# Patient Record
Sex: Female | Born: 1940 | Race: White | Hispanic: No | Marital: Married | Smoking: Former smoker
Health system: Southern US, Community
[De-identification: ages and names within clinical notes are randomized; demographics above are authoritative.]

## PROBLEM LIST (undated history)

## (undated) ENCOUNTER — Emergency Department (HOSPITAL_COMMUNITY): Payer: Medicare Other | Source: Home / Self Care

## (undated) DIAGNOSIS — J449 Chronic obstructive pulmonary disease, unspecified: Secondary | ICD-10-CM

## (undated) DIAGNOSIS — Z9889 Other specified postprocedural states: Secondary | ICD-10-CM

## (undated) DIAGNOSIS — S2239XA Fracture of one rib, unspecified side, initial encounter for closed fracture: Secondary | ICD-10-CM

## (undated) DIAGNOSIS — R112 Nausea with vomiting, unspecified: Secondary | ICD-10-CM

## (undated) DIAGNOSIS — K219 Gastro-esophageal reflux disease without esophagitis: Secondary | ICD-10-CM

## (undated) DIAGNOSIS — G459 Transient cerebral ischemic attack, unspecified: Secondary | ICD-10-CM

## (undated) DIAGNOSIS — I2510 Atherosclerotic heart disease of native coronary artery without angina pectoris: Secondary | ICD-10-CM

## (undated) DIAGNOSIS — S2249XA Multiple fractures of ribs, unspecified side, initial encounter for closed fracture: Secondary | ICD-10-CM

## (undated) DIAGNOSIS — M1990 Unspecified osteoarthritis, unspecified site: Secondary | ICD-10-CM

## (undated) DIAGNOSIS — I255 Ischemic cardiomyopathy: Secondary | ICD-10-CM

## (undated) DIAGNOSIS — IMO0002 Reserved for concepts with insufficient information to code with codable children: Secondary | ICD-10-CM

## (undated) DIAGNOSIS — B029 Zoster without complications: Secondary | ICD-10-CM

## (undated) DIAGNOSIS — M8580 Other specified disorders of bone density and structure, unspecified site: Secondary | ICD-10-CM

## (undated) DIAGNOSIS — I10 Essential (primary) hypertension: Secondary | ICD-10-CM

## (undated) DIAGNOSIS — I4901 Ventricular fibrillation: Secondary | ICD-10-CM

## (undated) DIAGNOSIS — E785 Hyperlipidemia, unspecified: Secondary | ICD-10-CM

## (undated) HISTORY — DX: Reserved for concepts with insufficient information to code with codable children: IMO0002

## (undated) HISTORY — PX: BACK SURGERY: SHX140

## (undated) HISTORY — PX: APPENDECTOMY: SHX54

## (undated) HISTORY — PX: LEG SURGERY: SHX1003

## (undated) HISTORY — DX: Transient cerebral ischemic attack, unspecified: G45.9

## (undated) HISTORY — DX: Atherosclerotic heart disease of native coronary artery without angina pectoris: I25.10

## (undated) HISTORY — PX: ORIF HIP FRACTURE: SHX2125

## (undated) HISTORY — DX: Gastro-esophageal reflux disease without esophagitis: K21.9

## (undated) HISTORY — DX: Zoster without complications: B02.9

## (undated) HISTORY — DX: Other specified disorders of bone density and structure, unspecified site: M85.80

## (undated) HISTORY — PX: TONSILLECTOMY: SHX5217

## (undated) HISTORY — DX: Unspecified osteoarthritis, unspecified site: M19.90

---

## 2003-11-14 LAB — HM COLONOSCOPY: HM Colonoscopy: NORMAL

## 2005-05-13 ENCOUNTER — Other Ambulatory Visit: Admission: RE | Admit: 2005-05-13 | Discharge: 2005-05-13 | Payer: Self-pay | Admitting: Obstetrics and Gynecology

## 2006-05-21 ENCOUNTER — Ambulatory Visit: Payer: Self-pay | Admitting: Family Medicine

## 2006-05-28 ENCOUNTER — Ambulatory Visit: Payer: Self-pay | Admitting: Family Medicine

## 2006-06-16 ENCOUNTER — Ambulatory Visit: Payer: Self-pay | Admitting: Family Medicine

## 2006-06-16 LAB — CONVERTED CEMR LAB
ALT: 20 units/L (ref 0–40)
AST: 19 units/L (ref 0–37)
Albumin: 3.5 g/dL (ref 3.5–5.2)
Alkaline Phosphatase: 67 units/L (ref 39–117)
BUN: 13 mg/dL (ref 6–23)
Basophils Absolute: 0 10*3/uL (ref 0.0–0.1)
Basophils Relative: 0.5 % (ref 0.0–1.0)
Bilirubin, Direct: 0.1 mg/dL (ref 0.0–0.3)
CO2: 33 meq/L — ABNORMAL HIGH (ref 19–32)
Calcium: 8.7 mg/dL (ref 8.4–10.5)
Chloride: 103 meq/L (ref 96–112)
Cholesterol: 203 mg/dL (ref 0–200)
Creatinine, Ser: 0.6 mg/dL (ref 0.4–1.2)
Direct LDL: 116.4 mg/dL
Eosinophils Absolute: 0 10*3/uL (ref 0.0–0.6)
Eosinophils Relative: 1.1 % (ref 0.0–5.0)
GFR calc Af Amer: 129 mL/min
GFR calc non Af Amer: 106 mL/min
Glucose, Bld: 106 mg/dL — ABNORMAL HIGH (ref 70–99)
HCT: 37.1 % (ref 36.0–46.0)
HDL: 71.5 mg/dL (ref >39.0)
Hemoglobin: 13 g/dL (ref 12.0–15.0)
Lymphocytes Relative: 27.8 % (ref 12.0–46.0)
MCHC: 35.2 g/dL (ref 30.0–36.0)
MCV: 97.3 fL (ref 78.0–100.0)
Monocytes Absolute: 0.5 10*3/uL (ref 0.2–0.7)
Monocytes Relative: 10.3 % (ref 3.0–11.0)
Neutro Abs: 2.7 10*3/uL (ref 1.4–7.7)
Neutrophils Relative %: 60.3 % (ref 43.0–77.0)
Platelets: 266 10*3/uL (ref 150–400)
Potassium: 4.8 meq/L (ref 3.5–5.1)
RBC: 3.81 M/uL — ABNORMAL LOW (ref 3.87–5.11)
RDW: 13.8 % (ref 11.5–14.6)
Sodium: 140 meq/L (ref 135–145)
TSH: 1.85 microintl units/mL (ref 0.35–5.50)
Total Bilirubin: 0.7 mg/dL (ref 0.3–1.2)
Total CHOL/HDL Ratio: 2.8
Total Protein: 6.3 g/dL (ref 6.0–8.3)
Triglycerides: 64 mg/dL (ref 0–149)
VLDL: 13 mg/dL (ref 0–40)
WBC: 4.5 10*3/uL (ref 4.5–10.5)

## 2007-03-29 ENCOUNTER — Emergency Department (HOSPITAL_COMMUNITY): Admission: EM | Admit: 2007-03-29 | Discharge: 2007-03-29 | Payer: Self-pay | Admitting: Family Medicine

## 2007-03-29 ENCOUNTER — Telehealth (INDEPENDENT_AMBULATORY_CARE_PROVIDER_SITE_OTHER): Payer: Self-pay | Admitting: *Deleted

## 2007-05-10 ENCOUNTER — Encounter: Admission: RE | Admit: 2007-05-10 | Discharge: 2007-05-10 | Payer: Self-pay | Admitting: Obstetrics and Gynecology

## 2007-09-01 ENCOUNTER — Emergency Department (HOSPITAL_COMMUNITY): Admission: EM | Admit: 2007-09-01 | Discharge: 2007-09-01 | Payer: Self-pay | Admitting: Family Medicine

## 2007-11-21 ENCOUNTER — Ambulatory Visit: Payer: Self-pay | Admitting: Family Medicine

## 2007-11-21 DIAGNOSIS — M949 Disorder of cartilage, unspecified: Secondary | ICD-10-CM

## 2007-11-21 DIAGNOSIS — M899 Disorder of bone, unspecified: Secondary | ICD-10-CM | POA: Insufficient documentation

## 2007-11-21 DIAGNOSIS — M1990 Unspecified osteoarthritis, unspecified site: Secondary | ICD-10-CM | POA: Insufficient documentation

## 2007-11-22 ENCOUNTER — Encounter (INDEPENDENT_AMBULATORY_CARE_PROVIDER_SITE_OTHER): Payer: Self-pay | Admitting: *Deleted

## 2007-11-22 LAB — CONVERTED CEMR LAB
ALT: 16 units/L (ref 0–35)
AST: 17 units/L (ref 0–37)
Albumin: 3.7 g/dL (ref 3.5–5.2)
Alkaline Phosphatase: 65 units/L (ref 39–117)
BUN: 9 mg/dL (ref 6–23)
Basophils Absolute: 0 10*3/uL (ref 0.0–0.1)
Basophils Relative: 0.5 % (ref 0.0–3.0)
Bilirubin, Direct: 0.1 mg/dL (ref 0.0–0.3)
CO2: 32 meq/L (ref 19–32)
Calcium: 8.8 mg/dL (ref 8.4–10.5)
Chloride: 103 meq/L (ref 96–112)
Cholesterol: 236 mg/dL (ref 0–200)
Creatinine, Ser: 0.7 mg/dL (ref 0.4–1.2)
Direct LDL: 134.9 mg/dL
Eosinophils Absolute: 0 10*3/uL (ref 0.0–0.7)
Eosinophils Relative: 0.6 % (ref 0.0–5.0)
GFR calc Af Amer: 107 mL/min
GFR calc non Af Amer: 88 mL/min
Glucose, Bld: 92 mg/dL (ref 70–99)
HCT: 37.5 % (ref 36.0–46.0)
HDL: 94.2 mg/dL (ref >39.0)
Hemoglobin: 12.8 g/dL (ref 12.0–15.0)
Lymphocytes Relative: 24 % (ref 12.0–46.0)
MCHC: 34.3 g/dL (ref 30.0–36.0)
MCV: 98.3 fL (ref 78.0–100.0)
Monocytes Absolute: 0.5 10*3/uL (ref 0.1–1.0)
Monocytes Relative: 8.8 % (ref 3.0–12.0)
Neutro Abs: 3.5 10*3/uL (ref 1.4–7.7)
Neutrophils Relative %: 66.1 % (ref 43.0–77.0)
Platelets: 342 10*3/uL (ref 150–400)
Potassium: 4.5 meq/L (ref 3.5–5.1)
RBC: 3.81 M/uL — ABNORMAL LOW (ref 3.87–5.11)
RDW: 14.4 % (ref 11.5–14.6)
Sodium: 138 meq/L (ref 135–145)
Total Bilirubin: 0.8 mg/dL (ref 0.3–1.2)
Total CHOL/HDL Ratio: 2.5
Total Protein: 7.1 g/dL (ref 6.0–8.3)
Triglycerides: 64 mg/dL (ref 0–149)
VLDL: 13 mg/dL (ref 0–40)
Vit D, 1,25-Dihydroxy: 30 (ref 30–89)
WBC: 5.2 10*3/uL (ref 4.5–10.5)

## 2008-03-09 ENCOUNTER — Ambulatory Visit: Payer: Self-pay | Admitting: Family Medicine

## 2008-03-09 DIAGNOSIS — E559 Vitamin D deficiency, unspecified: Secondary | ICD-10-CM | POA: Insufficient documentation

## 2008-03-09 DIAGNOSIS — K219 Gastro-esophageal reflux disease without esophagitis: Secondary | ICD-10-CM | POA: Insufficient documentation

## 2008-03-09 DIAGNOSIS — B354 Tinea corporis: Secondary | ICD-10-CM | POA: Insufficient documentation

## 2008-03-17 LAB — CONVERTED CEMR LAB: Vit D, 1,25-Dihydroxy: 32 (ref 30–89)

## 2008-03-20 ENCOUNTER — Encounter (INDEPENDENT_AMBULATORY_CARE_PROVIDER_SITE_OTHER): Payer: Self-pay | Admitting: *Deleted

## 2008-03-20 ENCOUNTER — Encounter: Payer: Self-pay | Admitting: Family Medicine

## 2008-04-17 ENCOUNTER — Telehealth (INDEPENDENT_AMBULATORY_CARE_PROVIDER_SITE_OTHER): Payer: Self-pay | Admitting: *Deleted

## 2008-04-18 ENCOUNTER — Ambulatory Visit: Payer: Self-pay | Admitting: Family Medicine

## 2008-04-20 ENCOUNTER — Encounter (INDEPENDENT_AMBULATORY_CARE_PROVIDER_SITE_OTHER): Payer: Self-pay | Admitting: *Deleted

## 2008-04-20 LAB — CONVERTED CEMR LAB: Vit D, 1,25-Dihydroxy: 82 (ref 30–89)

## 2008-05-03 ENCOUNTER — Ambulatory Visit: Payer: Self-pay | Admitting: Family Medicine

## 2008-05-03 DIAGNOSIS — K5289 Other specified noninfective gastroenteritis and colitis: Secondary | ICD-10-CM | POA: Insufficient documentation

## 2008-05-03 LAB — CONVERTED CEMR LAB
Bilirubin Urine: NEGATIVE
Blood in Urine, dipstick: NEGATIVE
Glucose, Urine, Semiquant: NEGATIVE
Ketones, urine, test strip: NEGATIVE
Nitrite: NEGATIVE
Protein, U semiquant: NEGATIVE
Specific Gravity, Urine: <1.005
Urobilinogen, UA: 0.2
WBC Urine, dipstick: NEGATIVE
pH: 5

## 2008-07-10 ENCOUNTER — Ambulatory Visit: Payer: Self-pay | Admitting: Family Medicine

## 2008-07-10 ENCOUNTER — Telehealth: Payer: Self-pay | Admitting: Internal Medicine

## 2008-07-10 DIAGNOSIS — R5383 Other fatigue: Secondary | ICD-10-CM

## 2008-07-10 DIAGNOSIS — R5381 Other malaise: Secondary | ICD-10-CM | POA: Insufficient documentation

## 2008-07-10 DIAGNOSIS — J069 Acute upper respiratory infection, unspecified: Secondary | ICD-10-CM | POA: Insufficient documentation

## 2008-07-10 DIAGNOSIS — R0602 Shortness of breath: Secondary | ICD-10-CM | POA: Insufficient documentation

## 2008-07-11 ENCOUNTER — Encounter: Payer: Self-pay | Admitting: Family Medicine

## 2008-07-11 ENCOUNTER — Telehealth (INDEPENDENT_AMBULATORY_CARE_PROVIDER_SITE_OTHER): Payer: Self-pay | Admitting: *Deleted

## 2008-07-11 ENCOUNTER — Ambulatory Visit: Payer: Self-pay | Admitting: Cardiology

## 2008-07-11 DIAGNOSIS — R1900 Intra-abdominal and pelvic swelling, mass and lump, unspecified site: Secondary | ICD-10-CM | POA: Insufficient documentation

## 2008-07-13 ENCOUNTER — Telehealth (INDEPENDENT_AMBULATORY_CARE_PROVIDER_SITE_OTHER): Payer: Self-pay | Admitting: *Deleted

## 2008-07-13 ENCOUNTER — Encounter: Admission: RE | Admit: 2008-07-13 | Discharge: 2008-07-13 | Payer: Self-pay | Admitting: Family Medicine

## 2008-07-27 LAB — CONVERTED CEMR LAB
ALT: 16 units/L (ref 0–35)
AST: 22 units/L (ref 0–37)
Albumin: 3.7 g/dL (ref 3.5–5.2)
Alkaline Phosphatase: 70 units/L (ref 39–117)
BUN: 17 mg/dL (ref 6–23)
Basophils Absolute: 0.1 10*3/uL (ref 0.0–0.1)
Basophils Relative: 2.1 % (ref 0.0–3.0)
Bilirubin, Direct: 0.1 mg/dL (ref 0.0–0.3)
CO2: 31 meq/L (ref 19–32)
Calcium: 9.2 mg/dL (ref 8.4–10.5)
Chloride: 101 meq/L (ref 96–112)
Creatinine, Ser: 0.6 mg/dL (ref 0.4–1.2)
Eosinophils Absolute: 0 10*3/uL (ref 0.0–0.7)
Eosinophils Relative: 0.8 % (ref 0.0–5.0)
Folate: 8.1 ng/mL
GFR calc non Af Amer: 105.43 mL/min (ref >60)
Glucose, Bld: 150 mg/dL — ABNORMAL HIGH (ref 70–99)
HCT: 34.8 % — ABNORMAL LOW (ref 36.0–46.0)
Hemoglobin: 11.6 g/dL — ABNORMAL LOW (ref 12.0–15.0)
Lymphocytes Relative: 32.7 % (ref 12.0–46.0)
Lymphs Abs: 1.8 10*3/uL (ref 0.7–4.0)
MCHC: 33.2 g/dL (ref 30.0–36.0)
MCV: 87.3 fL (ref 78.0–100.0)
Monocytes Absolute: 0.3 10*3/uL (ref 0.1–1.0)
Monocytes Relative: 6 % (ref 3.0–12.0)
Neutro Abs: 3.4 10*3/uL (ref 1.4–7.7)
Neutrophils Relative %: 58.4 % (ref 43.0–77.0)
Platelets: 339 10*3/uL (ref 150.0–400.0)
Potassium: 4.8 meq/L (ref 3.5–5.1)
RBC: 3.99 M/uL (ref 3.87–5.11)
RDW: 17.3 % — ABNORMAL HIGH (ref 11.5–14.6)
Sodium: 138 meq/L (ref 135–145)
TSH: 1.29 microintl units/mL (ref 0.35–5.50)
Total Bilirubin: 0.7 mg/dL (ref 0.3–1.2)
Total Protein: 6.8 g/dL (ref 6.0–8.3)
Vitamin B-12: 346 pg/mL (ref 211–911)
WBC: 5.6 10*3/uL (ref 4.5–10.5)

## 2008-07-30 ENCOUNTER — Encounter (INDEPENDENT_AMBULATORY_CARE_PROVIDER_SITE_OTHER): Payer: Self-pay | Admitting: *Deleted

## 2008-08-01 ENCOUNTER — Telehealth (INDEPENDENT_AMBULATORY_CARE_PROVIDER_SITE_OTHER): Payer: Self-pay | Admitting: *Deleted

## 2008-08-03 ENCOUNTER — Ambulatory Visit: Payer: Self-pay | Admitting: Family Medicine

## 2008-08-05 LAB — CONVERTED CEMR LAB
Basophils Absolute: 0 10*3/uL (ref 0.0–0.1)
Basophils Relative: 0.4 % (ref 0.0–3.0)
Eosinophils Absolute: 0 10*3/uL (ref 0.0–0.7)
Eosinophils Relative: 0.7 % (ref 0.0–5.0)
Ferritin: 7.8 ng/mL — ABNORMAL LOW (ref 10.0–291.0)
HCT: 33.9 % — ABNORMAL LOW (ref 36.0–46.0)
Hemoglobin: 11.5 g/dL — ABNORMAL LOW (ref 12.0–15.0)
Hgb A1c MFr Bld: 5.8 % (ref 4.6–6.5)
Iron: 52 ug/dL (ref 42–145)
Lymphocytes Relative: 25.8 % (ref 12.0–46.0)
Lymphs Abs: 1.2 10*3/uL (ref 0.7–4.0)
MCHC: 33.9 g/dL (ref 30.0–36.0)
MCV: 87.6 fL (ref 78.0–100.0)
Monocytes Absolute: 0.4 10*3/uL (ref 0.1–1.0)
Monocytes Relative: 8.5 % (ref 3.0–12.0)
Neutro Abs: 3 10*3/uL (ref 1.4–7.7)
Neutrophils Relative %: 64.6 % (ref 43.0–77.0)
Platelets: 268 10*3/uL (ref 150.0–400.0)
RBC: 3.87 M/uL (ref 3.87–5.11)
RDW: 16.7 % — ABNORMAL HIGH (ref 11.5–14.6)
Saturation Ratios: 12.6 % — ABNORMAL LOW (ref 20.0–50.0)
Transferrin: 295.5 mg/dL (ref 212.0–360.0)
WBC: 4.6 10*3/uL (ref 4.5–10.5)

## 2008-08-07 ENCOUNTER — Encounter (INDEPENDENT_AMBULATORY_CARE_PROVIDER_SITE_OTHER): Payer: Self-pay | Admitting: *Deleted

## 2008-08-30 ENCOUNTER — Telehealth (INDEPENDENT_AMBULATORY_CARE_PROVIDER_SITE_OTHER): Payer: Self-pay | Admitting: *Deleted

## 2008-08-30 ENCOUNTER — Telehealth: Payer: Self-pay | Admitting: Family Medicine

## 2008-08-30 ENCOUNTER — Ambulatory Visit: Payer: Self-pay | Admitting: Family Medicine

## 2008-08-30 ENCOUNTER — Encounter (INDEPENDENT_AMBULATORY_CARE_PROVIDER_SITE_OTHER): Payer: Self-pay | Admitting: *Deleted

## 2008-08-30 DIAGNOSIS — IMO0002 Reserved for concepts with insufficient information to code with codable children: Secondary | ICD-10-CM | POA: Insufficient documentation

## 2008-08-30 DIAGNOSIS — D539 Nutritional anemia, unspecified: Secondary | ICD-10-CM | POA: Insufficient documentation

## 2008-08-30 LAB — CONVERTED CEMR LAB
Basophils Absolute: 0 10*3/uL (ref 0.0–0.1)
Basophils Relative: 0.1 % (ref 0.0–3.0)
Eosinophils Absolute: 0 10*3/uL (ref 0.0–0.7)
Eosinophils Relative: 0.5 % (ref 0.0–5.0)
HCT: 35.5 % — ABNORMAL LOW (ref 36.0–46.0)
Hemoglobin: 12.2 g/dL (ref 12.0–15.0)
Lymphocytes Relative: 20.6 % (ref 12.0–46.0)
Lymphs Abs: 1.1 10*3/uL (ref 0.7–4.0)
MCHC: 34.4 g/dL (ref 30.0–36.0)
MCV: 89 fL (ref 78.0–100.0)
Monocytes Absolute: 0.5 10*3/uL (ref 0.1–1.0)
Monocytes Relative: 8.6 % (ref 3.0–12.0)
Neutro Abs: 3.7 10*3/uL (ref 1.4–7.7)
Neutrophils Relative %: 70.2 % (ref 43.0–77.0)
Platelets: 268 10*3/uL (ref 150.0–400.0)
RBC: 3.98 M/uL (ref 3.87–5.11)
RDW: 17 % — ABNORMAL HIGH (ref 11.5–14.6)
WBC: 5.3 10*3/uL (ref 4.5–10.5)

## 2008-08-31 ENCOUNTER — Encounter: Admission: RE | Admit: 2008-08-31 | Discharge: 2008-08-31 | Payer: Self-pay | Admitting: Family Medicine

## 2008-08-31 ENCOUNTER — Encounter (INDEPENDENT_AMBULATORY_CARE_PROVIDER_SITE_OTHER): Payer: Self-pay | Admitting: *Deleted

## 2008-08-31 DIAGNOSIS — M48061 Spinal stenosis, lumbar region without neurogenic claudication: Secondary | ICD-10-CM | POA: Insufficient documentation

## 2008-08-31 LAB — CONVERTED CEMR LAB: Vit D, 25-Hydroxy: 48 ng/mL (ref 30–89)

## 2008-09-04 ENCOUNTER — Telehealth (INDEPENDENT_AMBULATORY_CARE_PROVIDER_SITE_OTHER): Payer: Self-pay | Admitting: *Deleted

## 2008-10-22 ENCOUNTER — Telehealth: Payer: Self-pay | Admitting: Family Medicine

## 2008-10-25 ENCOUNTER — Ambulatory Visit: Payer: Self-pay | Admitting: Family Medicine

## 2008-10-30 ENCOUNTER — Encounter: Payer: Self-pay | Admitting: Family Medicine

## 2008-12-17 ENCOUNTER — Telehealth: Payer: Self-pay | Admitting: Family Medicine

## 2008-12-18 ENCOUNTER — Ambulatory Visit: Payer: Self-pay | Admitting: Family Medicine

## 2008-12-19 ENCOUNTER — Telehealth: Payer: Self-pay | Admitting: Family Medicine

## 2008-12-20 ENCOUNTER — Emergency Department (HOSPITAL_COMMUNITY): Admission: EM | Admit: 2008-12-20 | Discharge: 2008-12-20 | Payer: Self-pay | Admitting: Emergency Medicine

## 2008-12-20 ENCOUNTER — Encounter: Payer: Self-pay | Admitting: Family Medicine

## 2008-12-21 ENCOUNTER — Telehealth: Payer: Self-pay | Admitting: Family Medicine

## 2008-12-21 DIAGNOSIS — S32009A Unspecified fracture of unspecified lumbar vertebra, initial encounter for closed fracture: Secondary | ICD-10-CM | POA: Insufficient documentation

## 2008-12-25 ENCOUNTER — Encounter: Admission: RE | Admit: 2008-12-25 | Discharge: 2008-12-25 | Payer: Self-pay | Admitting: Family Medicine

## 2008-12-26 ENCOUNTER — Telehealth: Payer: Self-pay | Admitting: Family Medicine

## 2009-01-04 ENCOUNTER — Ambulatory Visit (HOSPITAL_COMMUNITY): Admission: RE | Admit: 2009-01-04 | Discharge: 2009-01-05 | Payer: Self-pay | Admitting: Neurosurgery

## 2009-04-10 ENCOUNTER — Ambulatory Visit: Payer: Self-pay | Admitting: Family

## 2009-04-10 DIAGNOSIS — J329 Chronic sinusitis, unspecified: Secondary | ICD-10-CM | POA: Insufficient documentation

## 2009-04-23 ENCOUNTER — Encounter: Admission: RE | Admit: 2009-04-23 | Discharge: 2009-04-23 | Payer: Self-pay | Admitting: Neurosurgery

## 2009-04-30 ENCOUNTER — Telehealth (INDEPENDENT_AMBULATORY_CARE_PROVIDER_SITE_OTHER): Payer: Self-pay | Admitting: *Deleted

## 2009-05-30 ENCOUNTER — Inpatient Hospital Stay (HOSPITAL_COMMUNITY): Admission: RE | Admit: 2009-05-30 | Discharge: 2009-05-31 | Payer: Self-pay | Admitting: Neurosurgery

## 2009-07-16 ENCOUNTER — Encounter: Payer: Self-pay | Admitting: Family Medicine

## 2009-08-14 ENCOUNTER — Ambulatory Visit: Payer: Self-pay | Admitting: Cardiology

## 2009-08-14 ENCOUNTER — Inpatient Hospital Stay (HOSPITAL_COMMUNITY): Admission: EM | Admit: 2009-08-14 | Discharge: 2009-08-19 | Payer: Self-pay | Admitting: Emergency Medicine

## 2009-08-14 ENCOUNTER — Telehealth: Payer: Self-pay | Admitting: Family Medicine

## 2009-08-14 DIAGNOSIS — I2510 Atherosclerotic heart disease of native coronary artery without angina pectoris: Secondary | ICD-10-CM

## 2009-08-14 DIAGNOSIS — I4901 Ventricular fibrillation: Secondary | ICD-10-CM | POA: Insufficient documentation

## 2009-08-14 HISTORY — DX: Atherosclerotic heart disease of native coronary artery without angina pectoris: I25.10

## 2009-08-16 ENCOUNTER — Encounter: Payer: Self-pay | Admitting: Cardiology

## 2009-08-22 ENCOUNTER — Inpatient Hospital Stay (HOSPITAL_COMMUNITY): Admission: EM | Admit: 2009-08-22 | Discharge: 2009-08-23 | Payer: Self-pay | Admitting: Emergency Medicine

## 2009-08-22 ENCOUNTER — Telehealth: Payer: Self-pay | Admitting: Internal Medicine

## 2009-08-22 ENCOUNTER — Ambulatory Visit: Payer: Self-pay | Admitting: Cardiology

## 2009-08-23 ENCOUNTER — Encounter: Payer: Self-pay | Admitting: Cardiology

## 2009-08-27 ENCOUNTER — Ambulatory Visit: Payer: Self-pay | Admitting: Family Medicine

## 2009-08-27 DIAGNOSIS — I219 Acute myocardial infarction, unspecified: Secondary | ICD-10-CM | POA: Insufficient documentation

## 2009-08-27 DIAGNOSIS — E785 Hyperlipidemia, unspecified: Secondary | ICD-10-CM | POA: Insufficient documentation

## 2009-08-27 DIAGNOSIS — E876 Hypokalemia: Secondary | ICD-10-CM | POA: Insufficient documentation

## 2009-08-27 DIAGNOSIS — I2510 Atherosclerotic heart disease of native coronary artery without angina pectoris: Secondary | ICD-10-CM | POA: Insufficient documentation

## 2009-08-29 ENCOUNTER — Telehealth: Payer: Self-pay | Admitting: Cardiology

## 2009-08-30 LAB — CONVERTED CEMR LAB
BUN: 13 mg/dL (ref 6–23)
CO2: 30 meq/L (ref 19–32)
Calcium: 9.5 mg/dL (ref 8.4–10.5)
Chloride: 91 meq/L — ABNORMAL LOW (ref 96–112)
Creatinine, Ser: 0.6 mg/dL (ref 0.4–1.2)
GFR calc non Af Amer: 103.09 mL/min (ref >60)
Glucose, Bld: 94 mg/dL (ref 70–99)
Potassium: 5 meq/L (ref 3.5–5.1)
Sodium: 130 meq/L — ABNORMAL LOW (ref 135–145)

## 2009-09-04 ENCOUNTER — Ambulatory Visit: Payer: Self-pay | Admitting: Cardiology

## 2009-09-04 DIAGNOSIS — E871 Hypo-osmolality and hyponatremia: Secondary | ICD-10-CM | POA: Insufficient documentation

## 2009-09-05 ENCOUNTER — Encounter (HOSPITAL_COMMUNITY): Admission: RE | Admit: 2009-09-05 | Discharge: 2009-12-04 | Payer: Self-pay | Admitting: Cardiology

## 2009-09-05 LAB — CONVERTED CEMR LAB
BUN: 9 mg/dL (ref 6–23)
Basophils Absolute: 0 10*3/uL (ref 0.0–0.1)
Basophils Relative: 0.4 % (ref 0.0–3.0)
CO2: 32 meq/L (ref 19–32)
Calcium: 9 mg/dL (ref 8.4–10.5)
Chloride: 93 meq/L — ABNORMAL LOW (ref 96–112)
Creatinine, Ser: 0.6 mg/dL (ref 0.4–1.2)
Eosinophils Absolute: 0 10*3/uL (ref 0.0–0.7)
Eosinophils Relative: 0.4 % (ref 0.0–5.0)
GFR calc non Af Amer: 116.17 mL/min (ref >60)
Glucose, Bld: 83 mg/dL (ref 70–99)
HCT: 35.7 % — ABNORMAL LOW (ref 36.0–46.0)
Hemoglobin: 12.6 g/dL (ref 12.0–15.0)
Lymphocytes Relative: 21.1 % (ref 12.0–46.0)
Lymphs Abs: 1.3 10*3/uL (ref 0.7–4.0)
MCHC: 35.2 g/dL (ref 30.0–36.0)
MCV: 101.6 fL — ABNORMAL HIGH (ref 78.0–100.0)
Monocytes Absolute: 0.5 10*3/uL (ref 0.1–1.0)
Monocytes Relative: 8.8 % (ref 3.0–12.0)
Neutro Abs: 4.1 10*3/uL (ref 1.4–7.7)
Neutrophils Relative %: 69.3 % (ref 43.0–77.0)
Platelets: 339 10*3/uL (ref 150.0–400.0)
Potassium: 5.5 meq/L — ABNORMAL HIGH (ref 3.5–5.1)
RBC: 3.51 M/uL — ABNORMAL LOW (ref 3.87–5.11)
RDW: 13.6 % (ref 11.5–14.6)
Sodium: 132 meq/L — ABNORMAL LOW (ref 135–145)
WBC: 6 10*3/uL (ref 4.5–10.5)

## 2009-09-09 ENCOUNTER — Ambulatory Visit: Payer: Self-pay | Admitting: Cardiology

## 2009-09-10 ENCOUNTER — Encounter: Payer: Self-pay | Admitting: Family Medicine

## 2009-09-10 LAB — CONVERTED CEMR LAB
BUN: 12 mg/dL (ref 6–23)
CO2: 32 meq/L (ref 19–32)
Calcium: 9.3 mg/dL (ref 8.4–10.5)
Chloride: 95 meq/L — ABNORMAL LOW (ref 96–112)
Creatinine, Ser: 0.7 mg/dL (ref 0.4–1.2)
GFR calc non Af Amer: 92.51 mL/min (ref >60)
Glucose, Bld: 83 mg/dL (ref 70–99)
Potassium: 5 meq/L (ref 3.5–5.1)
Sodium: 134 meq/L — ABNORMAL LOW (ref 135–145)

## 2009-09-16 ENCOUNTER — Ambulatory Visit: Payer: Self-pay | Admitting: Cardiology

## 2009-09-19 ENCOUNTER — Encounter: Payer: Self-pay | Admitting: Cardiology

## 2009-09-23 ENCOUNTER — Telehealth: Payer: Self-pay | Admitting: Cardiology

## 2009-09-30 LAB — CONVERTED CEMR LAB
BUN: 10 mg/dL (ref 6–23)
CO2: 28 meq/L (ref 19–32)
Calcium: 9.1 mg/dL (ref 8.4–10.5)
Chloride: 100 meq/L (ref 96–112)
Creatinine, Ser: 0.7 mg/dL (ref 0.4–1.2)
GFR calc non Af Amer: 92.5 mL/min (ref >60)
Glucose, Bld: 80 mg/dL (ref 70–99)
Potassium: 4.9 meq/L (ref 3.5–5.1)
Sodium: 137 meq/L (ref 135–145)

## 2009-10-02 ENCOUNTER — Encounter: Admission: RE | Admit: 2009-10-02 | Discharge: 2009-10-02 | Payer: Self-pay | Admitting: Neurosurgery

## 2009-10-03 ENCOUNTER — Encounter: Payer: Self-pay | Admitting: Family Medicine

## 2009-10-03 ENCOUNTER — Encounter: Payer: Self-pay | Admitting: Cardiology

## 2009-10-09 ENCOUNTER — Encounter: Payer: Self-pay | Admitting: Cardiology

## 2009-10-09 ENCOUNTER — Ambulatory Visit (HOSPITAL_COMMUNITY): Admission: RE | Admit: 2009-10-09 | Discharge: 2009-10-09 | Payer: Self-pay | Admitting: Cardiology

## 2009-10-09 ENCOUNTER — Ambulatory Visit: Payer: Self-pay | Admitting: Cardiology

## 2009-10-09 ENCOUNTER — Ambulatory Visit: Payer: Self-pay

## 2009-10-10 ENCOUNTER — Encounter: Payer: Self-pay | Admitting: Cardiology

## 2009-10-10 LAB — CONVERTED CEMR LAB
ALT: 15 units/L (ref 0–35)
AST: 21 units/L (ref 0–37)
Albumin: 3.7 g/dL (ref 3.5–5.2)
Alkaline Phosphatase: 80 units/L (ref 39–117)
BUN: 10 mg/dL (ref 6–23)
Bilirubin, Direct: 0 mg/dL (ref 0.0–0.3)
CO2: 27 meq/L (ref 19–32)
Calcium: 8.6 mg/dL (ref 8.4–10.5)
Chloride: 100 meq/L (ref 96–112)
Cholesterol: 143 mg/dL (ref 0–200)
Creatinine, Ser: 0.6 mg/dL (ref 0.4–1.2)
GFR calc non Af Amer: 109.23 mL/min (ref >60)
Glucose, Bld: 101 mg/dL — ABNORMAL HIGH (ref 70–99)
HDL: 82.9 mg/dL (ref >39.00)
LDL Cholesterol: 42 mg/dL (ref 0–99)
Potassium: 4.4 meq/L (ref 3.5–5.1)
Sodium: 134 meq/L — ABNORMAL LOW (ref 135–145)
Total Bilirubin: 0.5 mg/dL (ref 0.3–1.2)
Total CHOL/HDL Ratio: 2
Total Protein: 6.7 g/dL (ref 6.0–8.3)
Triglycerides: 93 mg/dL (ref 0.0–149.0)
VLDL: 18.6 mg/dL (ref 0.0–40.0)

## 2009-10-16 ENCOUNTER — Encounter: Payer: Self-pay | Admitting: Family Medicine

## 2009-10-16 LAB — HM MAMMOGRAPHY: HM Mammogram: NORMAL

## 2009-10-16 LAB — CONVERTED CEMR LAB: Pap Smear: NORMAL

## 2009-10-16 LAB — HM PAP SMEAR: HM Pap smear: NORMAL

## 2009-10-18 ENCOUNTER — Telehealth: Payer: Self-pay | Admitting: Cardiology

## 2009-12-02 ENCOUNTER — Telehealth: Payer: Self-pay | Admitting: Cardiology

## 2009-12-05 ENCOUNTER — Encounter (HOSPITAL_COMMUNITY): Admission: RE | Admit: 2009-12-05 | Discharge: 2010-01-03 | Payer: Self-pay | Admitting: Cardiology

## 2009-12-14 ENCOUNTER — Inpatient Hospital Stay (HOSPITAL_COMMUNITY): Admission: EM | Admit: 2009-12-14 | Discharge: 2009-12-16 | Payer: Self-pay | Admitting: Emergency Medicine

## 2009-12-14 ENCOUNTER — Emergency Department (HOSPITAL_COMMUNITY)
Admission: EM | Admit: 2009-12-14 | Discharge: 2009-12-14 | Disposition: A | Discharge status: Other Acute Inpatient Hospital | Payer: Self-pay | Source: Home / Self Care | Admitting: Emergency Medicine

## 2009-12-14 ENCOUNTER — Ambulatory Visit: Payer: Self-pay | Admitting: Internal Medicine

## 2009-12-17 ENCOUNTER — Encounter: Payer: Self-pay | Admitting: Cardiology

## 2009-12-18 ENCOUNTER — Telehealth: Payer: Self-pay | Admitting: Cardiology

## 2009-12-23 ENCOUNTER — Telehealth: Payer: Self-pay | Admitting: Cardiology

## 2009-12-24 ENCOUNTER — Telehealth (INDEPENDENT_AMBULATORY_CARE_PROVIDER_SITE_OTHER): Payer: Self-pay | Admitting: *Deleted

## 2009-12-25 ENCOUNTER — Encounter: Payer: Self-pay | Admitting: Cardiology

## 2009-12-25 ENCOUNTER — Ambulatory Visit: Payer: Self-pay

## 2009-12-25 ENCOUNTER — Encounter (HOSPITAL_COMMUNITY): Admission: RE | Admit: 2009-12-25 | Discharge: 2010-03-07 | Payer: Self-pay | Admitting: Cardiology

## 2009-12-25 ENCOUNTER — Ambulatory Visit: Payer: Self-pay | Admitting: Cardiology

## 2009-12-30 ENCOUNTER — Telehealth: Payer: Self-pay | Admitting: Cardiology

## 2010-01-21 ENCOUNTER — Ambulatory Visit: Payer: Self-pay | Admitting: Cardiology

## 2010-02-25 ENCOUNTER — Encounter: Payer: Self-pay | Admitting: Family Medicine

## 2010-02-25 ENCOUNTER — Encounter: Payer: Self-pay | Admitting: Cardiology

## 2010-03-05 ENCOUNTER — Ambulatory Visit: Payer: Self-pay | Admitting: Cardiology

## 2010-03-11 LAB — CONVERTED CEMR LAB
ALT: 19 U/L (ref 0–35)
AST: 25 U/L (ref 0–37)
Albumin: 3.7 g/dL (ref 3.5–5.2)
Alkaline Phosphatase: 76 U/L (ref 39–117)
BUN: 16 mg/dL (ref 6–23)
Bilirubin, Direct: 0.1 mg/dL (ref 0.0–0.3)
CO2: 31 meq/L (ref 19–32)
Calcium: 9 mg/dL (ref 8.4–10.5)
Chloride: 99 meq/L (ref 96–112)
Cholesterol: 166 mg/dL (ref 0–200)
Creatinine, Ser: 0.7 mg/dL (ref 0.4–1.2)
GFR calc non Af Amer: 85.01 mL/min (ref >60)
Glucose, Bld: 70 mg/dL (ref 70–99)
HDL: 92.7 mg/dL (ref >39.00)
LDL Cholesterol: 62 mg/dL (ref 0–99)
Potassium: 4.6 meq/L (ref 3.5–5.1)
Sodium: 137 meq/L (ref 135–145)
Total Bilirubin: 0.4 mg/dL (ref 0.3–1.2)
Total CHOL/HDL Ratio: 2
Total Protein: 6.7 g/dL (ref 6.0–8.3)
Triglycerides: 59 mg/dL (ref 0.0–149.0)
VLDL: 11.8 mg/dL (ref 0.0–40.0)

## 2010-03-25 ENCOUNTER — Ambulatory Visit: Payer: Self-pay | Admitting: Cardiology

## 2010-04-10 ENCOUNTER — Inpatient Hospital Stay (HOSPITAL_COMMUNITY)
Admission: EM | Admit: 2010-04-10 | Discharge: 2010-04-11 | Payer: Self-pay | Source: Home / Self Care | Attending: Cardiology | Admitting: Cardiology

## 2010-04-10 LAB — DIFFERENTIAL
Basophils Absolute: 0 10*3/uL (ref 0.0–0.1)
Basophils Relative: 0 % (ref 0–1)
Eosinophils Absolute: 0 10*3/uL (ref 0.0–0.7)
Eosinophils Relative: 0 % (ref 0–5)
Lymphocytes Relative: 20 % (ref 12–46)
Lymphs Abs: 1.1 10*3/uL (ref 0.7–4.0)
Monocytes Absolute: 0.4 10*3/uL (ref 0.1–1.0)
Monocytes Relative: 7 % (ref 3–12)
Neutro Abs: 3.8 10*3/uL (ref 1.7–7.7)
Neutrophils Relative %: 72 % (ref 43–77)

## 2010-04-10 LAB — PROTIME-INR
INR: 0.94 (ref 0.00–1.49)
Prothrombin Time: 12.8 seconds (ref 11.6–15.2)

## 2010-04-10 LAB — BASIC METABOLIC PANEL
BUN: 7 mg/dL (ref 6–23)
CO2: 28 mEq/L (ref 19–32)
Calcium: 8.9 mg/dL (ref 8.4–10.5)
Chloride: 101 mEq/L (ref 96–112)
Creatinine, Ser: 0.65 mg/dL (ref 0.4–1.2)
GFR calc Af Amer: >60 mL/min (ref >60)
GFR calc non Af Amer: >60 mL/min (ref >60)
Glucose, Bld: 105 mg/dL — ABNORMAL HIGH (ref 70–99)
Potassium: 4.4 mEq/L (ref 3.5–5.1)
Sodium: 136 mEq/L (ref 135–145)

## 2010-04-10 LAB — CBC
HCT: 39.5 % (ref 36.0–46.0)
Hemoglobin: 13.5 g/dL (ref 12.0–15.0)
MCH: 33.7 pg (ref 26.0–34.0)
MCHC: 34.2 g/dL (ref 30.0–36.0)
MCV: 98.5 fL (ref 78.0–100.0)
Platelets: 231 10*3/uL (ref 150–400)
RBC: 4.01 MIL/uL (ref 3.87–5.11)
RDW: 12.7 % (ref 11.5–15.5)
WBC: 5.3 10*3/uL (ref 4.0–10.5)

## 2010-04-10 LAB — MRSA PCR SCREENING: MRSA by PCR: NEGATIVE

## 2010-04-10 LAB — CARDIAC PANEL(CRET KIN+CKTOT+MB+TROPI)
CK, MB: 2.6 ng/mL (ref 0.3–4.0)
CK, MB: 2.2 ng/mL (ref 0.3–4.0)
Relative Index: INVALID (ref 0.0–2.5)
Relative Index: INVALID (ref 0.0–2.5)
Total CK: 82 U/L (ref 7–177)
Total CK: 65 U/L (ref 7–177)
Troponin I: 0.02 ng/mL (ref 0.00–0.06)
Troponin I: 0.01 ng/mL (ref 0.00–0.06)

## 2010-04-10 LAB — POCT CARDIAC MARKERS
CKMB, poc: <1 ng/mL — ABNORMAL LOW (ref 1.0–8.0)
Myoglobin, poc: 52.9 ng/mL (ref 12–200)
Troponin i, poc: <0.05 ng/mL (ref 0.00–0.09)

## 2010-04-10 LAB — BRAIN NATRIURETIC PEPTIDE: Pro B Natriuretic peptide (BNP): 42 pg/mL (ref 0.0–100.0)

## 2010-04-10 LAB — D-DIMER, QUANTITATIVE: D-Dimer, Quant: 0.35 ug/mL-FEU (ref 0.00–0.48)

## 2010-04-11 LAB — CARDIAC PANEL(CRET KIN+CKTOT+MB+TROPI)
CK, MB: 1.7 ng/mL (ref 0.3–4.0)
Relative Index: INVALID (ref 0.0–2.5)
Total CK: 65 U/L (ref 7–177)
Troponin I: 0.01 ng/mL (ref 0.00–0.06)

## 2010-04-11 LAB — BASIC METABOLIC PANEL
BUN: 7 mg/dL (ref 6–23)
CO2: 26 mEq/L (ref 19–32)
Calcium: 8.7 mg/dL (ref 8.4–10.5)
Chloride: 100 mEq/L (ref 96–112)
Creatinine, Ser: 0.65 mg/dL (ref 0.4–1.2)
GFR calc Af Amer: >60 mL/min (ref >60)
GFR calc non Af Amer: >60 mL/min (ref >60)
Glucose, Bld: 109 mg/dL — ABNORMAL HIGH (ref 70–99)
Potassium: 4.4 mEq/L (ref 3.5–5.1)
Sodium: 133 mEq/L — ABNORMAL LOW (ref 135–145)

## 2010-04-11 LAB — CBC
HCT: 40.2 % (ref 36.0–46.0)
Hemoglobin: 13.5 g/dL (ref 12.0–15.0)
MCH: 33.8 pg (ref 26.0–34.0)
MCHC: 33.6 g/dL (ref 30.0–36.0)
MCV: 100.8 fL — ABNORMAL HIGH (ref 78.0–100.0)
Platelets: 234 10*3/uL (ref 150–400)
RBC: 3.99 MIL/uL (ref 3.87–5.11)
RDW: 12.9 % (ref 11.5–15.5)
WBC: 4.7 10*3/uL (ref 4.0–10.5)

## 2010-04-13 ENCOUNTER — Encounter: Payer: Self-pay | Admitting: Cardiology

## 2010-04-13 ENCOUNTER — Inpatient Hospital Stay (HOSPITAL_COMMUNITY)
Admission: EM | Admit: 2010-04-13 | Discharge: 2010-04-15 | Payer: Self-pay | Source: Home / Self Care | Attending: Cardiology | Admitting: Cardiology

## 2010-04-14 ENCOUNTER — Encounter: Payer: Self-pay | Admitting: Internal Medicine

## 2010-04-21 LAB — BASIC METABOLIC PANEL
BUN: 9 mg/dL (ref 6–23)
BUN: 10 mg/dL (ref 6–23)
CO2: 27 mEq/L (ref 19–32)
CO2: 29 mEq/L (ref 19–32)
Calcium: 9.2 mg/dL (ref 8.4–10.5)
Calcium: 8.9 mg/dL (ref 8.4–10.5)
Chloride: 99 mEq/L (ref 96–112)
Chloride: 101 mEq/L (ref 96–112)
Creatinine, Ser: 0.69 mg/dL (ref 0.4–1.2)
Creatinine, Ser: 0.7 mg/dL (ref 0.4–1.2)
GFR calc Af Amer: >60 mL/min (ref >60)
GFR calc Af Amer: >60 mL/min (ref >60)
GFR calc non Af Amer: >60 mL/min (ref >60)
GFR calc non Af Amer: >60 mL/min (ref >60)
Glucose, Bld: 140 mg/dL — ABNORMAL HIGH (ref 70–99)
Glucose, Bld: 120 mg/dL — ABNORMAL HIGH (ref 70–99)
Potassium: 4.5 mEq/L (ref 3.5–5.1)
Potassium: 4 mEq/L (ref 3.5–5.1)
Sodium: 134 mEq/L — ABNORMAL LOW (ref 135–145)
Sodium: 137 mEq/L (ref 135–145)

## 2010-04-21 LAB — CBC
HCT: 36.7 % (ref 36.0–46.0)
HCT: 35.8 % — ABNORMAL LOW (ref 36.0–46.0)
Hemoglobin: 12.2 g/dL (ref 12.0–15.0)
Hemoglobin: 12.3 g/dL (ref 12.0–15.0)
MCH: 33.1 pg (ref 26.0–34.0)
MCH: 34.3 pg — ABNORMAL HIGH (ref 26.0–34.0)
MCHC: 33.2 g/dL (ref 30.0–36.0)
MCHC: 34.4 g/dL (ref 30.0–36.0)
MCV: 99.5 fL (ref 78.0–100.0)
MCV: 99.7 fL (ref 78.0–100.0)
Platelets: 222 10*3/uL (ref 150–400)
Platelets: 221 10*3/uL (ref 150–400)
RBC: 3.69 MIL/uL — ABNORMAL LOW (ref 3.87–5.11)
RBC: 3.59 MIL/uL — ABNORMAL LOW (ref 3.87–5.11)
RDW: 12.6 % (ref 11.5–15.5)
RDW: 12.6 % (ref 11.5–15.5)
WBC: 5.8 10*3/uL (ref 4.0–10.5)
WBC: 5.2 10*3/uL (ref 4.0–10.5)

## 2010-04-21 LAB — CARDIAC PANEL(CRET KIN+CKTOT+MB+TROPI)
CK, MB: 1.5 ng/mL (ref 0.3–4.0)
CK, MB: 1.2 ng/mL (ref 0.3–4.0)
Relative Index: INVALID (ref 0.0–2.5)
Relative Index: INVALID (ref 0.0–2.5)
Total CK: 55 U/L (ref 7–177)
Total CK: 37 U/L (ref 7–177)
Troponin I: 0.03 ng/mL (ref 0.00–0.06)
Troponin I: 0.01 ng/mL (ref 0.00–0.06)

## 2010-04-21 LAB — HEPATIC FUNCTION PANEL
ALT: 17 U/L (ref 0–35)
AST: 23 U/L (ref 0–37)
Albumin: 3.3 g/dL — ABNORMAL LOW (ref 3.5–5.2)
Alkaline Phosphatase: 67 U/L (ref 39–117)
Bilirubin, Direct: 0.1 mg/dL (ref 0.0–0.3)
Indirect Bilirubin: 0.5 mg/dL (ref 0.3–0.9)
Total Bilirubin: 0.6 mg/dL (ref 0.3–1.2)
Total Protein: 6.2 g/dL (ref 6.0–8.3)

## 2010-04-21 LAB — AMYLASE: Amylase: 51 U/L (ref 0–105)

## 2010-04-21 LAB — CK TOTAL AND CKMB (NOT AT ARMC)
CK, MB: 1.7 ng/mL (ref 0.3–4.0)
Relative Index: INVALID (ref 0.0–2.5)
Total CK: 57 U/L (ref 7–177)

## 2010-04-21 LAB — LIPASE, BLOOD: Lipase: 26 U/L (ref 11–59)

## 2010-04-21 LAB — TROPONIN I: Troponin I: 0.01 ng/mL (ref 0.00–0.06)

## 2010-04-24 ENCOUNTER — Ambulatory Visit
Admission: RE | Admit: 2010-04-24 | Discharge: 2010-04-24 | Payer: Self-pay | Source: Home / Self Care | Attending: Physician Assistant | Admitting: Physician Assistant

## 2010-04-24 DIAGNOSIS — IMO0002 Reserved for concepts with insufficient information to code with codable children: Secondary | ICD-10-CM | POA: Insufficient documentation

## 2010-04-25 NOTE — H&P (Signed)
[name]                 ACCOUNT NO.:  [id number]  MEDICAL RECORD NO.:  [id number]          PATIENT TYPE:  INP  LOCATION:  2920                         FACILITY:  [facility]  PHYSICIAN:  Jesse Sans. Juanluis Guastella, MD, FACCDATE OF BIRTH:  1940-12-23  DATE OF ADMISSION:  04/10/2010 DATE OF DISCHARGE:                             HISTORY & PHYSICAL   CHIEF COMPLAINT:  Pressure and shortness of breath in my chest.  HISTORY OF PRESENT ILLNESS:  [name] is a very pleasant 69 year old lady with known coronary artery disease, status post acute anterior [name] infarct with cardiac arrest in the ED in May 2011.  [name] was treated with a bare-metal stent to the LAD.  EF was 45%.  The followup echo in July 2011 showed EF of 60%.  [name] began to have substernal chest pressure consistent with [name] angina this morning about 7:15.  [name] took a series of 3 nitroglycerin without relief.  [name] called EMS at about 9 o'clock.  An EKG at 9:15 showed some ST-segment depression nondiagnostic in the inferior leads. [name] has old anterior [name] changes.  In the ED, [name] received another nitroglycerin and IV nitroglycerin drip as well as a 4000-unit bolus of heparin.  There was no relief of discomfort.  [name] was taken to the Cath Lab.  I have discussed this with Dr. Riley Campbell.  [name] last catheterization was in September 2011 at which time [name] had a patent stent to the LAD and about a 75% diagonal, ramus.  It was decided to treat [name] medically.  [name] was having exertional symptoms at that time.  [name] past medical history is significant for the above: 1. Coronary artery disease. 2. Hyperlipidemia. 3. Gastroesophageal reflux. 4. Osteoarthritis. 5. History of T9 compression fracture. 6. Osteopenia.  MEDICATIONS AT HOME: 1. Vitamin D 2000 units a day. 2. Multivitamin daily. 3. Caltrate 600 mg a day. 4. Aspirin 81 mg a day. 5. Nitroglycerin p.r.n. for chest pain. 6. Pantoprazole 40 mg a day. 7. Crestor 20 mg a  day. 8. Tylenol p.r.n. 9. Plavix 75 mg a day.  PAST SURGICAL HISTORY:  Appendectomy, tonsillectomy, back surgery, and hip ORIF.  ALLERGIES:  [name] is intolerant of SULFA and CODEINE.  SOCIAL HISTORY:  [name] is married.  [name] lives with [name] in [place].  [name] is retired from [employer] .  [name] is an ex-smoker.  FAMILY HISTORY:  Insignificant for premature coronary artery disease, hypertension, or diabetes.  REVIEW OF SYSTEMS:  Other than the history of present illness is negative.  [name] has had no nausea, vomiting, diaphoresis, abdominal pain, or diarrhea.  PHYSICAL EXAMINATION:  GENERAL:  [name] is extremely pleasant, but uncomfortable white female. VITAL SIGNS:  [name] blood pressure was 112/69.  Pulse was 76, in sinus rhythm on telemetry.  Sats 100% on room air.  Temperature is 97.6. HEENT:  Normocephalic and atraumatic.  PERRLA.  Extraocular movements are intact.  [name] wears glasses.  Facial symmetry is normal.  Dentition is satisfactory. NECK:  Carotid upstrokes are equal bilaterally without bruits.  No JVD. Supple.  No thyromegaly.  Trachea is midline. CHEST:  PMI is nondisplaced.  Normal S1 and S2.  No murmur.  No carotid bruits. LUNGS:  Clear to auscultation and percussion. ABDOMEN:  Soft.  Good bowel sounds.  No midline bruit.  No obvious organomegaly. EXTREMITIES:  No cyanosis, clubbing, or edema.  Pulses are intact, both dorsalis pedis and posterior tibial. NEURO:  Grossly intact. SKIN:  Warm and dry.  EKGs reviewed. Laboratory data pending.  Chest x-ray pending.  ASSESSMENT: 1. Unstable angina, rule out non-ST-segment elevation myocardial     infarction.  [name] is without pain relief with sublingual     nitroglycerin x4, now IV nitro and heparin as well as an aspirin at     home 325 mg.  [name] has a history of a v fib arrest with an anterior     [name] infarct in May 2011.  Last catheterization showed a patent     bare-metal stent and a 75% diagonal/ramus.  [name] had been  treated     medically since September 2011. 2. Recovery with left ventricular systolic function, last ejection     fraction 60%. 3. Hyperlipidemia. 4. Gastroesophageal reflux. 5. Degenerative joint disease. 6. Intolerance to SULFA and CODEINE.  PLAN:  I have discussed Dr. Riley Campbell and the cath lab team.  Urgent cath is now being engaged.  Indications, risks, and potential benefits were discussed.  The patient agrees to proceed.    [name] Gorczyca C. Daleen Squibb, MD, [facility]    TCW/MEDQ  D:  04/10/2010  T:  04/11/2010  Job:  [id number]  cc:   [name] Perla, DO  Electronically Signed by [name] Castle MD [facility] on 04/25/2010 04:14:11 PM

## 2010-04-28 NOTE — Discharge Summary (Signed)
[name], [name]                 ACCOUNT NO.:  [id number]  MEDICAL RECORD NO.:  [id number]          PATIENT TYPE:  INP  LOCATION:  2008                         FACILITY:  [facility]  PHYSICIAN:  [name] Pick. Eden Emms, MD, FACCDATE OF BIRTH:  [birth year]  DATE OF ADMISSION:  04/13/2010 DATE OF DISCHARGE:  04/15/2010                              DISCHARGE SUMMARY   PRIMARY CARDIOLOGIST:  [name] Morton. Riley Kill, MD, [facility]  PRIMARY CARE PROVIDER:  Lelon Perla, DO  DISCHARGE DIAGNOSIS:  Chest pain without objective evidence of ischemia.  SECONDARY DIAGNOSES: 1. Coronary artery disease status post prior ventricular fibrillation     arrest and anterior myocardial infarction with bare-metal stent to     the left anterior descending, May 2011. 2. 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 3. Hyperlipidemia. 4. Musculoskeletal chest pain. 5. Gastroesophageal reflux disease. 6. Osteoarthritis. 7. History of T9 compression fracture. 8. Osteopenia. 9. Remote tobacco abuse. 10.Status post tonsillectomy. 11.Status post appendectomy. 12.Status post back surgery. 13.Status post hip fracture with surgical repair on the right.  ALLERGIES: 1. CODEINE. 2. SULFA.  PROCEDURES: 1. Right groin ultrasound in January 2012 showing a partially     thrombosed pseudoaneurysm with measuring 2 cm x 2 cm with a neck of     4 mm. 2. Compression of right groin pseudoaneurysm or right femoral artery     pseudoaneurysm.  Compression was maintained for over 45 minutes     with incomplete resolution of pseudoaneurysm. 3. Follow up ultrasound on January 10 that shows complete or no flow     into the pseudoaneurysm.  HISTORY OF PRESENT ILLNESS:  A 70 year old female with prior history of coronary artery disease status post VF arrest and subsequent bare-metal stenting of the LAD in May 2011 who was recently admitted to [facility] on January 5 secondary to complaints of chest pain.   During hospitalization, the patient underwent diagnostic catheterization revealing patent LAD stent with stable 70% stenosis in the mid ramus intermedius and nonobstructive right coronary artery disease.  The patient was discharged home on January 6 with concerns that pain may have been musculoskeletal.  Unfortunately, the patient had recurrent pain on January 8 prompting her to present back to the [facility] ED where there was no objective evidence of ischemia.  The patient was admitted for further evaluation.  Of note, she also complained of right groin pain that were occurring since her most recent catheterization and she was noted to have significant bruising over the right groin area.  HOSPITAL COURSE:  The patient ruled out for MI.  She did have some reproduction of chest pain with palpation of her chest and arm.  Given results of recent catheterization and no objective evidence ischemia during this admission, we have opted not to pursue additional ischemic evaluation.  With regards to the patient's groin pain and bruising, an ultrasound was performed of the right groin on January 8 showing a right femoral arterial partially thrombosed pseudoaneurysm measuring 2 cm x 2 cm with neck measuring 4 mm.  It was felt that this could be successfully compressed on ultrasound guidance.  Compression  was performed on the morning of January 9 and after 45 minutes of intermittent compression, there was still slight flow noted in the aneurysm.  The patient was maintained on bedrest and follow up ultrasound this morning shows complete resolution of pseudoaneurysm.  There was question as to whether or not there was a Campbell component to the patient's chest pain.  She has been maintained on PPI therapy which she takes at home.  She has been seen by [facility] and as there is a reproducible nature to pain with palpation over the left breast and left bicipital groove, it was felt that  discomfort was unlikely to be of Campbell origin.  They recommended continuation of PPI, but they did not feel the patient required EGD at this time.  As such, [name] will be discharged home today in good condition.  DISCHARGE LABORATORY DATA:  Hemoglobin 12.3, hematocrit 35, WBC 5.2, and platelets 221.  Sodium 137, potassium 4.0, chloride 101, CO2 of 29, BUN 10, creatinine 0.70, and glucose 120.  Total bilirubin 0.6, alkaline phosphatase 67, AST 23, ALT 17, total protein 6.2, albumin 3.3, calcium 8.9, amylase 51, and lipase 26.  CK 37, MB 1.2, and troponin I 0.01. MRSA screen was negative.  DISPOSITION:  The patient will be discharged home today in good condition.  FOLLOWUP PLANS AND APPOINTMENTS:  The patient is to follow up with [name] Newcomer, PA in [facility] Cardiology Office on January 25, at 10 a.m.  The patient is to follow up [name] as previous scheduled.  She will follow up with [name] as needed.  DISCHARGE MEDICATIONS: 1. Aspirin 81 mg daily. 2. Calcium carbonate plus D b.i.d. 3. Multivitamin plus iron daily. 4. Toprol-XL 25 mg daily. 5. Nitroglycerin 0.4 mg sublingual p.r.n. chest pain. 6. Protonix 40 mg daily. 7. Plavix 75 mg daily. 8. Rosuvastatin 20 mg q.p.m. 9. Tylenol Extra Strength 2 tabs q.6 h p.r.n.  OUTSTANDING LABORATORY STUDIES:  None.  DURATION OF DISCHARGE ENCOUNTER:  40 minutes including physician time.     [name], [name]   ______________________________ [name] Pick. Eden Emms, MD, [facility]    CB/MEDQ  D:  04/15/2010  T:  04/16/2010  Job:  [id number]  cc:   [name] Perla, DO  Electronically Signed by [name] [name] on 04/28/2010 03:45:43 PM Electronically Signed by [name] Haws MD [facility] on 04/28/2010 05:29:20 PM

## 2010-05-01 NOTE — Discharge Summary (Addendum)
[name], [name] NO.:  [id number]  MEDICAL RECORD NO.:  [id number]          PATIENT TYPE:  INP  LOCATION:  2920                         FACILITY:  [facility]  PHYSICIAN:  Arturo Morton. Riley Kill, MD, FACCDATE OF BIRTH:  06-04-1940  DATE OF ADMISSION:  04/10/2010 DATE OF DISCHARGE:  04/11/2010                              DISCHARGE SUMMARY   PRIMARY CARDIOLOGIST:  Arturo Morton. Riley Kill, MD, [facility]  PRIMARY CARE DOCTOR:  Lelon Perla, DO  DISCHARGE DIAGNOSIS:  Chest pain without objective evidence of ischemia.  SECONDARY DIAGNOSES: 1. Coronary artery disease status post prior ventricular fibrillation     arrest and anterior myocardial infarction with bare-metal stenting     to the left anterior descending in May 2011. 2. Hyperlipidemia. 3. Musculoskeletal chest pain. 4. Gastroesophageal reflux disease. 5. Osteoarthritis. 6. History of T9 compression fracture. 7. Osteopenia. 8. Remote tobacco abuse. 9. Status post tonsillectomy. 10.Status post appendectomy.. 11.Status post back surgery. 12.Status post hip fracture, surgical repair. 13.Status post lower extremity surgery.  ALLERGIES:  CODEINE and SULFA.  PROCEDURES:  Left heart cardiac catheterization performed on April 10, 2010, revealing patent LAD stent with 70% stable stenosis in the ramus intermedius and a patent right coronary artery.  Normal LV function. Medical therapy was recommended.  HISTORY OF PRESENT ILLNESS:  A 70 year old female with prior history of coronary artery disease status post VF arrest and LAD bare metal stent in May 2011 who was in her usual state of health until the morning of admission when she began to experience substernal chest pressure unrelieved by sublingual nitroglycerin.  She was taken to [facility] ED where there was question of inferior ST-segment depression.  She was given additional nitroglycerin and subsequently IV nitroglycerin and heparin bolus in the ED and  continued to complain of pain.  Given her prior history, the patient was taken urgently to the cath lab for evaluation.  HOSPITAL COURSE:  A diagnostic catheterization was performed on January 5 revealing patent LAD stent with 70% stenosis in the ramus intermedius which was stable compared to prior catheterization in September 2011. RCA was nonobstructive.  The patient's LV function was normal.  Medical therapy was recommended.  Unfortunately, the patient does not tolerate Imdur therapy secondary to severe headaches.  She feels that her chest pain may be musculoskeletal in nature as she was lifting luggage recently and thinks she may have strained the muscle on her side.  Her enzymes have been negative and she has had no objective evidence of ischemia otherwise.  Plan to discharge her home today in good condition. If she continues to have recurrent exertional chest pain, we will consider a Ranexa therapy in the outpatient setting.  DISCHARGE LABORATORY FINDINGS:  Hemoglobin 13.5, hematocrit 40.2, WBC 4.7, platelets 234, INR 0.94.  Sodium 133, potassium 4.4, chloride 100, CO2 26, BUN 17, creatinine 0.65, glucose 109, calcium 8.7, CK 65, MB 1.7, troponin-I 0.01.  MRSA screen was negative.  DISPOSITION:  The patient will be discharged home today in good condition.  FOLLOWUP PLANS AND APPOINTMENTS:  We will arrange for followup with Tereso Newcomer PA at [facility]  Cardiology on January 25 at 10:00 a.m.  We will follow up with Dr. Laury Axon as scheduled.  DISCHARGE MEDICATIONS: 1. Nitroglycerin 0.4 mg subcu p.r.n. chest pain. 2. Aspirin 81 mg daily. 3. Calcium over-the-counter 1 tablet b.i.d. 4. Multivitamin plus iron 1 tablet daily. 5. Toprol-XL 25 mg daily. 6. Protonix 40 mg daily. 7. Plavix 75 mg. 8. Rosuvastatin 20 mg q.p.m. 9. Tylenol Extra Strength 500 mg 2 tablets q.6 h. p.r.n. 10.Vitamin D3 over-the-counter 1 tablet daily.  OUTSTANDING LABORATORY STUDIES:  None.  DURATION OF  DISCHARGE ENCOUNTER:  40 minutes including physician time.     Nicolasa Ducking, ANP   ______________________________ Arturo Morton. Riley Kill, MD, [facility]    CB/MEDQ  D:  04/11/2010  T:  04/12/2010  Job:  [id number]  cc:   Lelon Perla, DO  Electronically Signed by Nicolasa Ducking ANP on 04/28/2010 03:45:32 PM Electronically Signed by Shawnie Pons MD [facility] on 05/01/2010 04:43:45 AM

## 2010-05-01 NOTE — H&P (Addendum)
NAMEMERCER, STALLWORTH NO.:  [id number]  MEDICAL RECORD NO.:  [id number]          PATIENT TYPE:  EMS  LOCATION:  MAJO                         FACILITY:  [facility]  PHYSICIAN:  Rollene Rotunda, MD, FACCDATE OF BIRTH:  1940-12-13  DATE OF ADMISSION:  04/13/2010 DATE OF DISCHARGE:                             HISTORY & PHYSICAL   REASON FOR PRESENTATION:  Evaluate the patient with chest pain.  HISTORY OF PRESENT ILLNESS:  The patient is a pleasant 70 year old white female with a history of coronary artery disease.  She was just discharged a couple of days ago after catheterization with results described below.  She presented with the same type of chest discomfort. This is under her left breast.  She says it is a dull discomfort.  It is at least moderate in intensity.  She thinks it is similar to previous MI pain but not as intense.  It is similar to the pain that she had when she had her catheterization a few days ago.  It happens at rest.  She did take 3 nitroglycerin today and had no significant improvement with this.  There is some nausea and dry heaves.  She has not had diaphoresis.  She did have some radiation in left arm and her right neck is slightly stiff.  She presented to the emergency room where the first set of enzymes were negative.  She had some very subtle T-wave inversions in her EKG anteriorly slightly different than previous.  Of note, following her catheterization on the 5th she was managed medically.  PAST MEDICAL HISTORY:  Coronary artery disease (catheterization April 10, 2010 with an LAD stent that was patent.  She had a ramus intermediate, was small, with 70% stenosis.  There was nonobstructive disease elsewhere.  Her EF was preserved.), dyslipidemia, gastroesophageal reflux disease, osteopenia, osteoarthritis.  PAST SURGICAL HISTORY:  Appendectomy, tonsillectomy, back surgery, ORIF.  SOCIAL HISTORY:  The patient was a previous smoker.  She  is married and lives with her husband.  She is active.  FAMILY HISTORY:  Negative for early coronary artery disease.  REVIEW OF SYSTEMS:  As stated in the HPI.  She has had some discomfort over the right groin femoral access site.  There has been a slight expanding "lump" there, otherwise negative for all other systems.  PHYSICAL EXAMINATION:  GENERAL:  The patient is pleasant, no distress. VITAL SIGNS:  Blood pressure 128/72, heart rate 70 and regular, respiratory rate 16, afebrile. HEENT:  Eyelids unremarkable, pupils equal, round, react to light, fundi not visualized, oral mucosa unremarkable, upper dentures. NECK:  No jugular venous distention at 45 degrees, carotid upstroke brisk and symmetrical, no bruits, no thyromegaly. LYMPHATICS:  No cervical, axillary or inguinal adenopathy. LUNGS:  Clear to auscultation bilaterally. BACK:  No costovertebral tenderness. CHEST:  Unremarkable. HEART:  PMI not displaced or sustained, S1, S2 within normal limits, no S3, no S4, no clicks, no rubs, no murmurs. ABDOMEN:  Flat, positive bowel sounds, normal in frequency and pitch. No bruits, no rebound, no guarding or no midline pulsatile mass, no hepatomegaly, no splenomegaly. SKIN:  No rashes, no nodules.  EXTREMITIES:  Pulses 2+ throughout, no edema, no cyanosis, no clubbing. Right groin with 4 x 5 cm mass with ecchymosis, slight bruit, slight pulsatility. SKIN:  No rashes, no nodules. NEURO:  Oriented to person, place, and time.  Cranial nerves II-XII grossly intact, motor grossly intact.  EKG sinus rhythm, rate 66, axis within normal.  Intervals within normal limits, nonspecific anterior T-wave inversions, V3 through V5.  LABORATORY DATA:  Troponin 0.01, CK 57, MB 1.7, sodium 134, potassium 4.5, BUN 9, creatinine 0.69, WBC 5.8, hemoglobin 12.2, platelets 222.  ASSESSMENT AND PLAN: 1. Chest discomfort.  The patient's chest comfort is quite atypical.     It is the same as when she had  her cath and was found to have small     vessel disease.  If there is no objective evidence of ischemia with     enzymes and an EKG overnight then no further cardiac workup would     be suggested.  Rather I will keep her n.p.o. and consult GI in the     morning for possible GI evaluation.  I will be avoiding heparin     because of the groin.  She will be on aspirin, low dose of beta-     blockers, and nitroglycerin paste. 2. Right groin pain.  I have ordered an ultrasound to rule out     pseudoaneurysm. 3. Dyslipidemia.  She will remain on the meds as listed.     Rollene Rotunda, MD, [facility]     JH/MEDQ  D:  04/13/2010  T:  04/13/2010  Job:  [id number]  Electronically Signed by Rollene Rotunda MD [facility] on 05/01/2010 12:27:48 PM

## 2010-05-01 NOTE — Procedures (Addendum)
[name], DEUPREE NO.:  [id number]  MEDICAL RECORD NO.:  [id number]          PATIENT TYPE:  INP  LOCATION:  2920                         FACILITY:  [facility]  PHYSICIAN:  Arturo Morton. Riley Kill, MD, FACCDATE OF BIRTH:  01/22/1941  DATE OF PROCEDURE:  04/10/2010 DATE OF DISCHARGE:                           CARDIAC CATHETERIZATION   INDICATIONS:  [name] is well known to me.  She previously presented with ventricular fibrillation and an anterior wall infarction and was treated with a stent.  Since that time, she has gotten along reasonably well but had one repeat catheterization done previously.  She presented today with chest pain.  There were nonspecific changes on EKG, and initial set of enzymes were negative.  [place] somewhat sunburned but not severely so.  However, Dr. Daleen Squibb was concerned about her appearance and recommend urgent catheterization appropriately.  She was brought to the lab as an urgent cath.  Her laboratories were obtained prior to initiating the procedure, and her creatinine was stable as was her hemoglobin.  The case was done urgently.  PROCEDURE: 1. Left heart catheterization. 2. Selective coronary arteriography. 3. Selective left ventriculography.  DESCRIPTION OF PROCEDURE:  The procedure was performed from the right femoral artery.  She tolerated the procedure well.  There were no complications.  She was taken to the holding area in satisfactory clinical condition.  There is an urgent call just as she was being completed, but I did look at her old films and compared them to the new films.  Initial conservative management was suggested.  HEMODYNAMIC DATA: 1. The central aortic pressure is 138/72, mean 97. 2. Left ventricular pressure 135/8. 3. There was no gradient or pullback across the aortic valve.  ANGIOGRAPHIC DATA: 1. The left main is free of critical disease with ostial tapering that  is very mild. 2. The left anterior descending artery courses to the apex.  There are     2 major diagonal branches and some tapering distally.  In the     proximal portion of the vessel, there is a stent.  Just preceding     the stent, there is about 20% irregularity, but no significant high-     grade narrowing.  The stent itself demonstrates no significant in-     stent restenosis, clearly being less than 20%. 3. There is a ramus intermedius vessel that has been previously     identified.  This ramus does have about 70% narrowing just before     the vessel bifurcates.  The vessel in this location is relatively     small in caliber.  It does not appear to be subtotally occluded on     multiple views, but could potentially be the source of her     symptoms.  The vessel diameter in this location does not appear to     be much larger than a 2.0 vessel.  In most views, it would not     appear to be the source of rest discomfort. 4. The AV circumflex demonstrates a widely patent artery proximally.  More distally, there is perhaps 20-30% narrowing just after the     takeoff of a small branch vessel and no critical narrowing is     noted. 5. The right coronary artery is a calcified large caliber artery.     There is mild luminal irregularity.  The PDA bifurcates proximally     and is a twin vessel with mild luminal irregularity, but no     critical stenoses.  There are 3 posterolateral branches, 2 of which     the first were small and the third one is moderate in size.  Again,     no critical narrowing is noted.  Diffuse calcification of the     vessel was noted on fluoroscopy. 6. Ventriculography done in the RAO projection reveals vigorous global     systolic function.  No definite wall motion abnormalities are seen.     Importantly, there is no definite anterolateral wall motion     abnormality to suggest an intermediate stunned myocardium.  CONCLUSION: 1. Normal left ventricular  function. 2. Continued patency of the left anterior descending stent. 3. No significant high-grade circumflex or RCA disease. 4. Moderate stenosis in the ramus intermedius which is somewhat small     caliber and uncertain hemodynamic significance.  At the present     time, I would be surprised if the circumflex intermediate is the     source of resting symptoms.  We will get serial enzymes and     ambulate her and see how she does.  We may approach this with a     continued conservative approach.  I have explained this to the     husband.     Arturo Morton. Riley Kill, MD, [facility]     TDS/MEDQ  D:  04/10/2010  T:  04/11/2010  Job:  [id number]  cc:   CV Laboratory Jesse Sans. Daleen Squibb, MD, [facility]  Electronically Signed by Shawnie Pons MD [facility] on 05/01/2010 04:43:41 AM

## 2010-05-06 NOTE — Assessment & Plan Note (Signed)
Summary: Cardiology Nuclear Testing  Nuclear Med Background Indications for Stress Test: Evaluation for Ischemia, Stent Patency, Post Hospital  Indications Comments: 9/11 [facility] with CP and cath  History: Echo, GXT, Heart Catheterization, Myocardial Infarction, Stents  History Comments: 5/11 MI with VFib/Arrest and stent of LAD 7/11GXT Indeterminate and Echo with EF:nl 12/16/09 Cath 70-75% RI and CFX,EF-nl and patent stent.  Symptoms: Chest Pain, Chest Pain with Exertion, Dizziness, Fatigue, Nausea, Rapid HR, SOB    Nuclear Pre-Procedure Cardiac Risk Factors: History of Smoking, Lipids Caffeine/Decaff Intake: 8 am NPO After: 8:00 AM Lungs: Clear IV 0.9% NS with Angio Cath: 20g     IV Site: (L) wrist IV Started by: Stanton Kidney, EMT-P Chest Size (in) 38     Cup Size D     Height (in): 66.5 Weight (lb): 152 BMI: 24.25 Tech Comments: Metoprolol held > 24 hours, per Pt.  Nuclear Med Study 1 or 2 day study:  1 day     Stress Test Type:  Stress Reading MD:  Marca Ancona, MD     Referring MD:  Ermalene Postin Resting Radionuclide:  Technetium 27m Tetrofosmin     Resting Radionuclide Dose:  10.8 mCi  Stress Radionuclide:  Technetium 67m Tetrofosmin     Stress Radionuclide Dose:  33 mCi   Stress Protocol Exercise Time (min):  5:01 min     Max HR:  144 bpm     Predicted Max HR:  150 bpm  Max Systolic BP: 180 mm Hg     Percent Max HR:  96 %     METS: 6.1 Rate Pressure Product:  04540    Stress Test Technologist:  Irean Hong,  RN     Nuclear Technologist:  Domenic Polite, CNMT  Rest Procedure  Myocardial perfusion imaging was performed at rest 45 minutes following the intravenous administration of Technetium 7m Tetrofosmin.  Stress Procedure  The patient exercised for 5 minutes and 01 second.  The patient stopped due to DOE,   and complained of chest pain 3/10.  There were  significant ST-T wave changes, rare Pvc,Pac.  Technetium 70m Tetrofosmin was injected at peak exercise and  myocardial perfusion imaging was performed after a brief delay.  QPS Raw Data Images:  Normal; no motion artifact; normal heart/lung ratio. Stress Images:  Normal homogeneous uptake in all areas of the myocardium. Rest Images:  Normal homogeneous uptake in all areas of the myocardium. Subtraction (SDS):  There is no evidence of scar or ischemia. Transient Ischemic Dilatation:  1.15  (Normal <1.22)  Lung/Heart Ratio:  .28  (Normal <0.45)  Quantitative Gated Spect Images QGS EDV:  64 ml QGS ESV:  22 ml QGS EF:  65 % QGS cine images:  Normal wall motion.    Overall Impression  Exercise Capacity: Fair exercise capacity. BP Response: Normal blood pressure response. Clinical Symptoms: Short of breath, mild chest pain.  ECG Impression: Insignificant upsloping ST segment depression. Overall Impression: Normal stress nuclear study.  Appended Document: Cardiology Nuclear Testing Attempted to call patient and review status. Goal is for med treatment.  No significant perfusion defect, but perhpas some angina on GXT.  No answering machine.  Will attempt to call back.TS  Appended Document: Cardiology Nuclear Testing Spoke with patient by phone and reviewed her studies.  She has not had any further chest discomfort and feels that the symptoms that led to hospitalization were due to over doing some lifting as they were moving things in anticipation of painting their home.  She  has not had recurrence, having been in [place], [place] at [facility] where her sister died yesterday.  Of note, she no longer takes Imdur secondary to headaches, but does have NTG if necessary.  I will see her on the 18th of October, and we will again review   TS

## 2010-05-06 NOTE — Progress Notes (Signed)
  Phone Note From Other Clinic   Caller: Dr Riley Kill Call For: [name] Summary of Call: Pt was admitted for CP today and found to be in v fib---pt cardiovered and should be d/c'd in about 4 days Initial call taken by: Loreen Freud DO,  Aug 14, 2009 4:47 PM  Follow-up for Phone Call        danielle--please print out info from hospital---thanks Follow-up by: Loreen Freud DO,  Aug 14, 2009 4:49 PM  Additional Follow-up for Phone Call Additional follow up Details #1::        on your ledge. Army Fossa CMA  Aug 15, 2009 8:12 AM   New Problems: VENTRICULAR FIBRILLATION (ICD-427.41) ATRIAL FIBRILLATION (ICD-427.31)   New Problems: VENTRICULAR FIBRILLATION (ICD-427.41) ATRIAL FIBRILLATION (ICD-427.31)

## 2010-05-06 NOTE — Progress Notes (Signed)
Summary: opinion on procedure  Phone Note Call from Patient   Caller: Patient Summary of Call: pt left  VM that it is recommended that she have a fusion of the 4th& 3rd disk. pt would like to know what your opinion is on this matter......................Marland KitchenFelecia Deloach CMA  April 30, 2009 3:04 PM     Follow-up for Phone Call        It probably does need to be done but if she is not sure she can get a second opinion Follow-up by: Loreen Freud DO,  April 30, 2009 4:39 PM  Additional Follow-up for Phone Call Additional follow up Details #1::        left message to call office.............Marland KitchenFelecia Deloach CMA  April 30, 2009 4:54 PM     Additional Follow-up for Phone Call Additional follow up Details #2::    pt aware..................Marland KitchenFelecia Deloach CMA  May 01, 2009 8:16 AM

## 2010-05-06 NOTE — Progress Notes (Signed)
Summary: refill meds  Phone Note Refill Request Call back at Home Phone [phone] Call back at Work Phone [phone] Message from:  Patient on December 30, 2009 3:01 PM  Refills Requested: Medication #1:  PANTOPRAZOLE SODIUM 40 MG TBEC once a day mail order [facility].    Method Requested: Fax to Mail Away Pharmacy Initial call taken by: Lorne Skeens,  December 30, 2009 3:01 PM  Follow-up for Phone Call       Follow-up by: Judithe Modest CMA,  December 31, 2009 10:41 AM    Prescriptions: PANTOPRAZOLE SODIUM 40 MG TBEC (PANTOPRAZOLE SODIUM) once a day  #90 x 3   Entered by:   Judithe Modest CMA   Authorized by:   Marca Ancona, MD   Signed by:   Judithe Modest CMA on 12/31/2009   Method used:   [facility]        [facility] [address] [phone]* (retail)       [address]       [place], [place]  [address]       Ph: [phone] or [phone]       Fax: [phone]   RxID:   [id number]

## 2010-05-06 NOTE — Assessment & Plan Note (Signed)
Summary: NAUSEA, SINUS DRAINAGE, NO FEVER, TIRED///SPH   Vital Signs:  Patient profile:   70 year old female Weight:      157.6 pounds Temp:     97.1 degrees F oral BP sitting:   112 / 70  (left arm)  Vitals Entered By: Doristine Devoid (April 10, 2009 10:49 AM) CC: Nausea from sinus drainage along w/ some sinus pressure    Primary Care Provider:  Laury Axon  CC:  Nausea from sinus drainage along w/ some sinus pressure .  History of Present Illness: Ms Fritchman is a 70 year old female who presents with c/o sinus drainage, feels worn out, notes + nausea.  Notes mild sinus pressure.  + anorexia  Allergies: 1)  ! Sulfa 2)  ! Codeine  Review of Systems       denies fever, denies cough,  +nasal drainage-? clear  Physical Exam  General:  Well-developed,well-nourished,in no acute distress; alert,appropriate and cooperative throughout examination Head:  Normocephalic and atraumatic without obvious abnormalities. No apparent alopecia or balding. Ears:  External ear exam shows no significant lesions or deformities.  Otoscopic examination reveals clear canals, tympanic membranes are intact bilaterally without bulging, retraction, inflammation or discharge. Hearing is grossly normal bilaterally. Mouth:  no lesions, no tonsilar exudate Lungs:  Normal respiratory effort, chest expands symmetrically. Lungs are clear to auscultation, no crackles or wheezes. Heart:  Normal rate and regular rhythm. S1 and S2 normal without gallop, murmur, click, rub or other extra sounds.   Impression & Recommendations:  Problem # 1:  SINUSITIS (ICD-473.9) Assessment New Will treat for sinusitus.  Patient advised to call if pain with EOM, fever, if syptoms worsen or do not improve Her updated medication list for this problem includes:    Amoxicillin 500 Mg Cap (Amoxicillin) .Marland Kitchen... Take 1 capsule by mouth three times a day x 10 days  Complete Medication List: 1)  Diclofenac Sodium 75 Mg Tbec (Diclofenac sodium)  .Marland Kitchen.. 1 by mouth by mouth two times a day 2)  Evista 60 Mg Tabs (Raloxifene hcl) .... Take 1 tab once daily 3)  [facility] Vitamin D 2000 Unit Caps (Cholecalciferol) .... Daily 4)  Eq Omeprazole 20 Mg Tbec (Omeprazole) .Marland Kitchen.. 1 by mouth once daily 5)  [facility] Womens Daily Multivitamin Tabs (Multiple vitamins-minerals) .... With iron daily 6)  Caltrate 600 1500 Mg Tabs (Calcium carbonate) .... Daily 7)  Ultram 50 Mg Tabs (Tramadol hcl) .Marland Kitchen.. 1 by mouth q6h as needed 8)  Amoxicillin 500 Mg Cap (Amoxicillin) .... Take 1 capsule by mouth three times a day x 10 days   Patient Instructions: 1)  Call if increased sinus pressure, pain when moving eyes, or fever over 101, or if your symptoms do not improve. Prescriptions: AMOXICILLIN 500 MG CAP (AMOXICILLIN) Take 1 capsule by mouth three times a day X 10 days  #30 x 0   Entered and Authorized by:   Lemont Fillers FNP   Signed by:   Lemont Fillers FNP on 04/10/2009   Method used:   [facility] to        [facility] [phone]* (retail)       [address]       [place], [place]  [address]       Ph: [phone] or [phone]       Fax: [phone]   RxID:   [id number]

## 2010-05-06 NOTE — Letter (Signed)
Summary: Quality of Life Questionnaire/[facility]  Quality of Life Questionnaire/[facility]   Imported By: Lanelle Bal 10/15/2009 10:49:52  _____________________________________________________________________  External Attachment:    Type:   Image     Comment:   External Document  Appended Document: Quality of Life Questionnaire/[facility] reveiwed.  TS

## 2010-05-06 NOTE — Progress Notes (Signed)
Summary: lab results  Phone Note Call from Patient Call back at Home Phone [phone]   Caller: Patient [phone] Reason for Call: Talk to Nurse Summary of Call: was told to stop taking her medication due to her lab work coming back elevated-had new lab work and would like results to see if she can continue med Initial call taken by: Glynda Jaeger,  September 23, 2009 12:26 PM  Follow-up for Phone Call        I spoke with the pt about her BMP results.  The pt's Lisinopril has been discontinued due to hyperkalemia.  I instructed the pt to remain off of Lisinopril. The pt is scheduled to see Dr Riley Kill on 10/09/09 in follow-up.  Follow-up by: Julieta Gutting, RN, BSN,  September 23, 2009 4:55 PM

## 2010-05-06 NOTE — Assessment & Plan Note (Signed)
Summary: eph   Visit Type:  Post-hospital Primary Provider:  Laury Axon  CC:  chest pain.  History of Present Illness: Overall doing pretty well.  Does not have as much stamina.  The chest discomfort is feeling better.  Patient was readmitted to the hospital.  Starting cardiac rehab next week.  When she gets out and walks, she gets more winded, but no angina.  Non steroidals reviewed.    Current Medications (verified): 1)  Evista 60 Mg  Tabs (Raloxifene Hcl) .... Take 1 Tab Once Daily 2)  Cvs Vitamin D 2000 Unit Caps (Cholecalciferol) .... Daily 3)  Qc Womens Daily Multivitamin  Tabs (Multiple Vitamins-Minerals) .... With Iron Daily 4)  Caltrate 600 1500 Mg Tabs (Calcium Carbonate) .... Daily 5)  Ibuprofen 200 Mg Caps (Ibuprofen) .... 3 By Mouth Every 6 Hrs As Needed 6)  Aspirin 325 Mg Tabs (Aspirin) .Marland Kitchen.. 1 By Mouth Daily 7)  Lisinopril 10 Mg Tabs (Lisinopril) .Marland Kitchen.. 1 By Mouth Daily 8)  Metoprolol Succinate 25 Mg Xr24h-Tab (Metoprolol Succinate) .Marland Kitchen.. 1 By Mouth Daily 9)  Nitrostat 0.4 Mg Subl (Nitroglycerin) .Marland Kitchen.. 1 Tab Under Tongue Every 5 Mins As Needed Up 3 Doses As Needed. 10)  Pantoprazole Sodium 40 Mg Tbec (Pantoprazole Sodium) .... Once A Day 11)  Effient 10 Mg Tabs (Prasugrel Hcl) .Marland Kitchen.. 1 By Mouth Daily 12)  Crestor 40 Mg Tabs (Rosuvastatin Calcium) .Marland Kitchen.. 1 By Mouth Every Evening. 13)  Tylenol Extra Strength 500 Mg Tabs (Acetaminophen) .... 2 By Mouth Every 6hrs As Needed.  Allergies: 1)  ! Sulfa 2)  ! Codeine  Vital Signs:  Patient profile:   70 year old female Height:      67.25 inches Weight:      153 pounds BMI:     23.87 Pulse rate:   58 / minute Pulse rhythm:   irregular Resp:     18 per minute BP sitting:   128 / 72  (left arm) Cuff size:   large  Vitals Entered By: Vikki Ports (September 04, 2009 12:44 PM)  Physical Exam  General:  Well developed, well nourished, in no acute distress. Head:  normocephalic and atraumatic Eyes:  PERRLA/EOM intact; conjunctiva and  lids normal. Lungs:  Clear bilaterally to auscultation and percussion. Heart:  PMI non displaced.  Normal S1 and S2.  No murmur.  Extremities:  No clubbing or cyanosis. Neurologic:  Alert and oriented x 3.   Cardiac Cath  Procedure date:  08/15/2009  Findings:       1. Ventriculography done in the RAO projection reveals anterolateral       apical hypo and akinesis.  Ejection fraction will be estimated       around 45%.  There was hyperdynamic function of the anterior base       and inferior base.   2. The ostium of the left main has about 20% tapered narrowing       possibly due to ectasia in the mid body of the left main.  The left       main then opens up and provides an LAD with an intermediate and/or       optional diagonal.  The optional diagonal was somewhat smaller in       caliber with a 60% area of stenosis just prior to bifurcation.       After this takeoff, the LAD itself is subtotally occluded, is a       large-caliber vessel with evidence of thrombus.  The  TIMI flow was       1 at that time.  Following stenting, the stenosis was reduced to 0%       with restoration of TIMI 3 flow.  There is some calcification just       distal with perhaps 20% narrowing beyond the stent.  There is a       large diagonal after this, and then a second bifurcation near the       apex.  There are several septal perforators distally as well.   3. The circumflex provides a bifurcating marginal which is free of       critical disease.   4. The right coronary artery is calcified as well but also       demonstrates no significant focal obstruction.  There is perhaps       20% in the midportion of the vessel.  The distal vessel consists of       a bifurcating PDA and a moderately large posterolateral system.      CONCLUSIONS:   1. Acute myocardial infarction, non-ST-segment elevation myocardial       infarction with anterolateral apical hypo to akinesis.   2. 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      EKG  Procedure date:  09/04/2009  Findings:      NSR.  Anterior T inversion, recent MI  CXR  Procedure date:  08/22/2009  Findings:       CHEST - 2 VIEW    Comparison: 08/16/2009.    Findings: Mild cardiomegaly.  No vascular congestion or active lung   process.  L1 vertebroplasty site.  Anterior wedging T9 vertebral   body, unchanged when compared to 07/10/2008    IMPRESSION:   Mild cardiomegaly.  No active pulmonary process.  Stable T9   compression fracture.  Diffuse osteopenia.    Read By:  Jonne Ply,  M.D.  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  Successful PCI with non DES.  Readmit for atypical symptoms.  Had rib fracture from CPR.  Ready to start rehab.  Her updated medication list for this problem includes:    Aspirin 325 Mg Tabs (Aspirin) .Marland Kitchen... 1 by mouth daily    Lisinopril 10 Mg Tabs (Lisinopril) .Marland Kitchen... 1 by mouth daily    Metoprolol Succinate 25 Mg Xr24h-tab (Metoprolol succinate) .Marland Kitchen... 1 by mouth daily    Nitrostat 0.4 Mg Subl (Nitroglycerin) .Marland Kitchen... 1 tab under tongue every 5 mins as needed up 3 doses as needed.    Effient 10 Mg Tabs (Prasugrel hcl) .Marland Kitchen... 1 by mouth daily  Orders: EKG w/ Interpretation (93000) Echocardiogram (Echo) Treadmill (Treadmill) TLB-BMP (Basic Metabolic Panel-BMET) (80048-METABOL) TLB-CBC Platelet - w/Differential (85025-CBCD)  Problem # 2:  HYPERLIPIDEMIA (ICD-272.4)  on medical therapy.  Will check at end of four more weeks.  Her updated medication list for this problem includes:    Crestor 40 Mg Tabs (Rosuvastatin calcium) .Marland Kitchen... 1 by mouth every evening.  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    Patient Instructions: 1)  Your physician recommends that you have lab work today: BMP, CBC 2)  Your physician has recommended you  make the following change in your medication: STOP Effient in 2 WEEKS, START Plavix 75mg  once a day in 2 WEEKS 3)  Your physician has requested that you have an echocardiogram in 4 WEEKS.  Echocardiography is a painless test that uses sound waves to create images of your heart. It provides your doctor with information about the size and shape of your heart and how well your heart's chambers and valves are working.  This procedure takes approximately one hour. There are no restrictions for this procedure. 4)  Your physician has requested that you have an exercise tolerance test in 4 WEEKS.  For further information please visit [email or web address]  Please also follow instruction sheet, as given. Prescriptions: PLAVIX 75 MG TABS (CLOPIDOGREL BISULFATE) Take one tablet by mouth daily  #90 x 3   Entered by:   Julieta Gutting, RN, BSN   Authorized by:   Ronaldo Miyamoto, MD, [facility]   Signed by:   Julieta Gutting, RN, BSN on 09/04/2009   Method used:   Electronically to        [facility]* (mail-order)             ,          Ph: [phone]       Fax: [phone]   RxID:   [id number] CRESTOR 40 MG TABS (ROSUVASTATIN CALCIUM) 1 by mouth every evening.  #90 x 3   Entered by:   Julieta Gutting, RN, BSN   Authorized by:   Ronaldo Miyamoto, MD, [facility]   Signed by:   Julieta Gutting, RN, BSN on 09/04/2009   Method used:   Electronically to        [facility] [employer]* (mail-order)             ,          Ph: [phone]       Fax: [phone]   RxID:   [id number] PANTOPRAZOLE SODIUM 40 MG TBEC (PANTOPRAZOLE SODIUM) once a day  #90 x 3   Entered by:   Julieta Gutting, RN, BSN   Authorized by:   Ronaldo Miyamoto, MD, [facility]   Signed by:   Julieta Gutting, RN, BSN on 09/04/2009   Method used:    Electronically to        [facility] [employer]* (mail-order)             ,          Ph: [phone]       Fax: [phone]   RxID:   [id number] METOPROLOL SUCCINATE 25 MG XR24H-TAB (METOPROLOL SUCCINATE) 1 by mouth daily  #90 x 3   Entered by:   Julieta Gutting, RN, BSN   Authorized by:   Ronaldo Miyamoto, MD, [facility]   Signed by:   Julieta Gutting, RN, BSN on 09/04/2009   Method used:   Electronically to        [facility] [employer]* (mail-order)             ,          Ph: [phone]       Fax: [phone]   RxID:   [id number] LISINOPRIL 10 MG TABS (LISINOPRIL) 1 by mouth daily  #90 x 3   Entered by:   Julieta Gutting, RN, BSN   Authorized by:   Ronaldo Miyamoto, MD, [facility]   Signed by:   Julieta Gutting, RN, BSN on 09/04/2009   Method used:   Electronically to  [facility]* (mail-order)             ,          Ph: [phone]       Fax: [phone]   RxID:   [id number]

## 2010-05-06 NOTE — Progress Notes (Signed)
Summary: headache from Imdur  Phone Note Call from Patient Call back at Home Phone [phone] Call back at Work Phone [phone]   Caller: Patient Summary of Call: Pt having reaction to Imdur pt have a bad headach Initial call taken by: Judie Grieve,  December 18, 2009 3:57 PM  Follow-up for Phone Call        I spoke with the pt and she is having a headache from Imdur.  The pt has been taking Tylenol as needed.  I instructed her to cut the tablet in half and take this for a couple of days and then increase to a whole tablet, if her HA improves.  I advised her not to take NSAIDS due to increased risk of GI bleed.  Pt agreed with plan.  Follow-up by: Julieta Gutting, RN, BSN,  December 18, 2009 4:48 PM

## 2010-05-06 NOTE — Miscellaneous (Signed)
Summary: [facility]   Imported By: Marylou Mccoy 10/09/2009 16:37:17  _____________________________________________________________________  External Attachment:    Type:   Image     Comment:   External Document

## 2010-05-06 NOTE — Assessment & Plan Note (Signed)
Summary: EPH   Visit Type:  Follow-up Primary Provider:  Laury Axon  CC:  Post-hospital.  History of Present Illness: Nuclear study reviewed in detail.  Denies chest pain.  Feels better. Husband had surgery today so she will be busy.  Anatomy of bifurcation reveiwed.  Films were reviewed with Dr. Jones Broom in the hospital, and the findings here regarding her anatomy.  Her perfusion images were also reviewed today.   Occasional palpitations.   Current Medications (verified): 1)  Cvs Vitamin D 2000 Unit Caps (Cholecalciferol) .... Daily 2)  Qc Womens Daily Multivitamin  Tabs (Multiple Vitamins-Minerals) .... With Iron Daily 3)  Caltrate 600 1500 Mg Tabs (Calcium Carbonate) .... Daily 4)  Ibuprofen 200 Mg Caps (Ibuprofen) .... 3 By Mouth Every 6 Hrs As Needed 5)  Aspirin Ec 325 Mg Tbec (Aspirin) .... Take One Tablet By Mouth Daily 6)  Metoprolol Succinate 25 Mg Xr24h-Tab (Metoprolol Succinate) .Marland Kitchen.. 1 By Mouth Daily 7)  Nitrostat 0.4 Mg Subl (Nitroglycerin) .Marland Kitchen.. 1 Tab Under Tongue Every 5 Mins As Needed Up 3 Doses As Needed. 8)  Pantoprazole Sodium 40 Mg Tbec (Pantoprazole Sodium) .... Once A Day 9)  Crestor 40 Mg Tabs (Rosuvastatin Calcium) .Marland Kitchen.. 1 By Mouth Every Evening. 10)  Plavix 75 Mg Tabs (Clopidogrel Bisulfate) .... Take One Tablet By Mouth Daily  Allergies: 1)  ! Sulfa 2)  ! Codeine  Vital Signs:  Patient profile:   70 year old female Height:      66.5 inches Weight:      158 pounds BMI:     25.21 Pulse rate:   66 / minute Pulse rhythm:   regular Resp:     18 per minute BP sitting:   128 / 80  (left arm) Cuff size:   large  Vitals Entered By: Vikki Ports (January 21, 2010 2:39 PM)  Physical Exam  General:  Well developed, well nourished, in no acute distress. Head:  normocephalic and atraumatic Eyes:  PERRLA/EOM intact; conjunctiva and lids normal. Lungs:  Clear bilaterally to auscultation and percussion. Heart:  PMI non displaced.  Normal S1 and S2.  No  murmur Extremities:  No clubbing or cyanosis. Neurologic:  Alert and oriented x 3.   EKG  Procedure date:  01/21/2010  Findings:      NSR.  T inversion in V1 and V2.  No Q waves.  Nuclear ETT  Procedure date:  12/25/2009  Findings:      QPS  Raw Data Images:  Normal; no motion artifact; normal heart/lung ratio. Stress Images:  Normal homogeneous uptake in all areas of the myocardium. Rest Images:  Normal homogeneous uptake in all areas of the myocardium. Subtraction (SDS):  There is no evidence of scar or ischemia. Transient Ischemic Dilatation:  1.15  (Normal <1.22)  Lung/Heart Ratio:  .28  (Normal <0.45)  Quantitative Gated Spect Images  QGS EDV:  64 ml QGS ESV:  22 ml QGS EF:  65 % QGS cine images:  Normal wall motion.    Overall Impression   Exercise Capacity: Fair exercise capacity. BP Response: Normal blood pressure response. Clinical Symptoms: Short of breath, mild chest pain.  ECG Impression: Insignificant upsloping ST segment depression. Overall Impression: Normal stress nuclear study.    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  Reviewed in detail.  Denies chest pain.  Continue medical therapy Her updated medication list for this  problem includes:    Aspirin Ec 325 Mg Tbec (Aspirin) .Marland Kitchen... Take one tablet by mouth daily    Metoprolol Succinate 25 Mg Xr24h-tab (Metoprolol succinate) .Marland Kitchen... 1 by mouth daily    Nitrostat 0.4 Mg Subl (Nitroglycerin) .Marland Kitchen... 1 tab under tongue every 5 mins as needed up 3 doses as needed.    Plavix 75 Mg Tabs (Clopidogrel bisulfate) .Marland Kitchen... Take one tablet by mouth daily  Problem # 2:  HYPERLIPIDEMIA (ICD-272.4) see results of last lipid.  will reduce dose and recheck in six weeks. Her updated medication list for this problem includes:    Crestor 20 Mg Tabs (Rosuvastatin calcium) .Marland Kitchen... Take one tablet by mouth daily.  Patient  Instructions: 1)  Your physician recommends that you schedule a follow-up appointment in: 2 MONTHS 2)  Your physician recommends that you return for a FASTING Lipid, Liver Profile and BMP (414.01, 272.0) in 6 WEEKS. Nothing to eat or drink after midnight--lab opens at 8:30--03/05/10  3)  Your physician has recommended you make the following change in your medication: DECREASE Crestor to 20mg  daily

## 2010-05-06 NOTE — Miscellaneous (Signed)
Summary: [facility]   [facility]   Imported By: Roderic Ovens 10/21/2009 14:47:26  _____________________________________________________________________  External Attachment:    Type:   Image     Comment:   External Document

## 2010-05-06 NOTE — Progress Notes (Signed)
Summary: Nausea,and vomiting   Phone Note Call from Patient Call back at [facility] Phone [phone]   Caller: Patient Summary of Call: Pt have question about  new medication  pt getting Nausea and vomiting Initial call taken by: Judie Grieve,  Aug 29, 2009 10:38 AM  Follow-up for Phone Call        I spoke with the pt and she had an episode of nausea and vomiting last week.  The pt is having heart burn at night and she sits up until 11:00 and then goes to bed.  Last night the pt had heart burn, nausea and vomiting.  The pt's evening medications are Crestor, Famotidine, and Calcium.  The pt also took some Aleve.  The pt took omeprazole prior to going into the hospital and this controlled her reflux.  The pt would like to know if she can take a different reflux medication.   PER PT CALLING BACK WITH CELL PHONE [phone] Lorne Skeens  Aug 29, 2009 3:14 PM  Follow-up by: Julieta Gutting, RN, BSN,  Aug 29, 2009 10:46 AM  Additional Follow-up for Phone Call Additional follow up Details #1::        Per Dr Riley Kill he would like the pt to stop taking Famotidine and start Protonix 40mg  once a day. Pt aware and Rx sent to the pharmacy. Julieta Gutting, RN, BSN  Aug 29, 2009 6:20 PM    New/Updated Medications: PANTOPRAZOLE SODIUM 40 MG TBEC (PANTOPRAZOLE SODIUM) take one tablet daily 30 minutes prior to your morning meal Prescriptions: PANTOPRAZOLE SODIUM 40 MG TBEC (PANTOPRAZOLE SODIUM) take one tablet daily 30 minutes prior to your morning meal  #30 x 6   Entered by:   Julieta Gutting, RN, BSN   Authorized by:   Ronaldo Miyamoto, MD, [facility]   Signed by:   Julieta Gutting, RN, BSN on 08/29/2009   Method used:   Electronically to        [facility]  [address] [phone]* (retail)       [address]       [place], [place]  [address]       Ph: [phone] or [phone]       Fax: [phone]   RxID:   [id number]

## 2010-05-06 NOTE — Miscellaneous (Signed)
Summary: [facility]   [facility]   Imported By: Roderic Ovens 10/23/2009 10:35:24  _____________________________________________________________________  External Attachment:    Type:   Image     Comment:   External Document

## 2010-05-06 NOTE — Miscellaneous (Signed)
Summary: [facility]   [facility]   Imported By: Roderic Ovens 01/28/2010 08:58:20  _____________________________________________________________________  External Attachment:    Type:   Image     Comment:   External Document

## 2010-05-06 NOTE — Letter (Signed)
Summary: Fax Regarding [facility]  Fax Regarding [facility]   Imported By: Lanelle Bal 09/18/2009 08:50:11  _____________________________________________________________________  External Attachment:    Type:   Image     Comment:   External Document  Appended Document: Fax Regarding [facility] we need to con't to follow glucose---recently drawn by cardiology

## 2010-05-06 NOTE — Progress Notes (Signed)
Summary: refill request  Phone Note Refill Request   Refills Requested: Medication #1:  PLAVIX 75 MG TABS Take one tablet by mouth daily.  Medication #2:  METOPROLOL SUCCINATE 25 MG XR24H-TAB 1 by mouth daily  Medication #3:  CRESTOR 40 MG TABS 1 by mouth every evening. [facility]   Method Requested: Telephone to Pharmacy Initial call taken by: Glynda Jaeger,  December 02, 2009 11:49 AM    Prescriptions: CRESTOR 40 MG TABS (ROSUVASTATIN CALCIUM) 1 by mouth every evening.  #90 x 3   Entered by:   Julieta Gutting, RN, BSN   Authorized by:   Ronaldo Miyamoto, MD, [facility]   Signed by:   Julieta Gutting, RN, BSN on 12/02/2009   Method used:   Faxed to ...       [facility] (mail-order)             , [place]         Ph: [phone]       Fax: [phone]   RxID:   [id number] PLAVIX 75 MG TABS (CLOPIDOGREL BISULFATE) Take one tablet by mouth daily  #90 x 3   Entered by:   Julieta Gutting, RN, BSN   Authorized by:   Ronaldo Miyamoto, MD, [facility]   Signed by:   Julieta Gutting, RN, BSN on 12/02/2009   Method used:   Faxed to ...       [facility] (mail-order)             , [place]         Ph: [phone]       Fax: [phone]   RxID:   [id number] METOPROLOL SUCCINATE 25 MG XR24H-TAB (METOPROLOL SUCCINATE) 1 by mouth daily  #90 x 3   Entered by:   Julieta Gutting, RN, BSN   Authorized by:   Ronaldo Miyamoto, MD, [facility]   Signed by:   Julieta Gutting, RN, BSN on 12/02/2009   Method used:   Faxed to ...       [facility] (mail-order)             , [place]         Ph: [phone]       Fax: [phone]   RxID:   [id number]

## 2010-05-06 NOTE — Progress Notes (Signed)
Summary: pt having chest pain  Phone Note Call from Patient   Caller: Patient Reason for Call: Talk to Nurse, Talk to Doctor Summary of Call: pt is having chest pain and SOB she just had a stent placed last Wednesday and got released Monday Initial call taken by: Omer Jack,  Aug 22, 2009 12:34 PM  Follow-up for Phone Call        Advised to take a NTG and call  911 to [facility] ER. She wanted  her husband to take her because she lives one mile from  the hospital. [employer] notified.   Follow-up by: Suzan Garibaldi RN

## 2010-05-06 NOTE — Progress Notes (Signed)
Summary: nuc pre procedure  Phone Note Outgoing Call Call back at Home Phone [phone]   Call placed by: Cathlyn Parsons RN,  December 24, 2009 3:46 PM Call placed to: Patient Reason for Call: Confirm/change Appt Summary of Call: Reviewed information on [employer] Information Sheet (see scanned document for further details).  Spoke with patient.      Nuclear Med Background Indications for Stress Test: Evaluation for Ischemia, Post Hospital  Indications Comments: 9/11 [facility] with CP and cath  History: Echo, GXT, Heart Catheterization, Myocardial Infarction, Stents  History Comments: 5/11 MI with VFib/Arrest and stent of LAD 7/11GXT Indeterminate and Echo with EF:nl 12/16/09 Cath 70-75% RI and CFX,EF-nl and patent stent.  Symptoms: Chest Pain    Nuclear Pre-Procedure Cardiac Risk Factors: History of Smoking, Lipids Height (in): 67.25

## 2010-05-06 NOTE — Assessment & Plan Note (Signed)
Summary: hosp f/u/cbs   Vital Signs:  Patient profile:   70 year old female Height:      67.25 inches Weight:      157 pounds BMI:     24.50 Pulse rate:   82 / minute Pulse rhythm:   regular BP sitting:   132 / 82  (left arm) Cuff size:   regular  Vitals Entered By: Army Fossa CMA (Aug 27, 2009 3:12 PM) CC: Pt here for a hospital follow up   History of Present Illness: Pt here to f/u AMI, ventricular fibrillation arrest --- pt doing well now-- she is just tired.  Pt is now on cholesterol medication and she states Dr Riley Kill is going to follow her lipids.  ER, D/C summary and d/c instructions reviewed.  Current Medications (verified): 1)  Evista 60 Mg  Tabs (Raloxifene Hcl) .... Take 1 Tab Once Daily 2)  [facility]vs Vitamin D 2000 Unit Caps (Cholecalciferol) .... Daily 3)  Qc Womens Daily Multivitamin  Tabs (Multiple Vitamins-Minerals) .... With Iron Daily 4)  Caltrate 600 1500 Mg Tabs (Calcium Carbonate) .... Daily 5)  Ibuprofen 200 Mg Caps (Ibuprofen) .... 3 By Mouth Every 6 Hrs As Needed 6)  Alprazolam 0.25 Mg Tabs (Alprazolam) .... 1/2 To 1 Tab By Mouth Every 6 Hrs As Needed 7)  Aspirin 325 Mg Tabs (Aspirin) .Marland Kitchen.. 1 By Mouth Daily 8)  Lisinopril 10 Mg Tabs (Lisinopril) .Marland Kitchen.. 1 By Mouth Daily 9)  Metoprolol Succinate 25 Mg Xr24h-Tab (Metoprolol Succinate) .Marland Kitchen.. 1 By Mouth Daily 10)  Nitrostat 0.4 Mg Subl (Nitroglycerin) .Marland Kitchen.. 1 Tab Under Tongue Every 5 Mins As Needed Up 3 Doses As Needed. 11)  Pepcid 20 Mg Tabs (Famotidine) .Marland Kitchen.. 1 By Mouth Two Times A Day 12)  Effient 10 Mg Tabs (Prasugrel Hcl) .Marland Kitchen.. 1 By Mouth Daily 13)  Crestor 40 Mg Tabs (Rosuvastatin Calcium) .Marland Kitchen.. 1 By Mouth Every Evening. 14)  Tylenol Extra Strength 500 Mg Tabs (Acetaminophen) .... 2 By Mouth Every 6hrs As Needed.  Allergies: 1)  ! Sulfa 2)  ! Codeine  Past History:  Past medical, surgical, family and social histories (including risk factors) reviewed for relevance to current acute and chronic  problems.  Past Medical History: Reviewed history from 03/09/2008 and no changes required. Osteopenia Osteoarthritis GERD  Past Surgical History: Reviewed history from 11/21/2007 and no changes required. Tonsillectomy Appendectomy Hip fracture ORIF vagin vaginal births x3 R low leg surgery  Family History: Reviewed history from 11/21/2007 and no changes required. F--- 71yo died-- dementia M-----71 yo healthy Family History of Arthritis  Social History: Reviewed history from 11/21/2007 and no changes required. Retired---[employer] in [employer] Married Alcohol use-yes Drug use-no Regular exercise-no Former Smoker  Review of Systems      See HPI  Physical Exam  General:  Well-developed,well-nourished,in no acute distress; alert,appropriate and cooperative throughout examination Neck:  No deformities, masses, or tenderness noted. Lungs:  Normal respiratory effort, chest expands symmetrically. Lungs are clear to auscultation, no crackles or wheezes. Heart:  normal rate and no murmur.   Skin:  multiple bruises on arms from IV Psych:  Cognition and judgment appear intact. Alert and cooperative with normal attention span and concentration. No apparent delusions, illusions, hallucinations    Impression & Recommendations:  Problem # 1:  VENTRICULAR FIBRILLATION (ICD-427.41) Assessment Improved  Her updated medication list for this problem includes:    Aspirin 325 Mg Tabs (Aspirin) .Marland Kitchen... 1 by mouth daily    Metoprolol Succinate 25 Mg Xr24h-tab (Metoprolol succinate) .Marland KitchenMarland KitchenMarland KitchenMarland Kitchen  1 by mouth daily    Effient 10 Mg Tabs (Prasugrel hcl) .Marland Kitchen... 1 by mouth daily  Labs Reviewed: Na: 138 (07/10/2008)   K+: 4.8 (07/10/2008)   CL: 101 (07/10/2008)   HCO3: 31 (07/10/2008) Ca: 9.2 (07/10/2008)   TSH: 1.29 (07/10/2008)   HCO3: 31 (07/10/2008)  Problem # 2:  AMI (ICD-410.90) Assessment: New  Her updated medication list for this problem includes:    Aspirin 325 Mg Tabs (Aspirin) .Marland Kitchen... 1 by  mouth daily    Lisinopril 10 Mg Tabs (Lisinopril) .Marland Kitchen... 1 by mouth daily    Metoprolol Succinate 25 Mg Xr24h-tab (Metoprolol succinate) .Marland Kitchen... 1 by mouth daily    Nitrostat 0.4 Mg Subl (Nitroglycerin) .Marland Kitchen... 1 tab under tongue every 5 mins as needed up 3 doses as needed.    Effient 10 Mg Tabs (Prasugrel hcl) .Marland Kitchen... 1 by mouth daily  Labs Reviewed: Chol: 236 (11/21/2007)   HDL: 94.2 (11/21/2007)   LDL: DEL (11/21/2007)   TG: 64 (11/21/2007)  Problem # 3:  HYPOKALEMIA (ICD-276.8)  Orders: Venipuncture (04540) TLB-BMP (Basic Metabolic Panel-BMET) (80048-METABOL)  Problem # 4:  C A D (ICD-414.00)  Her updated medication list for this problem includes:    Aspirin 325 Mg Tabs (Aspirin) .Marland Kitchen... 1 by mouth daily    Lisinopril 10 Mg Tabs (Lisinopril) .Marland Kitchen... 1 by mouth daily    Metoprolol Succinate 25 Mg Xr24h-tab (Metoprolol succinate) .Marland Kitchen... 1 by mouth daily    Nitrostat 0.4 Mg Subl (Nitroglycerin) .Marland Kitchen... 1 tab under tongue every 5 mins as needed up 3 doses as needed.    Effient 10 Mg Tabs (Prasugrel hcl) .Marland Kitchen... 1 by mouth daily  Problem # 5:  HYPERLIPIDEMIA (ICD-272.4)  Her updated medication list for this problem includes:    Crestor 40 Mg Tabs (Rosuvastatin calcium) .Marland Kitchen... 1 by mouth every evening.  Labs Reviewed: SGOT: 22 (07/10/2008)   SGPT: 16 (07/10/2008)   HDL:94.2 (11/21/2007), 71.5 (06/16/2006)  LDL:DEL (11/21/2007), DEL (06/16/2006)  Chol:236 (11/21/2007), 203 (06/16/2006)  Trig:64 (11/21/2007), 64 (06/16/2006)  Complete Medication List: 1)  Evista 60 Mg Tabs (Raloxifene hcl) .... Take 1 tab once daily 2)  Cvs Vitamin D 2000 Unit Caps (Cholecalciferol) .... Daily 3)  Qc Womens Daily Multivitamin Tabs (Multiple vitamins-minerals) .... With iron daily 4)  Caltrate 600 1500 Mg Tabs (Calcium carbonate) .... Daily 5)  Ibuprofen 200 Mg Caps (Ibuprofen) .... 3 by mouth every 6 hrs as needed 6)  Alprazolam 0.25 Mg Tabs (Alprazolam) .... 1/2 to 1 tab by mouth every 6 hrs as needed 7)  Aspirin  325 Mg Tabs (Aspirin) .Marland Kitchen.. 1 by mouth daily 8)  Lisinopril 10 Mg Tabs (Lisinopril) .Marland Kitchen.. 1 by mouth daily 9)  Metoprolol Succinate 25 Mg Xr24h-tab (Metoprolol succinate) .Marland Kitchen.. 1 by mouth daily 10)  Nitrostat 0.4 Mg Subl (Nitroglycerin) .Marland Kitchen.. 1 tab under tongue every 5 mins as needed up 3 doses as needed. 11)  Pepcid 20 Mg Tabs (Famotidine) .Marland Kitchen.. 1 by mouth two times a day 12)  Effient 10 Mg Tabs (Prasugrel hcl) .Marland Kitchen.. 1 by mouth daily 13)  Crestor 40 Mg Tabs (Rosuvastatin calcium) .Marland Kitchen.. 1 by mouth every evening. 14)  Tylenol Extra Strength 500 Mg Tabs (Acetaminophen) .... 2 by mouth every 6hrs as needed.

## 2010-05-06 NOTE — Miscellaneous (Signed)
Summary: [facility]  [facility]   Imported By: Roderic Ovens 09/28/2009 11:08:15  _____________________________________________________________________  External Attachment:    Type:   Image     Comment:   External Document

## 2010-05-06 NOTE — Progress Notes (Signed)
Summary: Calling regarding medication  Phone Note Call from Patient Call back at Home Phone [phone]   Caller: Patient Summary of Call: Pt want to talk to someone about her mediction Initial call taken by: Judie Grieve,  December 23, 2009 1:22 PM  Follow-up for Phone Call        I spoke with the pt and she is continuing to have headaches from the Imdur.  I told the pt at this point she could discontinue the medication and I would have to speak with Dr Riley Kill for further recommendations.  Follow-up by: Julieta Gutting, RN, BSN,  December 23, 2009 1:43 PM  Additional Follow-up for Phone Call Additional follow up Details #1::        Per Dr Riley Kill discontinue Imdur.  No further medication recommendations at this time. Julieta Gutting, RN, BSN  December 23, 2009 7:45 PM  Pt aware of Dr Rosalyn Charters recommendations while in the office for [facility].  Additional Follow-up by: Julieta Gutting, RN, BSN,  December 25, 2009 12:27 PM    New/Updated Medications: NITROSTAT 0.4 MG SUBL (NITROGLYCERIN) 1 tab under tongue every 5 mins as needed up 3 doses as needed. Prescriptions: NITROSTAT 0.4 MG SUBL (NITROGLYCERIN) 1 tab under tongue every 5 mins as needed up 3 doses as needed.  #25 x 2   Entered by:   Julieta Gutting, RN, BSN   Authorized by:   Ronaldo Miyamoto, MD, [facility]   Signed by:   Julieta Gutting, RN, BSN on 12/25/2009   Method used:   Electronically to        [facility]  [address] [phone]* (retail)       [address]       [place], [place]  [address]       Ph: [phone] or [phone]       Fax: [phone]   RxID:   [id number]

## 2010-05-06 NOTE — Letter (Signed)
Summary: [facility]  [facility]   Imported By: Marylou Mccoy 10/11/2009 12:56:55  _____________________________________________________________________  External Attachment:    Type:   Image     Comment:   External Document

## 2010-05-06 NOTE — Miscellaneous (Signed)
Summary: [facility]  [facility]   Imported By: Lanelle Bal 08/05/2009 11:52:24  _____________________________________________________________________  External Attachment:    Type:   Image     Comment:   External Document

## 2010-05-06 NOTE — Miscellaneous (Signed)
Summary: Orders Update  Clinical Lists Changes 

## 2010-05-06 NOTE — Progress Notes (Signed)
Summary:  question about bruising  Phone Note Call from Patient Call back at Home Phone [phone]   Caller: Patient Summary of Call: Pt calling regarding her bruising Initial call taken by: Judie Grieve,  October 18, 2009 12:49 PM  Follow-up for Phone Call        I spoke with the pt and she was concerned because she has multiple bruises on her arms and legs and wanted to know if this was from her medication.  I told the pt that ASA and Plavix can make her bruise easier.  The pt said these bruises do not hurt.  The pt had a CBC drawn on 09/04/09 and Hemoglobin and PLT count were normal.  I told the pt to call the office if her bruising gets worse and we could check another CBC.  Pt agreed with plan.    Follow-up by: Julieta Gutting, RN, BSN,  October 18, 2009 4:10 PM

## 2010-05-08 NOTE — Assessment & Plan Note (Signed)
Summary: [name]   Visit Type:  Follow-up Primary Jamaiya Tunnell:  Laury Axon  CC:  No cardiac complaints.  History of Present Illness: Overall doing quite well.  Headed to the [place] later this week.  Denies any chest pain.  Lipids reveiwed in detail.  Overall, stable.   Problems Prior to Update: 1)  Hyponatremia  (ICD-276.1) 2)  Hyperlipidemia  (ICD-272.4) 3)  C A D  (ICD-414.00) 4)  AMI  (ICD-410.90) 5)  Hypokalemia  (ICD-276.8) 6)  Ventricular Fibrillation  (ICD-427.41) 7)  Sinusitis  (ICD-473.9) 8)  Compression Fracture, Lumbar Vertebrae  (ICD-805.4) 9)  Spinal Stenosis, Lumbar  (ICD-724.02) 10)  Unspecified Anemia  (ICD-285.9) 11)  Back Pain With Radiculopathy  (ICD-729.2) 12)  Abdominal Mass  (ICD-789.30) 13)  Shortness of Breath  (ICD-786.05) 14)  Fatigue  (ICD-780.79) 15)  Uri  (ICD-465.9) 16)  Shortness of Breath  (ICD-786.05) 17)  Gastroenteritis  (ICD-558.9) 18)  Gerd  (ICD-530.81) 19)  Tinea Corporis  (ICD-110.5) 20)  Unspecified Vitamin D Deficiency  (ICD-268.9) 21)  Preventive Health Care  (ICD-V70.0) 22)  Osteoarthritis  (ICD-715.90) 23)  Osteopenia  (ICD-733.90)  Current Medications (verified): 1)  Qc Womens Daily Multivitamin  Tabs (Multiple Vitamins-Minerals) .... With Iron Daily 2)  Ibuprofen 200 Mg Caps (Ibuprofen) .... 3 By Mouth Every 6 Hrs As Needed 3)  Aspirin Ec 325 Mg Tbec (Aspirin) .... Take One Tablet By Mouth Daily 4)  Metoprolol Succinate 25 Mg Xr24h-Tab (Metoprolol Succinate) .Marland Kitchen.. 1 By Mouth Daily 5)  Nitrostat 0.4 Mg Subl (Nitroglycerin) .Marland Kitchen.. 1 Tab Under Tongue Every 5 Mins As Needed Up 3 Doses As Needed. 6)  Pantoprazole Sodium 40 Mg Tbec (Pantoprazole Sodium) .... Once A Day 7)  Crestor 20 Mg Tabs (Rosuvastatin Calcium) .... Take One Tablet By Mouth Daily. 8)  Plavix 75 Mg Tabs (Clopidogrel Bisulfate) .... Take One Tablet By Mouth Daily 9)  Calcium 600+d Plus Minerals 600-400 Mg-Unit Tabs (Calcium Carbonate-Vit D-Min) .... Take 1 Tablet By  Mouth Two Times A Day  Allergies: 1)  ! Sulfa 2)  ! Codeine  Vital Signs:  Patient profile:   70 year old female Height:      66.5 inches Weight:      158.50 pounds BMI:     25.29 Pulse rate:   68 / minute Pulse rhythm:   regular Resp:     18 per minute BP sitting:   104 / 70  (left arm) Cuff size:   large  Vitals Entered By: Vikki Ports (March 25, 2010 10:15 AM)  Physical Exam  General:  Well developed, well nourished, in no acute distress. Head:  normocephalic and atraumatic Eyes:  PERRLA/EOM intact; conjunctiva and lids normal. Lungs:  Clear bilaterally to auscultation and percussion. Heart:  PMI non displaced. Normal S1 and S2.  Without murmur or rub.  No gallop. Extremities:  No clubbing or cyanosis. Neurologic:  Alert and oriented x 3.   Impression & Recommendations:  Problem # 1:  C A D (ICD-414.00) Doing very well.  Have told her she can reduce ASA to 81mg  per day at this point.  Otherwise continue the same.  Her updated medication list for this problem includes:    Aspirin Ec 325 Mg Tbec (Aspirin) .Marland Kitchen... Take one tablet by mouth daily    Metoprolol Succinate 25 Mg Xr24h-tab (Metoprolol succinate) .Marland Kitchen... 1 by mouth daily    Nitrostat 0.4 Mg Subl (Nitroglycerin) .Marland Kitchen... 1 tab under tongue every 5 mins as needed up 3 doses as needed.  Plavix 75 Mg Tabs (Clopidogrel bisulfate) .Marland Kitchen... Take one tablet by mouth daily  Problem # 2:  HYPERLIPIDEMIA (ICD-272.4) under guidelines at the present time.  Continue Crestor at 20mg .  Data reviewed. Copy given.  Her updated medication list for this problem includes:    Crestor 20 Mg Tabs (Rosuvastatin calcium) .Marland Kitchen... Take one tablet by mouth daily.  Patient Instructions: 1)  Your physician recommends that you schedule a follow-up appointment in: 3 MONTHS 2)  Your physician recommends that you continue on your current medications as directed. Please refer to the Current Medication list given to you today.

## 2010-05-08 NOTE — Miscellaneous (Signed)
Summary: Cardiac Rehab/[facility]  Cardiac Rehab/[facility]   Imported By: Lanelle Bal 03/07/2010 09:26:58  _____________________________________________________________________  External Attachment:    Type:   Image     Comment:   External Document

## 2010-05-08 NOTE — Assessment & Plan Note (Addendum)
Summary: EPH/MT   Primary Provider:  Laury Axon   History of Present Illness: Primary Cardiologist:  Dr. Shawnie Pons  [name] is a 70 yo female with a h/o CAD s/p anterior MI c/b VFib arrest in 08/2009 tx with a BMS to the LAD who was admx to [facility] 1/5-09/2010 with chest pain.  She ruled out for MI but with ongoing symptoms she was taken urgently for cardiac cath.  Her cath demonstrated a patent LAD stent, 70% stenosis in a small RI and nonobs disease elswhere.  She was tx medically.  She was not put on isosorbide due to prior intol.  She suspected MSK pain.  She presented back to [facility] on 1/8-01/2011 with recurrent chest pain.  Enzymes and EKGs were negative.  No further workup was pursued but she was noted to have a partially thrombosed right femoral artery pseudoaneurysm.  This was compressed.  GI saw her and rec. continued PPI without further workup and did not think her symptoms of chest pain were related to a GI etiology.  She returns for follow up.  She is doing well.  She denies recurrent chest pain.  She does have dyspnea with exertion.  This has been ongoing for several months now without increasing symptoms.  She denies syncope.  She denies orthopnea or PND.  She denies pedal edema.  Her right groin feels improved with less pain.  Current Medications (verified): 1)  Qc Womens Daily Multivitamin  Tabs (Multiple Vitamins-Minerals) .... With Iron Daily 2)  Ibuprofen 200 Mg Caps (Ibuprofen) .... 3 By Mouth Every 6 Hrs As Needed 3)  Aspir-Low 81 Mg Tbec (Aspirin) .Marland Kitchen.. 1 Once Daily 4)  Metoprolol Succinate 25 Mg Xr24h-Tab (Metoprolol Succinate) .Marland Kitchen.. 1 By Mouth Daily 5)  Nitrostat 0.4 Mg Subl (Nitroglycerin) .Marland Kitchen.. 1 Tab Under Tongue Every 5 Mins As Needed Up 3 Doses As Needed. 6)  Pantoprazole Sodium 40 Mg Tbec (Pantoprazole Sodium) .... Once A Day 7)  Crestor 20 Mg Tabs (Rosuvastatin Calcium) .... Take One Tablet By Mouth Daily. 8)  Plavix 75 Mg Tabs (Clopidogrel Bisulfate) .... Take One Tablet  By Mouth Daily 9)  Calcium 600+d Plus Minerals 600-400 Mg-Unit Tabs (Calcium Carbonate-Vit D-Min) .... Take 1 Tablet By Mouth Two Times A Day  Allergies: 1)  ! Sulfa 2)  ! Codeine  Past History:  Past Medical History:  1. Coronary artery disease, status post recent ventricular       fibrillation arrest and anterior myocardial infarction, Aug 14, 2009 with bare-metal stenting of the left anterior descending       coronary artery.       a.  cath 04/2010: LAD stent ok; dCFX 20-30%; RI 70% (small vessel) . . . med rx; normal LVF Echo 10/2009: EF 55-60%; ank HK; Grade 1 diast dysfx; mild LAE  2. Gastroesophageal reflux disease.   3. Osteoarthritis.   4. Osteopenia.   5. Left-sided second and sixth rib fractures.   6. T9 compression fracture.   7. Remote tobacco abuse.      Review of Systems       As per  the HPI.  All other systems reviewed and negative.   Vital Signs:  Patient profile:   70 year old female Height:      66.5 inches Weight:      157.50 pounds BMI:     25.13 Pulse rate:   58 / minute BP sitting:   144 / 80  (left arm) Cuff  size:   regular  Vitals Entered By: Scherrie Bateman, LPN (April 24, 2010 11:26 AM)  Physical Exam  General:  Well nourished, well developed, in no acute distress HEENT: normal Neck: no JVD Cardiac:  normal S1, S2; RRR; no murmur Lungs:  clear to auscultation bilaterally, no wheezing, rhonchi or rales Abd: soft, nontender, no hepatomegaly Ext: no edema; R groin with mild to mod ecchymoses; RFA site with + hematoma but no bruit Skin: warm and dry Neuro:  CNs 2-12 intact, no focal abnormalities noted    Impression & Recommendations:  Problem # 1:  HEMATOMA COMPLICATING A PROCEDURE NEC (ZOX-096.04) Improved. By exam, her pseudoaneurysm still appears to be resolved. She will notify us if she has any changes in or worsening of her symptoms.  Problem # 2:  C A D (ICD-414.00) She has had some atypical chest pains.  But, she has  not had any recurrent symptoms since d/c from the hospital.  I discussed with her possibly taking amlodipine.  Her BP would tolerate it today, but her BP generally runs lower.  If she has more symptoms in the future or more DOE, we could try putting her on amlodipine 2.5 mg a day to see if it helps. Followup with Dr. Riley Kill in 2 mos.  Problem # 3:  HYPERLIPIDEMIA (ICD-272.4) LDL < 70 in 02/2010.  Problem # 4:  GERD (ICD-530.81) Continue PPI.  Problem # 5:  SHORTNESS OF BREATH (ICD-786.05) This could be anginal or multifactorial.  The RI vessel was small and she is to be treated medically.  As above, consider amlodipine 2.5mg  in the future if it looks like her BP will tolerate it and she continues to have symptoms of chest pain.   She smoked for 20+ years.  If she has not had PFTs in the past, this could be considered as well.  Patient Instructions: 1)  Your physician recommends that you schedule a follow-up appointment in: KEEP MARCH 27 ,2012 APPT AT 10:00 AM WITH DR Riley Kill 2)  Your physician recommends that you continue on your current medications as directed. Please refer to the Current Medication list given to you today.

## 2010-05-21 ENCOUNTER — Encounter (INDEPENDENT_AMBULATORY_CARE_PROVIDER_SITE_OTHER): Payer: Medicare Other | Admitting: Family Medicine

## 2010-05-21 ENCOUNTER — Encounter: Payer: Self-pay | Admitting: Family Medicine

## 2010-05-21 ENCOUNTER — Other Ambulatory Visit: Payer: Self-pay | Admitting: Family Medicine

## 2010-05-21 DIAGNOSIS — E871 Hypo-osmolality and hyponatremia: Secondary | ICD-10-CM

## 2010-05-21 DIAGNOSIS — E876 Hypokalemia: Secondary | ICD-10-CM

## 2010-05-21 DIAGNOSIS — K219 Gastro-esophageal reflux disease without esophagitis: Secondary | ICD-10-CM

## 2010-05-21 DIAGNOSIS — D649 Anemia, unspecified: Secondary | ICD-10-CM

## 2010-05-21 DIAGNOSIS — E785 Hyperlipidemia, unspecified: Secondary | ICD-10-CM

## 2010-05-21 DIAGNOSIS — I219 Acute myocardial infarction, unspecified: Secondary | ICD-10-CM

## 2010-05-21 DIAGNOSIS — I2510 Atherosclerotic heart disease of native coronary artery without angina pectoris: Secondary | ICD-10-CM

## 2010-05-21 DIAGNOSIS — Z23 Encounter for immunization: Secondary | ICD-10-CM

## 2010-05-21 DIAGNOSIS — E559 Vitamin D deficiency, unspecified: Secondary | ICD-10-CM

## 2010-05-21 LAB — HEPATIC FUNCTION PANEL
ALT: 19 U/L (ref 0–35)
AST: 22 U/L (ref 0–37)
Albumin: 4 g/dL (ref 3.5–5.2)
Alkaline Phosphatase: 70 U/L (ref 39–117)
Bilirubin, Direct: 0.1 mg/dL (ref 0.0–0.3)
Total Bilirubin: 0.7 mg/dL (ref 0.3–1.2)
Total Protein: 6.8 g/dL (ref 6.0–8.3)

## 2010-05-21 LAB — BASIC METABOLIC PANEL
BUN: 10 mg/dL (ref 6–23)
CO2: 32 mEq/L (ref 19–32)
Calcium: 9.1 mg/dL (ref 8.4–10.5)
Chloride: 99 mEq/L (ref 96–112)
Creatinine, Ser: 0.7 mg/dL (ref 0.4–1.2)
GFR: 92.32 mL/min (ref >60.00)
Glucose, Bld: 82 mg/dL (ref 70–99)
Potassium: 4.5 mEq/L (ref 3.5–5.1)
Sodium: 137 mEq/L (ref 135–145)

## 2010-05-21 LAB — LIPID PANEL
Cholesterol: 157 mg/dL (ref 0–200)
HDL: 92.2 mg/dL (ref >39.00)
LDL Cholesterol: 58 mg/dL (ref 0–99)
Total CHOL/HDL Ratio: 2
Triglycerides: 36 mg/dL (ref 0.0–149.0)
VLDL: 7.2 mg/dL (ref 0.0–40.0)

## 2010-05-22 LAB — CBC WITH DIFFERENTIAL/PLATELET
Basophils Absolute: 0 10*3/uL (ref 0.0–0.1)
Basophils Relative: 0 % (ref 0.0–3.0)
Eosinophils Absolute: 0 10*3/uL (ref 0.0–0.7)
Eosinophils Relative: 0.6 % (ref 0.0–5.0)
HCT: 41.2 % (ref 36.0–46.0)
Hemoglobin: 13.9 g/dL (ref 12.0–15.0)
Lymphocytes Relative: 12.6 % (ref 12.0–46.0)
Lymphs Abs: 0.8 10*3/uL (ref 0.7–4.0)
MCHC: 33.9 g/dL (ref 30.0–36.0)
MCV: 103.3 fl — ABNORMAL HIGH (ref 78.0–100.0)
Monocytes Absolute: 0.6 10*3/uL (ref 0.1–1.0)
Monocytes Relative: 8.9 % (ref 3.0–12.0)
Neutro Abs: 4.9 10*3/uL (ref 1.4–7.7)
Neutrophils Relative %: 77.9 % — ABNORMAL HIGH (ref 43.0–77.0)
Platelets: 267 10*3/uL (ref 150.0–400.0)
RBC: 3.99 Mil/uL (ref 3.87–5.11)
RDW: 13.9 % (ref 11.5–14.6)
WBC: 6.3 10*3/uL (ref 4.5–10.5)

## 2010-05-22 LAB — CONVERTED CEMR LAB: Vit D, 25-Hydroxy: 40 ng/mL (ref 30–89)
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
## 2010-05-28 NOTE — Miscellaneous (Signed)
Summary: [facility] Progress Note   [facility]   Imported By: Roderic Ovens 05/21/2010 14:14:37  _____________________________________________________________________  External Attachment:    Type:   Image     Comment:   External Document

## 2010-05-30 ENCOUNTER — Encounter: Payer: Self-pay | Admitting: Family Medicine

## 2010-05-30 ENCOUNTER — Ambulatory Visit (INDEPENDENT_AMBULATORY_CARE_PROVIDER_SITE_OTHER): Payer: Medicare Other | Admitting: Family Medicine

## 2010-05-30 DIAGNOSIS — J449 Chronic obstructive pulmonary disease, unspecified: Secondary | ICD-10-CM

## 2010-05-30 DIAGNOSIS — R0602 Shortness of breath: Secondary | ICD-10-CM

## 2010-06-03 NOTE — Assessment & Plan Note (Signed)
Summary: PFT/cbs   Vital Signs:  Patient profile:   70 year old female Weight:      158.6 pounds O2 Sat:      96 % on Room air Pulse rate:   78 / minute Pulse rhythm:   regular BP sitting:   122 / 82  (right arm) Cuff size:   regular  Vitals Entered By: Almeta Monas CMA Duncan Dull) (May 30, 2010 2:14 PM)  O2 Flow:  Room air CC: PFT's   History of Present Illness: Pt here for PFTs for DOE and hx TOB use.     Preventive Screening-Counseling & Management  Alcohol-Tobacco     Smoking Status: quit  Current Medications (verified): 1)  Qc Womens Daily Multivitamin  Tabs (Multiple Vitamins-Minerals) .... With Iron Daily 2)  Tylenol Extra Strength 500 Mg Tabs (Acetaminophen) .Marland Kitchen.. 1-2 By Mouth Every 6 Hours As Needed 3)  Aspir-Low 81 Mg Tbec (Aspirin) .Marland Kitchen.. 1 Once Daily 4)  Metoprolol Succinate 25 Mg Xr24h-Tab (Metoprolol Succinate) .Marland Kitchen.. 1 By Mouth Daily 5)  Nitrostat 0.4 Mg Subl (Nitroglycerin) .Marland Kitchen.. 1 Tab Under Tongue Every 5 Mins As Needed Up 3 Doses As Needed. 6)  Pantoprazole Sodium 40 Mg Tbec (Pantoprazole Sodium) .... Once A Day 7)  Crestor 20 Mg Tabs (Rosuvastatin Calcium) .... Take One Tablet By Mouth Daily. 8)  Plavix 75 Mg Tabs (Clopidogrel Bisulfate) .... Take One Tablet By Mouth Daily 9)  Calcium 600+d Plus Minerals 600-400 Mg-Unit Tabs (Calcium Carbonate-Vit D-Min) .... Take 1 Tablet By Mouth Two Times A Day 10)  Advair Diskus 250-50 Mcg/dose Aepb (Fluticasone-Salmeterol) .Marland Kitchen.. 1 Inh Two Times A Day  Allergies (verified): 1)  ! Sulfa 2)  ! Codeine  Past History:  Past medical, surgical, family and social histories (including risk factors) reviewed for relevance to current acute and chronic problems.  Past Medical History: Reviewed history from 04/24/2010 and no changes required.  1. Coronary artery disease, status post recent ventricular       fibrillation arrest and anterior myocardial infarction, Aug 14, 2009 with bare-metal stenting of the left anterior  descending       coronary artery.       a.  cath 04/2010: LAD stent ok; dCFX 20-30%; RI 70% (small vessel) . . . med rx; normal LVF Echo 10/2009: EF 55-60%; ank HK; Grade 1 diast dysfx; mild LAE  2. Gastroesophageal reflux disease.   3. Osteoarthritis.   4. Osteopenia.   5. Left-sided second and sixth rib fractures.   6. T9 compression fracture.   7. Remote tobacco abuse.      Past Surgical History: Reviewed history from 08/30/2009 and no changes required.  bare-metal stenting of the left anterior descending   coronary artery.  Tonsillectomy Appendectomy Hip fracture ORIF vagin vaginal births x3 R low leg surgery  back surgery, repair of      Family History: Reviewed history from 05/21/2010 and no changes required.   Her mother is alive at 81.  Her father died at 47.   Neither her parents nor any siblings have a history of coronary artery  disease. -- updated  05/21/2010  Social History: Reviewed history from 05/21/2010 and no changes required.  She lives in [place] with her husband  She is a retired Clinical research associate.  She has approximately a 20-   pack-year history of tobacco use but quit more than 20 years ago.  She  has no history of alcohol or drug abuse.  Drug use-no Regular exercise-yes  Physical Exam  General:  Well-developed,well-nourished,in no acute distress; alert,appropriate and cooperative throughout examination Lungs:  Normal respiratory effort, chest expands symmetrically. Lungs are clear to auscultation, no crackles or wheezes. Heart:  normal rate and no murmur.   Psych:  Cognition and judgment appear intact. Alert and cooperative with normal attention span and concentration. No apparent delusions, illusions, hallucinations   Impression & Recommendations:  Problem # 1:  COPD (ICD-496)  Her updated medication list for this problem includes:    Advair Diskus 250-50 Mcg/dose Aepb (Fluticasone-salmeterol) .Marland Kitchen... 1 inh two times a day  Pulmonary Functions  Reviewed: O2 sat: 96 (05/30/2010)     Vaccines Reviewed: Pneumovax: Pneumovax (05/21/2006)   Flu Vax: given (03/04/2010)  Orders: Spirometry (Pre & Post) (16109)  Complete Medication List: 1)  Qc Womens Daily Multivitamin Tabs (Multiple vitamins-minerals) .... With iron daily 2)  Tylenol Extra Strength 500 Mg Tabs (Acetaminophen) .Marland Kitchen.. 1-2 by mouth every 6 hours as needed 3)  Aspir-low 81 Mg Tbec (Aspirin) .Marland Kitchen.. 1 once daily 4)  Metoprolol Succinate 25 Mg Xr24h-tab (Metoprolol succinate) .Marland Kitchen.. 1 by mouth daily 5)  Nitrostat 0.4 Mg Subl (Nitroglycerin) .Marland Kitchen.. 1 tab under tongue every 5 mins as needed up 3 doses as needed. 6)  Pantoprazole Sodium 40 Mg Tbec (Pantoprazole sodium) .... Once a day 7)  Crestor 20 Mg Tabs (Rosuvastatin calcium) .... Take one tablet by mouth daily. 8)  Plavix 75 Mg Tabs (Clopidogrel bisulfate) .... Take one tablet by mouth daily 9)  Calcium 600+d Plus Minerals 600-400 Mg-unit Tabs (Calcium carbonate-vit d-min) .... Take 1 tablet by mouth two times a day 10)  Advair Diskus 250-50 Mcg/dose Aepb (Fluticasone-salmeterol) .Marland Kitchen.. 1 inh two times a day  Other Orders: Albuterol Sulfate Sol 1mg  unit dose (U0454) Nebulizer Tx (09811)  Patient Instructions: 1)  Please schedule a follow-up appointment in 1 month.  Prescriptions: ADVAIR DISKUS 250-50 MCG/DOSE AEPB (FLUTICASONE-SALMETEROL) 1 inh two times a day  #1 x 5   Entered and Authorized by:   Loreen Freud DO   Signed by:   Loreen Freud DO on 05/30/2010   Method used:   Print then Give to Patient   RxID:   [id number]    Medication Administration  Medication # 1:    Medication: Albuterol Sulfate Sol 1mg  unit dose    Diagnosis: SHORTNESS OF BREATH (ICD-786.05)    Dose: 2.5mg /40ml    Route: inhaled    Exp Date: 10/05/2011    Lot #: [id number]    Mfr: nephron    Patient tolerated medication without complications    Given by: Almeta Monas CMA Duncan Dull) (May 30, 2010 2:39 PM)  Orders Added: 1)  Albuterol  Sulfate Sol 1mg  unit dose [J7613] 2)  Nebulizer Tx [94640] 3)  Est. Patient Level IV [29528] 4)  Spirometry (Pre & Post) [94060]

## 2010-06-18 ENCOUNTER — Encounter: Payer: Self-pay | Admitting: Family Medicine

## 2010-06-19 LAB — COMPREHENSIVE METABOLIC PANEL
ALT: 20 U/L (ref 0–35)
AST: 23 U/L (ref 0–37)
Albumin: 3.6 g/dL (ref 3.5–5.2)
Alkaline Phosphatase: 70 U/L (ref 39–117)
BUN: 8 mg/dL (ref 6–23)
CO2: 26 mEq/L (ref 19–32)
Calcium: 8.7 mg/dL (ref 8.4–10.5)
Chloride: 100 mEq/L (ref 96–112)
Creatinine, Ser: 0.59 mg/dL (ref 0.4–1.2)
GFR calc Af Amer: >60 mL/min (ref >60)
GFR calc non Af Amer: >60 mL/min (ref >60)
Glucose, Bld: 82 mg/dL (ref 70–99)
Potassium: 3.8 mEq/L (ref 3.5–5.1)
Sodium: 132 mEq/L — ABNORMAL LOW (ref 135–145)
Total Bilirubin: 0.5 mg/dL (ref 0.3–1.2)
Total Protein: 6.4 g/dL (ref 6.0–8.3)

## 2010-06-19 LAB — CBC
HCT: 35.3 % — ABNORMAL LOW (ref 36.0–46.0)
HCT: 36.6 % (ref 36.0–46.0)
HCT: 37.5 % (ref 36.0–46.0)
Hemoglobin: 12 g/dL (ref 12.0–15.0)
Hemoglobin: 12.8 g/dL (ref 12.0–15.0)
Hemoglobin: 12.9 g/dL (ref 12.0–15.0)
MCH: 33.7 pg (ref 26.0–34.0)
MCH: 34 pg (ref 26.0–34.0)
MCH: 34.4 pg — ABNORMAL HIGH (ref 26.0–34.0)
MCHC: 34 g/dL (ref 30.0–36.0)
MCHC: 34.1 g/dL (ref 30.0–36.0)
MCHC: 35.2 g/dL (ref 30.0–36.0)
MCV: 97.6 fL (ref 78.0–100.0)
MCV: 99.2 fL (ref 78.0–100.0)
MCV: 99.5 fL (ref 78.0–100.0)
Platelets: 216 10*3/uL (ref 150–400)
Platelets: 225 10*3/uL (ref 150–400)
Platelets: 235 10*3/uL (ref 150–400)
RBC: 3.56 MIL/uL — ABNORMAL LOW (ref 3.87–5.11)
RBC: 3.75 MIL/uL — ABNORMAL LOW (ref 3.87–5.11)
RBC: 3.77 MIL/uL — ABNORMAL LOW (ref 3.87–5.11)
RDW: 12.5 % (ref 11.5–15.5)
RDW: 12.8 % (ref 11.5–15.5)
RDW: 12.9 % (ref 11.5–15.5)
WBC: 4.1 10*3/uL (ref 4.0–10.5)
WBC: 4.5 10*3/uL (ref 4.0–10.5)
WBC: 5.5 10*3/uL (ref 4.0–10.5)

## 2010-06-19 LAB — HEPARIN LEVEL (UNFRACTIONATED)
Heparin Unfractionated: 0.5 IU/mL (ref 0.30–0.70)
Heparin Unfractionated: 0.51 IU/mL (ref 0.30–0.70)
Heparin Unfractionated: 0.53 IU/mL (ref 0.30–0.70)

## 2010-06-19 LAB — CARDIAC PANEL(CRET KIN+CKTOT+MB+TROPI)
CK, MB: 1.8 ng/mL (ref 0.3–4.0)
CK, MB: 1.9 ng/mL (ref 0.3–4.0)
Relative Index: INVALID (ref 0.0–2.5)
Relative Index: INVALID (ref 0.0–2.5)
Total CK: 57 U/L (ref 7–177)
Total CK: 68 U/L (ref 7–177)
Troponin I: 0.01 ng/mL (ref 0.00–0.06)
Troponin I: 0.01 ng/mL (ref 0.00–0.06)

## 2010-06-19 LAB — D-DIMER, QUANTITATIVE: D-Dimer, Quant: <0.22 ug/mL-FEU (ref 0.00–0.48)

## 2010-06-19 LAB — MAGNESIUM: Magnesium: 2.1 mg/dL (ref 1.5–2.5)

## 2010-06-19 LAB — POCT CARDIAC MARKERS
CKMB, poc: <1 ng/mL — ABNORMAL LOW (ref 1.0–8.0)
Myoglobin, poc: 43.5 ng/mL (ref 12–200)
Troponin i, poc: <0.05 ng/mL (ref 0.00–0.09)

## 2010-06-19 LAB — CK TOTAL AND CKMB (NOT AT ARMC)
CK, MB: 2 ng/mL (ref 0.3–4.0)
Relative Index: INVALID (ref 0.0–2.5)
Total CK: 83 U/L (ref 7–177)

## 2010-06-19 LAB — MRSA PCR SCREENING: MRSA by PCR: NEGATIVE

## 2010-06-19 LAB — APTT: aPTT: 30 seconds (ref 24–37)

## 2010-06-19 LAB — PROTIME-INR
INR: 0.96 (ref 0.00–1.49)
Prothrombin Time: 13 seconds (ref 11.6–15.2)

## 2010-06-19 LAB — TROPONIN I: Troponin I: 0.01 ng/mL (ref 0.00–0.06)

## 2010-06-19 LAB — TSH: TSH: 2.313 u[IU]/mL (ref 0.350–4.500)

## 2010-06-23 LAB — CBC
HCT: 34.2 % — ABNORMAL LOW (ref 36.0–46.0)
Hemoglobin: 12.1 g/dL (ref 12.0–15.0)
MCHC: 35.4 g/dL (ref 30.0–36.0)
MCV: 101.6 fL — ABNORMAL HIGH (ref 78.0–100.0)
Platelets: 321 10*3/uL (ref 150–400)
RBC: 3.37 MIL/uL — ABNORMAL LOW (ref 3.87–5.11)
RDW: 13.6 % (ref 11.5–15.5)
WBC: 6 10*3/uL (ref 4.0–10.5)

## 2010-06-23 LAB — URINALYSIS, ROUTINE W REFLEX MICROSCOPIC
Bilirubin Urine: NEGATIVE
Glucose, UA: NEGATIVE mg/dL
Hgb urine dipstick: NEGATIVE
Ketones, ur: NEGATIVE mg/dL
Nitrite: NEGATIVE
Protein, ur: NEGATIVE mg/dL
Specific Gravity, Urine: 1.017 (ref 1.005–1.030)
Urobilinogen, UA: 0.2 mg/dL (ref 0.0–1.0)
pH: 7 (ref 5.0–8.0)

## 2010-06-23 LAB — PROTIME-INR
INR: 0.99 (ref 0.00–1.49)
Prothrombin Time: 13 seconds (ref 11.6–15.2)

## 2010-06-23 LAB — POCT CARDIAC MARKERS
CKMB, poc: 1 ng/mL (ref 1.0–8.0)
CKMB, poc: <1 ng/mL — ABNORMAL LOW (ref 1.0–8.0)
Myoglobin, poc: 38.9 ng/mL (ref 12–200)
Myoglobin, poc: 44.1 ng/mL (ref 12–200)
Troponin i, poc: <0.05 ng/mL (ref 0.00–0.09)
Troponin i, poc: <0.05 ng/mL (ref 0.00–0.09)

## 2010-06-23 LAB — DIFFERENTIAL
Basophils Absolute: 0 10*3/uL (ref 0.0–0.1)
Basophils Relative: 1 % (ref 0–1)
Eosinophils Absolute: 0.1 10*3/uL (ref 0.0–0.7)
Eosinophils Relative: 1 % (ref 0–5)
Lymphocytes Relative: 24 % (ref 12–46)
Lymphs Abs: 1.4 10*3/uL (ref 0.7–4.0)
Monocytes Absolute: 0.8 10*3/uL (ref 0.1–1.0)
Monocytes Relative: 13 % — ABNORMAL HIGH (ref 3–12)
Neutro Abs: 3.7 10*3/uL (ref 1.7–7.7)
Neutrophils Relative %: 62 % (ref 43–77)

## 2010-06-23 LAB — POCT I-STAT, CHEM 8
BUN: 16 mg/dL (ref 6–23)
Calcium, Ion: 1.08 mmol/L — ABNORMAL LOW (ref 1.12–1.32)
Chloride: 99 meq/L (ref 96–112)
Creatinine, Ser: 0.8 mg/dL (ref 0.4–1.2)
Glucose, Bld: 101 mg/dL — ABNORMAL HIGH (ref 70–99)
HCT: 38 % (ref 36.0–46.0)
Hemoglobin: 12.9 g/dL (ref 12.0–15.0)
Potassium: 5 meq/L (ref 3.5–5.1)
Sodium: 131 meq/L — ABNORMAL LOW (ref 135–145)
TCO2: 27 mmol/L (ref 0–100)

## 2010-06-23 LAB — APTT: aPTT: 31 seconds (ref 24–37)

## 2010-06-23 LAB — CARDIAC PANEL(CRET KIN+CKTOT+MB+TROPI)
CK, MB: 1.1 ng/mL (ref 0.3–4.0)
CK, MB: 1.2 ng/mL (ref 0.3–4.0)
Relative Index: INVALID (ref 0.0–2.5)
Relative Index: INVALID (ref 0.0–2.5)
Total CK: 27 U/L (ref 7–177)
Total CK: 28 U/L (ref 7–177)
Troponin I: 0.02 ng/mL (ref 0.00–0.06)
Troponin I: 0.02 ng/mL (ref 0.00–0.06)

## 2010-06-23 LAB — BASIC METABOLIC PANEL
BUN: 13 mg/dL (ref 6–23)
CO2: 31 mEq/L (ref 19–32)
Calcium: 8.7 mg/dL (ref 8.4–10.5)
Chloride: 93 mEq/L — ABNORMAL LOW (ref 96–112)
Creatinine, Ser: 0.68 mg/dL (ref 0.4–1.2)
GFR calc Af Amer: >60 mL/min (ref >60)
GFR calc non Af Amer: >60 mL/min (ref >60)
Glucose, Bld: 98 mg/dL (ref 70–99)
Potassium: 4.2 mEq/L (ref 3.5–5.1)
Sodium: 129 mEq/L — ABNORMAL LOW (ref 135–145)

## 2010-06-23 LAB — BRAIN NATRIURETIC PEPTIDE: Pro B Natriuretic peptide (BNP): 143 pg/mL — ABNORMAL HIGH (ref 0.0–100.0)

## 2010-06-23 LAB — HEMOCCULT GUIAC POC 1CARD (OFFICE): Fecal Occult Bld: NEGATIVE

## 2010-06-24 LAB — BASIC METABOLIC PANEL
BUN: 2 mg/dL — ABNORMAL LOW (ref 6–23)
CO2: 30 mEq/L (ref 19–32)
Calcium: 8.3 mg/dL — ABNORMAL LOW (ref 8.4–10.5)
Chloride: 101 mEq/L (ref 96–112)
Creatinine, Ser: 0.48 mg/dL (ref 0.4–1.2)
GFR calc Af Amer: >60 mL/min (ref >60)
GFR calc non Af Amer: >60 mL/min (ref >60)
Glucose, Bld: 99 mg/dL (ref 70–99)
Potassium: 3.7 mEq/L (ref 3.5–5.1)
Sodium: 135 mEq/L (ref 135–145)

## 2010-06-24 LAB — CBC
HCT: 34.5 % — ABNORMAL LOW (ref 36.0–46.0)
HCT: 43 % (ref 36.0–46.0)
Hemoglobin: 12 g/dL (ref 12.0–15.0)
Hemoglobin: 15 g/dL (ref 12.0–15.0)
MCHC: 34.9 g/dL (ref 30.0–36.0)
MCHC: 35 g/dL (ref 30.0–36.0)
MCV: 102.1 fL — ABNORMAL HIGH (ref 78.0–100.0)
MCV: 102.5 fL — ABNORMAL HIGH (ref 78.0–100.0)
Platelets: 210 10*3/uL (ref 150–400)
Platelets: 260 10*3/uL (ref 150–400)
RBC: 3.37 MIL/uL — ABNORMAL LOW (ref 3.87–5.11)
RBC: 4.21 MIL/uL (ref 3.87–5.11)
RDW: 14.1 % (ref 11.5–15.5)
RDW: 14.2 % (ref 11.5–15.5)
WBC: 6.1 10*3/uL (ref 4.0–10.5)
WBC: 9.2 10*3/uL (ref 4.0–10.5)

## 2010-06-24 LAB — PROTIME-INR
INR: 1 (ref 0.00–1.49)
Prothrombin Time: 13.1 seconds (ref 11.6–15.2)

## 2010-06-24 LAB — POCT I-STAT, CHEM 8
BUN: 8 mg/dL (ref 6–23)
Calcium, Ion: 1.07 mmol/L — ABNORMAL LOW (ref 1.12–1.32)
Chloride: 99 mEq/L (ref 96–112)
Creatinine, Ser: 0.7 mg/dL (ref 0.4–1.2)
Glucose, Bld: 104 mg/dL — ABNORMAL HIGH (ref 70–99)
HCT: 46 % (ref 36.0–46.0)
Hemoglobin: 15.6 g/dL — ABNORMAL HIGH (ref 12.0–15.0)
Potassium: 4.2 mEq/L (ref 3.5–5.1)
Sodium: 133 mEq/L — ABNORMAL LOW (ref 135–145)
TCO2: 27 mmol/L (ref 0–100)

## 2010-06-24 LAB — LIPID PANEL
Cholesterol: 160 mg/dL (ref 0–200)
HDL: 81 mg/dL (ref >39)
LDL Cholesterol: 68 mg/dL (ref 0–99)
Total CHOL/HDL Ratio: 2 RATIO
Triglycerides: 57 mg/dL (ref <150)
VLDL: 11 mg/dL (ref 0–40)

## 2010-06-24 LAB — DIFFERENTIAL
Basophils Absolute: 0 10*3/uL (ref 0.0–0.1)
Basophils Relative: 0 % (ref 0–1)
Eosinophils Absolute: 0 10*3/uL (ref 0.0–0.7)
Eosinophils Relative: 0 % (ref 0–5)
Lymphocytes Relative: 30 % (ref 12–46)
Lymphs Abs: 2.8 10*3/uL (ref 0.7–4.0)
Monocytes Absolute: 0.7 10*3/uL (ref 0.1–1.0)
Monocytes Relative: 8 % (ref 3–12)
Neutro Abs: 5.6 10*3/uL (ref 1.7–7.7)
Neutrophils Relative %: 61 % (ref 43–77)

## 2010-06-24 LAB — CARDIAC PANEL(CRET KIN+CKTOT+MB+TROPI)
CK, MB: 1.2 ng/mL (ref 0.3–4.0)
CK, MB: 31.9 ng/mL (ref 0.3–4.0)
CK, MB: 35.1 ng/mL (ref 0.3–4.0)
Relative Index: 13.5 — ABNORMAL HIGH (ref 0.0–2.5)
Relative Index: 13.9 — ABNORMAL HIGH (ref 0.0–2.5)
Relative Index: INVALID (ref 0.0–2.5)
Total CK: 230 U/L — ABNORMAL HIGH (ref 7–177)
Total CK: 260 U/L — ABNORMAL HIGH (ref 7–177)
Total CK: 58 U/L (ref 7–177)
Troponin I: 0.03 ng/mL (ref 0.00–0.06)
Troponin I: 4.93 ng/mL (ref 0.00–0.06)
Troponin I: 5.13 ng/mL (ref 0.00–0.06)

## 2010-06-24 LAB — COMPREHENSIVE METABOLIC PANEL
ALT: 36 U/L — ABNORMAL HIGH (ref 0–35)
AST: 53 U/L — ABNORMAL HIGH (ref 0–37)
Albumin: 3.8 g/dL (ref 3.5–5.2)
Alkaline Phosphatase: 92 U/L (ref 39–117)
BUN: 8 mg/dL (ref 6–23)
CO2: 25 mEq/L (ref 19–32)
Calcium: 9 mg/dL (ref 8.4–10.5)
Chloride: 98 mEq/L (ref 96–112)
Creatinine, Ser: 0.72 mg/dL (ref 0.4–1.2)
GFR calc Af Amer: >60 mL/min (ref >60)
GFR calc non Af Amer: >60 mL/min (ref >60)
Glucose, Bld: 106 mg/dL — ABNORMAL HIGH (ref 70–99)
Potassium: 4.2 mEq/L (ref 3.5–5.1)
Sodium: 134 mEq/L — ABNORMAL LOW (ref 135–145)
Total Bilirubin: 1.2 mg/dL (ref 0.3–1.2)
Total Protein: 7.4 g/dL (ref 6.0–8.3)

## 2010-06-24 LAB — MAGNESIUM: Magnesium: 2 mg/dL (ref 1.5–2.5)

## 2010-06-24 LAB — APTT: aPTT: 26 seconds (ref 24–37)

## 2010-06-24 LAB — MRSA PCR SCREENING: MRSA by PCR: NEGATIVE

## 2010-06-24 LAB — CK TOTAL AND CKMB (NOT AT ARMC)
CK, MB: 1.5 ng/mL (ref 0.3–4.0)
Relative Index: INVALID (ref 0.0–2.5)
Total CK: 79 U/L (ref 7–177)

## 2010-06-24 LAB — TSH: TSH: 1.692 u[IU]/mL (ref 0.350–4.500)

## 2010-06-24 LAB — TROPONIN I: Troponin I: 0.02 ng/mL (ref 0.00–0.06)

## 2010-06-24 LAB — HEMOGLOBIN A1C
Hgb A1c MFr Bld: 5.7 % — ABNORMAL HIGH (ref <5.7)
Mean Plasma Glucose: 117 mg/dL — ABNORMAL HIGH (ref <117)

## 2010-06-25 LAB — CBC
HCT: 40 % (ref 36.0–46.0)
Hemoglobin: 14 g/dL (ref 12.0–15.0)
MCHC: 35 g/dL (ref 30.0–36.0)
MCV: 100.8 fL — ABNORMAL HIGH (ref 78.0–100.0)
Platelets: 252 10*3/uL (ref 150–400)
RBC: 3.97 MIL/uL (ref 3.87–5.11)
RDW: 12.7 % (ref 11.5–15.5)
WBC: 5.4 10*3/uL (ref 4.0–10.5)

## 2010-06-25 LAB — DIFFERENTIAL
Basophils Absolute: 0 10*3/uL (ref 0.0–0.1)
Basophils Relative: 0 % (ref 0–1)
Eosinophils Absolute: 0 10*3/uL (ref 0.0–0.7)
Eosinophils Relative: 0 % (ref 0–5)
Lymphocytes Relative: 19 % (ref 12–46)
Lymphs Abs: 1 10*3/uL (ref 0.7–4.0)
Monocytes Absolute: 0.6 10*3/uL (ref 0.1–1.0)
Monocytes Relative: 11 % (ref 3–12)
Neutro Abs: 3.8 10*3/uL (ref 1.7–7.7)
Neutrophils Relative %: 70 % (ref 43–77)

## 2010-06-25 LAB — TYPE AND SCREEN
ABO/RH(D): A POS
Antibody Screen: NEGATIVE

## 2010-06-25 LAB — SURGICAL PCR SCREEN
MRSA, PCR: NEGATIVE
Staphylococcus aureus: NEGATIVE

## 2010-06-25 LAB — ABO/RH: ABO/RH(D): A POS

## 2010-07-01 ENCOUNTER — Ambulatory Visit: Payer: Self-pay | Admitting: Cardiology

## 2010-07-04 ENCOUNTER — Encounter: Payer: Self-pay | Admitting: Family Medicine

## 2010-07-04 ENCOUNTER — Ambulatory Visit (INDEPENDENT_AMBULATORY_CARE_PROVIDER_SITE_OTHER): Payer: Medicare Other | Admitting: Family Medicine

## 2010-07-04 DIAGNOSIS — M8580 Other specified disorders of bone density and structure, unspecified site: Secondary | ICD-10-CM

## 2010-07-04 DIAGNOSIS — J449 Chronic obstructive pulmonary disease, unspecified: Secondary | ICD-10-CM

## 2010-07-04 MED ORDER — ZOLEDRONIC ACID 5 MG/100ML IV SOLN: 5.0000 mg | Freq: Once | INTRAVENOUS | Status: AC

## 2010-07-04 NOTE — Progress Notes (Signed)
  Subjective:    Patient ID: [name], female    DOB: [birth year], 70 y.o.   MRN: [id number]  HPI Pt here f/u copd.  She is doing better with advair but she is not sure if its the advair or the fact that the weather is changing.   Review of Systems No complaints     Objective:   Physical Exam  Constitutional: She appears well-developed and well-nourished. No distress.  Pulmonary/Chest: Effort normal and breath sounds normal.  Psychiatric: She has a normal mood and affect. Thought content normal.          Assessment & Plan:  cope

## 2010-07-04 NOTE — Assessment & Plan Note (Signed)
Improved with advair rto 6 months

## 2010-07-09 ENCOUNTER — Ambulatory Visit: Payer: Self-pay | Admitting: Cardiology

## 2010-07-10 ENCOUNTER — Ambulatory Visit (HOSPITAL_COMMUNITY): Payer: Medicare Other | Attending: Obstetrics and Gynecology

## 2010-07-10 DIAGNOSIS — M810 Age-related osteoporosis without current pathological fracture: Secondary | ICD-10-CM | POA: Insufficient documentation

## 2010-07-11 LAB — CBC
HCT: 39.6 % (ref 36.0–46.0)
Hemoglobin: 13.4 g/dL (ref 12.0–15.0)
MCHC: 33.8 g/dL (ref 30.0–36.0)
MCV: 98.3 fL (ref 78.0–100.0)
Platelets: 395 10*3/uL (ref 150–400)
RBC: 4.03 MIL/uL (ref 3.87–5.11)
RDW: 13.6 % (ref 11.5–15.5)
WBC: 6 10*3/uL (ref 4.0–10.5)

## 2010-07-18 ENCOUNTER — Other Ambulatory Visit: Payer: Self-pay | Admitting: *Deleted

## 2010-07-18 MED ORDER — FLUTICASONE-SALMETEROL 250-50 MCG/DOSE IN AEPB: 1.0000 | INHALATION_SPRAY | Freq: Two times a day (BID) | RESPIRATORY_TRACT | Status: DC

## 2010-07-18 NOTE — Telephone Encounter (Signed)
What med is the pt requesting?

## 2010-07-22 ENCOUNTER — Ambulatory Visit (INDEPENDENT_AMBULATORY_CARE_PROVIDER_SITE_OTHER): Payer: Medicare Other | Admitting: Cardiology

## 2010-07-22 VITALS — BP 100/60 | HR 57 | Ht 66.0 in | Wt 155.0 lb

## 2010-07-22 DIAGNOSIS — E785 Hyperlipidemia, unspecified: Secondary | ICD-10-CM

## 2010-07-22 DIAGNOSIS — R002 Palpitations: Secondary | ICD-10-CM

## 2010-07-22 DIAGNOSIS — I2510 Atherosclerotic heart disease of native coronary artery without angina pectoris: Secondary | ICD-10-CM

## 2010-07-22 NOTE — Progress Notes (Signed)
HPI:  She is doing well.  She does have a lot of bruising and wanders about how long on plavix.  Denies chest pain.  She does note some palpitations which I are not sustained, and  I have encouraged her to check by checking her pulse.  Now taking advair for some COPD.   Current Outpatient Prescriptions  Medication Sig Dispense Refill  . acetaminophen (TYLENOL) 500 MG tablet Take by mouth every 6 (six) hours as needed. 1 to 2       . aspirin 81 MG EC tablet Take 81 mg by mouth daily.        . Calcium Carbonate-Vitamin D 600-400 MG-UNIT per tablet Take 1 tablet by mouth daily.        . clopidogrel (PLAVIX) 75 MG tablet Take 75 mg by mouth daily.        . Fluticasone-Salmeterol (ADVAIR DISKUS) 250-50 MCG/DOSE AEPB Inhale 1 puff into the lungs 2 (two) times daily.  180 each  2  . metoprolol succinate (TOPROL-XL) 25 MG 24 hr tablet Take 25 mg by mouth daily.        . Multiple Vitamins-Minerals (QC WOMENS DAILY MULTIVITAMIN) TABS Take by mouth daily. with iron       . nitroGLYCERIN (NITROSTAT) 0.4 MG SL tablet Place 0.4 mg under the tongue every 5 (five) minutes as needed.        . pantoprazole (PROTONIX) 40 MG tablet Take 40 mg by mouth daily.        . rosuvastatin (CRESTOR) 20 MG tablet Take 20 mg by mouth daily.        . Zoledronic Acid (RECLAST IV) Inject into the vein. Yearly          Allergies  Allergen Reactions  . Codeine   . Sulfonamide Derivatives     Past Medical History  Diagnosis Date  . Coronary artery disease 08-14-09    status post recent ventricular fibrillation arrest and anterior myocardial infaction with bare-metal stenting of the left anterior   descending cornonary artery  . Hx of heart artery stent     LAD, dCFX 20-30%; RI 70% (small vessel) ..med  Rx;;  normal LVF Echo 10/2009; EF55-60%; ank; Grade 1 diast dysfx; mild LAE  . GERD (gastroesophageal reflux disease)   . Osteoarthritis   . Osteopenia   . Fracture     left sided second and sixth rib   . Compression  fracture     T9  . Tobacco abuse     remote    Past Surgical History  Procedure Date  . Tonsillectomy   . Appendectomy   . Orif hip fracture   . Leg surgery     R low  . Back surgery     No family history on file.  History   Social History  . Marital Status: Married    Spouse Name: N/A    Number of Children: N/A  . Years of Education: N/A   Occupational History  . retired Clinical research associate    Social History Main Topics  . Smoking status: Former Smoker -- 20 years  . Smokeless tobacco: Never Used  . Alcohol Use: Not on file     No history of alcohol abuse  . Drug Use: No  . Sexually Active: Not on file   Other Topics Concern  . Not on file   Social History Narrative  . No narrative on file    ROS: Please see the HPI.  All other systems reviewed  and negative.  PHYSICAL EXAM:  BP 100/60  Pulse 57  Ht 5\' 6"  (1.676 m)  Wt 155 lb (70.308 kg)  BMI 25.02 kg/m2  General: Well developed, well nourished, in no acute distress. Head:  Normocephalic and atraumatic. Neck: no JVD Lungs: Clear to auscultation and percussion. Heart: Normal S1 and S2.  No murmur, rubs or gallops.  Pulses: Pulses normal in all 4 extremities. Extremities: No clubbing or cyanosis. No edema. Neurologic: Alert and oriented x 3.  EKG:  SB.  Lower voltage QRS.  ASSESSMENT AND PLAN:

## 2010-07-22 NOTE — Assessment & Plan Note (Signed)
Followed by Dr. Laury Axon and at [facility].  Continue current medications, which appear ideal for her.  No problems with medications.

## 2010-07-22 NOTE — Patient Instructions (Signed)
Your physician recommends that you continue on your current medications as directed. Please refer to the Current Medication list given to you today.  You can stop taking Plavix on 08/16/10.   Your physician wants you to follow-up in: 6 MONTHS.  You will receive a reminder letter in the mail two months in advance. If you don't receive a letter, please call our office to schedule the follow-up appointment.

## 2010-07-22 NOTE — Assessment & Plan Note (Signed)
Patient had non DES on May 2011 which was widely patent in early January.  Has residual disease in ramus. Bruises a lot.  We will stop plavix at one year per guidelines, and with non DES should be stable.  Options reviewed and she is ok with dc.  Will see back in follow up in six months.

## 2010-07-22 NOTE — Assessment & Plan Note (Signed)
Does not sound like atrial fibrillation from its description.  Encouraged to call us if any sustained.

## 2010-07-23 ENCOUNTER — Telehealth: Payer: Self-pay | Admitting: Cardiology

## 2010-07-23 NOTE — Telephone Encounter (Signed)
I spoke with the pt and she needs to continue her Plavix through 08/16/10 as instructed by Dr Riley Kill.  The pt said she would be short by about 11-12 days.  The pt normally orders a 90 day supply from [facility].  I placed 12 tablets of Plavix at the front desk for the pt to pick-up.

## 2010-10-23 ENCOUNTER — Telehealth: Payer: Self-pay | Admitting: Cardiology

## 2010-10-23 NOTE — Telephone Encounter (Signed)
Faxed OV, EKG, Stress, Echo & D/S to Asher Muir at [facility] ([phone]).

## 2010-10-31 ENCOUNTER — Telehealth: Payer: Self-pay | Admitting: Cardiology

## 2010-10-31 MED ORDER — PANTOPRAZOLE SODIUM 40 MG PO TBEC: 40.0000 mg | DELAYED_RELEASE_TABLET | Freq: Every day | ORAL | Status: DC

## 2010-10-31 NOTE — Telephone Encounter (Signed)
Refilled Protonix 

## 2010-10-31 NOTE — Telephone Encounter (Signed)
Per pt call, interested in getting refills on a RX that does not have any refills. Pt runs out of RX in a couple of weeks. Please return pt call to advise.

## 2010-11-09 ENCOUNTER — Other Ambulatory Visit: Payer: Self-pay | Admitting: Cardiology

## 2010-11-19 ENCOUNTER — Ambulatory Visit: Payer: Medicare Other | Admitting: Family Medicine

## 2010-11-20 ENCOUNTER — Ambulatory Visit (INDEPENDENT_AMBULATORY_CARE_PROVIDER_SITE_OTHER): Payer: Medicare Other | Admitting: Family Medicine

## 2010-11-20 ENCOUNTER — Encounter: Payer: Self-pay | Admitting: Family Medicine

## 2010-11-20 VITALS — BP 140/82 | HR 56 | Temp 98.3°F | Wt 157.0 lb

## 2010-11-20 DIAGNOSIS — R0602 Shortness of breath: Secondary | ICD-10-CM

## 2010-11-20 DIAGNOSIS — E785 Hyperlipidemia, unspecified: Secondary | ICD-10-CM

## 2010-11-20 LAB — LIPID PANEL
Cholesterol: 176 mg/dL (ref 0–200)
HDL: 111.6 mg/dL (ref >39.00)
LDL Cholesterol: 54 mg/dL (ref 0–99)
Total CHOL/HDL Ratio: 2
Triglycerides: 50 mg/dL (ref 0.0–149.0)
VLDL: 10 mg/dL (ref 0.0–40.0)

## 2010-11-20 LAB — HEPATIC FUNCTION PANEL
ALT: 17 U/L (ref 0–35)
AST: 20 U/L (ref 0–37)
Albumin: 4.4 g/dL (ref 3.5–5.2)
Alkaline Phosphatase: 64 U/L (ref 39–117)
Bilirubin, Direct: 0 mg/dL (ref 0.0–0.3)
Total Bilirubin: 0.7 mg/dL (ref 0.3–1.2)
Total Protein: 7.6 g/dL (ref 6.0–8.3)

## 2010-11-20 LAB — BASIC METABOLIC PANEL
BUN: 9 mg/dL (ref 6–23)
CO2: 29 mEq/L (ref 19–32)
Calcium: 9.4 mg/dL (ref 8.4–10.5)
Chloride: 97 mEq/L (ref 96–112)
Creatinine, Ser: 0.6 mg/dL (ref 0.4–1.2)
GFR: 102.73 mL/min (ref >60.00)
Glucose, Bld: 104 mg/dL — ABNORMAL HIGH (ref 70–99)
Potassium: 4.8 mEq/L (ref 3.5–5.1)
Sodium: 136 mEq/L (ref 135–145)

## 2010-11-20 NOTE — Progress Notes (Signed)
  Subjective:    Patient ID: [name], female    DOB: [birth year], 70 y.o.   MRN: [id number]  HPI Pt here for 16m f/u cholesterol .  No complaints.   Review of Systems As above    Objective:   Physical Exam  Constitutional: She is oriented to person, place, and time. She appears well-developed and well-nourished.  Cardiovascular: Normal rate, regular rhythm and normal heart sounds.   No murmur heard. Pulmonary/Chest: Effort normal and breath sounds normal. No respiratory distress. She has no wheezes. She has no rales.  Musculoskeletal: She exhibits no edema.  Neurological: She is alert and oriented to person, place, and time.  Psychiatric: She has a normal mood and affect. Her behavior is normal.          Assessment & Plan:

## 2010-11-20 NOTE — Patient Instructions (Signed)

## 2010-11-20 NOTE — Assessment & Plan Note (Signed)
con't meds  Check labs 

## 2010-11-21 ENCOUNTER — Encounter: Payer: Self-pay | Admitting: Family Medicine

## 2010-12-31 LAB — POCT URINALYSIS DIP (DEVICE)
Bilirubin Urine: NEGATIVE
Glucose, UA: NEGATIVE
Ketones, ur: NEGATIVE
Nitrite: POSITIVE — AB
Operator id: 247071
Protein, ur: 100 — AB
Specific Gravity, Urine: 1.01
Urobilinogen, UA: 0.2
pH: 5.5

## 2011-01-09 LAB — POCT URINALYSIS DIP (DEVICE)
Bilirubin Urine: NEGATIVE
Glucose, UA: NEGATIVE
Hgb urine dipstick: NEGATIVE
Ketones, ur: 15 — AB
Nitrite: NEGATIVE
Operator id: 116391
Protein, ur: 30 — AB
Specific Gravity, Urine: 1.02
Urobilinogen, UA: 0.2
pH: 7

## 2011-01-19 ENCOUNTER — Encounter: Payer: Self-pay | Admitting: Cardiology

## 2011-01-19 ENCOUNTER — Ambulatory Visit (INDEPENDENT_AMBULATORY_CARE_PROVIDER_SITE_OTHER): Payer: Medicare Other | Admitting: Cardiology

## 2011-01-19 DIAGNOSIS — E785 Hyperlipidemia, unspecified: Secondary | ICD-10-CM

## 2011-01-19 DIAGNOSIS — I2510 Atherosclerotic heart disease of native coronary artery without angina pectoris: Secondary | ICD-10-CM

## 2011-01-19 NOTE — Assessment & Plan Note (Signed)
Patient is doing well.  No chest pain.  Continue current medications.  Return in one year.

## 2011-01-19 NOTE — Progress Notes (Signed)
HPI:  She is doing well. Denies any chest pain.  Feels good overall.  Walks a lot.    Current Outpatient Prescriptions  Medication Sig Dispense Refill  . aspirin 81 MG EC tablet Take 81 mg by mouth daily.        . Calcium Carbonate-Vitamin D 600-400 MG-UNIT per tablet Take 1 tablet by mouth daily.        . Fluticasone-Salmeterol (ADVAIR DISKUS) 250-50 MCG/DOSE AEPB Inhale 1 puff into the lungs 2 (two) times daily.  180 each  2  . LOTEMAX 0.5 % ophthalmic suspension       . metoprolol succinate (TOPROL-XL) 25 MG 24 hr tablet TAKE 1 TABLET DAILY  90 tablet  2  . Multiple Vitamins-Minerals (QC WOMENS DAILY MULTIVITAMIN) TABS Take by mouth daily. with iron       . nitroGLYCERIN (NITROSTAT) 0.4 MG SL tablet Place 0.4 mg under the tongue every 5 (five) minutes as needed.        . pantoprazole (PROTONIX) 40 MG tablet Take 1 tablet (40 mg total) by mouth daily.  90 tablet  3  . rosuvastatin (CRESTOR) 20 MG tablet Take 20 mg by mouth daily.        . Zoledronic Acid (RECLAST IV) Inject into the vein. Yearly          Allergies  Allergen Reactions  . Codeine   . Sulfonamide Derivatives     Past Medical History  Diagnosis Date  . Coronary artery disease 08-14-09    status post recent ventricular fibrillation arrest and anterior myocardial infaction with bare-metal stenting of the left anterior   descending cornonary artery  . Hx of heart artery stent     LAD, dCFX 20-30%; RI 70% (small vessel) ..med  Rx;;  normal LVF Echo 10/2009; EF55-60%; ank; Grade 1 diast dysfx; mild LAE  . GERD (gastroesophageal reflux disease)   . Osteoarthritis   . Osteopenia   . Fracture     left sided second and sixth rib   . Compression fracture     T9  . Tobacco abuse     remote    Past Surgical History  Procedure Date  . Tonsillectomy   . Appendectomy   . Orif hip fracture   . Leg surgery     R low  . Back surgery     No family history on file.  History   Social History  . Marital Status: Married      Spouse Name: N/A    Number of Children: N/A  . Years of Education: N/A   Occupational History  . retired Clinical research associate    Social History Main Topics  . Smoking status: Former Smoker -- 20 years  . Smokeless tobacco: Never Used  . Alcohol Use: Not on file     No history of alcohol abuse  . Drug Use: No  . Sexually Active: Not on file   Other Topics Concern  . Not on file   Social History Narrative  . No narrative on file    ROS: Please see the HPI.  All other systems reviewed and negative.  PHYSICAL EXAM:  BP 146/80  Pulse 60  Resp 18  Ht 5\' 6"  (1.676 m)  Wt 157 lb 6.4 oz (71.396 kg)  BMI 25.40 kg/m2  General: Well developed, well nourished, in no acute distress. Head:  Normocephalic and atraumatic. Neck: no JVD Lungs: Clear to auscultation and percussion. Heart: Normal S1 and S2.  No murmur, rubs or gallops.  Abdomen:  Normal bowel sounds; soft; non tender; no organomegaly Pulses: Pulses normal in all 4 extremities. Extremities: No clubbing or cyanosis. No edema. Neurologic: Alert and oriented x 3.  EKG:  NSR.  Low voltage QRS.   ASSESSMENT AND PLAN:

## 2011-01-19 NOTE — Assessment & Plan Note (Signed)
Recently at [facility].  Is scheduled for follow up with Dr. Laury Axon.

## 2011-01-19 NOTE — Patient Instructions (Signed)
Your physician wants you to follow-up in: 1 YEAR.  You will receive a reminder letter in the mail two months in advance. If you don't receive a letter, please call our office to schedule the follow-up appointment.  Your physician recommends that you continue on your current medications as directed. Please refer to the Current Medication list given to you today.  

## 2011-02-09 ENCOUNTER — Other Ambulatory Visit: Payer: Self-pay | Admitting: Cardiology

## 2011-02-17 ENCOUNTER — Telehealth: Payer: Self-pay

## 2011-02-17 NOTE — Telephone Encounter (Signed)
Pt called to ask if Dr. Laury Axon thought she should take the Forteo inj recommended by Dr. Corliss Skains.  Advised pt that if it was recommended by the specialist, then she should go with what is recommended. Pt notes that Dr, Corliss Skains knows of all her medications and she will talk more with Dr. Corliss Skains if she has questions or concerns

## 2011-04-09 ENCOUNTER — Telehealth: Payer: Self-pay | Admitting: Cardiology

## 2011-04-09 NOTE — Telephone Encounter (Signed)
New Problem:     Patient called in today because, she was prescribed by Dr. Audley Hose some medication for osteo perosis (voltaren topical gel) and she was wondering if it would be ok to take because she had a stent placed and a heart attack. She believes that it is safe to use but just wanted to make sure.

## 2011-04-10 NOTE — Telephone Encounter (Signed)
I spoke with the pt and she was given 1 Gram Voltaren gel to use twice a day on her hands for osteoporosis.  The pt would like to know if this is okay to use.  I will ask Dr Riley Kill next week when he is in the office and call the pt back.

## 2011-04-14 ENCOUNTER — Telehealth: Payer: Self-pay

## 2011-04-14 NOTE — Telephone Encounter (Signed)
Really nothing otc for nausea and dry heaves.  She can try mucinex or delsym for cough otherwise she would need ov.

## 2011-04-14 NOTE — Telephone Encounter (Signed)
Pt states she has sore throat, nausea, achy body, no fever, cough and pt states the symptoms had gone away but came right back. Pt states this morning she had dry heaves and was very achy.  Pt has not been taking an OTC medications. Pls advise.  Pt states she may call back for appt tomorrow.

## 2011-04-15 NOTE — Telephone Encounter (Signed)
Per Dr Riley Kill this should be okay for the pt to use.  I made the pt aware that Voltaren has very little absorption into the body and that we do not have a lot of information at this time about the effects of this medication and cardiac risk.  Because of low absorption Dr Riley Kill felt the pt could use Voltaren gel.

## 2011-04-15 NOTE — Telephone Encounter (Signed)
Per Dr Riley Kill please speak with Kennon Rounds Pharm-D about the systemic effects of Voltaren.  Per Kennon Rounds Voltaren has very little systemic absorption. I will make Dr Riley Kill aware of this information.

## 2011-04-15 NOTE — Telephone Encounter (Signed)
Discussed with patient and she stated she went to the [facility] and saw a NP and she is feeling better.      KP

## 2011-07-15 ENCOUNTER — Other Ambulatory Visit: Payer: Self-pay | Admitting: Cardiology

## 2011-07-24 ENCOUNTER — Encounter (HOSPITAL_COMMUNITY): Payer: Self-pay | Admitting: Adult Health

## 2011-07-24 ENCOUNTER — Emergency Department (HOSPITAL_COMMUNITY): Payer: Medicare Other

## 2011-07-24 ENCOUNTER — Emergency Department (HOSPITAL_COMMUNITY)
Admission: EM | Admit: 2011-07-24 | Discharge: 2011-07-24 | Disposition: A | Discharge status: Home / Self Care | Payer: Medicare Other | Attending: Emergency Medicine | Admitting: Emergency Medicine

## 2011-07-24 DIAGNOSIS — R112 Nausea with vomiting, unspecified: Secondary | ICD-10-CM

## 2011-07-24 DIAGNOSIS — R197 Diarrhea, unspecified: Secondary | ICD-10-CM | POA: Insufficient documentation

## 2011-07-24 DIAGNOSIS — K219 Gastro-esophageal reflux disease without esophagitis: Secondary | ICD-10-CM | POA: Insufficient documentation

## 2011-07-24 LAB — URINALYSIS, ROUTINE W REFLEX MICROSCOPIC
Bilirubin Urine: NEGATIVE
Glucose, UA: NEGATIVE mg/dL
Hgb urine dipstick: NEGATIVE
Ketones, ur: NEGATIVE mg/dL
Leukocytes, UA: NEGATIVE
Nitrite: NEGATIVE
Protein, ur: NEGATIVE mg/dL
Specific Gravity, Urine: 1.01 (ref 1.005–1.030)
Urobilinogen, UA: 0.2 mg/dL (ref 0.0–1.0)
pH: 6.5 (ref 5.0–8.0)

## 2011-07-24 LAB — CBC
HCT: 39.3 % (ref 36.0–46.0)
Hemoglobin: 14 g/dL (ref 12.0–15.0)
MCH: 34.4 pg — ABNORMAL HIGH (ref 26.0–34.0)
MCHC: 35.6 g/dL (ref 30.0–36.0)
MCV: 96.6 fL (ref 78.0–100.0)
Platelets: 209 10*3/uL (ref 150–400)
RBC: 4.07 MIL/uL (ref 3.87–5.11)
RDW: 12.2 % (ref 11.5–15.5)
WBC: 2.5 10*3/uL — ABNORMAL LOW (ref 4.0–10.5)

## 2011-07-24 LAB — DIFFERENTIAL
Basophils Absolute: 0 10*3/uL (ref 0.0–0.1)
Basophils Relative: 0 % (ref 0–1)
Eosinophils Absolute: 0 10*3/uL (ref 0.0–0.7)
Eosinophils Relative: 0 % (ref 0–5)
Lymphocytes Relative: 19 % (ref 12–46)
Lymphs Abs: 0.5 10*3/uL — ABNORMAL LOW (ref 0.7–4.0)
Monocytes Absolute: 0.6 10*3/uL (ref 0.1–1.0)
Monocytes Relative: 23 % — ABNORMAL HIGH (ref 3–12)
Neutro Abs: 1.5 10*3/uL — ABNORMAL LOW (ref 1.7–7.7)
Neutrophils Relative %: 58 % (ref 43–77)

## 2011-07-24 LAB — COMPREHENSIVE METABOLIC PANEL
ALT: 24 U/L (ref 0–35)
AST: 32 U/L (ref 0–37)
Albumin: 3.6 g/dL (ref 3.5–5.2)
Alkaline Phosphatase: 67 U/L (ref 39–117)
BUN: 9 mg/dL (ref 6–23)
CO2: 25 mEq/L (ref 19–32)
Calcium: 8.3 mg/dL — ABNORMAL LOW (ref 8.4–10.5)
Chloride: 92 mEq/L — ABNORMAL LOW (ref 96–112)
Creatinine, Ser: 0.53 mg/dL (ref 0.50–1.10)
GFR calc Af Amer: >90 mL/min (ref >90)
GFR calc non Af Amer: >90 mL/min (ref >90)
Glucose, Bld: 113 mg/dL — ABNORMAL HIGH (ref 70–99)
Potassium: 3.8 mEq/L (ref 3.5–5.1)
Sodium: 128 mEq/L — ABNORMAL LOW (ref 135–145)
Total Bilirubin: 0.3 mg/dL (ref 0.3–1.2)
Total Protein: 6.9 g/dL (ref 6.0–8.3)

## 2011-07-24 LAB — LIPASE, BLOOD: Lipase: 39 U/L (ref 11–59)

## 2011-07-24 MED ORDER — ONDANSETRON 8 MG PO TBDP: 8.0000 mg | ORAL_TABLET | Freq: Three times a day (TID) | ORAL | Status: AC | PRN

## 2011-07-24 MED ORDER — SODIUM CHLORIDE 0.9 % IV SOLN
1000.0000 mL | INTRAVENOUS | Status: DC
  Administered 2011-07-24: 1000 mL via INTRAVENOUS

## 2011-07-24 MED ORDER — MORPHINE SULFATE 4 MG/ML IJ SOLN
2.0000 mg | Freq: Once | INTRAMUSCULAR | Status: AC
  Administered 2011-07-24: 2 mg via INTRAVENOUS
  Filled 2011-07-24: qty 1

## 2011-07-24 MED ORDER — SODIUM CHLORIDE 0.9 % IV SOLN
1000.0000 mL | Freq: Once | INTRAVENOUS | Status: AC
  Administered 2011-07-24: 1000 mL via INTRAVENOUS

## 2011-07-24 MED ORDER — ONDANSETRON HCL 4 MG/2ML IJ SOLN
4.0000 mg | Freq: Once | INTRAMUSCULAR | Status: AC
  Administered 2011-07-24: 4 mg via INTRAVENOUS
  Filled 2011-07-24: qty 2

## 2011-07-24 NOTE — ED Provider Notes (Signed)
History     CSN: [id number]  Arrival date & time 07/24/11  2130   First MD Initiated Contact with Patient 07/24/11 [phone]      Chief Complaint  Patient presents with  . Nausea  . Diarrhea     HPI Comments: Pt thought she was getting better yesterday but then last night she was up every 30 minutes with diarrhea.  She also developed bad stomach cramping.  The abdominal cramping is all over.  Patient is a 71 y.o. female presenting with diarrhea. The history is provided by the patient.  Diarrhea The primary symptoms include abdominal pain, nausea, vomiting and diarrhea. Primary symptoms do not include fever or dysuria. The illness began 3 to 5 days ago. The onset was gradual.  Vomiting occurs 2 to 5 times per day (last episode was once last night).  The diarrhea is watery (without blood).  The illness does not include chills or bloating.    Past Medical History  Diagnosis Date  . Coronary artery disease 08-14-09    status post recent ventricular fibrillation arrest and anterior myocardial infaction with bare-metal stenting of the left anterior   descending cornonary artery  . Hx of heart artery stent     LAD, dCFX 20-30%; RI 70% (small vessel) ..med  Rx;;  normal LVF Echo 10/2009; EF55-60%; ank; Grade 1 diast dysfx; mild LAE  . GERD (gastroesophageal reflux disease)   . Osteoarthritis   . Osteopenia   . Fracture     left sided second and sixth rib   . Compression fracture     T9    Past Surgical History  Procedure Date  . Tonsillectomy   . Appendectomy   . Orif hip fracture   . Leg surgery     R low  . Back surgery     History reviewed. No pertinent family history.  History  Substance Use Topics  . Smoking status: Former Smoker -- 20 years  . Smokeless tobacco: Never Used  . Alcohol Use: Yes     No history of alcohol abuse    OB History    Grav Para Term Preterm Abortions TAB SAB Ect Mult Living                  Review of Systems  Constitutional: Negative  for fever and chills.  Respiratory: Negative for cough and shortness of breath.   Cardiovascular: Negative for chest pain.  Gastrointestinal: Positive for nausea, vomiting, abdominal pain and diarrhea. Negative for bloating.  Genitourinary: Negative for dysuria and urgency.  All other systems reviewed and are negative.    Allergies  Codeine and Sulfonamide derivatives  Home Medications   Current Outpatient Rx  Name Route Sig Dispense Refill  . FORTEO [place] Subcutaneous Inject into the skin.    . ASPIRIN 81 MG PO TBEC Oral Take 81 mg by mouth daily.      Marland Kitchen CALCIUM CARBONATE-VITAMIN D 600-400 MG-UNIT PO TABS Oral Take 1 tablet by mouth daily.      . CRESTOR 20 MG PO TABS  TAKE 1 TABLET DAILY 90 tablet 2  . FLUTICASONE-SALMETEROL 250-50 MCG/DOSE IN AEPB Inhalation Inhale 1 puff into the lungs 2 (two) times daily. 180 each 2  . METOPROLOL SUCCINATE ER 25 MG PO TB24  TAKE 1 TABLET DAILY 90 tablet 1  . QC WOMENS DAILY MULTIVITAMIN PO TABS Oral Take by mouth daily. with iron     . NITROGLYCERIN 0.4 MG SL SUBL Sublingual Place 0.4 mg under  the tongue every 5 (five) minutes as needed.      Marland Kitchen PANTOPRAZOLE SODIUM 40 MG PO TBEC Oral Take 1 tablet (40 mg total) by mouth daily. 90 tablet 3  . RECLAST IV Intravenous Inject into the vein. Yearly       BP 103/67  Pulse 59  Temp(Src) 97.7 F (36.5 C) (Oral)  Resp 16  SpO2 96%  Physical Exam  Nursing note and vitals reviewed. Constitutional: She appears well-developed and well-nourished. No distress.  HENT:  Head: Normocephalic and atraumatic.  Right Ear: External ear normal.  Left Ear: External ear normal.  Mouth/Throat: No oropharyngeal exudate.  Eyes: Conjunctivae are normal. Right eye exhibits no discharge. Left eye exhibits no discharge. No scleral icterus.  Neck: Neck supple. No tracheal deviation present.  Cardiovascular: Normal rate, regular rhythm and intact distal pulses.   Pulmonary/Chest: Effort normal and breath sounds normal.  No stridor. No respiratory distress. She has no wheezes. She has no rales.  Abdominal: Soft. Bowel sounds are normal. She exhibits no distension. There is no tenderness. There is no rebound and no guarding.  Musculoskeletal: She exhibits no edema and no tenderness.  Neurological: She is alert. She has normal strength. No sensory deficit. Cranial nerve deficit:  no gross defecits noted. She exhibits normal muscle tone. She displays no seizure activity. Coordination normal.  Skin: Skin is warm and dry. No rash noted.  Psychiatric: She has a normal mood and affect.    ED Course  Procedures (including critical care time)  Labs Reviewed  CBC - Abnormal; Notable for the following:    WBC 2.5 (*)    MCH 34.4 (*)    All other components within normal limits  DIFFERENTIAL - Abnormal; Notable for the following:    Neutro Abs 1.5 (*)    Lymphs Abs 0.5 (*)    Monocytes Relative 23 (*)    All other components within normal limits  COMPREHENSIVE METABOLIC PANEL - Abnormal; Notable for the following:    Sodium 128 (*)    Chloride 92 (*)    Glucose, Bld 113 (*)    Calcium 8.3 (*)    All other components within normal limits  URINALYSIS, ROUTINE W REFLEX MICROSCOPIC - Abnormal; Notable for the following:    APPearance CLOUDY (*)    All other components within normal limits  LIPASE, BLOOD   Dg Abd Acute W/chest  07/24/2011  *RADIOLOGY REPORT*  Clinical Data: 71 year old female with vomiting diarrhea lower abdominal pain.  ACUTE ABDOMEN SERIES (ABDOMEN 2 VIEW & CHEST 1 VIEW)  Comparison: 04/13/2010 and earlier.  Findings: Mild elevation of the right hemidiaphragm.  Cardiac size and mediastinal contours are stable; tortuous descending thoracic aorta.  No pneumothorax, pulmonary edema, pleural effusion or confluent pulmonary opacity.  No pneumoperitoneum.  Upper lumbar kyphoplasty or vertebroplasty sequelae.  Mid lumbar interbody fusion and lower lumbar inner spinous spacer device.  Postoperative changes  of proximal right femur. Nonobstructed bowel gas pattern.  T9 level compression fracture is stable.  IMPRESSION: 1. Nonobstructed bowel gas pattern, no free air. 2. No acute cardiopulmonary abnormality.  Original Report Authenticated By: Harley Hallmark, M.D.    MDM  Pt has hyponatremia and a decreased wbc count associated with this GI illness.  Pt has been treated with iv fluids and antiemetics.  She is feeling much better at this time.  No abdominal ttp. She has been given food and drink here in the ED and has been able to tolerate that.  Suspect that  this could be a viral GE illness.  Doubt colitis, diverticulitis.  Hyponatremia likely related to this GI illness.  She has been given normal saline and should correct not that she is taking PO.  Will dc home on oral antiemetics.  Pt instructed to return for worsening symptoms, fever, other concerns.        Celene Kras, MD 07/24/11 [phone]

## 2011-07-24 NOTE — ED Notes (Signed)
Pt tolerated gram crackers and ginger ale.

## 2011-07-24 NOTE — ED Notes (Signed)
Family at bedside.    Husband

## 2011-07-24 NOTE — Discharge Instructions (Signed)
Diet for Diarrhea, Adult Having frequent, runny stools (diarrhea) has many causes. Diarrhea may be caused or worsened by food or drink. Diarrhea may be relieved by changing your diet. IF YOU ARE NOT TOLERATING SOLID FOODS:  Drink enough water and fluids to keep your urine clear or pale yellow.   Avoid sugary drinks and sodas as well as milk-based beverages.   Avoid beverages containing caffeine and alcohol.   You may try rehydrating beverages. You can make your own by following this recipe:    tsp table salt.    tsp baking soda.   ? tsp salt substitute (potassium chloride).   1 tbs + 1 tsp sugar.   1 qt water.  As your stools become more solid, you can start eating solid foods. Add foods one at a time. If a certain food causes your diarrhea to get worse, avoid that food and try other foods. A low fiber, low-fat, and lactose-free diet is recommended. Small, frequent meals may be better tolerated.  Starches  Allowed:  White, French, and pita breads, plain rolls, buns, bagels. Plain muffins, matzo. Soda, saltine, or graham crackers. Pretzels, melba toast, zwieback. Cooked cereals made with water: cornmeal, farina, cream cereals. Dry cereals: refined corn, wheat, rice. Potatoes prepared any way without skins, refined macaroni, spaghetti, noodles, refined rice.   Avoid:  Bread, rolls, or crackers made with whole wheat, multi-grains, rye, bran seeds, nuts, or coconut. Corn tortillas or taco shells. Cereals containing whole grains, multi-grains, bran, coconut, nuts, or raisins. Cooked or dry oatmeal. Coarse wheat cereals, granola. Cereals advertised as "high-fiber." Potato skins. Whole grain pasta, wild or brown rice. Popcorn. Sweet potatoes/yams. Sweet rolls, doughnuts, waffles, pancakes, sweet breads.  Vegetables  Allowed: Strained tomato and vegetable juices. Most well-cooked and canned vegetables without seeds. Fresh: Tender lettuce, cucumber without the skin, cabbage, spinach, bean  sprouts.   Avoid: Fresh, cooked, or canned: Artichokes, baked beans, beet greens, broccoli, Brussels sprouts, corn, kale, legumes, peas, sweet potatoes. Cooked: Green or red cabbage, spinach. Avoid large servings of any vegetables, because vegetables shrink when cooked, and they contain more fiber per serving than fresh vegetables.  Fruit  Allowed: All fruit juices except prune juice. Cooked or canned: Apricots, applesauce, cantaloupe, cherries, fruit cocktail, grapefruit, grapes, kiwi, mandarin oranges, peaches, pears, plums, watermelon. Fresh: Apples without skin, ripe banana, grapes, cantaloupe, cherries, grapefruit, peaches, oranges, plums. Keep servings limited to  cup or 1 piece.   Avoid: Fresh: Apple with skin, apricots, mango, pears, raspberries, strawberries. Prune juice, stewed or dried prunes. Dried fruits, raisins, dates. Large servings of all fresh fruits.  Meat and Meat Substitutes  Allowed: Ground or well-cooked tender beef, ham, veal, lamb, pork, or poultry. Eggs, plain cheese. Fish, oysters, shrimp, lobster, other seafoods. Liver, organ meats.   Avoid: Tough, fibrous meats with gristle. Peanut butter, smooth or chunky. Cheese, nuts, seeds, legumes, dried peas, beans, lentils.  Milk  Allowed: Yogurt, lactose-free milk, kefir, drinkable yogurt, buttermilk, soy milk.   Avoid: Milk, chocolate milk, beverages made with milk, such as milk shakes.  Soups  Allowed: Bouillon, broth, or soups made from allowed foods. Any strained soup.   Avoid: Soups made from vegetables that are not allowed, cream or milk-based soups.  Desserts and Sweets  Allowed: Sugar-free gelatin, sugar-free frozen ice pops made without sugar alcohol.   Avoid: Plain cakes and cookies, pie made with allowed fruit, pudding, custard, cream pie. Gelatin, fruit, ice, sherbet, frozen ice pops. Ice cream, ice milk without nuts. Plain hard candy,   honey, jelly, molasses, syrup, sugar, chocolate syrup, gumdrops,  marshmallows.  Fats and Oils  Allowed: Avoid any fats and oils.   Avoid: Seeds, nuts, olives, avocados. Margarine, butter, cream, mayonnaise, salad oils, plain salad dressings made from allowed foods. Plain gravy, crisp bacon without rind.  Beverages  Allowed: Water, decaffeinated teas, oral rehydration solutions, sugar-free beverages.   Avoid: Fruit juices, caffeinated beverages (coffee, tea, soda or pop), alcohol, sports drinks, or lemon-lime soda or pop.  Condiments  Allowed: Ketchup, mustard, horseradish, vinegar, cream sauce, cheese sauce, cocoa powder. Spices in moderation: allspice, basil, bay leaves, celery powder or leaves, cinnamon, cumin powder, curry powder, ginger, mace, marjoram, onion or garlic powder, oregano, paprika, parsley flakes, ground pepper, rosemary, sage, savory, tarragon, thyme, turmeric.   Avoid: Coconut, honey.  Weight Monitoring: Weigh yourself every day. You should weigh yourself in the morning after you urinate and before you eat breakfast. Wear the same amount of clothing when you weigh yourself. Record your weight daily. Bring your recorded weights to your clinic visits. Tell your caregiver right away if you have gained 3 lb/1.4 kg or more in 1 day, 5 lb/2.3 kg in a week, or whatever amount you were told to report. SEEK IMMEDIATE MEDICAL CARE IF:   You are unable to keep fluids down.   You start to throw up (vomit) or diarrhea keeps coming back (persistent).   Abdominal pain develops, increases, or can be felt in one place (localizes).   You have an oral temperature above 102 F (38.9 C), not controlled by medicine.   Diarrhea contains blood or mucus.   You develop excessive weakness, dizziness, fainting, or extreme thirst.  MAKE SURE YOU:   Understand these instructions.   Will watch your condition.   Will get help right away if you are not doing well or get worse.  Document Released: 06/13/2003 Document Revised: 03/12/2011 Document Reviewed:  10/04/2008 Exit[employer] Patient Information 2012 [employer].Nausea and Vomiting Nausea is a sick feeling that often comes before throwing up (vomiting). Vomiting is a reflex where stomach contents come out of your mouth. Vomiting can cause severe loss of body fluids (dehydration). Children and elderly adults can become dehydrated quickly, especially if they also have diarrhea. Nausea and vomiting are symptoms of a condition or disease. It is important to find the cause of your symptoms. CAUSES   Direct irritation of the stomach lining. This irritation can result from increased acid production (gastroesophageal reflux disease), infection, food poisoning, taking certain medicines (such as nonsteroidal anti-inflammatory drugs), alcohol use, or tobacco use.   Signals from the brain.These signals could be caused by a headache, heat exposure, an inner ear disturbance, increased pressure in the brain from injury, infection, a tumor, or a concussion, pain, emotional stimulus, or metabolic problems.   An obstruction in the gastrointestinal tract (bowel obstruction).   Illnesses such as diabetes, hepatitis, gallbladder problems, appendicitis, kidney problems, cancer, sepsis, atypical symptoms of a heart attack, or eating disorders.   Medical treatments such as chemotherapy and radiation.   Receiving medicine that makes you sleep (general anesthetic) during surgery.  DIAGNOSIS Your caregiver may ask for tests to be done if the problems do not improve after a few days. Tests may also be done if symptoms are severe or if the reason for the nausea and vomiting is not clear. Tests may include:  Urine tests.   Blood tests.   Stool tests.   Cultures (to look for evidence of infection).   X-rays or other imaging   studies.  Test results can help your caregiver make decisions about treatment or the need for additional tests. TREATMENT You need to stay well hydrated. Drink frequently but in small  amounts.You may wish to drink water, sports drinks, clear broth, or eat frozen ice pops or gelatin dessert to help stay hydrated.When you eat, eating slowly may help prevent nausea.There are also some antinausea medicines that may help prevent nausea. HOME CARE INSTRUCTIONS   Take all medicine as directed by your caregiver.   If you do not have an appetite, do not force yourself to eat. However, you must continue to drink fluids.   If you have an appetite, eat a normal diet unless your caregiver tells you differently.   Eat a variety of complex carbohydrates (rice, wheat, potatoes, bread), lean meats, yogurt, fruits, and vegetables.   Avoid high-fat foods because they are more difficult to digest.   Drink enough water and fluids to keep your urine clear or pale yellow.   If you are dehydrated, ask your caregiver for specific rehydration instructions. Signs of dehydration may include:   Severe thirst.   Dry lips and mouth.   Dizziness.   Dark urine.   Decreasing urine frequency and amount.   Confusion.   Rapid breathing or pulse.  SEEK IMMEDIATE MEDICAL CARE IF:   You have blood or brown flecks (like coffee grounds) in your vomit.   You have black or bloody stools.   You have a severe headache or stiff neck.   You are confused.   You have severe abdominal pain.   You have chest pain or trouble breathing.   You do not urinate at least once every 8 hours.   You develop cold or clammy skin.   You continue to vomit for longer than 24 to 48 hours.   You have a fever.  MAKE SURE YOU:   Understand these instructions.   Will watch your condition.   Will get help right away if you are not doing well or get worse.  Document Released: 03/23/2005 Document Revised: 03/12/2011 Document Reviewed: 08/20/2010 [employer] Patient Information 2012 [employer]. 

## 2011-07-24 NOTE — ED Notes (Signed)
C/o n/v/d since Tuesday and abdominal cramping. Pt staes her stool were intially dark and loose, but have become lighter. vomitting has subsided, diarrhea continues. Moist mucous membranes, alert and oriented.

## 2011-10-07 ENCOUNTER — Other Ambulatory Visit: Payer: Self-pay | Admitting: Cardiology

## 2011-11-24 ENCOUNTER — Other Ambulatory Visit: Payer: Self-pay | Admitting: Cardiology

## 2011-12-17 ENCOUNTER — Other Ambulatory Visit: Payer: Self-pay | Admitting: Obstetrics and Gynecology

## 2011-12-17 DIAGNOSIS — R928 Other abnormal and inconclusive findings on diagnostic imaging of breast: Secondary | ICD-10-CM

## 2011-12-18 ENCOUNTER — Ambulatory Visit
Admission: RE | Admit: 2011-12-18 | Discharge: 2011-12-18 | Disposition: A | Discharge status: Home / Self Care | Payer: Medicare Other | Source: Ambulatory Visit | Attending: Obstetrics and Gynecology | Admitting: Obstetrics and Gynecology

## 2011-12-18 DIAGNOSIS — R928 Other abnormal and inconclusive findings on diagnostic imaging of breast: Secondary | ICD-10-CM

## 2012-01-10 ENCOUNTER — Other Ambulatory Visit: Payer: Self-pay | Admitting: Cardiology

## 2012-01-19 ENCOUNTER — Encounter: Payer: Self-pay | Admitting: Cardiology

## 2012-01-19 ENCOUNTER — Ambulatory Visit (INDEPENDENT_AMBULATORY_CARE_PROVIDER_SITE_OTHER): Payer: Medicare Other | Admitting: Cardiology

## 2012-01-19 VITALS — BP 122/70 | HR 69 | Ht 65.0 in | Wt 162.0 lb

## 2012-01-19 DIAGNOSIS — E785 Hyperlipidemia, unspecified: Secondary | ICD-10-CM

## 2012-01-19 DIAGNOSIS — E871 Hypo-osmolality and hyponatremia: Secondary | ICD-10-CM

## 2012-01-19 DIAGNOSIS — I2510 Atherosclerotic heart disease of native coronary artery without angina pectoris: Secondary | ICD-10-CM

## 2012-01-19 MED ORDER — NITROGLYCERIN 0.4 MG SL SUBL: 0.4000 mg | SUBLINGUAL_TABLET | SUBLINGUAL | Status: DC | PRN

## 2012-01-19 NOTE — Progress Notes (Signed)
HPI:  Is in for followup. [name] is doing really quite well. [name] will been a problem with her knees [name] was walking on uneven pavement dear up. [name] saw Dr.Alusio.   [name] has changed her care to Dr. Elmore Guise.  [name] was also noted to have a low serum sodium recently.  Her lipids were last checked a year ago.    Current Outpatient Prescriptions  Medication Sig Dispense Refill  . aspirin 81 MG EC tablet Take 81 mg by mouth daily.        . Calcium Carbonate-Vitamin D 600-400 MG-UNIT per tablet Take 1 tablet by mouth daily.        . CRESTOR 20 MG tablet TAKE 1 TABLET DAILY  90 tablet  2  . diclofenac sodium (VOLTAREN) 1 % GEL Apply topically as needed.      . Fluticasone-Salmeterol (ADVAIR DISKUS) 250-50 MCG/DOSE AEPB Inhale 1 puff into the lungs 2 (two) times daily.  180 each  2  . metoprolol succinate (TOPROL-XL) 25 MG 24 hr tablet TAKE 1 TABLET DAILY  90 tablet  0  . Multiple Vitamins-Minerals (QC WOMENS DAILY MULTIVITAMIN) TABS Take by mouth daily. with iron       . nitroGLYCERIN (NITROSTAT) 0.4 MG SL tablet Place 0.4 mg under the tongue every 5 (five) minutes as needed.       . pantoprazole (PROTONIX) 40 MG tablet TAKE 1 TABLET DAILY  90 tablet  2  . Teriparatide, Recombinant, (FORTEO [place]) Inject into the skin daily.         Allergies  Allergen Reactions  . Codeine Nausea And Vomiting  . Sulfonamide Derivatives Nausea And Vomiting    Past Medical History  Diagnosis Date  . Coronary artery disease 08-14-09    status post recent ventricular fibrillation arrest and anterior myocardial infaction with bare-metal stenting of the left anterior   descending cornonary artery  . Hx of heart artery stent     LAD, dCFX 20-30%; RI 70% (small vessel) ..med  Rx;;  normal LVF Echo 10/2009; EF55-60%; ank; Grade 1 diast dysfx; mild LAE  . GERD (gastroesophageal reflux disease)   . Osteoarthritis   . Osteopenia   . Fracture     left sided second and sixth rib   . Compression fracture     T9    Past  Surgical History  Procedure Date  . Tonsillectomy   . Appendectomy   . Orif hip fracture   . Leg surgery     R low  . Back surgery     No family history on file.  History   Social History  . Marital Status: Married    Spouse Name: N/A    Number of Children: N/A  . Years of Education: N/A   Occupational History  . retired Clinical research associate    Social History Main Topics  . Smoking status: Former Smoker -- 20 years  . Smokeless tobacco: Never Used  . Alcohol Use: Yes     No history of alcohol abuse  . Drug Use: No  . Sexually Active: Not on file   Other Topics Concern  . Not on file   Social History Narrative  . No narrative on file    ROS: Please see the HPI.  All other systems reviewed and negative.  PHYSICAL EXAM:  BP 122/70  Pulse 69  Ht 5\' 5"  (1.651 m)  Wt 162 lb (73.483 kg)  BMI 26.96 kg/m2  SpO2 99%  General: Well developed, well nourished,  in no acute distress. Head:  Normocephalic and atraumatic. Neck: no JVD Lungs: Clear to auscultation and percussion. Heart: Normal S1 and S2.  No murmur, rubs or gallops.  Pulses: Pulses normal in all 4 extremities. Extremities: No clubbing or cyanosis. No edema. Neurologic: Alert and oriented x 3.  EKG:NSR. Low voltage QRS.  No acute changes.    ASSESSMENT AND PLAN:

## 2012-01-19 NOTE — Patient Instructions (Addendum)
Your physician wants you to follow-up in: 1 YEAR with Dr Cooper.  You will receive a reminder letter in the mail two months in advance. If you don't receive a letter, please call our office to schedule the follow-up appointment.  Your physician recommends that you continue on your current medications as directed. Please refer to the Current Medication list given to you today.  

## 2012-01-24 NOTE — Assessment & Plan Note (Signed)
She will need to have a lipid liver check with Dr. Chilton Si. I will forward a note to him.

## 2012-01-24 NOTE — Assessment & Plan Note (Signed)
The patient continues to do well from a cardiac standpoint, with no specific symptoms at the present time despite fairly good exercise

## 2012-01-24 NOTE — Assessment & Plan Note (Signed)
This problem is being followed in Dr. Thomasene Lot office.

## 2012-03-04 ENCOUNTER — Observation Stay (HOSPITAL_COMMUNITY)
Admission: EM | Admit: 2012-03-04 | Discharge: 2012-03-06 | Disposition: A | Discharge status: Home / Self Care | Payer: Medicare Other | Attending: Cardiology | Admitting: Cardiology

## 2012-03-04 ENCOUNTER — Emergency Department (INDEPENDENT_AMBULATORY_CARE_PROVIDER_SITE_OTHER)
Admission: EM | Admit: 2012-03-04 | Discharge: 2012-03-04 | Disposition: A | Discharge status: Nursing Home (Includes ICF) | Payer: Medicare Other | Source: Home / Self Care

## 2012-03-04 ENCOUNTER — Encounter (HOSPITAL_COMMUNITY): Payer: Self-pay | Admitting: Emergency Medicine

## 2012-03-04 ENCOUNTER — Emergency Department (HOSPITAL_COMMUNITY): Payer: Medicare Other

## 2012-03-04 DIAGNOSIS — R0602 Shortness of breath: Secondary | ICD-10-CM

## 2012-03-04 DIAGNOSIS — R072 Precordial pain: Secondary | ICD-10-CM

## 2012-03-04 DIAGNOSIS — E785 Hyperlipidemia, unspecified: Secondary | ICD-10-CM | POA: Diagnosis present

## 2012-03-04 DIAGNOSIS — R110 Nausea: Secondary | ICD-10-CM

## 2012-03-04 DIAGNOSIS — R0789 Other chest pain: Secondary | ICD-10-CM

## 2012-03-04 DIAGNOSIS — R002 Palpitations: Secondary | ICD-10-CM | POA: Diagnosis present

## 2012-03-04 DIAGNOSIS — R079 Chest pain, unspecified: Principal | ICD-10-CM

## 2012-03-04 DIAGNOSIS — Z79899 Other long term (current) drug therapy: Secondary | ICD-10-CM | POA: Insufficient documentation

## 2012-03-04 DIAGNOSIS — K219 Gastro-esophageal reflux disease without esophagitis: Secondary | ICD-10-CM

## 2012-03-04 DIAGNOSIS — I2510 Atherosclerotic heart disease of native coronary artery without angina pectoris: Secondary | ICD-10-CM | POA: Diagnosis present

## 2012-03-04 LAB — CBC WITH DIFFERENTIAL/PLATELET
Basophils Absolute: 0 10*3/uL (ref 0.0–0.1)
Basophils Relative: 0 % (ref 0–1)
Eosinophils Absolute: 0 10*3/uL (ref 0.0–0.7)
Eosinophils Relative: 0 % (ref 0–5)
HCT: 37.9 % (ref 36.0–46.0)
Hemoglobin: 12.7 g/dL (ref 12.0–15.0)
Lymphocytes Relative: 17 % (ref 12–46)
Lymphs Abs: 1.2 10*3/uL (ref 0.7–4.0)
MCH: 32.9 pg (ref 26.0–34.0)
MCHC: 33.5 g/dL (ref 30.0–36.0)
MCV: 98.2 fL (ref 78.0–100.0)
Monocytes Absolute: 0.6 10*3/uL (ref 0.1–1.0)
Monocytes Relative: 8 % (ref 3–12)
Neutro Abs: 5.6 10*3/uL (ref 1.7–7.7)
Neutrophils Relative %: 75 % (ref 43–77)
Platelets: 240 10*3/uL (ref 150–400)
RBC: 3.86 MIL/uL — ABNORMAL LOW (ref 3.87–5.11)
RDW: 12.8 % (ref 11.5–15.5)
WBC: 7.4 10*3/uL (ref 4.0–10.5)

## 2012-03-04 LAB — BASIC METABOLIC PANEL
BUN: 12 mg/dL (ref 6–23)
CO2: 32 mEq/L (ref 19–32)
Calcium: 9.4 mg/dL (ref 8.4–10.5)
Chloride: 98 mEq/L (ref 96–112)
Creatinine, Ser: 0.65 mg/dL (ref 0.50–1.10)
GFR calc Af Amer: >90 mL/min (ref >90)
GFR calc non Af Amer: 87 mL/min — ABNORMAL LOW (ref >90)
Glucose, Bld: 105 mg/dL — ABNORMAL HIGH (ref 70–99)
Potassium: 4.3 mEq/L (ref 3.5–5.1)
Sodium: 136 mEq/L (ref 135–145)

## 2012-03-04 LAB — POCT I-STAT TROPONIN I: Troponin i, poc: 0.01 ng/mL (ref 0.00–0.08)

## 2012-03-04 LAB — D-DIMER, QUANTITATIVE: D-Dimer, Quant: 0.32 ug/mL-FEU (ref 0.00–0.48)

## 2012-03-04 LAB — TSH: TSH: 1.328 u[IU]/mL (ref 0.350–4.500)

## 2012-03-04 LAB — TROPONIN I: Troponin I: <0.3 ng/mL (ref <0.30)

## 2012-03-04 MED ORDER — TRAMADOL HCL 50 MG PO TABS
50.0000 mg | ORAL_TABLET | Freq: Two times a day (BID) | ORAL | Status: DC | PRN
  Administered 2012-03-04 – 2012-03-05 (×3): 50 mg via ORAL
  Filled 2012-03-04 (×4): qty 1

## 2012-03-04 MED ORDER — METOPROLOL TARTRATE 25 MG PO TABS
25.0000 mg | ORAL_TABLET | Freq: Once | ORAL | Status: AC
Start: 2012-03-04 — End: 2012-03-04
  Administered 2012-03-04: 25 mg via ORAL
  Filled 2012-03-04: qty 1

## 2012-03-04 MED ORDER — ALPRAZOLAM 0.25 MG PO TABS: 0.2500 mg | ORAL_TABLET | Freq: Two times a day (BID) | ORAL | Status: DC | PRN

## 2012-03-04 MED ORDER — ONDANSETRON HCL 4 MG/2ML IJ SOLN
4.0000 mg | Freq: Four times a day (QID) | INTRAMUSCULAR | Status: DC | PRN
  Administered 2012-03-05 (×2): 4 mg via INTRAVENOUS
  Filled 2012-03-04 (×2): qty 2

## 2012-03-04 MED ORDER — PANTOPRAZOLE SODIUM 40 MG PO TBEC
40.0000 mg | DELAYED_RELEASE_TABLET | Freq: Every day | ORAL | Status: DC
  Administered 2012-03-05 – 2012-03-06 (×2): 40 mg via ORAL
  Filled 2012-03-04 (×2): qty 1

## 2012-03-04 MED ORDER — CALCIUM CARBONATE-VITAMIN D 600-400 MG-UNIT PO TABS: 1.0000 | ORAL_TABLET | Freq: Two times a day (BID) | ORAL | Status: DC

## 2012-03-04 MED ORDER — ASPIRIN 81 MG PO CHEW
324.0000 mg | CHEWABLE_TABLET | Freq: Once | ORAL | Status: AC
  Administered 2012-03-04: 324 mg via ORAL
  Filled 2012-03-04: qty 4

## 2012-03-04 MED ORDER — CALCIUM CARBONATE-VITAMIN D 500-200 MG-UNIT PO TABS
1.0000 | ORAL_TABLET | Freq: Two times a day (BID) | ORAL | Status: DC
  Administered 2012-03-04 – 2012-03-06 (×4): 1 via ORAL
  Filled 2012-03-04 (×5): qty 1

## 2012-03-04 MED ORDER — NITROGLYCERIN 2 % TD OINT
1.0000 [in_us] | TOPICAL_OINTMENT | Freq: Three times a day (TID) | TRANSDERMAL | Status: DC
  Administered 2012-03-04 – 2012-03-05 (×4): 1 [in_us] via TOPICAL
  Filled 2012-03-04: qty 30

## 2012-03-04 MED ORDER — DICLOFENAC SODIUM 1 % TD GEL
2.0000 g | Freq: Every day | TRANSDERMAL | Status: DC | PRN
  Filled 2012-03-04: qty 100

## 2012-03-04 MED ORDER — QC WOMENS DAILY MULTIVITAMIN PO TABS: ORAL_TABLET | Freq: Every day | ORAL | Status: DC

## 2012-03-04 MED ORDER — SODIUM CHLORIDE 0.9 % IV SOLN: 250.0000 mL | INTRAVENOUS | Status: DC | PRN

## 2012-03-04 MED ORDER — ATORVASTATIN CALCIUM 40 MG PO TABS
40.0000 mg | ORAL_TABLET | Freq: Every day | ORAL | Status: DC
  Administered 2012-03-04 – 2012-03-05 (×2): 40 mg via ORAL
  Filled 2012-03-04 (×3): qty 1

## 2012-03-04 MED ORDER — SODIUM CHLORIDE 0.9 % IJ SOLN: 3.0000 mL | INTRAMUSCULAR | Status: DC | PRN

## 2012-03-04 MED ORDER — ASPIRIN EC 81 MG PO TBEC
81.0000 mg | DELAYED_RELEASE_TABLET | Freq: Every day | ORAL | Status: DC
  Administered 2012-03-05 – 2012-03-06 (×2): 81 mg via ORAL
  Filled 2012-03-04 (×3): qty 1

## 2012-03-04 MED ORDER — OXYCODONE-ACETAMINOPHEN 5-325 MG PO TABS: 1.0000 | ORAL_TABLET | ORAL | Status: DC | PRN

## 2012-03-04 MED ORDER — QC WOMENS DAILY MULTIVITAMIN PO TABS: 1.0000 | ORAL_TABLET | Freq: Every day | ORAL | Status: DC

## 2012-03-04 MED ORDER — METOPROLOL SUCCINATE ER 25 MG PO TB24
25.0000 mg | ORAL_TABLET | Freq: Every day | ORAL | Status: DC
  Administered 2012-03-05 – 2012-03-06 (×2): 25 mg via ORAL
  Filled 2012-03-04 (×3): qty 1

## 2012-03-04 MED ORDER — ASPIRIN 81 MG PO TBEC: 81.0000 mg | DELAYED_RELEASE_TABLET | Freq: Every day | ORAL | Status: DC

## 2012-03-04 MED ORDER — SODIUM CHLORIDE 0.9 % IJ SOLN
3.0000 mL | Freq: Two times a day (BID) | INTRAMUSCULAR | Status: DC
  Administered 2012-03-04 – 2012-03-06 (×3): 3 mL via INTRAVENOUS

## 2012-03-04 MED ORDER — MOMETASONE FURO-FORMOTEROL FUM 100-5 MCG/ACT IN AERO
2.0000 | INHALATION_SPRAY | Freq: Two times a day (BID) | RESPIRATORY_TRACT | Status: DC
  Administered 2012-03-04 – 2012-03-06 (×4): 2 via RESPIRATORY_TRACT
  Filled 2012-03-04: qty 8.8

## 2012-03-04 MED ORDER — ADULT MULTIVITAMIN W/MINERALS CH
1.0000 | ORAL_TABLET | Freq: Every day | ORAL | Status: DC
  Administered 2012-03-05 – 2012-03-06 (×2): 1 via ORAL
  Filled 2012-03-04 (×2): qty 1

## 2012-03-04 MED ORDER — ACETAMINOPHEN 325 MG PO TABS
650.0000 mg | ORAL_TABLET | ORAL | Status: DC | PRN
  Administered 2012-03-05 (×3): 650 mg via ORAL
  Filled 2012-03-04 (×3): qty 2

## 2012-03-04 MED ORDER — ZOLPIDEM TARTRATE 5 MG PO TABS: 5.0000 mg | ORAL_TABLET | Freq: Every evening | ORAL | Status: DC | PRN

## 2012-03-04 MED ORDER — NITROGLYCERIN 0.4 MG SL SUBL: 0.4000 mg | SUBLINGUAL_TABLET | SUBLINGUAL | Status: DC | PRN

## 2012-03-04 MED ORDER — PANTOPRAZOLE SODIUM 40 MG PO TBEC: 40.0000 mg | DELAYED_RELEASE_TABLET | Freq: Every day | ORAL | Status: DC

## 2012-03-04 NOTE — H&P (Signed)
CARDIOLOGY HISTORY AND PHYSICAL   Patient ID: [name] MRN: [id number] DOB/AGE: [birth year] 71 y.o.  Admit date: 03/04/2012  Primary Physician   GREEN, [name], MD Primary Cardiologist   TS - last seen 01/19/2012 Reason for Consultation   Chest pain  [name] is a 71 y.o. female with a history of CAD. She traveled to [place] last weekend because of the death of her mother. She was tired after that, and developed left chest pain, between 1-4/10. She had DOE, sinus congestion, chest congestion, minimally productive cough (sputum not witnessed) with this. Minimal chest tenderness. The chest pain is not changed by deep inspiration or cough. Today, she had palpitations that reminded her of her MI and had N&V from sinus congestion. She was concerned and went to the emergency room. In the emergency room she received aspirin 325 mg and metoprolol 25 mg. Currently, her chest pain is a 2/10. There has been some radiation up into her left neck and left jaw. She has had no nausea, vomiting or diaphoresis associated with the pain. She has had increased dyspnea on exertion since the pain began but no shortness of breath at rest.   Past Medical History  Diagnosis Date  . Coronary artery disease 08-14-09    status post recent ventricular fibrillation arrest and anterior myocardial infaction with bare-metal stenting of the left anterior   descending cornonary artery  . Hx of heart artery stent     LAD, dCFX 20-30%; RI 70% (small vessel) ..med  Rx;;  normal LVF Echo 10/2009; EF55-60%; ank; Grade 1 diast dysfx; mild LAE  . GERD (gastroesophageal reflux disease)   . Osteoarthritis   . Osteopenia   . Fracture     left sided second and sixth rib   . Compression fracture     T9    Past Surgical History  Procedure Date  . Tonsillectomy   . Appendectomy   . Orif hip fracture   . Leg surgery     R low  . Back surgery     Allergies  Allergen Reactions  . Codeine Nausea And Vomiting  .  Sulfonamide Derivatives Nausea And Vomiting    I have reviewed the patient's current medications    . [COMPLETED] aspirin  324 mg Oral Once  . [COMPLETED] metoprolol tartrate  25 mg Oral Once       Medication Sig  aspirin 81 MG EC tablet Take 81 mg by mouth daily.    Calcium Carbonate-Vitamin D 600-400 MG-UNIT per tablet Take 1 tablet by mouth 2 (two) times daily.   diclofenac sodium (VOLTAREN) 1 % GEL Apply 2 g topically daily as needed. For pain   Fluticasone-Salmeterol 250-50 MCG/DOSE AEPB Inhale 1 puff into the lungs 2 (two) times daily.  metoprolol succinate (TOPROL-XL) 25 MG 24 hr tablet Take 25 mg by mouth daily.  Multiple Vitamins-Minerals TABS Take by mouth daily. with iron   nitroGLYCERIN (NITROSTAT) 0.4 MG SL tablet Place 0.4 mg under the tongue every 5 (five) minutes as needed. For chest pain  pantoprazole (PROTONIX) 40 MG tablet Take 40 mg by mouth daily.  rosuvastatin (CRESTOR) 20 MG tablet Take 20 mg by mouth at bedtime.  Teriparatide, Recombinant, (FORTEO [place]) Inject into the skin at bedtime.      History   Social History  . Marital Status: Married    Spouse Name: N/A    Number of Children: N/A  . Years of Education: N/A   Occupational History  . retired Clinical research associate  Social History Main Topics  . Smoking status: Former Smoker -- 20 years  . Smokeless tobacco: Never Used  . Alcohol Use: Yes     Comment: No history of alcohol abuse  . Drug Use: No  . Sexually Active: Not on file   Other Topics Concern  . Not on file   Social History Narrative  . No narrative on file    Family Status  Relation Status Death Age  . Mother Deceased 1    No hx CAD, died 20-Feb-2023  . Father Deceased 9    No hx CAD     ROS: She has been under a great deal of emotional stress due to the death of her mother although she seems to be dealing with it very well. She has not had any fevers or chills. Her cough has been nonproductive. She has not had any purulent drainage or sputum.  She has had no recent GI symptoms. She has never had palpitations that lasted over a second or 2 and has never had any associated symptoms from them. Full 14 point review of systems complete and found to be negative unless listed above.  Physical Exam: Blood pressure 152/76, pulse 64, temperature 97.5 F (36.4 C), temperature source Oral, resp. rate 18, SpO2 100.00%.  General: Well developed, well nourished, female in no acute distress Head: Eyes PERRLA, No xanthomas.   Normocephalic and atraumatic, oropharynx without edema or exudate. Dentition: good Lungs: Few rales bilaterally but generally clear.  Heart: HRRR S1 S2, no rub/gallop, no murmur. pulses are 2+ all 4 extrem.   Neck: No carotid bruits. No lymphadenopathy.  JVD not elevated. Abdomen: Bowel sounds present, abdomen soft and non-tender without masses or hernias noted. Msk:  No spine or cva tenderness. No weakness, no joint deformities or effusions. Extremities: No clubbing or cyanosis. No edema.  Neuro: Alert and oriented X 3. No focal deficits noted. Psych:  Good affect, responds appropriately Skin: No rashes or lesions noted.  Labs:   Lab Results  Component Value Date   WBC 7.4 03/04/2012   HGB 12.7 03/04/2012   HCT 37.9 03/04/2012   MCV 98.2 03/04/2012   PLT 240 03/04/2012   No results found for this basename: INR in the last 72 hours   Lab 03/04/12 1311  NA 136  K 4.3  CL 98  CO2 32  BUN 12  CREATININE 0.65  CALCIUM 9.4  PROT --  BILITOT --  ALKPHOS --  ALT --  AST --  GLUCOSE 105*    Basename 03/04/12 1407  TROPIPOC 0.01   D-Dimer: pending  Echo: 10/09/2009 Study Conclusions - Left ventricle: The cavity size was normal. Wall thickness was normal. Systolic function was normal. The estimated ejection fraction was in the range of 55% to 60%. Mild mid anterior hypokinesis. Doppler parameters are consistent with abnormal left ventricular relaxation (grade 1 diastolic dysfunction). - Aortic valve:  There was no stenosis. - Mitral valve: Trivial regurgitation. - Left atrium: The atrium was mildly dilated. - Right ventricle: The cavity size was normal. Systolic function was normal. - Pulmonary arteries: PA systolic pressure 24-28 mmHg. - Systemic veins: IVC measured 2.1 cm with normal respirophasic variation, suggesting RA pressure 6-10 mmHg. Impressions: - Normal LV size and systolic function, EF 55-60%. Mild mid anterior hypokinesis. Normal RV size and systolic function with normal PA pressure.  ECG: 04-Mar-2012 12:37:15 [facility]/ED ROUTINE RECORD SINUS RHYTHM ~ normal P axis, V-rate 50- 99 ATRIAL PREMATURE COMPLEX ~ SV  complex w/ short R-R interval BORDERLINE LOW VOLTAGE IN FRONTAL LEADS ~ all frontal leads <0.10mV BORDERLINE R WAVE PROGRESSION, ANTERIOR LEADS ~ R < 0.68mV Standard 12 Lead Report ~ Unconfirmed Interpretation Borderline ECG 54mm/s 63mm/mV 150Hz  8.0.1 12SL 235 CID: [id number] Referred by: Unconfirmed Vent. rate 63 BPM PR interval 152 ms QRS duration 92 ms QT/QTc 392/398 ms P-R-T axes 59 29 82  Radiology:  Dg Chest Port 1 View 03/04/2012  *RADIOLOGY REPORT*  Clinical Data: Shortness of breath  PORTABLE CHEST - 1 VIEW  Comparison: 04/13/2010  Findings: Cardiomediastinal silhouette is stable.  Tortuous descending aorta again noted.  No acute infiltrate or pleural effusion.  No pulmonary edema.  IMPRESSION: No active disease.  No significant change.   Original Report Authenticated By: Natasha Mead, M.D.     ASSESSMENT AND PLAN:   The patient was seen today by Dr Myrtis Ser, the patient evaluated and the data reviewed.  Principal Problem:  *Nonexertional chest pain - she has had pain continuously for greater than 48 hours without acute ECG changes or enzyme elevations. We will continue to cycle cardiac enzymes. She'll be admitted overnight. We will check a d-dimer. If her cardiac enzymes remain negative and her d-dimer is within normal limits, consideration can  be given to discharge in the a.m. with an outpatient stress test. Otherwise, continue home medications   HYPERLIPIDEMIA  C A D  Palpitations  Signed: Theodore Demark 03/04/2012, 4:42 PM Patient seen and examined. I agree with the assessment and plan as detailed above. See also my additional thoughts below.    I Examined the patient in the emergency room. I have reviewed all the information with Mrs.Barrett. I have added my thoughts to the dictation above. The patient has known coronary disease. She travel by airplane to [place] and back recently. It was a tiring trip. She has some upper respiratory congestion. Her d-dimer is normal. She's had some palpitations. She had palpitations At the time of her original ischemic event. Therefore she was concerned. Her chest discomfort has been persistent. There is no definite proof yet that it represents ischemia. The plan will be to monitor her rhythm tonight and to monitor her chest pain. If she does not have any significant arrhythmias and if her enzymes are negative, she can be discharged home tomorrow with outpatient followup.  Willa Rough, MD, [facility] 03/04/2012 5:36 PM

## 2012-03-04 NOTE — ED Notes (Signed)
Pt c/o SOB since Tuesday... Sx include: sinus drainage, dry cough, headache, chest discomfort that's constant, nauseas ... Denies: fevers, vomiting, diarrhea, blurry vision, edema... Pt is alert and responsive w/no acute distress.

## 2012-03-04 NOTE — ED Provider Notes (Signed)
History     CSN: [id number]  Arrival date & time 03/04/12  1226   First MD Initiated Contact with Patient 03/04/12 1230      Chief Complaint  Patient presents with  . Shortness of Breath  . Chest Pain    (Consider location/radiation/quality/duration/timing/severity/associated sxs/prior treatment) Patient is a 71 y.o. female presenting with shortness of breath and chest pain. The history is provided by the patient. No language interpreter was used.  Shortness of Breath  The current episode started today. The onset was gradual. The problem has been unchanged. The problem is mild. Nothing relieves the symptoms. Associated symptoms include chest pain, chest pressure, rhinorrhea, cough, shortness of breath and wheezing. Pertinent negatives include no fever and no sore throat. She was not exposed to toxic fumes. She has not inhaled smoke recently. She has had no prior hospitalizations. She has had prior ICU admissions. She has had no prior intubations. She has been behaving normally. There were sick contacts at home. Recently, medical care has been given by the PCP.  Chest Pain The chest pain began 3 - 5 hours ago. At its most intense, the pain is at 5/10. The pain is currently at 2/10. The severity of the pain is mild. The quality of the pain is described as heavy and pressure-like. The pain radiates to the left shoulder. Chest pain is worsened by exertion. Primary symptoms include shortness of breath, cough and wheezing. Pertinent negatives for primary symptoms include no fever.  Associated symptoms include diaphoresis.  Pertinent negatives for associated symptoms include no lower extremity edema, no near-syncope and no weakness.    71 year old female coming in with chest pain pressure 3/10 at 7:30 this morning after she awoke and was walking around. States that she had shortness of breath the pressure. States that the pressure is a 2/10 presently. States that when the pressure started she felt  palpitations for about 10 minutes. Patient had shortness of breath and nausea with the pressure. He patient took one nitroglycerin with no relief. Patient has a history of a stent in the LAD and of her LAD with similar symptoms. States that the pain is worse with worse with exertion. Patient had upper respiratory symptoms for the past 4 days after flying back from up [place]. She has been using her Advair disc with no relief. Quit smoking 25 years ago.  Past Medical History  Diagnosis Date  . Coronary artery disease 08-14-09    status post recent ventricular fibrillation arrest and anterior myocardial infaction with bare-metal stenting of the left anterior   descending cornonary artery  . Hx of heart artery stent     LAD, dCFX 20-30%; RI 70% (small vessel) ..med  Rx;;  normal LVF Echo 10/2009; EF55-60%; ank; Grade 1 diast dysfx; mild LAE  . GERD (gastroesophageal reflux disease)   . Osteoarthritis   . Osteopenia   . Fracture     left sided second and sixth rib   . Compression fracture     T9    Past Surgical History  Procedure Date  . Tonsillectomy   . Appendectomy   . Orif hip fracture   . Leg surgery     R low  . Back surgery     History reviewed. No pertinent family history.  History  Substance Use Topics  . Smoking status: Former Smoker -- 20 years  . Smokeless tobacco: Never Used  . Alcohol Use: Yes     Comment: No history of alcohol abuse  OB History    Grav Para Term Preterm Abortions TAB SAB Ect Mult Living                  Review of Systems  Constitutional: Positive for diaphoresis. Negative for fever.  HENT: Positive for rhinorrhea. Negative for sore throat.   Respiratory: Positive for cough, shortness of breath and wheezing.   Cardiovascular: Positive for chest pain. Negative for near-syncope.  Neurological: Negative for weakness.    Allergies  Codeine and Sulfonamide derivatives  Home Medications   Current Outpatient Rx  Name  Route  Sig  Dispense   Refill  . ASPIRIN 81 MG PO TBEC   Oral   Take 81 mg by mouth daily.           Marland Kitchen CALCIUM CARBONATE-VITAMIN D 600-400 MG-UNIT PO TABS   Oral   Take 1 tablet by mouth daily.           . CRESTOR 20 MG PO TABS      TAKE 1 TABLET DAILY   90 tablet   2   . DICLOFENAC SODIUM 1 % TD GEL   Topical   Apply topically as needed.         Marland Kitchen FLUTICASONE-SALMETEROL 250-50 MCG/DOSE IN AEPB   Inhalation   Inhale 1 puff into the lungs 2 (two) times daily.   180 each   2   . METOPROLOL SUCCINATE ER 25 MG PO TB24      TAKE 1 TABLET DAILY   90 tablet   0   . QC WOMENS DAILY MULTIVITAMIN PO TABS   Oral   Take by mouth daily. with iron          . NITROGLYCERIN 0.4 MG SL SUBL   Sublingual   Place 1 tablet (0.4 mg total) under the tongue every 5 (five) minutes as needed.   25 tablet   4   . PANTOPRAZOLE SODIUM 40 MG PO TBEC      TAKE 1 TABLET DAILY   90 tablet   2   . FORTEO [place]   Subcutaneous   Inject into the skin daily.            BP 159/92  Pulse 64  Temp 97.5 F (36.4 C) (Oral)  Resp 18  SpO2 98%  Physical Exam  Nursing note and vitals reviewed. Constitutional: She is oriented to person, place, and time. She appears well-developed and well-nourished.  HENT:  Head: Normocephalic and atraumatic.  Eyes: Conjunctivae normal and EOM are normal. Pupils are equal, round, and reactive to light.  Neck: Normal range of motion. Neck supple.  Cardiovascular: Normal rate.   Pulmonary/Chest: Effort normal.  Abdominal: Soft.  Musculoskeletal: Normal range of motion. She exhibits no edema and no tenderness.  Neurological: She is alert and oriented to person, place, and time. She has normal reflexes.  Skin: Skin is warm and dry.  Psychiatric: She has a normal mood and affect.    ED Course  Procedures (including critical care time)   [facility] cardiology will see in er. Spoke with [place] [facility]  Labs Reviewed  CBC WITH DIFFERENTIAL  BASIC METABOLIC PANEL   No results  found.   No diagnosis found.    MDM    Date: 03/04/2012  Rate: 63  Rhythm: normal sinus rhythm  QRS Axis: left  Intervals: normal  ST/T Wave abnormalities: normal  Conduction Disutrbances:none  Narrative Interpretation:   Old EKG Reviewed: unchanged  Chest pressure / SOB today  that increases with exertion.  PMH of stent in 2011 with Dr. Riley Kill.  [place] cards to see in ER today.  - trop x 1.  Chest x-ray unremarkable reviewed by myself.  Other labs unremarkable.   Labs Reviewed  CBC WITH DIFFERENTIAL - Abnormal; Notable for the following:    RBC 3.86 (*)     All other components within normal limits  BASIC METABOLIC PANEL - Abnormal; Notable for the following:    Glucose, Bld 105 (*)     GFR calc non Af Amer 87 (*)     All other components within normal limits  POCT I-STAT TROPONIN I  D-DIMER, QUANTITATIVE         Remi Haggard, NP 03/04/12 1649

## 2012-03-04 NOTE — ED Notes (Signed)
Ordered HH diet tray 

## 2012-03-04 NOTE — ED Notes (Signed)
Pt c/o mid sternal to left sided CP with SOB x 4 days

## 2012-03-04 NOTE — Progress Notes (Addendum)
Pt complaining of chest pain 1-2 out of 10.  EKG obtained per MD order.  Results reviewed with Delma Post, RN.  Nothing acute noted.  Will continue to monitor.

## 2012-03-04 NOTE — ED Provider Notes (Signed)
History     CSN: [id number]  Arrival date & time 03/04/12  1052   First MD Initiated Contact with Patient 03/04/12 1223      Chief Complaint  Patient presents with  . Shortness of Breath    (Consider location/radiation/quality/duration/timing/severity/associated sxs/prior treatment) Patient is a 71 y.o. female presenting with shortness of breath. The history is provided by the patient.  Shortness of Breath  The current episode started 3 to 5 days ago. The onset was gradual. The problem occurs frequently. The problem has been unchanged. The problem is mild. Nothing relieves the symptoms. Nothing aggravates the symptoms. Associated symptoms include chest pain, chest pressure, rhinorrhea, cough and shortness of breath. Pertinent negatives include no orthopnea, no fever, no sore throat and no wheezing. There was no intake of a foreign body. She was not exposed to toxic fumes. She has not inhaled smoke recently. She has had prior hospitalizations. She has had no prior ICU admissions. There were sick contacts at home. She has received no recent medical care.  Patient endorses URI/sinusitis symptoms for one week, reports on Tuesday she noticed sob associated with chest pressure.  Chest pain is constant ache radiating to left shoulder, currently 2/10 but this morning it was increased to 6-7/10.  Pain associated with nausea only.  No known aggravating or alleviating symptoms.  PMH includes MI, stent in LAD, htn and hyperlipidemia.  Evaluated by cardiologist, Dr. Riley Kill, approximately 6 mo ago with no change in medical regimen.    Past Medical History  Diagnosis Date  . Coronary artery disease 08-14-09    status post recent ventricular fibrillation arrest and anterior myocardial infaction with bare-metal stenting of the left anterior   descending cornonary artery  . Hx of heart artery stent     LAD, dCFX 20-30%; RI 70% (small vessel) ..med  Rx;;  normal LVF Echo 10/2009; EF55-60%; ank; Grade 1 diast  dysfx; mild LAE  . GERD (gastroesophageal reflux disease)   . Osteoarthritis   . Osteopenia   . Fracture     left sided second and sixth rib   . Compression fracture     T9    Past Surgical History  Procedure Date  . Tonsillectomy   . Appendectomy   . Orif hip fracture   . Leg surgery     R low  . Back surgery     No family history on file.  History  Substance Use Topics  . Smoking status: Former Smoker -- 20 years  . Smokeless tobacco: Never Used  . Alcohol Use: Yes     Comment: No history of alcohol abuse    OB History    Grav Para Term Preterm Abortions TAB SAB Ect Mult Living                  Review of Systems  Constitutional: Negative for fever.  HENT: Positive for rhinorrhea. Negative for sore throat.   Respiratory: Positive for cough and shortness of breath. Negative for wheezing.   Cardiovascular: Positive for chest pain. Negative for orthopnea.  All other systems reviewed and are negative.    Allergies  Codeine and Sulfonamide derivatives  Home Medications   Current Outpatient Rx  Name  Route  Sig  Dispense  Refill  . ASPIRIN 81 MG PO TBEC   Oral   Take 81 mg by mouth daily.           . CRESTOR 20 MG PO TABS      TAKE 1  TABLET DAILY   90 tablet   2   . FLUTICASONE-SALMETEROL 250-50 MCG/DOSE IN AEPB   Inhalation   Inhale 1 puff into the lungs 2 (two) times daily.   180 each   2   . METOPROLOL SUCCINATE ER 25 MG PO TB24      TAKE 1 TABLET DAILY   90 tablet   0   . NITROGLYCERIN 0.4 MG SL SUBL   Sublingual   Place 1 tablet (0.4 mg total) under the tongue every 5 (five) minutes as needed.   25 tablet   4   . PANTOPRAZOLE SODIUM 40 MG PO TBEC      TAKE 1 TABLET DAILY   90 tablet   2   . FORTEO [place]   Subcutaneous   Inject into the skin daily.          Marland Kitchen CALCIUM CARBONATE-VITAMIN D 600-400 MG-UNIT PO TABS   Oral   Take 1 tablet by mouth daily.           Marland Kitchen DICLOFENAC SODIUM 1 % TD GEL   Topical   Apply topically as  needed.         . QC WOMENS DAILY MULTIVITAMIN PO TABS   Oral   Take by mouth daily. with iron            BP 155/88  Pulse 75  Temp 98.1 F (36.7 C) (Oral)  Resp 22  SpO2 95%  Physical Exam  Nursing note and vitals reviewed. Constitutional: She is oriented to person, place, and time. Vital signs are normal. She appears well-developed and well-nourished. She is active and cooperative.  HENT:  Head: Normocephalic.  Right Ear: External ear normal.  Left Ear: External ear normal.  Nose: Nose normal.  Mouth/Throat: Oropharynx is clear and moist. No oropharyngeal exudate.  Eyes: Conjunctivae normal are normal. Pupils are equal, round, and reactive to light. No scleral icterus.  Neck: Trachea normal and normal range of motion. Neck supple. Carotid bruit is not present.  Cardiovascular: Normal rate, regular rhythm, S1 normal, normal heart sounds, intact distal pulses and normal pulses.   No murmur heard. Pulmonary/Chest: Effort normal and breath sounds normal.  Abdominal: Soft. Bowel sounds are normal. There is no tenderness.  Lymphadenopathy:    She has no cervical adenopathy.  Neurological: She is alert and oriented to person, place, and time. No cranial nerve deficit or sensory deficit.  Skin: Skin is warm and dry.  Psychiatric: She has a normal mood and affect. Her speech is normal and behavior is normal. Judgment and thought content normal. Cognition and memory are normal.    ED Course  Procedures (including critical care time)  Labs Reviewed - No data to display No results found.   1. Chest pain   2. SOB (shortness of breath)   3. Nausea       MDM  Discussed with Dr. Henriette Combs, given PMH and current symptoms pt will be sent to [facility] for further evaluation of chest pain-r/o cardiac etiology.          Johnsie Kindred, NP 03/04/12 1231

## 2012-03-05 ENCOUNTER — Observation Stay (HOSPITAL_COMMUNITY): Payer: Medicare Other

## 2012-03-05 DIAGNOSIS — I517 Cardiomegaly: Secondary | ICD-10-CM

## 2012-03-05 DIAGNOSIS — I2510 Atherosclerotic heart disease of native coronary artery without angina pectoris: Secondary | ICD-10-CM

## 2012-03-05 DIAGNOSIS — R0602 Shortness of breath: Secondary | ICD-10-CM

## 2012-03-05 LAB — COMPREHENSIVE METABOLIC PANEL
ALT: 13 U/L (ref 0–35)
AST: 19 U/L (ref 0–37)
Albumin: 3.2 g/dL — ABNORMAL LOW (ref 3.5–5.2)
Alkaline Phosphatase: 76 U/L (ref 39–117)
BUN: 12 mg/dL (ref 6–23)
CO2: 27 mEq/L (ref 19–32)
Calcium: 9.3 mg/dL (ref 8.4–10.5)
Chloride: 100 mEq/L (ref 96–112)
Creatinine, Ser: 0.58 mg/dL (ref 0.50–1.10)
GFR calc Af Amer: >90 mL/min (ref >90)
GFR calc non Af Amer: 90 mL/min — ABNORMAL LOW (ref >90)
Glucose, Bld: 101 mg/dL — ABNORMAL HIGH (ref 70–99)
Potassium: 4.3 mEq/L (ref 3.5–5.1)
Sodium: 137 mEq/L (ref 135–145)
Total Bilirubin: 0.4 mg/dL (ref 0.3–1.2)
Total Protein: 6.2 g/dL (ref 6.0–8.3)

## 2012-03-05 LAB — LIPASE, BLOOD: Lipase: 33 U/L (ref 11–59)

## 2012-03-05 LAB — AMYLASE: Amylase: 64 U/L (ref 0–105)

## 2012-03-05 LAB — TROPONIN I
Troponin I: <0.3 ng/mL (ref <0.30)
Troponin I: <0.3 ng/mL (ref <0.30)

## 2012-03-05 MED ORDER — IOHEXOL 350 MG/ML SOLN
100.0000 mL | Freq: Once | INTRAVENOUS | Status: AC | PRN
  Administered 2012-03-05: 100 mL via INTRAVENOUS

## 2012-03-05 NOTE — ED Provider Notes (Signed)
Medical screening examination/treatment/procedure(s) were conducted as a shared visit with non-physician practitioner(s) and myself.  I personally evaluated the patient during the encounter   Carlyn Lemke, MD 03/05/12 0701 

## 2012-03-05 NOTE — ED Provider Notes (Signed)
Medical screening examination/treatment/procedure(s) were performed by non-physician practitioner and as supervising physician I was immediately available for consultation/collaboration.   [facility]; MD   Sharin Grave, MD 03/05/12 [phone]

## 2012-03-05 NOTE — Progress Notes (Signed)
Primary cardiologist: Dr. Shawnie Pons  Subjective:   Patient very uncomfortable this morning, complained of experiencing "spasm" in her epigastric area followed by nausea, transient headache, also left shoulder, neck, and upper back discomfort.   Objective:   Temp:  [97.4 F (36.3 C)-98.1 F (36.7 C)] 97.9 F (36.6 C) (11/30 0525) Pulse Rate:  [60-75] 68  (11/30 0952) Resp:  [15-22] 18  (11/30 0525) BP: (116-159)/(60-92) 120/70 mmHg (11/30 0952) SpO2:  [95 %-100 %] 99 % (11/30 0525) Weight:  [162 lb 1.6 oz (73.528 kg)] 162 lb 1.6 oz (73.528 kg) (11/29 1817) Last BM Date: 03/04/12  Filed Weights   03/04/12 1817  Weight: 162 lb 1.6 oz (73.528 kg)    Intake/Output Summary (Last 24 hours) at 03/05/12 0955 Last data filed at 03/05/12 0000  Gross per 24 hour  Intake    150 ml  Output      0 ml  Net    150 ml   Telemetry: Sinus rhythm with rare PVCs.  Exam:  General: Uncomfortable complaining of pain as noted above.  Lungs: Clear to auscultation.  Cardiac: Regular rate and rhythm, no rub or gallop.  Abdomen: NABS, not acutely tender.  Extremities: No pitting edema.  Lab Results:  Basic Metabolic Panel:  Lab 03/05/12 1610 03/04/12 1311  NA 137 136  K 4.3 4.3  CL 100 98  CO2 27 32  GLUCOSE 101* 105*  BUN 12 12  CREATININE 0.58 0.65  CALCIUM 9.3 9.4  MG -- --    Liver Function Tests:  Lab 03/05/12 0510  AST 19  ALT 13  ALKPHOS 76  BILITOT 0.4  PROT 6.2  ALBUMIN 3.2*    CBC:  Lab 03/04/12 1311  WBC 7.4  HGB 12.7  HCT 37.9  MCV 98.2  PLT 240    Cardiac Enzymes:  Lab 03/05/12 0535 03/04/12 2339 03/04/12 1819  CKTOTAL -- -- --  CKMB -- -- --  CKMBINDEX -- -- --  TROPONINI <0.30 <0.30 <0.30    ECG: Serial tracings reviewed including with symptoms this morning, sinus rhythm with decreased R-wave progression, nonspecific ST-T changes.   Medications:   Scheduled Medications:    . [COMPLETED] aspirin  324 mg Oral Once  . aspirin EC   81 mg Oral Daily  . atorvastatin  40 mg Oral q1800  . calcium-vitamin D  1 tablet Oral BID  . metoprolol succinate  25 mg Oral Daily  . [COMPLETED] metoprolol tartrate  25 mg Oral Once  . mometasone-formoterol  2 puff Inhalation BID  . multivitamin with minerals  1 tablet Oral Daily  . nitroGLYCERIN  1 inch Topical Q8H  . pantoprazole  40 mg Oral Daily  . sodium chloride  3 mL Intravenous Q12H  . [DISCONTINUED] aspirin  81 mg Oral Daily  . [DISCONTINUED] aspirin  81 mg Oral Daily  . [DISCONTINUED] Calcium Carbonate-Vitamin D  1 tablet Oral BID  . [DISCONTINUED] pantoprazole  40 mg Oral Daily  . [DISCONTINUED] QC WOMENS DAILY MULTIVITAMIN  1 tablet Oral Daily  . [DISCONTINUED] QC WOMENS DAILY MULTIVITAMIN   Oral Daily     Infusions:     PRN Medications:  sodium chloride, acetaminophen, ALPRAZolam, diclofenac sodium, nitroGLYCERIN, ondansetron (ZOFRAN) IV, sodium chloride, traMADol, zolpidem, [DISCONTINUED] oxyCODONE-acetaminophen   Assessment:   1. Presentation with prolonged thoracic discomfort, patient initially described a heaviness, more recently a more sharp discomfort in the left shoulder, neck, and upper back. Also has experienced some "spasm" in her epigastric area  with nausea. ECGs show no acute ST segment changes, cardiac markers and d-dimer are normal. She has been hemodynamically stable, afebrile, and not hypoxic. Complains of worsening symptoms this morning.  2. Known CAD status post BMS to the LAD in 5/11, LVEF 55-60%.  3. GERD.  4. History of T9 compression fracture in the past.   Plan/Discussion:    Patient admitted by Dr. Myrtis Ser yesterday for observation. She has ruled out for myocardial infarction via cardiac markers and d-dimer level is normal arguing against pulmonary embolus. She does however report worsening symptoms as noted above, describes increasing pain. She is uncomfortable on exam, however has no focal findings to provide a diagnosis. She will not  be discharged home as yet. Patient given medications for pain and nausea, will be scheduled to undergo a chest CT angiogram to exclude aortic dissection or other acute process, chest x-ray was unrevealing. Echocardiogram will also be obtained to assess LV function, evaluate wall motion, and pericardium.   Jonelle Sidle, M.D., F.A.C.C.

## 2012-03-05 NOTE — Progress Notes (Signed)
  Echocardiogram 2D Echocardiogram has been performed.  [name] [name] 03/05/2012, 3:09 PM

## 2012-03-06 DIAGNOSIS — R072 Precordial pain: Secondary | ICD-10-CM

## 2012-03-06 LAB — BASIC METABOLIC PANEL WITH GFR
BUN: 10 mg/dL (ref 6–23)
CO2: 32 meq/L (ref 19–32)
Calcium: 9.4 mg/dL (ref 8.4–10.5)
Chloride: 97 meq/L (ref 96–112)
Creatinine, Ser: 0.63 mg/dL (ref 0.50–1.10)
GFR calc Af Amer: >90 mL/min
GFR calc non Af Amer: 87 mL/min — ABNORMAL LOW
Glucose, Bld: 110 mg/dL — ABNORMAL HIGH (ref 70–99)
Potassium: 4.2 meq/L (ref 3.5–5.1)
Sodium: 135 meq/L (ref 135–145)

## 2012-03-06 LAB — CBC
HCT: 37.9 % (ref 36.0–46.0)
Hemoglobin: 12.9 g/dL (ref 12.0–15.0)
MCH: 33.8 pg (ref 26.0–34.0)
MCHC: 34 g/dL (ref 30.0–36.0)
MCV: 99.2 fL (ref 78.0–100.0)
Platelets: 241 10*3/uL (ref 150–400)
RBC: 3.82 MIL/uL — ABNORMAL LOW (ref 3.87–5.11)
RDW: 12.8 % (ref 11.5–15.5)
WBC: 5.6 10*3/uL (ref 4.0–10.5)

## 2012-03-06 MED ORDER — TRAMADOL HCL 50 MG PO TABS: 50.0000 mg | ORAL_TABLET | Freq: Four times a day (QID) | ORAL | Status: DC | PRN

## 2012-03-06 NOTE — Progress Notes (Signed)
Primary cardiologist: Dr. Shawnie Pons  Subjective:   Patient comfortable this morning, ate breakfast. She reports no further discomfort in her epigastric area, shoulder, chest, or neck. Breathing is normal. States that she had a headache yesterday related to nitroglycerin, patch was removed.   Objective:   Temp:  [98.1 F (36.7 C)-98.7 F (37.1 C)] 98.7 F (37.1 C) (12/01 0421) Pulse Rate:  [65-70] 65  (12/01 0421) Resp:  [18-19] 19  (12/01 0421) BP: (120-128)/(68-77) 128/77 mmHg (12/01 0421) SpO2:  [91 %-98 %] 91 % (12/01 0827) Weight:  [161 lb 9.6 oz (73.3 kg)] 161 lb 9.6 oz (73.3 kg) (12/01 0421) Last BM Date: 03/05/12  Filed Weights   03/04/12 1817 03/06/12 0421  Weight: 162 lb 1.6 oz (73.528 kg) 161 lb 9.6 oz (73.3 kg)    Intake/Output Summary (Last 24 hours) at 03/06/12 0906 Last data filed at 03/06/12 0800  Gross per 24 hour  Intake    122 ml  Output   1300 ml  Net  -1178 ml   Telemetry: Sinus rhythm.  Exam:  General: Comfortable, no complaints.  Lungs: Clear to auscultation.   Cardiac: Regular rate and rhythm, no rub or gallop.   Abdomen: NABS, not acutely tender.   Extremities: No pitting edema.   Lab Results:  Basic Metabolic Panel:  Lab 03/06/12 0865 03/05/12 0510 03/04/12 1311  NA 135 137 136  K 4.2 4.3 4.3  CL 97 100 98  CO2 32 27 32  GLUCOSE 110* 101* 105*  BUN 10 12 12   CREATININE 0.63 0.58 0.65  CALCIUM 9.4 9.3 9.4  MG -- -- --    Liver Function Tests:  Lab 03/05/12 0510  AST 19  ALT 13  ALKPHOS 76  BILITOT 0.4  PROT 6.2  ALBUMIN 3.2*    CBC:  Lab 03/06/12 0615 03/04/12 1311  WBC 5.6 7.4  HGB 12.9 12.7  HCT 37.9 37.9  MCV 99.2 98.2  PLT 241 240    Cardiac Enzymes:  Lab 03/05/12 0535 03/04/12 2339 03/04/12 1819  CKTOTAL -- -- --  CKMB -- -- --  CKMBINDEX -- -- --  TROPONINI <0.30 <0.30 <0.30    ECG: Sinus rhythm with decreased R-wave progression, nonspecific ST-T changes.   Medications:   Scheduled  Medications:    . aspirin EC  81 mg Oral Daily  . atorvastatin  40 mg Oral q1800  . calcium-vitamin D  1 tablet Oral BID  . metoprolol succinate  25 mg Oral Daily  . mometasone-formoterol  2 puff Inhalation BID  . multivitamin with minerals  1 tablet Oral Daily  . nitroGLYCERIN  1 inch Topical Q8H  . pantoprazole  40 mg Oral Daily  . sodium chloride  3 mL Intravenous Q12H    Infusions:    PRN Medications: sodium chloride, acetaminophen, ALPRAZolam, diclofenac sodium, [COMPLETED] iohexol, nitroGLYCERIN, ondansetron (ZOFRAN) IV, sodium chloride, traMADol, zolpidem   Assessment:   1. Presentation with prolonged thoracic discomfort, patient initially described a heaviness, more recently a more sharp discomfort in the left shoulder, neck, and upper back. Also has experienced some "spasm" in her epigastric area with nausea. ECGs showed no acute ST segment changes, cardiac markers and d-dimer are normal. She has been hemodynamically stable, afebrile, and not hypoxic. She was more uncomfortable yesterday morning on rounds, was kept in the hospital for further testing. Chest CTA showed no evidence of aortic dissection, no pulmonary embolus, no acute infiltrates or pleural process, atherosclerotic calcifications as would be expected based  on her history. Amylase and lipase were normal. Echocardiogram obtained showing no pericardial effusion, LVEF approximately 55% - could not entirely exclude basal inferolateral hypokinesis although off axis imaging suspected. Studies were reviewed with the patient this morning.  2. Known CAD status post BMS to the LAD in 5/11, LVEF 55-60%. Moderate residual ramus disease managed medically.  3. GERD.   4. History of T9 compression fracture in the past.   Plan/Discussion:    Testing reviewed with the patient. She states that she feels much better today, and would like to go home. Original plan was for her to have outpatient stress testing following hospital  observation, and this remains the case, although we did also discuss the possibility of keeping her for a diagnostic cardiac catheterization tomorrow. We will have her ambulate in the hall this morning, and if she does well without recurrent symptoms, we will plan discharge home today which is her preference, and schedule a Lexiscan Myoview in the office within the next 48 hours. If on the other hand she experiences recurrent symptoms while walking in the hall, we will keep her for cardiac catheterization tomorrow.   Jonelle Sidle, M.D., F.A.C.C.

## 2012-03-06 NOTE — Discharge Summary (Signed)
CARDIOLOGY DISCHARGE SUMMARY   Patient ID: Mehreen Azizi MRN: [id number] DOB/AGE: 72-25-1942 72 y.o.  Admit date: 03/04/2012 Discharge date: 03/06/2012  Primary Discharge Diagnosis:   *Nonexertional chest pain Secondary Discharge Diagnosis:   HYPERLIPIDEMIA  C A D  Palpitations  Precordial pain  Procedures: 2D Echocardiogram, CTA chest   Hospital Course: [name] is a 71 year old female with a history of CAD. She had chest pain and came to the hospital where she was admitted for further evaluation and treatment.  Her cardiac enzymes were negative for MI and her ECG was not acute. She had recurrent pain associated with SOB so a CT of the chest was performed. It did not show any abnormality. Her chest Xray was reviewed and no acute problems were seen. There were no significant arrhythmias and her oxygenation was good. An echocardiogram showed no acute abnormalities.   By 03/06/2012, [name] Funez had greatly improved. She was ambulating without difficulty. Dr Diona Browner discussed the situation and test results with her. If she has further pain, a heart catheterization is indicated but since she did well with ambulation, she was considered stable for discharge home, to follow up as an outpatient with an early stress test.  Labs:   Lab Results  Component Value Date   WBC 5.6 03/06/2012   HGB 12.9 03/06/2012   HCT 37.9 03/06/2012   MCV 99.2 03/06/2012   PLT 241 03/06/2012     Lab 03/06/12 0615 03/05/12 0510  NA 135 --  K 4.2 --  CL 97 --  CO2 32 --  BUN 10 --  CREATININE 0.63 --  CALCIUM 9.4 --  PROT -- 6.2  BILITOT -- 0.4  ALKPHOS -- 76  ALT -- 13  AST -- 19  GLUCOSE 110* --    Basename 03/05/12 0535 03/04/12 2339 03/04/12 1819  CKTOTAL -- -- --  CKMB -- -- --  CKMBINDEX -- -- --  TROPONINI <0.30 <0.30 <0.30     Radiology: Dg Chest Port 1 View 03/04/2012  *RADIOLOGY REPORT*  Clinical Data: Shortness of breath  PORTABLE CHEST - 1 VIEW  Comparison: 04/13/2010  Findings:  Cardiomediastinal silhouette is stable.  Tortuous descending aorta again noted.  No acute infiltrate or pleural effusion.  No pulmonary edema.  IMPRESSION: No active disease.  No significant change.   Original Report Authenticated By: Natasha Mead, M.D.    Ct Angio Chest Aortic Dissect W &/or W/o 03/05/2012  *RADIOLOGY REPORT*  Clinical Data: Chest pain.  Left shoulder pain.  Shortness of breath.  CT ANGIOGRAPHY CHEST  Technique:  Multidetector CT imaging of the chest using the standard protocol during bolus administration of intravenous contrast. Multiplanar reconstructed images including MIPs were obtained and reviewed to evaluate the vascular anatomy.  Contrast: OMNIPAQUE IOHEXOL 350 MG/ML SOLN  Comparison: One-view chest 03/04/2012.  Findings: Opacification of the aorta and pulmonary arteries is excellent.  Atherosclerotic calcifications are present in the aorta and at the origin of the branch vessels of the arch.  Coronary calcifications are present as well.  There is a common origin of the left common carotid artery and the innominate artery.  Focal tortuosity is present within the proximal right common carotid artery without a significant stenosis.  The vertebral arteries both originate from the subclavian arteries without a significant stenosis.  Coronary artery calcifications are present.  There is no significant pleural or pericardial effusion.  Limited imaging of the abdomen demonstrates a 10 mm hypodense lesion in the posterior right lobe of  the liver on image 118 of series 4.  An exophytic cystic lesion along the posterior right kidney is incompletely imaged.  It measures at least 3.4 x 2.3 cm.  Pulmonary arterial opacification is satisfactory.  There are no focal filling defects to suggest pulmonary emboli.  The bone windows demonstrate leftward curvature of the mid thoracic spine.  The patient is status post vertebral augmentation at L1.  A remote vertebral body fracture is present at T9.  The  vertebral body heights are otherwise maintained.  The lung windows demonstrates mild centrilobular emphysematous changes.  No significant nodule, mass, or airspace disease is present.  IMPRESSION:  1.  No evidence for aortic dissection or pulmonary embolus. 2.  Atherosclerotic changes and calcification are present. 3.  Mild centrilobular emphysematous change.   Original Report Authenticated By: Marin Roberts, M.D.    EKG: 05-Mar-2012 09:37:24 [facility] ROUTINE RECORD Normal sinus rhythm Nonspecific T wave abnormality Abnormal ECG 26mm/s 24mm/mV 100Hz  8.0.1 12SL 241 HD CID: 1 Referred by: KATZ JEFF Unconfirmed Vent. rate 61 BPM PR interval 130 [name] QRS duration 90 [name] QT/QTc 392/394 [name] P-R-T axes 34 -12 78  Echo: 03/05/2012 Study Conclusions - Left ventricle: The cavity size was normal. Wall thickness was increased in a pattern of mild LVH. The estimated ejection fraction was 55%. Cannot exclude hypokinesis of the basal inferolateral myocardium - imaging may be off axis. Doppler parameters are consistent with abnormal left ventricular relaxation (grade 1 diastolic dysfunction). Doppler parameters are consistent with borderline elevated ventricular end-diastolic filling pressure. - Aortic valve: Trileaflet; mildly calcified leaflets. - Mitral valve: Trivial regurgitation. - Left atrium: The atrium was mildly dilated. - Tricuspid valve: Trivial regurgitation. - Pulmonary arteries: PA peak pressure: 30mm Hg (S). - Pericardium, extracardiac: There was no pericardial effusion.    FOLLOW UP PLANS AND APPOINTMENTS Allergies  Allergen Reactions  . Codeine Nausea And Vomiting  . Sulfonamide Derivatives Nausea And Vomiting     Medication List     As of 03/06/2012  2:05 PM    TAKE these medications         aspirin 81 MG EC tablet   Take 81 mg by mouth daily.      Calcium Carbonate-Vitamin D 600-400 MG-UNIT per tablet   Take 1 tablet by mouth 2 (two) times  daily.      diclofenac sodium 1 % Gel   Commonly known as: VOLTAREN   Apply 2 g topically daily as needed. For pain in hands      Fluticasone-Salmeterol 250-50 MCG/DOSE Aepb   Commonly known as: ADVAIR   Inhale 1 puff into the lungs 2 (two) times daily.      FORTEO [place]   Inject into the skin at bedtime.      metoprolol succinate 25 MG 24 hr tablet   Commonly known as: TOPROL-XL   Take 25 mg by mouth daily.      nitroGLYCERIN 0.4 MG SL tablet   Commonly known as: NITROSTAT   Place 0.4 mg under the tongue every 5 (five) minutes as needed. For chest pain      pantoprazole 40 MG tablet   Commonly known as: PROTONIX   Take 40 mg by mouth daily.      QC WOMENS DAILY MULTIVITAMIN Tabs   Take by mouth daily. with iron      rosuvastatin 20 MG tablet   Commonly known as: CRESTOR   Take 20 mg by mouth at bedtime.      traMADol  50 MG tablet   Commonly known as: ULTRAM   Take 1 tablet (50 mg total) by mouth every 6 (six) hours as needed for pain.          Discharge Orders    Future Orders Please Complete By Expires   Diet - low sodium heart healthy      Increase activity slowly        Follow-up Information    Follow up with Shawnie Pons, MD. (The office will call.)    Contact information:   [address] [place] [address] [phone]          BRING ALL MEDICATIONS WITH YOU TO FOLLOW UP APPOINTMENTS  Time spent with patient to include physician time: 36 min Signed: Theodore Demark 03/06/2012, 2:05 PM Co-Sign MD

## 2012-03-06 NOTE — Progress Notes (Signed)
Pt ambulated, unassisted, to the opposite end of hall, around semi circle, and back to room.  Pt states she feels well.  Denies CP, SOB.  No HR abnormalities on monitor.  Pt would like to go home today.  Will notify PA on call.

## 2012-03-07 ENCOUNTER — Telehealth: Payer: Self-pay | Admitting: Cardiology

## 2012-03-07 DIAGNOSIS — R079 Chest pain, unspecified: Secondary | ICD-10-CM

## 2012-03-07 NOTE — Telephone Encounter (Signed)
plz return call to pt, who has questions about [facility] Stuckey appnt, and stress test.  I reviewed DC notes and it only says stress test, doesn't say 48 hrs as indicated by patient. She would like to discuss this with nurse.

## 2012-03-07 NOTE — Telephone Encounter (Signed)
Called pt back and confirmed no caffeine after midnight, but that she can get up and eat food without caffeine 4 hours prior to the test.

## 2012-03-07 NOTE — Telephone Encounter (Signed)
Pt states she needs a stress test.  She is scheduled for tomorrow am.  Do you just want to see her or should I reschedule her for a stress test?  D/C summary does not state if it should be a regular stress test or a nuclear?  Which should she have?  Thanks.

## 2012-03-07 NOTE — Telephone Encounter (Signed)
I reviewed all of her notes.  Looks like the plan was for a stress test after d/c.  It would be more beneficial to be seen in follow up after the stress test.  She should have been set up for ETT-Myoview (if she can walk) or [facility], then follow up.  Not sure seeing her tomorrow will be useful unless her symptoms have significantly changed since d/c.  I would suggest setting up her nuclear scan for tomorrow and then follow up in the next 2 weeks with either TS or me on a day TS in the office. Tereso Newcomer, PA-C  2:23 PM 03/07/2012

## 2012-03-07 NOTE — Telephone Encounter (Signed)
Pt was scheduled for a myoview tomorrow, (03/08/12) and an appt with Tereso Newcomer PA-C on 03/14/12.  She was notified of dates and times.  She was given the [facility] instructions.

## 2012-03-07 NOTE — Telephone Encounter (Signed)
Pt calling back to talk with debbie lefler, pls call [phone]

## 2012-03-07 NOTE — Discharge Summary (Signed)
Please refer to my complete chart note from date of discharge.

## 2012-03-08 ENCOUNTER — Ambulatory Visit (HOSPITAL_COMMUNITY): Payer: Medicare Other | Attending: Cardiovascular Disease | Admitting: Radiology

## 2012-03-08 ENCOUNTER — Ambulatory Visit: Payer: Medicare Other | Admitting: Physician Assistant

## 2012-03-08 VITALS — BP 139/89 | Ht 65.0 in | Wt 156.0 lb

## 2012-03-08 DIAGNOSIS — R0609 Other forms of dyspnea: Secondary | ICD-10-CM | POA: Insufficient documentation

## 2012-03-08 DIAGNOSIS — R42 Dizziness and giddiness: Secondary | ICD-10-CM | POA: Insufficient documentation

## 2012-03-08 DIAGNOSIS — R002 Palpitations: Secondary | ICD-10-CM | POA: Insufficient documentation

## 2012-03-08 DIAGNOSIS — R079 Chest pain, unspecified: Secondary | ICD-10-CM

## 2012-03-08 DIAGNOSIS — J449 Chronic obstructive pulmonary disease, unspecified: Secondary | ICD-10-CM | POA: Insufficient documentation

## 2012-03-08 DIAGNOSIS — R000 Tachycardia, unspecified: Secondary | ICD-10-CM | POA: Insufficient documentation

## 2012-03-08 DIAGNOSIS — I2510 Atherosclerotic heart disease of native coronary artery without angina pectoris: Secondary | ICD-10-CM

## 2012-03-08 DIAGNOSIS — R0602 Shortness of breath: Secondary | ICD-10-CM

## 2012-03-08 DIAGNOSIS — J4489 Other specified chronic obstructive pulmonary disease: Secondary | ICD-10-CM | POA: Insufficient documentation

## 2012-03-08 DIAGNOSIS — R0989 Other specified symptoms and signs involving the circulatory and respiratory systems: Secondary | ICD-10-CM | POA: Insufficient documentation

## 2012-03-08 DIAGNOSIS — R0789 Other chest pain: Secondary | ICD-10-CM | POA: Insufficient documentation

## 2012-03-08 MED ORDER — TECHNETIUM TC 99M SESTAMIBI GENERIC - CARDIOLITE
33.0000 | Freq: Once | INTRAVENOUS | Status: AC | PRN
  Administered 2012-03-08: 33 via INTRAVENOUS

## 2012-03-08 MED ORDER — TECHNETIUM TC 99M SESTAMIBI GENERIC - CARDIOLITE
11.0000 | Freq: Once | INTRAVENOUS | Status: AC | PRN
  Administered 2012-03-08: 11 via INTRAVENOUS

## 2012-03-08 NOTE — Progress Notes (Signed)
[facility] SITE 3 NUCLEAR MED [id number] [place] [address] [phone]  Cardiology Nuclear Med Study  [name] is a 71 y.o. female     MRN : [id number]     DOB: March 01, 1941  Procedure Date: 03/08/2012  Nuclear Med Background Indication for Stress Test:  Evaluation for Ischemia, Stent Patency and Post Hospital:03/04/12 ED at [facility] with Chest Pain,SOB and nausea  History:  COPD;5/11 MI-Anterior with VF arrest>Heart Catheterization>stent LAD;09/11 MPS:EF=65%,no ischemia;Heart Catheteriztion:[place] 70-75% and treat medically;1/12 Heart Catheterization:Stent patent11/30/13 Echo EF=55%,Mild LVH: Cardiac Risk Factors: History of Smoking and Lipids  Symptoms:  Chest Pain with radiation to Left shoulder(last date of chest discomfort 2 days ago), Chest Pressure at rest and  with Exertion (last date of chest discomfort 2 days ago), Diaphoresis, Dizziness, DOE, Fatigue, Light-Headedness, Nausea, Palpitations, Rapid HR and SOB   Nuclear Pre-Procedure Caffeine/Decaff Intake:  None NPO After: 4:30am   Lungs:  clear  IV 0.9% NS with Angio Cath:  22g  IV Site: R Hand  IV Started by:  Cathlyn Parsons, RN  Chest Size (in):  38 Cup Size: D  Height: 5\' 5"  (1.651 m)  Weight:  156 lb (70.761 kg)  BMI:  Body mass index is 25.96 kg/(m^2). Tech Comments:  Toprol taken at 0430    Nuclear Med Study 1 or 2 day study: 1 day  Stress Test Type:  Stress  Reading MD: Charlton Haws, MD  Order Authorizing Provider:  Blenda Nicely  Resting Radionuclide: Technetium 81m Sestamibi  Resting Radionuclide Dose: 11.0 mCi   Stress Radionuclide:  Technetium 65m Sestamibi  Stress Radionuclide Dose: 33.0 mCi           Stress Protocol Rest HR: 58 Stress HR: 136  Rest BP: 139/89 Stress BP: 160/107  Exercise Time (min): 4:38 METS: 6.6   Predicted Max HR: 148 bpm % Max HR: 91.89 bpm Rate Pressure Product: 95638   Dose of Adenosine (mg):  n/a Dose of Lexiscan: n/a mg  Dose  of Atropine (mg): n/a Dose of Dobutamine: n/a mcg/kg/min (at max HR)  Stress Test Technologist: Cathlyn Parsons, RN  Nuclear Technologist:  Domenic Polite, CNMT     Rest Procedure:  Myocardial perfusion imaging was performed at rest 45 minutes following the intravenous administration of Technetium 74m Sestamibi. Rest ECG: Poor R wave progression  Stress Procedure:  The patient performed treadmill exercise using a Bruce  Protocol for 4:38 minutes. The patient stopped due to moderate SOB,dizziness,fatigue and target heart rate achieved. Patient denied any chest pain with exercise. Patient developed chest heaviness 8/10 in recovery. Patient received NTG 0.4mg  SL for continued chest heaviness. Asymptomatic after 12 minutes in recovery. Technetium 83m Sestamibi was injected at peak exercise and myocardial perfusion imaging was performed after a brief delay.  Patient developed chest pain 3-4/10 during stress images. Dr. Excell Seltzer consulted with ECG,symptoms and images. Patient discharged to home and will follow up with Dr. Riley Kill per Dr. Excell Seltzer. Stress ECG: No significant change from baseline ECG  QPS Raw Data Images:  Normal; no motion artifact; normal heart/lung ratio. Stress Images:  Normal homogeneous uptake in all areas of the myocardium. Rest Images:  Normal homogeneous uptake in all areas of the myocardium. Subtraction (SDS):  Normal Transient Ischemic Dilatation (Normal <1.22):  0.91 Lung/Heart Ratio (Normal <0.45):  0.36  Quantitative Gated Spect Images QGS EDV:  71 ml QGS ESV:  23 ml  Impression Exercise Capacity:  Fair exercise capacity. BP Response:  Normal blood pressure response. Clinical  Symptoms:  Chest pain and dyspnea nitro given x2 ECG Impression:  No significant ST segment change suggestive of ischemia. Comparison with Prior Nuclear Study: No images to compare  Overall Impression:  Normal nuclear images and no ECG changes but significant symptoms requiring nitro  LV  Ejection Fraction: 67%.  LV Wall Motion:  NL LV Function; NL Wall Motion or Normal Wall Motion    Charlton Haws

## 2012-03-10 ENCOUNTER — Telehealth: Payer: Self-pay | Admitting: Cardiovascular Disease

## 2012-03-10 NOTE — Telephone Encounter (Signed)
New problem:   Discuss stress test results.

## 2012-03-10 NOTE — Telephone Encounter (Signed)
I spoke with the pt and made her aware of myoview results.  The pt did have symptoms during her test.  The pt does not feel well after her stress test and would like to know what is the next step.  The pt is scheduled to see Tereso Newcomer PA-C on 03/14/12.  I have rescheduled the pt's appointment to 03/11/12 with Dr Riley Kill.

## 2012-03-11 ENCOUNTER — Ambulatory Visit (INDEPENDENT_AMBULATORY_CARE_PROVIDER_SITE_OTHER): Payer: Medicare Other | Admitting: Cardiology

## 2012-03-11 ENCOUNTER — Encounter: Payer: Self-pay | Admitting: Cardiology

## 2012-03-11 VITALS — BP 122/77 | HR 69 | Ht 65.5 in | Wt 138.8 lb

## 2012-03-11 DIAGNOSIS — R072 Precordial pain: Secondary | ICD-10-CM

## 2012-03-11 NOTE — Progress Notes (Signed)
HPI:  Patient returns today in a followup visit. We reviewed her studies in detail. She presented with some shortness of breath, and underwent a significant workup which included both echocardiography as well as nuclear imaging. In addition, she had CAT scan which excluded aortic dissection, and rule out pulmonary embolus. She did have incidental cysts in the liver and kidney.  Notably, during exercise testing, she did develop some chest tightness, but did not have EKG changes, nor did she have a perfusion defect. That said, she does have known coronary artery disease, and have moderate stenosis of the ramus intermedius. She declined cardiac catheterization the hospital. She feels in retrospect that she had too much going on, and wonders if this might not have been the cause of her symptoms. She is somewhat better such she had a stress test done recently. We talked about various strategies, and reviewed her studies with her in detail. She denies significant current chest pain with exertion.  Current Outpatient Prescriptions  Medication Sig Dispense Refill  . aspirin 81 MG EC tablet Take 81 mg by mouth daily.        . Calcium Carbonate-Vitamin D 600-400 MG-UNIT per tablet Take 1 tablet by mouth 2 (two) times daily.       . diclofenac sodium (VOLTAREN) 1 % GEL Apply 2 g topically daily as needed. For pain in hands      . Fluticasone-Salmeterol (ADVAIR [place]) 250-50 MCG/DOSE AEPB Inhale 1 puff into the lungs 2 (two) times daily.  180 each  2  . metoprolol succinate (TOPROL-XL) 25 MG 24 hr tablet Take 25 mg by mouth daily.      . Multiple Vitamins-Minerals (QC WOMENS DAILY MULTIVITAMIN) TABS Take by mouth daily. with iron       . nitroGLYCERIN (NITROSTAT) 0.4 MG SL tablet Place 0.4 mg under the tongue every 5 (five) minutes as needed. For chest pain      . pantoprazole (PROTONIX) 40 MG tablet Take 40 mg by mouth daily.      . rosuvastatin (CRESTOR) 20 MG tablet Take 20 mg by mouth at bedtime.      .  Teriparatide, Recombinant, (FORTEO [place]) Inject into the skin at bedtime.       . traMADol (ULTRAM) 50 MG tablet Take 1 tablet (50 mg total) by mouth every 6 (six) hours as needed for pain.  20 tablet  0    Allergies  Allergen Reactions  . Codeine Nausea And Vomiting  . Sulfonamide Derivatives Nausea And Vomiting    Past Medical History  Diagnosis Date  . Coronary artery disease 08-14-09    status post recent ventricular fibrillation arrest and anterior myocardial infaction with bare-metal stenting of the left anterior   descending cornonary artery  . Hx of heart artery stent     LAD, dCFX 20-30%; RI 70% (small vessel) ..med  Rx;;  normal LVF Echo 10/2009; EF55-60%; ank; Grade 1 diast dysfx; mild LAE  . GERD (gastroesophageal reflux disease)   . Osteoarthritis   . Osteopenia   . Fracture     left sided second and sixth rib   . Compression fracture     T9    Past Surgical History  Procedure Date  . Tonsillectomy   . Appendectomy   . Orif hip fracture   . Leg surgery     R low  . Back surgery     No family history on file.  History   Social History  . Marital Status: Married  Spouse Name: N/A    Number of Children: N/A  . Years of Education: N/A   Occupational History  . retired Clinical research associate    Social History Main Topics  . Smoking status: Former Smoker -- 20 years  . Smokeless tobacco: Never Used  . Alcohol Use: Yes     Comment: No history of alcohol abuse  . Drug Use: No  . Sexually Active: Not on file   Other Topics Concern  . Not on file   Social History Narrative  . No narrative on file    ROS: Please see the HPI.  All other systems reviewed and negative.  PHYSICAL EXAM:  BP 122/77  Pulse 69  Ht 5' 5.5" (1.664 m)  Wt 138 lb 12.8 oz (62.959 kg)  BMI 22.75 kg/m2  SpO2 99%  General: Well developed, well nourished, in no acute distress. Head:  Normocephalic and atraumatic. Neck: no JVD Lungs: Clear to auscultation and percussion. Heart: Normal S1  and S2.  No murmur, rubs or gallops.  Pulses: Pulses normal in all 4 extremities. Extremities: No clubbing or cyanosis. No edema. Neurologic: Alert and oriented x 3.  EKG:  NUCLEAR  Impression  Exercise Capacity: Fair exercise capacity.  BP Response: Normal blood pressure response.  Clinical Symptoms: Chest pain and dyspnea nitro given x2  ECG Impression: No significant ST segment change suggestive of ischemia.  Comparison with Prior Nuclear Study: No images to compare  Overall Impression: Normal nuclear images and no ECG changes but significant symptoms requiring nitro  LV Ejection Fraction: 67%. LV Wall Motion: NL LV Function; NL Wall Motion or Normal Wall Motion   CT CT ANGIOGRAPHY CHEST  Technique: Multidetector CT imaging of the chest using the standard protocol during bolus administration of intravenous contrast. Multiplanar reconstructed images including MIPs were obtained and reviewed to evaluate the vascular anatomy.  Contrast: OMNIPAQUE IOHEXOL 350 MG/ML SOLN  Comparison: One-view chest 03/04/2012.  Findings: Opacification of the aorta and pulmonary arteries is excellent.  Atherosclerotic calcifications are present in the aorta and at the origin of the branch vessels of the arch. Coronary calcifications are present as well.  There is a common origin of the left common carotid artery and the innominate artery. Focal tortuosity is present within the proximal right common carotid artery without a significant stenosis. The vertebral arteries both originate from the subclavian arteries without a significant stenosis. Coronary artery calcifications are present. There is no significant pleural or pericardial effusion.  Limited imaging of the abdomen demonstrates a 10 mm hypodense lesion in the posterior right lobe of the liver on image 118 of series 4. An exophytic cystic lesion along the posterior right kidney is incompletely imaged. It measures at least 3.4  x 2.3 cm.  Pulmonary arterial opacification is satisfactory. There are no focal filling defects to suggest pulmonary emboli.  The bone windows demonstrate leftward curvature of the mid thoracic spine. The patient is status post vertebral augmentation at L1. A remote vertebral body fracture is present at T9. The vertebral body heights are otherwise maintained.  The lung windows demonstrates mild centrilobular emphysematous changes. No significant nodule, mass, or airspace disease is present.  IMPRESSION:  1. No evidence for aortic dissection or pulmonary embolus. 2. Atherosclerotic changes and calcification are present. 3. Mild centrilobular emphysematous change.   Original Report Authenticated By: Marin Roberts, M.D.        Last Resulted: 03/05/12 12:21 PM       ASSESSMENT AND PLAN:

## 2012-03-11 NOTE — Assessment & Plan Note (Signed)
On etiology of her symptoms is uncertain. I would have a fairly low threshold for repeat cardiac catheterization if she would have recurrent symptoms. Her myocardial perfusion imaging study was negative, and the CT scan findings are as noted. Her echocardiogram demonstrates preserved global left ventricular systolic function with question of mild wall motion abnormality. At the present time, she will continue on medical therapy and we will see her back in followup in the near future. Should she have recurrent symptoms she is to call us, and we will see her promptly.

## 2012-03-11 NOTE — Patient Instructions (Addendum)
Your physician recommends that you schedule a follow-up appointment in: 1 MONTH with Dr Riley Kill  Your physician recommends that you continue on your current medications as directed. Please refer to the Current Medication list given to you today.

## 2012-03-14 ENCOUNTER — Ambulatory Visit: Payer: Medicare Other | Admitting: Physician Assistant

## 2012-04-12 ENCOUNTER — Other Ambulatory Visit: Payer: Self-pay | Admitting: *Deleted

## 2012-04-12 MED ORDER — METOPROLOL SUCCINATE ER 25 MG PO TB24: 25.0000 mg | ORAL_TABLET | Freq: Every day | ORAL | Status: DC

## 2012-04-13 ENCOUNTER — Ambulatory Visit: Payer: Medicare Other | Admitting: Cardiology

## 2012-05-16 ENCOUNTER — Telehealth: Payer: Self-pay | Admitting: *Deleted

## 2012-05-16 NOTE — Telephone Encounter (Signed)
Pt was told to contact her pcp regarding this refill.

## 2012-05-16 NOTE — Telephone Encounter (Signed)
Pt states she needs Tramadol refilled, not sure if  Dr. Myrtis Ser fills this med will route to his nurse

## 2012-06-16 ENCOUNTER — Ambulatory Visit (INDEPENDENT_AMBULATORY_CARE_PROVIDER_SITE_OTHER): Payer: Medicare Other | Admitting: Cardiology

## 2012-06-16 ENCOUNTER — Encounter: Payer: Self-pay | Admitting: Cardiology

## 2012-06-16 VITALS — BP 130/72 | HR 63 | Ht 65.5 in | Wt 159.1 lb

## 2012-06-16 DIAGNOSIS — I2510 Atherosclerotic heart disease of native coronary artery without angina pectoris: Secondary | ICD-10-CM

## 2012-06-16 DIAGNOSIS — E785 Hyperlipidemia, unspecified: Secondary | ICD-10-CM

## 2012-06-16 NOTE — Progress Notes (Signed)
HPI:  She is doing better.  Has not been active due to the weather.  She is going to start doing more.  She has started back with physical therapy, mainly for her back and balance.  Otherwise stable.    Current Outpatient Prescriptions  Medication Sig Dispense Refill  . aspirin 81 MG EC tablet Take 81 mg by mouth daily.        . Calcium Carbonate-Vitamin D 600-400 MG-UNIT per tablet Take 1 tablet by mouth 2 (two) times daily.       . diclofenac sodium (VOLTAREN) 1 % GEL Apply 2 g topically daily as needed. For pain in hands      . Fluticasone-Salmeterol (ADVAIR DISKUS) 250-50 MCG/DOSE AEPB Inhale 1 puff into the lungs 2 (two) times daily.  180 each  2  . metoprolol succinate (TOPROL-XL) 25 MG 24 hr tablet Take 1 tablet (25 mg total) by mouth daily.  90 tablet  3  . Multiple Vitamins-Minerals (QC WOMENS DAILY MULTIVITAMIN) TABS Take by mouth daily. with iron       . nitroGLYCERIN (NITROSTAT) 0.4 MG SL tablet Place 0.4 mg under the tongue every 5 (five) minutes as needed. For chest pain      . pantoprazole (PROTONIX) 40 MG tablet Take 40 mg by mouth daily.      . rosuvastatin (CRESTOR) 20 MG tablet Take 20 mg by mouth at bedtime.      . Teriparatide, Recombinant, (FORTEO [place]) Inject into the skin at bedtime.       . traMADol (ULTRAM) 50 MG tablet Take 1 tablet (50 mg total) by mouth every 6 (six) hours as needed for pain.  20 tablet  0   No current facility-administered medications for this visit.    Allergies  Allergen Reactions  . Codeine Nausea And Vomiting  . Sulfonamide Derivatives Nausea And Vomiting    Past Medical History  Diagnosis Date  . Coronary artery disease 08-14-09    status post recent ventricular fibrillation arrest and anterior myocardial infaction with bare-metal stenting of the left anterior   descending cornonary artery  . Hx of heart artery stent     LAD, dCFX 20-30%; RI 70% (small vessel) ..med  Rx;;  normal LVF Echo 10/2009; EF55-60%; ank; Grade 1 diast dysfx;  mild LAE  . GERD (gastroesophageal reflux disease)   . Osteoarthritis   . Osteopenia   . Fracture     left sided second and sixth rib   . Compression fracture     T9    Past Surgical History  Procedure Laterality Date  . Tonsillectomy    . Appendectomy    . Orif hip fracture    . Leg surgery      R low  . Back surgery      No family history on file.  History   Social History  . Marital Status: Married    Spouse Name: N/A    Number of Children: N/A  . Years of Education: N/A   Occupational History  . retired Clinical research associate    Social History Main Topics  . Smoking status: Former Smoker -- 20 years  . Smokeless tobacco: Never Used  . Alcohol Use: Yes     Comment: No history of alcohol abuse  . Drug Use: No  . Sexually Active: Not on file   Other Topics Concern  . Not on file   Social History Narrative  . No narrative on file    ROS: Please see the  HPI.  All other systems reviewed and negative.  PHYSICAL EXAM:  BP 130/72  Pulse 63  Ht 5' 5.5" (1.664 m)  Wt 159 lb 1.9 oz (72.176 kg)  BMI 26.07 kg/m2  SpO2 92%  General: Well developed, well nourished, in no acute distress. Head:  Normocephalic and atraumatic. Neck: no JVD Lungs: Clear to auscultation and percussion. Heart: Normal S1 and S2.  No murmur, rubs or gallops.  Abdomen:  Normal bowel sounds; soft; non tender; no organomegaly Pulses: Pulses normal in all 4 extremities. Extremities: No clubbing or cyanosis. No edema. Neurologic: Alert and oriented x 3.  EKG:  NSR.  No acute changes.    ASSESSMENT AND PLAN:  1.  RTC with Dr. Shirlee Latch  -- FU with Dr. Shirlee Latch and Dr. Elmore Guise as planned 2.  Increase ambulation.   3.  No other changes.

## 2012-06-16 NOTE — Patient Instructions (Signed)
Your physician wants you to follow-up in: 6 MONTHS with Dr McLean (previous pt of Dr Stuckey). You will receive a reminder letter in the mail two months in advance. If you don't receive a letter, please call our office to schedule the follow-up appointment.  Your physician recommends that you continue on your current medications as directed. Please refer to the Current Medication list given to you today.  

## 2012-06-18 NOTE — Assessment & Plan Note (Signed)
She has had her lipids followed by Dr. Chilton Si who checks them regularly per the patient.  Tolerates rosuvastatin quiet well.

## 2012-06-18 NOTE — Assessment & Plan Note (Signed)
She continues to do well. She should become more active as the weather warms up hopefully.  No new symptoms.

## 2012-06-21 ENCOUNTER — Other Ambulatory Visit: Payer: Self-pay | Admitting: *Deleted

## 2012-06-21 MED ORDER — PANTOPRAZOLE SODIUM 40 MG PO TBEC: 40.0000 mg | DELAYED_RELEASE_TABLET | Freq: Every day | ORAL | Status: DC

## 2012-06-27 ENCOUNTER — Other Ambulatory Visit: Payer: Self-pay | Admitting: Family Medicine

## 2012-06-27 NOTE — Telephone Encounter (Signed)
Last OV 12-21-10, last filled 07-18-10 #[id number]

## 2012-07-29 ENCOUNTER — Other Ambulatory Visit: Payer: Self-pay | Admitting: *Deleted

## 2012-07-29 MED ORDER — ROSUVASTATIN CALCIUM 20 MG PO TABS: 20.0000 mg | ORAL_TABLET | Freq: Every day | ORAL | Status: DC

## 2012-11-15 ENCOUNTER — Encounter (HOSPITAL_COMMUNITY): Payer: Self-pay | Admitting: Emergency Medicine

## 2012-11-15 ENCOUNTER — Emergency Department (HOSPITAL_COMMUNITY): Payer: Medicare Other

## 2012-11-15 ENCOUNTER — Observation Stay (HOSPITAL_COMMUNITY)
Admission: EM | Admit: 2012-11-15 | Discharge: 2012-11-17 | Disposition: A | Discharge status: Home / Self Care | Payer: Medicare Other | Attending: Cardiology | Admitting: Cardiology

## 2012-11-15 DIAGNOSIS — K219 Gastro-esophageal reflux disease without esophagitis: Secondary | ICD-10-CM | POA: Insufficient documentation

## 2012-11-15 DIAGNOSIS — R5383 Other fatigue: Secondary | ICD-10-CM | POA: Insufficient documentation

## 2012-11-15 DIAGNOSIS — I2510 Atherosclerotic heart disease of native coronary artery without angina pectoris: Secondary | ICD-10-CM | POA: Diagnosis present

## 2012-11-15 DIAGNOSIS — I200 Unstable angina: Principal | ICD-10-CM | POA: Diagnosis present

## 2012-11-15 DIAGNOSIS — I252 Old myocardial infarction: Secondary | ICD-10-CM | POA: Insufficient documentation

## 2012-11-15 DIAGNOSIS — J4489 Other specified chronic obstructive pulmonary disease: Secondary | ICD-10-CM | POA: Insufficient documentation

## 2012-11-15 DIAGNOSIS — I10 Essential (primary) hypertension: Secondary | ICD-10-CM | POA: Insufficient documentation

## 2012-11-15 DIAGNOSIS — R112 Nausea with vomiting, unspecified: Secondary | ICD-10-CM | POA: Insufficient documentation

## 2012-11-15 DIAGNOSIS — E785 Hyperlipidemia, unspecified: Secondary | ICD-10-CM | POA: Diagnosis present

## 2012-11-15 DIAGNOSIS — J449 Chronic obstructive pulmonary disease, unspecified: Secondary | ICD-10-CM

## 2012-11-15 DIAGNOSIS — R5381 Other malaise: Secondary | ICD-10-CM | POA: Insufficient documentation

## 2012-11-15 HISTORY — DX: Nausea with vomiting, unspecified: R11.2

## 2012-11-15 HISTORY — DX: Chronic obstructive pulmonary disease, unspecified: J44.9

## 2012-11-15 HISTORY — DX: Reserved for concepts with insufficient information to code with codable children: IMO0002

## 2012-11-15 HISTORY — DX: Ventricular fibrillation: I49.01

## 2012-11-15 HISTORY — DX: Hyperlipidemia, unspecified: E78.5

## 2012-11-15 HISTORY — DX: Fracture of one rib, unspecified side, initial encounter for closed fracture: S22.39XA

## 2012-11-15 HISTORY — DX: Essential (primary) hypertension: I10

## 2012-11-15 HISTORY — DX: Other specified postprocedural states: Z98.890

## 2012-11-15 HISTORY — DX: Ischemic cardiomyopathy: I25.5

## 2012-11-15 HISTORY — DX: Multiple fractures of ribs, unspecified side, initial encounter for closed fracture: S22.49XA

## 2012-11-15 LAB — COMPREHENSIVE METABOLIC PANEL
ALT: 18 U/L (ref 0–35)
AST: 30 U/L (ref 0–37)
Albumin: 3.8 g/dL (ref 3.5–5.2)
Alkaline Phosphatase: 96 U/L (ref 39–117)
BUN: 8 mg/dL (ref 6–23)
CO2: 27 mEq/L (ref 19–32)
Calcium: 9.7 mg/dL (ref 8.4–10.5)
Chloride: 95 mEq/L — ABNORMAL LOW (ref 96–112)
Creatinine, Ser: 0.55 mg/dL (ref 0.50–1.10)
GFR calc Af Amer: >90 mL/min (ref >90)
GFR calc non Af Amer: >90 mL/min (ref >90)
Glucose, Bld: 100 mg/dL — ABNORMAL HIGH (ref 70–99)
Potassium: 5 mEq/L (ref 3.5–5.1)
Sodium: 133 mEq/L — ABNORMAL LOW (ref 135–145)
Total Bilirubin: 0.5 mg/dL (ref 0.3–1.2)
Total Protein: 7.6 g/dL (ref 6.0–8.3)

## 2012-11-15 LAB — POCT I-STAT, CHEM 8
BUN: 8 mg/dL (ref 6–23)
Calcium, Ion: 1.17 mmol/L (ref 1.13–1.30)
Chloride: 98 mEq/L (ref 96–112)
Creatinine, Ser: 0.7 mg/dL (ref 0.50–1.10)
Glucose, Bld: 97 mg/dL (ref 70–99)
HCT: 47 % — ABNORMAL HIGH (ref 36.0–46.0)
Hemoglobin: 16 g/dL — ABNORMAL HIGH (ref 12.0–15.0)
Potassium: 4.9 mEq/L (ref 3.5–5.1)
Sodium: 133 mEq/L — ABNORMAL LOW (ref 135–145)
TCO2: 28 mmol/L (ref 0–100)

## 2012-11-15 LAB — HEPARIN LEVEL (UNFRACTIONATED): Heparin Unfractionated: 0.4 IU/mL (ref 0.30–0.70)

## 2012-11-15 LAB — POCT I-STAT TROPONIN I: Troponin i, poc: 0 ng/mL (ref 0.00–0.08)

## 2012-11-15 LAB — CBC WITH DIFFERENTIAL/PLATELET
Basophils Absolute: 0 10*3/uL (ref 0.0–0.1)
Basophils Relative: 0 % (ref 0–1)
Eosinophils Absolute: 0.1 10*3/uL (ref 0.0–0.7)
Eosinophils Relative: 1 % (ref 0–5)
HCT: 42.6 % (ref 36.0–46.0)
Hemoglobin: 15 g/dL (ref 12.0–15.0)
Lymphocytes Relative: 16 % (ref 12–46)
Lymphs Abs: 1.4 10*3/uL (ref 0.7–4.0)
MCH: 34.3 pg — ABNORMAL HIGH (ref 26.0–34.0)
MCHC: 35.2 g/dL (ref 30.0–36.0)
MCV: 97.5 fL (ref 78.0–100.0)
Monocytes Absolute: 0.6 10*3/uL (ref 0.1–1.0)
Monocytes Relative: 7 % (ref 3–12)
Neutro Abs: 6.4 10*3/uL (ref 1.7–7.7)
Neutrophils Relative %: 76 % (ref 43–77)
Platelets: 262 10*3/uL (ref 150–400)
RBC: 4.37 MIL/uL (ref 3.87–5.11)
RDW: 12.7 % (ref 11.5–15.5)
WBC: 8.4 10*3/uL (ref 4.0–10.5)

## 2012-11-15 MED ORDER — SODIUM CHLORIDE 0.9 % IV SOLN
1.0000 mL/kg/h | INTRAVENOUS | Status: DC
  Administered 2012-11-16: 1 mL/kg/h via INTRAVENOUS

## 2012-11-15 MED ORDER — ASPIRIN 81 MG PO TBEC: 81.0000 mg | DELAYED_RELEASE_TABLET | Freq: Every day | ORAL | Status: DC

## 2012-11-15 MED ORDER — MORPHINE SULFATE 4 MG/ML IJ SOLN
4.0000 mg | Freq: Once | INTRAMUSCULAR | Status: AC
  Administered 2012-11-15: 4 mg via INTRAVENOUS
  Filled 2012-11-15: qty 1

## 2012-11-15 MED ORDER — NITROGLYCERIN IN D5W 200-5 MCG/ML-% IV SOLN
5.0000 ug/min | INTRAVENOUS | Status: DC
  Administered 2012-11-15: 5 ug/min via INTRAVENOUS
  Filled 2012-11-15: qty 250

## 2012-11-15 MED ORDER — TRAMADOL HCL 50 MG PO TABS: 50.0000 mg | ORAL_TABLET | Freq: Four times a day (QID) | ORAL | Status: DC | PRN

## 2012-11-15 MED ORDER — ASPIRIN EC 81 MG PO TBEC
81.0000 mg | DELAYED_RELEASE_TABLET | Freq: Every day | ORAL | Status: DC
  Filled 2012-11-15 (×2): qty 1

## 2012-11-15 MED ORDER — SODIUM CHLORIDE 0.9 % IV SOLN
INTRAVENOUS | Status: DC
  Administered 2012-11-15: 10:00:00 via INTRAVENOUS

## 2012-11-15 MED ORDER — CALCIUM CARBONATE-VITAMIN D 600-400 MG-UNIT PO TABS: 1.0000 | ORAL_TABLET | Freq: Two times a day (BID) | ORAL | Status: DC

## 2012-11-15 MED ORDER — ASPIRIN 300 MG RE SUPP
300.0000 mg | RECTAL | Status: AC
  Filled 2012-11-15: qty 1

## 2012-11-15 MED ORDER — HEPARIN (PORCINE) IN NACL 100-0.45 UNIT/ML-% IJ SOLN
900.0000 [IU]/h | INTRAMUSCULAR | Status: DC
  Administered 2012-11-15: 800 [IU]/h via INTRAVENOUS
  Administered 2012-11-16: 900 [IU]/h via INTRAVENOUS
  Filled 2012-11-15 (×4): qty 250

## 2012-11-15 MED ORDER — NITROGLYCERIN 0.4 MG SL SUBL: 0.4000 mg | SUBLINGUAL_TABLET | SUBLINGUAL | Status: DC | PRN

## 2012-11-15 MED ORDER — SODIUM CHLORIDE 0.9 % IV SOLN: 250.0000 mL | INTRAVENOUS | Status: DC | PRN

## 2012-11-15 MED ORDER — ASPIRIN EC 81 MG PO TBEC
81.0000 mg | DELAYED_RELEASE_TABLET | Freq: Every day | ORAL | Status: DC
  Administered 2012-11-15 – 2012-11-17 (×2): 81 mg via ORAL
  Filled 2012-11-15 (×3): qty 1

## 2012-11-15 MED ORDER — ASPIRIN 81 MG PO CHEW
324.0000 mg | CHEWABLE_TABLET | ORAL | Status: AC
  Administered 2012-11-15: 324 mg via ORAL
  Filled 2012-11-15: qty 4

## 2012-11-15 MED ORDER — MOMETASONE FURO-FORMOTEROL FUM 100-5 MCG/ACT IN AERO
2.0000 | INHALATION_SPRAY | Freq: Two times a day (BID) | RESPIRATORY_TRACT | Status: DC
  Administered 2012-11-16 – 2012-11-17 (×3): 2 via RESPIRATORY_TRACT
  Filled 2012-11-15: qty 8.8

## 2012-11-15 MED ORDER — CALCIUM CARBONATE-VITAMIN D 500-200 MG-UNIT PO TABS
1.0000 | ORAL_TABLET | Freq: Two times a day (BID) | ORAL | Status: DC
  Administered 2012-11-15 – 2012-11-17 (×4): 1 via ORAL
  Filled 2012-11-15 (×5): qty 1

## 2012-11-15 MED ORDER — ATORVASTATIN CALCIUM 80 MG PO TABS
80.0000 mg | ORAL_TABLET | Freq: Every day | ORAL | Status: DC
  Filled 2012-11-15 (×2): qty 1

## 2012-11-15 MED ORDER — METOPROLOL SUCCINATE ER 25 MG PO TB24
25.0000 mg | ORAL_TABLET | Freq: Every day | ORAL | Status: DC
  Administered 2012-11-16 – 2012-11-17 (×2): 25 mg via ORAL
  Filled 2012-11-15 (×2): qty 1

## 2012-11-15 MED ORDER — PANTOPRAZOLE SODIUM 40 MG PO TBEC
40.0000 mg | DELAYED_RELEASE_TABLET | Freq: Every day | ORAL | Status: DC
  Administered 2012-11-15 – 2012-11-17 (×3): 40 mg via ORAL
  Filled 2012-11-15 (×3): qty 1

## 2012-11-15 MED ORDER — SODIUM CHLORIDE 0.9 % IJ SOLN
3.0000 mL | Freq: Two times a day (BID) | INTRAMUSCULAR | Status: DC
  Administered 2012-11-15 – 2012-11-16 (×2): 3 mL via INTRAVENOUS

## 2012-11-15 MED ORDER — HEPARIN BOLUS VIA INFUSION
4000.0000 [IU] | Freq: Once | INTRAVENOUS | Status: AC
  Administered 2012-11-15: 4000 [IU] via INTRAVENOUS

## 2012-11-15 MED ORDER — ASPIRIN 81 MG PO CHEW
324.0000 mg | CHEWABLE_TABLET | Freq: Once | ORAL | Status: AC
  Administered 2012-11-15: 324 mg via ORAL
  Filled 2012-11-15: qty 4

## 2012-11-15 MED ORDER — ONDANSETRON HCL 4 MG/2ML IJ SOLN: 4.0000 mg | Freq: Four times a day (QID) | INTRAMUSCULAR | Status: DC | PRN

## 2012-11-15 MED ORDER — NITROGLYCERIN 0.4 MG SL SUBL
0.4000 mg | SUBLINGUAL_TABLET | Freq: Once | SUBLINGUAL | Status: AC
  Administered 2012-11-15: 0.4 mg via SUBLINGUAL
  Filled 2012-11-15: qty 25

## 2012-11-15 MED ORDER — ONDANSETRON HCL 4 MG/2ML IJ SOLN: 4.0000 mg | Freq: Once | INTRAMUSCULAR | Status: DC

## 2012-11-15 MED ORDER — SODIUM CHLORIDE 0.9 % IJ SOLN: 3.0000 mL | INTRAMUSCULAR | Status: DC | PRN

## 2012-11-15 MED ORDER — ASPIRIN 81 MG PO CHEW
324.0000 mg | CHEWABLE_TABLET | ORAL | Status: AC
  Administered 2012-11-16: 324 mg via ORAL
  Filled 2012-11-15: qty 4

## 2012-11-15 MED ORDER — ACETAMINOPHEN 325 MG PO TABS: 650.0000 mg | ORAL_TABLET | ORAL | Status: DC | PRN

## 2012-11-15 NOTE — H&P (Signed)
 ADMISSION HISTORY AND PHYSICAL   Date: 11/15/2012               Patient Name:  [name] MRN: [id number]  DOB: 02/26/1941 Age / Sex: 72 y.o., female        PCP: GREEN, [name] Primary Cardiologist: Dalton McLean, MD         History of Present Illness: Patient is a 72 y.o. 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  She has been very dyspneac for the past several days.    Pt is having lots of symptoms of exertional dyspnea.  Climbing 1 flight of stairs causes her to be completely winded.   She was scheduled to see Dr. Jordan this week.   This am she went to breakfast but could not eat anything -ate only 1 bite..  She became dizzy,  Had some coffee.  Her friend called EMS.  She ran out of her prescription of advair about a week ago.  She has seen Dr. Green recently who suggested that she see the cardiologist .    She does some water aerobics for the past several weeks.  She stopped the water aerobics after she had severe dyspnea several days ago.  She also has severe chest heaviness.   Medications: Outpatient medications:  (Not in a hospital admission)  Allergies  Allergen Reactions  . Codeine Nausea And Vomiting  . Sulfonamide Derivatives Nausea And Vomiting     Past Medical History  Diagnosis Date  . Coronary artery disease 08-14-09    status post recent ventricular fibrillation arrest and anterior myocardial infaction with bare-metal stenting of the left anterior   descending cornonary artery  . Hx of heart artery stent     LAD, dCFX 20-30%; RI 70% (small vessel) ..med  Rx;;  normal LVF Echo 10/2009; EF55-60%; ank; Grade 1 diast dysfx; mild LAE  . GERD (gastroesophageal reflux disease)   . Osteoarthritis   . Osteopenia   . Fracture     left sided second and sixth rib   . Compression fracture      T9  . PONV (postoperative nausea and vomiting)     Past Surgical History  Procedure Laterality Date  . Tonsillectomy    . Appendectomy    . Orif hip fracture    . Leg surgery      R low  . Back surgery      History reviewed. No pertinent family history.  Social History:  reports that she has quit smoking. She has never used smokeless tobacco. She reports that  drinks alcohol. She reports that she does not use illicit drugs.   Review of Systems: Constitutional:  denies fever, chills, diaphoresis, appetite change and fatigue.  HEENT: denies photophobia, eye pain, redness, hearing loss, ear pain, congestion, sore throat, rhinorrhea, sneezing, neck pain, neck stiffness and tinnitus.  Respiratory: admits to SOB, DOE, cough, chest tightness, and wheezing.  Cardiovascular: denies  , palpitations and leg swelling.  Gastrointestinal: admits to nausea, vomiting, abdominal pain, diarrhea, constipation, blood in stool.  She has frequent episodes of nausea / stomach pain  after eating.   Genitourinary: denies dysuria, urgency, frequency, hematuria, flank pain and difficulty urinating.  Musculoskeletal: denies  myalgias, back pain, joint swelling, arthralgias and gait problem.   She had some neck and shoulder ache today.   Skin: denies   pallor, rash and wound.  Neurological: denies dizziness, seizures, syncope, weakness, light-headedness, numbness and headaches.   Hematological: denies adenopathy, easy bruising, personal or family bleeding history.  Psychiatric/ Behavioral: denies suicidal ideation, mood changes, confusion, nervousness, sleep disturbance and agitation.    Physical Exam: BP 139/82  Pulse 60  Temp(Src) 97.4 F (36.3 C) (Oral)  Resp 15  Wt 163 lb 1 oz (73.965 kg)  BMI 26.71 kg/m2  SpO2 92%  General: Vital signs reviewed and noted. Well-developed, well-nourished, in no acute distress; alert, appropriate and cooperative throughout examination.  Head: Normocephalic,  atraumatic, sclera anicteric, mucus membranes are moist  Neck: Supple. Negative for carotid bruits. JVD not elevated.  Lungs:  Clear bilaterally to auscultation without wheezes, rales, or rhonchi. Breathing is unlabored.  Heart: RR with S1 S2. No murmurs, rubs, or gallops appreciated.  Abdomen:  Soft, non-tender, non-distended with normoactive bowel sounds. No hepatomegaly. No rebound/guarding. No obvious abdominal masses  MSK: Strength and the appear normal for age.  Extremities: No clubbing or cyanosis. No edema.  Distal pedal pulses are 2+ and equal bilaterally.  Neurologic: Alert and oriented X 3. Moves all extremities spontaneously  Psych:  Responds to questions appropriately with a normal affect.    Lab results: Basic Metabolic Panel:  Recent Labs Lab 11/15/12 1030 11/15/12 1042  NA 133* 133*  K 5.0 4.9  CL 95* 98  CO2 27  --   GLUCOSE 100* 97  BUN 8 8  CREATININE 0.55 0.70  CALCIUM 9.7  --     Liver Function Tests:  Recent Labs Lab 11/15/12 1030  AST 30  ALT 18  ALKPHOS 96  BILITOT 0.5  PROT 7.6  ALBUMIN 3.8   No results found for this basename: LIPASE, AMYLASE,  in the last 168 hours  CBC:  Recent Labs Lab 11/15/12 1030 11/15/12 1042  WBC 8.4  --   NEUTROABS 6.4  --   HGB 15.0 16.0*  HCT 42.6 47.0*  MCV 97.5  --   PLT 262  --     Cardiac Enzymes: No results found for this basename: CKTOTAL, CKMB, CKMBINDEX, TROPONINI,  in the last 168 hours  BNP: No components found with this basename: POCBNP,   CBG: No results found for this basename: GLUCAP,  in the last 168 hours  Coagulation Studies: No results found for this basename: LABPROT, INR,  in the last 72 hours   Other results:  EKG :  NSR , she has no ST or T wave changes.   Imaging: Dg Chest Port 1 View  11/15/2012   *RADIOLOGY REPORT*  Clinical Data: Chest pain.  Shortness of breath.  Prior MI and 2011.  Current history of emphysema.  PORTABLE CHEST - 1 VIEW  Comparison: CTA chest  03/05/2012.  Portable chest x-rays 03/04/2012, 04/13/2010, 04/10/2010.  Two-view chest x-ray 08/22/2009.  Findings: Cardiac silhouette mildly enlarged but stable.  Thoracic aorta mildly tortuous and atherosclerotic, unchanged.  Hilar and mediastinal contours otherwise unremarkable.  Prominent bronchovascular markings diffusely and moderate central peribronchial thickening, more so than on the prior examinations. Suboptimal inspiration accounts for crowded bronchovascular markings at the bases.  Possible nodule in the right upper lobe, not visualized on the prior examinations.  Lungs otherwise clear.  IMPRESSION:  1.  Suboptimal inspiration.  Moderate changes of acute bronchitis and/or asthma superimposed upon COPD/emphysema. 2.  Possible right upper lobe lung nodule.  Non-emergent CT of the chest, with contrast if possible, may be confirmatory. 3.  Stable cardiomegaly without pulmonary   edema.   Original Report Authenticated By: Thomas Lawrence, M.D.       Assessment & Plan:  1. Unstable angina: The patient's symptoms are consistent with possible unstable angina. She also has other possible explanations including a GI issues and anxiety. She apparently has problems with anxiety and "nervous stomach".  She's had exertional symptoms for the past several days and this is worrisome for unstable. She has a history of an anterior wall MI with a ventricular fibrillation arrest in the past.  She's not in any pain at this point. Her troponin levels are normal.  At this point we will admit her to [facility]. We'll check troponin levels every 8 hours x3. We will see her in the morning and decide whether or not she needs a cardiac catheterization versus a stress Myoview. She had a stress Myoview in December, 2013. She had significant symptoms during the stress test but no EKG echo mouth these and no scintigraphic abnormalities to suggest ischemia.  We'll encourage her to ambulate in the halls.  DVT PPX  - Lovenox.   Anessa Charley J. Jazon Jipson, Jr., MD, FACC 11/15/2012, 3:42 PM       

## 2012-11-15 NOTE — ED Notes (Signed)
Per EMS: nausea started around 0700 only vomited once none since. Went out to eat for breakfast was called out to [employer]. C/o right side neck pain and shoulder pain no chest pain.

## 2012-11-15 NOTE — ED Provider Notes (Signed)
CSN: [id number]     Arrival date & time 11/15/12  1004 History     First MD Initiated Contact with Patient 11/15/12 1011     Chief Complaint  Patient presents with  . Nausea  . Emesis  . Weakness   (Consider location/radiation/quality/duration/timing/severity/associated sxs/prior Treatment) Patient is a 72 y.o. female presenting with vomiting and weakness. The history is provided by the patient.  Emesis Severity:  Mild Weakness Associated symptoms include chest pain.   patient's had 2 episodes of nausea with vomiting. She also states that she felt lightheaded. She's had pain in her bilateral neck and pressure her chest. She states this feels somewhat like her previous heart problems. She states over the last 2 weeks she's been able to do 6 colectomy that she could do previously. States she can no longer go up 2 flights of stairs. She states she gets chest pain when she tries. No diarrhea. No abdominal pain. No headache. No confusion. She sees [facility] cardiology.  Past Medical History  Diagnosis Date  . Coronary artery disease 08-14-09    status post recent ventricular fibrillation arrest and anterior myocardial infaction with bare-metal stenting of the left anterior   descending cornonary artery  . Hx of heart artery stent     LAD, dCFX 20-30%; RI 70% (small vessel) ..med  Rx;;  normal LVF Echo 10/2009; EF55-60%; ank; Grade 1 diast dysfx; mild LAE  . GERD (gastroesophageal reflux disease)   . Osteoarthritis   . Osteopenia   . Fracture     left sided second and sixth rib   . Compression fracture     T9  . PONV (postoperative nausea and vomiting)    Past Surgical History  Procedure Laterality Date  . Tonsillectomy    . Appendectomy    . Orif hip fracture    . Leg surgery      R low  . Back surgery     History reviewed. No pertinent family history. History  Substance Use Topics  . Smoking status: Former Smoker -- 20 years  . Smokeless tobacco: Never Used  . Alcohol Use:  Yes     Comment: No history of alcohol abuse   OB History   Grav Para Term Preterm Abortions TAB SAB Ect Mult Living                 Review of Systems  HENT: Positive for neck pain.   Respiratory: Negative for chest tightness.   Cardiovascular: Positive for chest pain.  Gastrointestinal: Positive for nausea and vomiting.  Genitourinary: Negative for difficulty urinating.  Musculoskeletal: Negative for back pain.  Neurological: Positive for weakness.  Psychiatric/Behavioral: Negative for confusion.    Allergies  Codeine and Sulfonamide derivatives  Home Medications   Current Outpatient Rx  Name  Route  Sig  Dispense  Refill  . aspirin 81 MG EC tablet   Oral   Take 81 mg by mouth daily.           . Calcium Carbonate-Vitamin D 600-400 MG-UNIT per tablet   Oral   Take 1 tablet by mouth 2 (two) times daily.          . diclofenac sodium (VOLTAREN) 1 % GEL   Topical   Apply 2 g topically daily as needed. For pain in hands         . Fluticasone-Salmeterol (ADVAIR DISKUS) 250-50 MCG/DOSE AEPB   Inhalation   Inhale 1 puff into the lungs 2 (two) times daily.  180 each   2   . metoprolol succinate (TOPROL-XL) 25 MG 24 hr tablet   Oral   Take 1 tablet (25 mg total) by mouth daily.   90 tablet   3   . Multiple Vitamins-Minerals (QC WOMENS DAILY MULTIVITAMIN) TABS   Oral   Take by mouth daily. with iron          . nitroGLYCERIN (NITROSTAT) 0.4 MG SL tablet   Sublingual   Place 0.4 mg under the tongue every 5 (five) minutes as needed. For chest pain         . pantoprazole (PROTONIX) 40 MG tablet   Oral   Take 1 tablet (40 mg total) by mouth daily.   90 tablet   3   . rosuvastatin (CRESTOR) 20 MG tablet   Oral   Take 1 tablet (20 mg total) by mouth at bedtime.   90 tablet   3   . Teriparatide, Recombinant, (FORTEO [place])   Subcutaneous   Inject into the skin at bedtime.          . traMADol (ULTRAM) 50 MG tablet   Oral   Take 1 tablet (50 mg total)  by mouth every 6 (six) hours as needed for pain.   20 tablet   0    BP 149/79  Pulse 63  Temp(Src) 97.4 F (36.3 C) (Oral)  Resp 15  Wt 163 lb 1 oz (73.965 kg)  BMI 26.71 kg/m2  SpO2 95% Physical Exam  Nursing note and vitals reviewed. Constitutional: She is oriented to person, place, and time. She appears well-developed and well-nourished.  HENT:  Head: Normocephalic and atraumatic.  Eyes: EOM are normal. Pupils are equal, round, and reactive to light.  Neck: Normal range of motion. Neck supple.  Cardiovascular: Normal rate, regular rhythm and normal heart sounds.   No murmur heard. Pulmonary/Chest: Effort normal and breath sounds normal. No respiratory distress. She has no wheezes. She has no rales.  Abdominal: Soft. Bowel sounds are normal. She exhibits no distension. There is no tenderness. There is no rebound and no guarding.  Musculoskeletal: Normal range of motion.  Neurological: She is alert and oriented to person, place, and time. No cranial nerve deficit.  Skin: Skin is warm and dry.  Psychiatric: She has a normal mood and affect. Her speech is normal.    ED Course   Procedures (including critical care time)  Labs Reviewed  CBC WITH DIFFERENTIAL - Abnormal; Notable for the following:    MCH 34.3 (*)    All other components within normal limits  COMPREHENSIVE METABOLIC PANEL - Abnormal; Notable for the following:    Sodium 133 (*)    Chloride 95 (*)    Glucose, Bld 100 (*)    All other components within normal limits  POCT I-STAT, CHEM 8 - Abnormal; Notable for the following:    Sodium 133 (*)    Hemoglobin 16.0 (*)    HCT 47.0 (*)    All other components within normal limits  HEPARIN LEVEL (UNFRACTIONATED)  POCT I-STAT TROPONIN I   Dg Chest Port 1 View  11/15/2012   *RADIOLOGY REPORT*  Clinical Data: Chest pain.  Shortness of breath.  Prior MI and 2011.  Current history of emphysema.  PORTABLE CHEST - 1 VIEW  Comparison: CTA chest 03/05/2012.  Portable  chest x-rays 03/04/2012, 04/13/2010, 04/10/2010.  Two-view chest x-ray 08/22/2009.  Findings: Cardiac silhouette mildly enlarged but stable.  Thoracic aorta mildly tortuous and atherosclerotic, unchanged.  Hilar  and mediastinal contours otherwise unremarkable.  Prominent bronchovascular markings diffusely and moderate central peribronchial thickening, more so than on the prior examinations. Suboptimal inspiration accounts for crowded bronchovascular markings at the bases.  Possible nodule in the right upper lobe, not visualized on the prior examinations.  Lungs otherwise clear.  IMPRESSION:  1.  Suboptimal inspiration.  Moderate changes of acute bronchitis and/or asthma superimposed upon COPD/emphysema. 2.  Possible right upper lobe lung nodule.  Non-emergent CT of the chest, with contrast if possible, may be confirmatory. 3.  Stable cardiomegaly without pulmonary edema.   Original Report Authenticated By: Hulan Saas, M.D.   1. Unstable angina      Date: 11/15/2012 1011  Rate: 62  Rhythm: normal sinus rhythm  QRS Axis: normal  Intervals: normal  ST/T Wave abnormalities: nonspecific T wave changes  Conduction Disutrbances:none  Narrative Interpretation:   Old EKG Reviewed: unchanged   Date: 11/15/2012  Rate: 59  Rhythm: normal sinus rhythm  QRS Axis: normal  Intervals: normal  ST/T Wave abnormalities: nonspecific T wave changes  Conduction Disutrbances:none  Narrative Interpretation:   Old EKG Reviewed: unchanged  CRITICAL CARE Performed by: Billee Cashing Total critical care time30 Critical care time was exclusive of separately billable procedures and treating other patients. Critical care was necessary to treat or prevent imminent or life-threatening deterioration. Critical care was time spent personally by me on the following activities: development of treatment plan with patient and/or surrogate as well as nursing, discussions with consultants, evaluation of patient's  response to treatment, examination of patient, obtaining history from patient or surrogate, ordering and performing treatments and interventions, ordering and review of laboratory studies, ordering and review of radiographic studies, pulse oximetry and re-evaluation of patient's condition.  MDM  Patient presents with chest pain and nausea with vomiting. Chest pressure goes to her neck involves her right chest. She feels somewhat like her previous angina. She has had a decrease in her exercise tolerance. She is known coronary disease. Patient is required heparin, nitroglycerin, and morphine for pain control. Will be admitted to our cardiology transferred to [facility]. She likely received a cardiac cath  [facility]. Rubin Payor, MD 11/15/12 [phone]

## 2012-11-15 NOTE — Consult Note (Addendum)
ADMISSION HISTORY AND PHYSICAL   Date: 11/15/2012               Patient Name:  [name] MRN: [id number]  DOB: Dec 18, 1940 Age / Sex: 72 y.o., female        PCP: Enrique Sack Primary Cardiologist: Marca Ancona, MD         History of Present Illness: Patient is a 72 y.o. 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  She has been very dyspneac for the past several days.    Pt is having lots of symptoms of exertional dyspnea.  Climbing 1 flight of stairs causes her to be completely winded.   She was scheduled to see Dr. [place] this week.   This am she went to breakfast but could not eat anything -ate only 1 bite..  She became dizzy,  Had some coffee.  Her friend called EMS.  She ran out of her prescription of advair about a week ago.  She has seen Dr. Chilton Si recently who suggested that she see the cardiologist .    She does some water aerobics for the past several weeks.  She stopped the water aerobics after she had severe dyspnea several days ago.  She also has severe chest heaviness.   Medications: Outpatient medications:  (Not in a hospital admission)  Allergies  Allergen Reactions  . Codeine Nausea And Vomiting  . Sulfonamide Derivatives Nausea And Vomiting     Past Medical History  Diagnosis Date  . Coronary artery disease 08-14-09    status post recent ventricular fibrillation arrest and anterior myocardial infaction with bare-metal stenting of the left anterior   descending cornonary artery  . Hx of heart artery stent     LAD, dCFX 20-30%; RI 70% (small vessel) ..med  Rx;;  normal LVF Echo 10/2009; EF55-60%; ank; Grade 1 diast dysfx; mild LAE  . GERD (gastroesophageal reflux disease)   . Osteoarthritis   . Osteopenia   . Fracture     left sided second and sixth rib   . Compression fracture      T9  . PONV (postoperative nausea and vomiting)     Past Surgical History  Procedure Laterality Date  . Tonsillectomy    . Appendectomy    . Orif hip fracture    . Leg surgery      R low  . Back surgery      History reviewed. No pertinent family history.  Social History:  reports that she has quit smoking. She has never used smokeless tobacco. She reports that  drinks alcohol. She reports that she does not use illicit drugs.   Review of Systems: Constitutional:  denies fever, chills, diaphoresis, appetite change and fatigue.  HEENT: denies photophobia, eye pain, redness, hearing loss, ear pain, congestion, sore throat, rhinorrhea, sneezing, neck pain, neck stiffness and tinnitus.  Respiratory: admits to SOB, DOE, cough, chest tightness, and wheezing.  Cardiovascular: denies  , palpitations and leg swelling.  Gastrointestinal: admits to nausea, vomiting, abdominal pain, diarrhea, constipation, blood in stool.  She has frequent episodes of nausea / stomach pain  after eating.   Genitourinary: denies dysuria, urgency, frequency, hematuria, flank pain and difficulty urinating.  Musculoskeletal: denies  myalgias, back pain, joint swelling, arthralgias and gait problem.   She had some neck and shoulder ache today.   Skin: denies  pallor, rash and wound.  Neurological: denies dizziness, seizures, syncope, weakness, light-headedness, numbness and headaches.   Hematological: denies adenopathy, easy bruising, personal or family bleeding history.  Psychiatric/ Behavioral: denies suicidal ideation, mood changes, confusion, nervousness, sleep disturbance and agitation.    Physical Exam: BP 139/82  Pulse 60  Temp(Src) 97.4 F (36.3 C) (Oral)  Resp 15  Wt 163 lb 1 oz (73.965 kg)  BMI 26.71 kg/m2  SpO2 92%  General: Vital signs reviewed and noted. Well-developed, well-nourished, in no acute distress; alert, appropriate and cooperative throughout examination.  Head: Normocephalic,  atraumatic, sclera anicteric, mucus membranes are moist  Neck: Supple. Negative for carotid bruits. JVD not elevated.  Lungs:  Clear bilaterally to auscultation without wheezes, rales, or rhonchi. Breathing is unlabored.  Heart: RR with S1 S2. No murmurs, rubs, or gallops appreciated.  Abdomen:  Soft, non-tender, non-distended with normoactive bowel sounds. No hepatomegaly. No rebound/guarding. No obvious abdominal masses  MSK: Strength and the appear normal for age.  Extremities: No clubbing or cyanosis. No edema.  Distal pedal pulses are 2+ and equal bilaterally.  Neurologic: Alert and oriented X 3. Moves all extremities spontaneously  Psych:  Responds to questions appropriately with a normal affect.    Lab results: Basic Metabolic Panel:  Recent Labs Lab 11/15/12 1030 11/15/12 1042  NA 133* 133*  K 5.0 4.9  CL 95* 98  CO2 27  --   GLUCOSE 100* 97  BUN 8 8  CREATININE 0.55 0.70  CALCIUM 9.7  --     Liver Function Tests:  Recent Labs Lab 11/15/12 1030  AST 30  ALT 18  ALKPHOS 96  BILITOT 0.5  PROT 7.6  ALBUMIN 3.8   No results found for this basename: LIPASE, AMYLASE,  in the last 168 hours  CBC:  Recent Labs Lab 11/15/12 1030 11/15/12 1042  WBC 8.4  --   NEUTROABS 6.4  --   HGB 15.0 16.0*  HCT 42.6 47.0*  MCV 97.5  --   PLT 262  --     Cardiac Enzymes: No results found for this basename: CKTOTAL, CKMB, CKMBINDEX, TROPONINI,  in the last 168 hours  BNP: No components found with this basename: POCBNP,   CBG: No results found for this basename: GLUCAP,  in the last 168 hours  Coagulation Studies: No results found for this basename: LABPROT, INR,  in the last 72 hours   Other results:  EKG :  NSR , she has no ST or T wave changes.   Imaging: Dg Chest Port 1 View  11/15/2012   *RADIOLOGY REPORT*  Clinical Data: Chest pain.  Shortness of breath.  Prior MI and 2011.  Current history of emphysema.  PORTABLE CHEST - 1 VIEW  Comparison: CTA chest  03/05/2012.  Portable chest x-rays 03/04/2012, 04/13/2010, 04/10/2010.  Two-view chest x-ray 08/22/2009.  Findings: Cardiac silhouette mildly enlarged but stable.  Thoracic aorta mildly tortuous and atherosclerotic, unchanged.  Hilar and mediastinal contours otherwise unremarkable.  Prominent bronchovascular markings diffusely and moderate central peribronchial thickening, more so than on the prior examinations. Suboptimal inspiration accounts for crowded bronchovascular markings at the bases.  Possible nodule in the right upper lobe, not visualized on the prior examinations.  Lungs otherwise clear.  IMPRESSION:  1.  Suboptimal inspiration.  Moderate changes of acute bronchitis and/or asthma superimposed upon COPD/emphysema. 2.  Possible right upper lobe lung nodule.  Non-emergent CT of the chest, with contrast if possible, may be confirmatory. 3.  Stable cardiomegaly without pulmonary  edema.   Original Report Authenticated By: Hulan Saas, M.D.       Assessment & Plan:  1. Unstable angina: The patient's symptoms are consistent with possible unstable angina. She also has other possible explanations including a GI issues and anxiety. She apparently has problems with anxiety and "nervous stomach".  She's had exertional symptoms for the past several days and this is worrisome for unstable. She has a history of an anterior wall MI with a ventricular fibrillation arrest in the past.  She's not in any pain at this point. Her troponin levels are normal.  At this point we will admit her to [facility]. [facility] check troponin levels every 8 hours x3. We will see her in the morning and decide whether or not she needs a cardiac catheterization versus a stress Myoview. She had a stress Myoview in December, 2013. She had significant symptoms during the stress test but no EKG echo mouth these and no scintigraphic abnormalities to suggest ischemia.  We'll encourage her to ambulate in the halls.  DVT PPX  - Lovenox.   Vesta Mixer, Montez Hageman., MD, [facility] 11/15/2012, 3:42 PM

## 2012-11-15 NOTE — ED Notes (Signed)
ZOX:WRUE<AV> Expected date:<BR> Expected time:<BR> Means of arrival:<BR> Comments:<BR> 72yo- vomiting

## 2012-11-15 NOTE — Progress Notes (Signed)
ANTICOAGULATION CONSULT NOTE - Follow up  Pharmacy Consult for Heparin Indication: chest pain/ACS  Allergies  Allergen Reactions  . Codeine Nausea And Vomiting  . Sulfonamide Derivatives Nausea And Vomiting    Patient Measurements: Weight: 163 lb 1 oz (73.965 kg) Heparin Dosing Weight: 72 kg  Vital Signs: Temp: 98.6 F (37 C) (08/12 1800) Temp src: Oral (08/12 1011) BP: 148/69 mmHg (08/12 1800) Pulse Rate: 60 (08/12 1730)  Labs:  Recent Labs  11/15/12 1030 11/15/12 1042 11/15/12 1825  HGB 15.0 16.0*  --   HCT 42.6 47.0*  --   PLT 262  --   --   HEPARINUNFRC  --   --  0.40  CREATININE 0.55 0.70  --    The CrCl is unknown because both a height and weight (above a minimum accepted value) are required for this calculation.  Medical History: Past Medical History  Diagnosis Date  . Coronary artery disease 08-14-09    status post recent ventricular fibrillation arrest and anterior myocardial infaction with bare-metal stenting of the left anterior   descending cornonary artery  . Hx of heart artery stent     LAD, dCFX 20-30%; RI 70% (small vessel) ..med  Rx;;  normal LVF Echo 10/2009; EF55-60%; ank; Grade 1 diast dysfx; mild LAE  . GERD (gastroesophageal reflux disease)   . Osteoarthritis   . Osteopenia   . Fracture     left sided second and sixth rib   . Compression fracture     T9  . PONV (postoperative nausea and vomiting)    Medications:  Infusions:  . sodium chloride Stopped (11/15/12 1758)  . heparin 800 Units/hr (11/15/12 1144)  . nitroGLYCERIN Stopped (11/15/12 1758)   Assessment: 72 yoF with neck and shoulder pain, no chest pain. Hx of MI 2011,stent placement.  Heparin started in ER, 4000 unit bolus and infusion at 800 units/hour  Initial heparin level @ 1825 is therapeutic (0.4)  No bleeding or other issues per RN  Transfer to [facility] pending  Goal of Therapy:  Heparin level 0.3-0.7 units/ml Monitor platelets by anticoagulation protocol: Yes    Plan:   Continue heparin at 800 units/hr  Confirmation level in 6 hours  Daily Heparin level, CBC while on Heparin  Gwen Her PharmD  [phone] 11/15/2012 7:27 PM

## 2012-11-15 NOTE — Progress Notes (Signed)
ANTICOAGULATION CONSULT NOTE - Initial Consult  Pharmacy Consult for Heparin Indication: chest pain/ACS  Allergies  Allergen Reactions  . Codeine Nausea And Vomiting  . Sulfonamide Derivatives Nausea And Vomiting    Patient Measurements:   Heparin Dosing Weight: 72 kg  Vital Signs: Temp: 97.4 F (36.3 C) (08/12 1011) Temp src: Oral (08/12 1011) BP: 160/74 mmHg (08/12 1011) Pulse Rate: 60 (08/12 1011)  Labs:  Recent Labs  11/15/12 1030 11/15/12 1042  HGB 15.0 16.0*  HCT 42.6 47.0*  PLT 262  --   CREATININE  --  0.70   The CrCl is unknown because both a height and weight (above a minimum accepted value) are required for this calculation.  Medical History: Past Medical History  Diagnosis Date  . Coronary artery disease 08-14-09    status post recent ventricular fibrillation arrest and anterior myocardial infaction with bare-metal stenting of the left anterior   descending cornonary artery  . Hx of heart artery stent     LAD, dCFX 20-30%; RI 70% (small vessel) ..med  Rx;;  normal LVF Echo 10/2009; EF55-60%; ank; Grade 1 diast dysfx; mild LAE  . GERD (gastroesophageal reflux disease)   . Osteoarthritis   . Osteopenia   . Fracture     left sided second and sixth rib   . Compression fracture     T9   Medications:  Infusions:  . sodium chloride 125 mL/hr at 11/15/12 1028  . heparin    . heparin    . nitroGLYCERIN     Assessment: 56 yoF with neck and shoulder pain, no chest pain. Hx of MI 2011,stent placement. Heparin infusion ordered, EKG, NTG infusion.  Heparin to begin in ED  Goal of Therapy:  Heparin level 0.3-0.7 units/ml Monitor platelets by anticoagulation protocol: Yes   Plan:   Heparin 4000 unit bolus, infusion at 800 units/hr  Check first Heparin level in 6 hr  Daily Heparin level, CBC while on Heparin  Otho Bellows PharmD Pager [phone] 11/15/2012, 11:04 AM

## 2012-11-16 ENCOUNTER — Encounter (HOSPITAL_COMMUNITY)
Admission: EM | Disposition: A | Discharge status: Home / Self Care | Payer: Self-pay | Source: Home / Self Care | Attending: Emergency Medicine

## 2012-11-16 DIAGNOSIS — I200 Unstable angina: Secondary | ICD-10-CM | POA: Diagnosis present

## 2012-11-16 DIAGNOSIS — I2510 Atherosclerotic heart disease of native coronary artery without angina pectoris: Secondary | ICD-10-CM

## 2012-11-16 HISTORY — PX: LEFT HEART CATHETERIZATION WITH CORONARY ANGIOGRAM: SHX5451

## 2012-11-16 LAB — CBC
HCT: 38.7 % (ref 36.0–46.0)
Hemoglobin: 13.4 g/dL (ref 12.0–15.0)
MCH: 33.7 pg (ref 26.0–34.0)
MCHC: 34.6 g/dL (ref 30.0–36.0)
MCV: 97.2 fL (ref 78.0–100.0)
Platelets: 228 10*3/uL (ref 150–400)
RBC: 3.98 MIL/uL (ref 3.87–5.11)
RDW: 12.8 % (ref 11.5–15.5)
WBC: 4.8 10*3/uL (ref 4.0–10.5)

## 2012-11-16 LAB — TROPONIN I
Troponin I: <0.3 ng/mL (ref <0.30)
Troponin I: <0.3 ng/mL (ref <0.30)
Troponin I: <0.3 ng/mL (ref <0.30)

## 2012-11-16 LAB — PROTIME-INR
INR: 1.05 (ref 0.00–1.49)
Prothrombin Time: 13.5 seconds (ref 11.6–15.2)

## 2012-11-16 LAB — TSH: TSH: 2.655 u[IU]/mL (ref 0.350–4.500)

## 2012-11-16 LAB — HEPARIN LEVEL (UNFRACTIONATED): Heparin Unfractionated: 0.2 IU/mL — ABNORMAL LOW (ref 0.30–0.70)

## 2012-11-16 SURGERY — LEFT HEART CATHETERIZATION WITH CORONARY ANGIOGRAM
Anesthesia: LOCAL

## 2012-11-16 MED ORDER — HEPARIN (PORCINE) IN NACL 2-0.9 UNIT/ML-% IJ SOLN
INTRAMUSCULAR | Status: AC
  Filled 2012-11-16: qty 1000

## 2012-11-16 MED ORDER — SODIUM CHLORIDE 0.9 % IV SOLN
INTRAVENOUS | Status: AC
  Administered 2012-11-16: 14:00:00 via INTRAVENOUS

## 2012-11-16 MED ORDER — HEPARIN SODIUM (PORCINE) 1000 UNIT/ML IJ SOLN
INTRAMUSCULAR | Status: AC
  Filled 2012-11-16: qty 1

## 2012-11-16 MED ORDER — MIDAZOLAM HCL 2 MG/2ML IJ SOLN
INTRAMUSCULAR | Status: AC
  Filled 2012-11-16: qty 2

## 2012-11-16 MED ORDER — FENTANYL CITRATE 0.05 MG/ML IJ SOLN
INTRAMUSCULAR | Status: AC
  Filled 2012-11-16: qty 2

## 2012-11-16 MED ORDER — NITROGLYCERIN 0.2 MG/ML ON CALL CATH LAB
INTRAVENOUS | Status: AC
  Filled 2012-11-16: qty 1

## 2012-11-16 MED ORDER — LIDOCAINE HCL (PF) 1 % IJ SOLN
INTRAMUSCULAR | Status: AC
  Filled 2012-11-16: qty 30

## 2012-11-16 MED ORDER — VERAPAMIL HCL 2.5 MG/ML IV SOLN
INTRAVENOUS | Status: AC
  Filled 2012-11-16: qty 2

## 2012-11-16 NOTE — CV Procedure (Signed)
    Cardiac Catheterization Operative Report  [name] [id number] 8/13/20141:47 PM [name], [name], MD  Procedure Performed:  1. Left Heart Catheterization 2. Selective Coronary Angiography 3. Left ventricular angiogram  Operator: Verne Carrow, MD  Arterial access site:  Right radial artery.   Indication: 72 yo female with history of CAD with anterior MI in 2011 with placement of bare metal stent LAD at that time. Last cath 2012. Now admitted with non-specific complaints including chest pain, nausea and SOB. Cardiac markers are negative.                                      Procedure Details: The risks, benefits, complications, treatment options, and expected outcomes were discussed with the patient. The patient and/or family concurred with the proposed plan, giving informed consent. The patient was brought to the cath lab after IV hydration was begun and oral premedication was given. The patient was further sedated with Versed and Fentanyl. The right wrist was assessed with an Allens test which was positive. The right wrist was prepped and draped in a sterile fashion. 1% lidocaine was used for local anesthesia. Using the modified Seldinger access technique, a 5 French sheath was placed in the right radial artery. 3 mg Verapamil was given through the sheath. 3500 units IV heparin was given. Standard diagnostic catheters were used to perform selective coronary angiography. A pigtail catheter was used to perform a left ventricular angiogram. The sheath was removed from the right radial artery and a Terumo hemostasis band was applied at the arteriotomy site on the right wrist.    There were no immediate complications. The patient was taken to the recovery area in stable condition.   Hemodynamic Findings: Central aortic pressure: 175/78 Left ventricular pressure: 160/8/9  Angiographic Findings:  Left main:  No obstructive disease.   Left Anterior Descending Artery: Large  caliber vessel that courses to the apex. There is a patent stent in the mid LAD with mild stent restenosis, 20%. There are two moderate caliber diagonal branches without obstructive disease.   Circumflex Artery: Moderate caliber vessel with small to moderate caliber intermediate branch and termination of the AV groove Circumflex into an obtuse marginal branch. The intermediate branch is bifurcating, 2.0 mm vessel. Previous catheterization have demonstrated at least moderate stenosis in the intermediate branch just before the bifurcation. There appears to be a stable 70% stenosis at this time in the intermediate branch which is unchanged from prior catheterizations.   Right Coronary Artery: Large, dominant vesssel with at least moderate calcification in the mid segment of the vessel with 20-30% stenosis in the mid segment. The PDA is a small caliber bifurcating vessel without obstructive disease. The posterolateral branches are large and patent with no obstructive disease.   Left Ventricular Angiogram: LVEF=65%  Impression: 1. Double vessel CAD with patent mid LAD stent, moderate stenosis intermediate branch. The stenosis in the intermediate branch is compared to previous films from 2012 and does not appear to be grossly changed. This is not felt to represent a flow limiting lesion that would explain her symptoms.  2. Preserved LV systolic function  Recommendations: Continue medical management.        Complications:  None. The patient tolerated the procedure well.

## 2012-11-16 NOTE — Progress Notes (Signed)
TELEMETRY: Reviewed telemetry pt in NSR: Filed Vitals:   11/15/12 1730 11/15/12 1800 11/15/12 2052 11/16/12 0339  BP: 149/86 148/69 149/78 146/67  Pulse: 60  58 62  Temp:  98.6 F (37 C) 98.1 F (36.7 C) 97.4 F (36.3 C)  TempSrc:   Oral Oral  Resp: 20 18 18 20   Height:   5\' 5"  (1.651 m)   Weight:   157 lb 10.1 oz (71.5 kg)   SpO2: 94% 100% 96% 95%    Intake/Output Summary (Last 24 hours) at 11/16/12 0714 Last data filed at 11/16/12 1610  Gross per 24 hour  Intake 785.49 ml  Output      0 ml  Net 785.49 ml    SUBJECTIVE Chest pain has abated. Denies SOB. Remote history of smoking- none recently.  LABS: Basic Metabolic Panel:  Recent Labs  96/04/54 1030 11/15/12 1042  NA 133* 133*  K 5.0 4.9  CL 95* 98  CO2 27  --   GLUCOSE 100* 97  BUN 8 8  CREATININE 0.55 0.70  CALCIUM 9.7  --    Liver Function Tests:  Recent Labs  11/15/12 1030  AST 30  ALT 18  ALKPHOS 96  BILITOT 0.5  PROT 7.6  ALBUMIN 3.8   CBC:  Recent Labs  11/15/12 1030 11/15/12 1042  WBC 8.4  --   NEUTROABS 6.4  --   HGB 15.0 16.0*  HCT 42.6 47.0*  MCV 97.5  --   PLT 262  --    Cardiac Enzymes:  Recent Labs  11/15/12 2320 11/16/12 0400  TROPONINI <0.30 <0.30    Radiology/Studies:  Dg Chest Port 1 View  11/15/2012   *RADIOLOGY REPORT*  Clinical Data: Chest pain.  Shortness of breath.  Prior MI and 2011.  Current history of emphysema.  PORTABLE CHEST - 1 VIEW  Comparison: CTA chest 03/05/2012.  Portable chest x-rays 03/04/2012, 04/13/2010, 04/10/2010.  Two-view chest x-ray 08/22/2009.  Findings: Cardiac silhouette mildly enlarged but stable.  Thoracic aorta mildly tortuous and atherosclerotic, unchanged.  Hilar and mediastinal contours otherwise unremarkable.  Prominent bronchovascular markings diffusely and moderate central peribronchial thickening, more so than on the prior examinations. Suboptimal inspiration accounts for crowded bronchovascular markings at the bases.   Possible nodule in the right upper lobe, not visualized on the prior examinations.  Lungs otherwise clear.  IMPRESSION:  1.  Suboptimal inspiration.  Moderate changes of acute bronchitis and/or asthma superimposed upon COPD/emphysema. 2.  Possible right upper lobe lung nodule.  Non-emergent CT of the chest, with contrast if possible, may be confirmatory. 3.  Stable cardiomegaly without pulmonary edema.   Original Report Authenticated By: Hulan Saas, M.D.   Ecg:NSR with PACs, LAD, poor R wave progression.  PHYSICAL EXAM General: Well developed, well nourished, in no acute distress. Head: Normal Neck: Negative for carotid bruits. JVD not elevated. Lungs: Clear bilaterally to auscultation without wheezes, rales, or rhonchi. Breathing is unlabored. Heart: RRR S1 S2 without murmurs, rubs, or gallops.  Abdomen: Soft, non-tender, non-distended with normoactive bowel sounds. No hepatomegaly. No rebound/guarding. No obvious abdominal masses. Msk:  Strength and tone appears normal for age. Extremities: No clubbing, cyanosis or edema.  Distal pedal pulses are 2+ and equal bilaterally. Neuro: Alert and oriented X 3. Moves all extremities spontaneously. Psych:  Responds to questions appropriately with a normal affect.  ASSESSMENT AND PLAN: 1. Unstable angina. Cardiac enzymes are all negative. S/p anterior MI 2011 with BMS to LAD. Normal Myoview 12/13. Recent onset exertional dyspnea  and chest pain progressing to rest angina. Will proceed with cardiac cath today. 2. GERD 3 Hyperlipidemia. 4. ? Pulmonary nodule. CT 11/13 showed mild emphysema.  Active Problems:   * No active hospital problems. *    Signed, Keymora Grillot [place] MD,FACC 11/16/2012 7:20 AM

## 2012-11-16 NOTE — Interval H&P Note (Signed)
History and Physical Interval Note:  11/16/2012 1:10 PM  [name]  has presented today for cardiac cath with the diagnosis of Chest pain  The various methods of treatment have been discussed with the patient and family. After consideration of risks, benefits and other options for treatment, the patient has consented to  Procedure(s): LEFT HEART CATHETERIZATION WITH CORONARY ANGIOGRAM (N/A) as a surgical intervention .  The patient's history has been reviewed, patient examined, no change in status, stable for surgery.  I have reviewed the patient's chart and labs.  Questions were answered to the patient's satisfaction.    Cath Lab Visit (complete for each Cath Lab visit)  Clinical Evaluation Leading to the Procedure:   ACS: no  Non-ACS:    Anginal Classification: CCS III  Anti-ischemic medical therapy: Maximal Therapy (2 or more classes of medications)  Non-Invasive Test Results: No non-invasive testing performed  Prior CABG: No previous CABG        [name]

## 2012-11-16 NOTE — Progress Notes (Signed)
ANTICOAGULATION CONSULT NOTE - Follow Up Consult  Pharmacy Consult for Heparin  Indication: chest pain/ACS  Allergies  Allergen Reactions  . Codeine Nausea And Vomiting  . Sulfonamide Derivatives Nausea And Vomiting    Patient Measurements: Height: 5\' 5"  (165.1 cm) Weight: 157 lb 10.1 oz (71.5 kg) IBW/kg (Calculated) : 57 Heparin Dosing Weight:   Vital Signs: Temp: 97.4 F (36.3 C) (08/13 0339) Temp src: Oral (08/13 0339) BP: 177/89 mmHg (08/13 1114) Pulse Rate: 61 (08/13 1114)  Labs:  Recent Labs  11/15/12 1030 11/15/12 1042 11/15/12 1825 11/15/12 2320 11/16/12 0400 11/16/12 0522 11/16/12 1016 11/16/12 1020  HGB 15.0 16.0*  --   --   --   --  13.4  --   HCT 42.6 47.0*  --   --   --   --  38.7  --   PLT 262  --   --   --   --   --  228  --   LABPROT  --   --   --   --   --  13.5  --   --   INR  --   --   --   --   --  1.05  --   --   HEPARINUNFRC  --   --  0.40  --   --   --  0.20*  --   CREATININE 0.55 0.70  --   --   --   --   --   --   TROPONINI  --   --   --  <0.30 <0.30  --   --  <0.30    Estimated Creatinine Clearance: 62.1 ml/min (by C-G formula based on Cr of 0.7).   Medications:  Scheduled:  . aspirin EC  81 mg Oral Daily  . aspirin EC  81 mg Oral Daily  . atorvastatin  80 mg Oral q1800  . calcium-vitamin D  1 tablet Oral BID  . metoprolol succinate  25 mg Oral Daily  . mometasone-formoterol  2 puff Inhalation BID  . ondansetron  4 mg Intravenous Once  . pantoprazole  40 mg Oral Daily  . sodium chloride  3 mL Intravenous Q12H    Assessment: 72yo female with ACS, for cath today.  Heparin level subtherapeutic on 800 units/hr.  CBC is wnl.  No bleeding problems noted.  Goal of Therapy:  Heparin level 0.3-0.7 units/ml Monitor platelets by anticoagulation protocol: Yes   Plan:  1.  Increase heparin to 900 units/hr 2.  F/U after cath  Marisue Humble, PharmD Clinical Pharmacist [facility]

## 2012-11-16 NOTE — Care Management Note (Unsigned)
    Page 1 of 1   11/16/2012     3:52:51 PM   CARE MANAGEMENT NOTE 11/16/2012  Patient:  [name], [name]   Account Number:  [id number]  Date Initiated:  11/16/2012  Documentation initiated by:  Glyn Zendejas  Subjective/Objective Assessment:   PT ADM ON 11/15/12 WITH UNSTABLE ANGINA.  PTA, PT INDEPENDENT, LIVES WITH HUSBAND.     Action/Plan:   WILL FOLLOW FOR HOME NEEDS AS PT PROGRESSES.   Anticipated DC Date:  11/17/2012   Anticipated DC Plan:  HOME/SELF CARE      DC Planning Services  CM consult      Choice offered to / List presented to:             Status of service:  In process, will continue to follow Medicare Important Message given?   (If response is "NO", the following Medicare IM given date fields will be blank) Date Medicare IM given:   Date Additional Medicare IM given:    Discharge Disposition:    Per UR Regulation:  Reviewed for med. necessity/level of care/duration of stay  If discussed at Long Length of Stay Meetings, dates discussed:    Comments:

## 2012-11-16 NOTE — H&P (View-Only) (Signed)
 TELEMETRY: Reviewed telemetry pt in NSR: Filed Vitals:   11/15/12 1730 11/15/12 1800 11/15/12 2052 11/16/12 0339  BP: 149/86 148/69 149/78 146/67  Pulse: 60  58 62  Temp:  98.6 F (37 C) 98.1 F (36.7 C) 97.4 F (36.3 C)  TempSrc:   Oral Oral  Resp: 20 18 18 20  Height:   5' 5" (1.651 m)   Weight:   157 lb 10.1 oz (71.5 kg)   SpO2: 94% 100% 96% 95%    Intake/Output Summary (Last 24 hours) at 11/16/12 0714 Last data filed at 11/16/12 0609  Gross per 24 hour  Intake 785.49 ml  Output      0 ml  Net 785.49 ml    SUBJECTIVE Chest pain has abated. Denies SOB. Remote history of smoking- none recently.  LABS: Basic Metabolic Panel:  Recent Labs  11/15/12 1030 11/15/12 1042  NA 133* 133*  K 5.0 4.9  CL 95* 98  CO2 27  --   GLUCOSE 100* 97  BUN 8 8  CREATININE 0.55 0.70  CALCIUM 9.7  --    Liver Function Tests:  Recent Labs  11/15/12 1030  AST 30  ALT 18  ALKPHOS 96  BILITOT 0.5  PROT 7.6  ALBUMIN 3.8   CBC:  Recent Labs  11/15/12 1030 11/15/12 1042  WBC 8.4  --   NEUTROABS 6.4  --   HGB 15.0 16.0*  HCT 42.6 47.0*  MCV 97.5  --   PLT 262  --    Cardiac Enzymes:  Recent Labs  11/15/12 2320 11/16/12 0400  TROPONINI <0.30 <0.30    Radiology/Studies:  Dg Chest Port 1 View  11/15/2012   *RADIOLOGY REPORT*  Clinical Data: Chest pain.  Shortness of breath.  Prior MI and 2011.  Current history of emphysema.  PORTABLE CHEST - 1 VIEW  Comparison: CTA chest 03/05/2012.  Portable chest x-rays 03/04/2012, 04/13/2010, 04/10/2010.  Two-view chest x-ray 08/22/2009.  Findings: Cardiac silhouette mildly enlarged but stable.  Thoracic aorta mildly tortuous and atherosclerotic, unchanged.  Hilar and mediastinal contours otherwise unremarkable.  Prominent bronchovascular markings diffusely and moderate central peribronchial thickening, more so than on the prior examinations. Suboptimal inspiration accounts for crowded bronchovascular markings at the bases.   Possible nodule in the right upper lobe, not visualized on the prior examinations.  Lungs otherwise clear.  IMPRESSION:  1.  Suboptimal inspiration.  Moderate changes of acute bronchitis and/or asthma superimposed upon COPD/emphysema. 2.  Possible right upper lobe lung nodule.  Non-emergent CT of the chest, with contrast if possible, may be confirmatory. 3.  Stable cardiomegaly without pulmonary edema.   Original Report Authenticated By: Thomas Lawrence, M.D.   Ecg:NSR with PACs, LAD, poor R wave progression.  PHYSICAL EXAM General: Well developed, well nourished, in no acute distress. Head: Normal Neck: Negative for carotid bruits. JVD not elevated. Lungs: Clear bilaterally to auscultation without wheezes, rales, or rhonchi. Breathing is unlabored. Heart: RRR S1 S2 without murmurs, rubs, or gallops.  Abdomen: Soft, non-tender, non-distended with normoactive bowel sounds. No hepatomegaly. No rebound/guarding. No obvious abdominal masses. Msk:  Strength and tone appears normal for age. Extremities: No clubbing, cyanosis or edema.  Distal pedal pulses are 2+ and equal bilaterally. Neuro: Alert and oriented X 3. Moves all extremities spontaneously. Psych:  Responds to questions appropriately with a normal affect.  ASSESSMENT AND PLAN: 1. Unstable angina. Cardiac enzymes are all negative. S/p anterior MI 2011 with BMS to LAD. Normal Myoview 12/13. Recent onset exertional dyspnea   and chest pain progressing to rest angina. Will proceed with cardiac cath today. 2. GERD 3 Hyperlipidemia. 4. ? Pulmonary nodule. CT 11/13 showed mild emphysema.  Active Problems:   * No active hospital problems. *    Signed, Peter Jordan MD,FACC 11/16/2012 7:20 AM    

## 2012-11-17 ENCOUNTER — Ambulatory Visit: Payer: Medicare Other | Admitting: Cardiology

## 2012-11-17 ENCOUNTER — Other Ambulatory Visit: Payer: Self-pay | Admitting: Physician Assistant

## 2012-11-17 ENCOUNTER — Encounter (HOSPITAL_COMMUNITY): Payer: Self-pay | Admitting: Physician Assistant

## 2012-11-17 DIAGNOSIS — I2510 Atherosclerotic heart disease of native coronary artery without angina pectoris: Secondary | ICD-10-CM

## 2012-11-17 DIAGNOSIS — J449 Chronic obstructive pulmonary disease, unspecified: Secondary | ICD-10-CM

## 2012-11-17 DIAGNOSIS — I10 Essential (primary) hypertension: Secondary | ICD-10-CM

## 2012-11-17 LAB — CBC
HCT: 39.6 % (ref 36.0–46.0)
Hemoglobin: 14.2 g/dL (ref 12.0–15.0)
MCH: 34.8 pg — ABNORMAL HIGH (ref 26.0–34.0)
MCHC: 35.9 g/dL (ref 30.0–36.0)
MCV: 97.1 fL (ref 78.0–100.0)
Platelets: 245 10*3/uL (ref 150–400)
RBC: 4.08 MIL/uL (ref 3.87–5.11)
RDW: 12.7 % (ref 11.5–15.5)
WBC: 6.3 10*3/uL (ref 4.0–10.5)

## 2012-11-17 MED ORDER — LOSARTAN POTASSIUM 50 MG PO TABS: 50.0000 mg | ORAL_TABLET | Freq: Every day | ORAL | Status: DC

## 2012-11-17 MED ORDER — MOMETASONE FURO-FORMOTEROL FUM 100-5 MCG/ACT IN AERO: 2.0000 | INHALATION_SPRAY | Freq: Two times a day (BID) | RESPIRATORY_TRACT | Status: DC

## 2012-11-17 MED ORDER — LOSARTAN POTASSIUM 50 MG PO TABS
50.0000 mg | ORAL_TABLET | Freq: Every day | ORAL | Status: DC
  Administered 2012-11-17: 50 mg via ORAL
  Filled 2012-11-17: qty 1

## 2012-11-17 NOTE — Discharge Summary (Signed)
Patient seen and examined and history reviewed. Agree with above findings and plan. See earlier rounding note.  Thedora Hinders 11/17/2012 12:38 PM

## 2012-11-17 NOTE — Progress Notes (Signed)
Discharge instructions along with med list and scripts provided. IV d/c'd with catheter intact. Right radial remains intact with no bleeding or hematoma. Patient verbalized understanding of d/c instructions. Patient transported via wheelchair to lobby for d/c home. Belongings with patient.[name]

## 2012-11-17 NOTE — Progress Notes (Signed)
TELEMETRY: Reviewed telemetry pt in NSR: Filed Vitals:   11/16/12 1304 11/16/12 1641 11/16/12 1952 11/17/12 0454  BP:  150/72 154/79 166/88  Pulse: 57 63 66 63  Temp:  97.8 F (36.6 C) 98.2 F (36.8 C) 98.6 F (37 C)  TempSrc:  Oral Oral Oral  Resp:   18 20  Height:      Weight:      SpO2:  95% 95% 95%    Intake/Output Summary (Last 24 hours) at 11/17/12 0703 Last data filed at 11/17/12 0455  Gross per 24 hour  Intake    240 ml  Output   2300 ml  Net  -2060 ml    SUBJECTIVE Chest pain has abated. Denies SOB. Remote history of smoking- none recently. States she stopped taking Advair 2 weeks ago and this may have contributed to recent congestion and SOB. [name] has helped.  LABS: Basic Metabolic Panel:  Recent Labs  09/81/19 1030 11/15/12 1042  NA 133* 133*  K 5.0 4.9  CL 95* 98  CO2 27  --   GLUCOSE 100* 97  BUN 8 8  CREATININE 0.55 0.70  CALCIUM 9.7  --    Liver Function Tests:  Recent Labs  11/15/12 1030  AST 30  ALT 18  ALKPHOS 96  BILITOT 0.5  PROT 7.6  ALBUMIN 3.8   CBC:  Recent Labs  11/15/12 1030  11/16/12 1016 11/17/12 0502  WBC 8.4  --  4.8 6.3  NEUTROABS 6.4  --   --   --   HGB 15.0  < > 13.4 14.2  HCT 42.6  < > 38.7 39.6  MCV 97.5  --  97.2 97.1  PLT 262  --  228 245  < > = values in this interval not displayed. Cardiac Enzymes:  Recent Labs  11/15/12 2320 11/16/12 0400 11/16/12 1020  TROPONINI <0.30 <0.30 <0.30    Radiology/Studies:  Dg Chest Port 1 View  11/15/2012   *RADIOLOGY REPORT*  Clinical Data: Chest pain.  Shortness of breath.  Prior MI and 2011.  Current history of emphysema.  PORTABLE CHEST - 1 VIEW  Comparison: CTA chest 03/05/2012.  Portable chest x-rays 03/04/2012, 04/13/2010, 04/10/2010.  Two-view chest x-ray 08/22/2009.  Findings: Cardiac silhouette mildly enlarged but stable.  Thoracic aorta mildly tortuous and atherosclerotic, unchanged.  Hilar and mediastinal contours otherwise unremarkable.  Prominent  bronchovascular markings diffusely and moderate central peribronchial thickening, more so than on the prior examinations. Suboptimal inspiration accounts for crowded bronchovascular markings at the bases.  Possible nodule in the right upper lobe, not visualized on the prior examinations.  Lungs otherwise clear.  IMPRESSION:  1.  Suboptimal inspiration.  Moderate changes of acute bronchitis and/or asthma superimposed upon COPD/emphysema. 2.  Possible right upper lobe lung nodule.  Non-emergent CT of the chest, with contrast if possible, may be confirmatory. 3.  Stable cardiomegaly without pulmonary edema.   Original Report Authenticated By: [name], M.D.   Ecg:NSR with PACs, LAD, poor R wave progression.  Cardiac Catheterization Operative Report  [name]  [id number]  8/13/20141:47 PM  [name], [name] Ishihara, MD  Procedure Performed:  1. Left Heart Catheterization 2. Selective Coronary Angiography 3. Left ventricular angiogram Operator: [name] Carrow, MD  Arterial access site: Right radial artery.  Indication: 72 yo female with history of CAD with anterior MI in 2011 with placement of bare metal stent LAD at that time. Last cath 2012. Now admitted with non-specific complaints including chest pain, nausea and SOB. Cardiac  markers are negative.  Procedure Details:  The risks, benefits, complications, treatment options, and expected outcomes were discussed with the patient. The patient and/or family concurred with the proposed plan, giving informed consent. The patient was brought to the cath lab after IV hydration was begun and oral premedication was given. The patient was further sedated with Versed and Fentanyl. The right wrist was assessed with an Allens test which was positive. The right wrist was prepped and draped in a sterile fashion. 1% lidocaine was used for local anesthesia. Using the modified Seldinger access technique, a 5 French sheath was placed in the right radial artery. 3 mg  Verapamil was given through the sheath. 3500 units IV heparin was given. Standard diagnostic catheters were used to perform selective coronary angiography. A pigtail catheter was used to perform a left ventricular angiogram. The sheath was removed from the right radial artery and a Terumo hemostasis band was applied at the arteriotomy site on the right wrist.  There were no immediate complications. The patient was taken to the recovery area in stable condition.  Hemodynamic Findings:  Central aortic pressure: 175/78  Left ventricular pressure: 160/8/9  Angiographic Findings:  Left main: No obstructive disease.  Left Anterior Descending Artery: Large caliber vessel that courses to the apex. There is a patent stent in the mid LAD with mild stent restenosis, 20%. There are two moderate caliber diagonal branches without obstructive disease.  Circumflex Artery: Moderate caliber vessel with small to moderate caliber intermediate branch and termination of the AV groove Circumflex into an obtuse marginal branch. The intermediate branch is bifurcating, 2.0 mm vessel. Previous catheterization have demonstrated at least moderate stenosis in the intermediate branch just before the bifurcation. There appears to be a stable 70% stenosis at this time in the intermediate branch which is unchanged from prior catheterizations.  Right Coronary Artery: Large, dominant vesssel with at least moderate calcification in the mid segment of the vessel with 20-30% stenosis in the mid segment. The PDA is a small caliber bifurcating vessel without obstructive disease. The posterolateral branches are large and patent with no obstructive disease.  Left Ventricular Angiogram: LVEF=65%  Impression:  1. Double vessel CAD with patent mid LAD stent, moderate stenosis intermediate branch. The stenosis in the intermediate branch is compared to previous films from 2012 and does not appear to be grossly changed. This is not felt to represent a  flow limiting lesion that would explain her symptoms.  2. Preserved LV systolic function  Recommendations: Continue medical management.  Complications: None. The patient tolerated the procedure well.   PHYSICAL EXAM General: Well developed, well nourished, in no acute distress. Head: Normal Neck: Negative for carotid bruits. JVD not elevated. Lungs: Clear bilaterally to auscultation without wheezes, rales, or rhonchi. Breathing is unlabored. Heart: RRR S1 S2 without murmurs, rubs, or gallops.  Abdomen: Soft, non-tender, non-distended with normoactive bowel sounds. No hepatomegaly. No rebound/guarding. No obvious abdominal masses. Msk:  Strength and tone appears normal for age. Extremities: No clubbing, cyanosis or edema.  Distal pedal pulses are 2+ and equal bilaterally. Neuro: Alert and oriented X 3. Moves all extremities spontaneously. Psych:  Responds to questions appropriately with a normal affect.  ASSESSMENT AND PLAN: 1. Unstable angina. Cardiac enzymes are all negative. S/p anterior MI 2011 with BMS to LAD. Normal Myoview 12/13. Cardiac cath is stable. Continue prior medical therapy. 2. GERD 3 Hyperlipidemia. 4. COPD- will continue Dulera at discharge. 5. HTN. Patient concerned about elevated BP. On metoprolol. Will add ARB losartan  50 mg daily.   Patient stable for DC today. F/u in office in 2 weeks.  Principal Problem:   Unstable angina Active Problems:   HYPERLIPIDEMIA   C A D   COPD    Leonides Schanz Valencia Kassa [place] MD,FACC 11/17/2012 7:03 AM

## 2012-11-17 NOTE — Discharge Summary (Signed)
Discharge Summary   Patient ID: [name] MRN: [id number], DOB/AGE: [birth year] 72 y.o. Admit date: 11/15/2012 D/C date:     11/17/2012  Primary Cardiologist:  ?Was going to be [facility], but saw [place] this admission and had outpatient appt scheduled with him prior to admission  Primary Discharge Diagnoses:  1. CAD/unstable angina - cardiac cath stable this admission - history: 08/2009: Ant MI c/b v-fib arrest, s/p PTCA/BMS to LAD 2. GERD 3. HLD 4. COPD 5. HTN - ARB added 6. RUL nodule  - instructed to f/u PCP   Secondary Discharge Diagnoses:  1. 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 OA 3. Osteopenia 4. H/o rib fractures and T9 compression fracture 5. PONV 6. Ischemic cardiomyopathy EF 40-45% in 08/2009 at time of MI. b. Improved to 55-60% by June 2011 7. Postoperative groin pseudoaneurysm - 04/2010 following diagnostic cardiac cath, s/p compression  Hospital Course: [name] is a 72 y/o F with history of CAD (ant MI 08/2009 c/b v-fib arrest s/p BMS to LAD), HTN, HLD, COPD who presented to [facility] 11/15/2012 with complaints of nausea, vomiting, angina-like chest pain, and dyspnea for the past several days. She was scheduled to see Dr. [place] this week. When she went out to breakfast the morning of admission, she could not eat anything -ate only 1 bite. She became dizzy. She had some coffee. Her friend called EMS. She ran out of her prescription of Advair about a week ago. She also reported severe chest heaviness/SOB the other day with water aerobics. In the ED, EKG showed NSR without ST-T wave changes. CXR shoed moderate changes of acute bronchitis and/or asthma superimposed upon COPD/emphysema, possible RUL lung nodule, and stable cardiomegaly without pulmonary edema. She was admitted for further evaluation and placed on heparin per pharmacy. Troponins remained negative. Given her history, cardiac cath was recommended. She underwent this procedure yesterday demonstrating:  Impression:  1. Double vessel CAD with patent mid LAD stent, moderate stenosis intermediate branch. The stenosis in the intermediate branch is compared to previous films from 2012 and does not appear to be grossly changed. This is not felt to represent a flow limiting lesion that would explain her symptoms.  2. Preserved LV systolic function EF 65% Med rx was recommended. Her blood pressure medication was adjusted with addition of Losartan. Given this addition, will recheck BMET next week to ensure stability of electrolytes including potassium and kidney function. Dr. [place] felt that the patient stopping her Advair may have contributed to recent congestion and SOB. Elwin Sleight has helped. Her chest pain has abated and SOB is gone. Dr. [place] has seen and examined the patient today and feels she is stable for discharge.   With regard to RUL nodule on CXR, the patient had a CT 02/2012 that showed emphysema but did not comment on a nodule. The patient was instructed to further discuss with primary doctor. I personally discussed this with her and she verbalized understanding.  Discharge Vitals: Blood pressure 166/88, pulse 63, temperature 98.6 F (37 C), temperature source Oral, resp. rate 20, height 5\' 5"  (1.651 m), weight 157 lb 10.1 oz (71.5 kg), SpO2 95.00%.  Labs: Lab Results  Component Value Date   WBC 6.3 11/17/2012   HGB 14.2 11/17/2012   HCT 39.6 11/17/2012   MCV 97.1 11/17/2012   PLT 245 11/17/2012    Recent Labs Lab 11/15/12 1030 11/15/12 1042  NA 133* 133*  K 5.0 4.9  CL 95* 98  CO2 27  --  BUN 8 8  CREATININE 0.55 0.70  CALCIUM 9.7  --   PROT 7.6  --   BILITOT 0.5  --   ALKPHOS 96  --   ALT 18  --   AST 30  --   GLUCOSE 100* 97    Recent Labs  11/15/12 2320 11/16/12 0400 11/16/12 1020  TROPONINI <0.30 <0.30 <0.30     Diagnostic Studies/Procedures   Cardiac catheterization this admission, please see full report and above for summary.  Dg Chest Port 1 View8/03/2013    *RADIOLOGY REPORT*  Clinical Data: Chest pain.  Shortness of breath.  Prior MI and 2011.  Current history of emphysema.  PORTABLE CHEST - 1 VIEW  Comparison: CTA chest 03/05/2012.  Portable chest x-rays 03/04/2012, 04/13/2010, 04/10/2010.  Two-view chest x-ray 08/22/2009.  Findings: Cardiac silhouette mildly enlarged but stable.  Thoracic aorta mildly tortuous and atherosclerotic, unchanged.  Hilar and mediastinal contours otherwise unremarkable.  Prominent bronchovascular markings diffusely and moderate central peribronchial thickening, more so than on the prior examinations. Suboptimal inspiration accounts for crowded bronchovascular markings at the bases.  Possible nodule in the right upper lobe, not visualized on the prior examinations.  Lungs otherwise clear.  IMPRESSION:  1.  Suboptimal inspiration.  Moderate changes of acute bronchitis and/or asthma superimposed upon COPD/emphysema. 2.  Possible right upper lobe lung nodule.  Non-emergent CT of the chest, with contrast if possible, may be confirmatory. 3.  Stable cardiomegaly without pulmonary edema.   Original Report Authenticated By: Hulan Saas, M.D.    Discharge Medications     Medication List    STOP taking these medications       Fluticasone-Salmeterol 250-50 MCG/DOSE Aepb  Commonly known as:  ADVAIR DISKUS  Replaced by:  mometasone-formoterol 100-5 MCG/ACT Aero      TAKE these medications       aspirin 81 MG EC tablet  Take 81 mg by mouth daily.     Calcium Carbonate-Vitamin D 600-400 MG-UNIT per tablet  Take 1 tablet by mouth 2 (two) times daily.     diclofenac sodium 1 % Gel  Commonly known as:  VOLTAREN  Apply 2 g topically daily as needed. For pain in hands     FORTEO [place]  Inject into the skin at bedtime.     losartan 50 MG tablet  Commonly known as:  COZAAR  Take 1 tablet (50 mg total) by mouth daily.     metoprolol succinate 25 MG 24 hr tablet  Commonly known as:  TOPROL-XL  Take 1 tablet (25 mg total) by  mouth daily.     mometasone-formoterol 100-5 MCG/ACT Aero  Commonly known as:  DULERA  Inhale 2 puffs into the lungs 2 (two) times daily.     nitroGLYCERIN 0.4 MG SL tablet  Commonly known as:  NITROSTAT  Place 0.4 mg under the tongue every 5 (five) minutes as needed. For chest pain     pantoprazole 40 MG tablet  Commonly known as:  PROTONIX  Take 1 tablet (40 mg total) by mouth daily.     QC WOMENS DAILY MULTIVITAMIN Tabs  Take by mouth daily. with iron     rosuvastatin 20 MG tablet  Commonly known as:  CRESTOR  Take 1 tablet (20 mg total) by mouth at bedtime.     traMADol 50 MG tablet  Commonly known as:  ULTRAM  Take 1 tablet (50 mg total) by mouth every 6 (six) hours as needed for pain.  Disposition   The patient will be discharged in stable condition to home.     Discharge Orders   Future Appointments Provider Department Dept Phone   11/22/2012 10:30 AM [facility] Main Office [place]) [phone]   12/07/2012 12:10 PM Beatrice Lecher, PA-C [facility] Main Office [place]) [phone]   Future Orders Complete By Expires   Diet - low sodium heart healthy  As directed    Increase activity slowly  As directed    Comments:     No driving for 2 days. No lifting over 5 lbs for 1 week. No sexual activity for 1 week. Keep procedure site clean & dry. If you notice increased pain, swelling, bleeding or pus, call/return!  You may shower, but no soaking baths/hot tubs/pools for 1 week.     Follow-up Information   Follow up with [name], [name], MD. (To discuss if further imaging is necessary for your lung nodule seen on chest x-ray, and for follow-up of your COPD.)    Specialty:  Internal Medicine   Contact information:   [address] [place] [address] [phone]       Follow up with  [facility]. (Labwork only on 11/22/12 - can arrive anytime between 8am - 4:30pm)    Contact information:   [address] [place] [address]       Follow up with Tereso Newcomer, PA-C. (12/07/12 at 12:10pm)    Specialty:  Physician Assistant   Contact information:   [address] [place] [address] [phone]         Duration of Discharge Encounter: Greater than 30 minutes including physician and PA time.  Signed, Ronie Spies PA-C 11/17/2012, 11:55 AM

## 2012-11-22 ENCOUNTER — Other Ambulatory Visit (INDEPENDENT_AMBULATORY_CARE_PROVIDER_SITE_OTHER): Payer: Medicare Other

## 2012-11-22 DIAGNOSIS — I10 Essential (primary) hypertension: Secondary | ICD-10-CM

## 2012-11-22 LAB — BASIC METABOLIC PANEL
BUN: 14 mg/dL (ref 6–23)
CO2: 26 mEq/L (ref 19–32)
Calcium: 9.3 mg/dL (ref 8.4–10.5)
Chloride: 96 mEq/L (ref 96–112)
Creatinine, Ser: 0.6 mg/dL (ref 0.4–1.2)
GFR: 96.64 mL/min (ref >60.00)
Glucose, Bld: 82 mg/dL (ref 70–99)
Potassium: 5.1 mEq/L (ref 3.5–5.1)
Sodium: 131 mEq/L — ABNORMAL LOW (ref 135–145)

## 2012-11-24 ENCOUNTER — Other Ambulatory Visit: Payer: Self-pay | Admitting: Internal Medicine

## 2012-11-24 DIAGNOSIS — R9389 Abnormal findings on diagnostic imaging of other specified body structures: Secondary | ICD-10-CM

## 2012-11-25 ENCOUNTER — Ambulatory Visit
Admission: RE | Admit: 2012-11-25 | Discharge: 2012-11-25 | Disposition: A | Discharge status: Home / Self Care | Payer: Medicare Other | Source: Ambulatory Visit | Attending: Internal Medicine | Admitting: Internal Medicine

## 2012-11-25 DIAGNOSIS — R9389 Abnormal findings on diagnostic imaging of other specified body structures: Secondary | ICD-10-CM

## 2012-11-25 MED ORDER — IOHEXOL 300 MG/ML  SOLN
75.0000 mL | Freq: Once | INTRAMUSCULAR | Status: AC | PRN
  Administered 2012-11-25: 75 mL via INTRAVENOUS

## 2012-12-07 ENCOUNTER — Encounter: Payer: Self-pay | Admitting: Physician Assistant

## 2012-12-07 ENCOUNTER — Ambulatory Visit (INDEPENDENT_AMBULATORY_CARE_PROVIDER_SITE_OTHER): Payer: Medicare Other | Admitting: Physician Assistant

## 2012-12-07 VITALS — BP 141/86 | HR 60 | Ht 65.5 in | Wt 162.0 lb

## 2012-12-07 DIAGNOSIS — I2510 Atherosclerotic heart disease of native coronary artery without angina pectoris: Secondary | ICD-10-CM

## 2012-12-07 DIAGNOSIS — R079 Chest pain, unspecified: Secondary | ICD-10-CM

## 2012-12-07 DIAGNOSIS — J449 Chronic obstructive pulmonary disease, unspecified: Secondary | ICD-10-CM

## 2012-12-07 DIAGNOSIS — E785 Hyperlipidemia, unspecified: Secondary | ICD-10-CM

## 2012-12-07 DIAGNOSIS — I10 Essential (primary) hypertension: Secondary | ICD-10-CM

## 2012-12-07 MED ORDER — ISOSORBIDE MONONITRATE ER 30 MG PO TB24: 30.0000 mg | ORAL_TABLET | Freq: Every day | ORAL | Status: DC

## 2012-12-07 NOTE — Progress Notes (Signed)
[address] [place], [place]  [address] Phone: [phone] Fax:  [phone]  Date:  12/07/2012   ID:  Kiauna Zywicki, DOB 06-27-1940, MRN [id number]  PCP:  Enrique Sack, MD  Cardiologist:  Dr.  Shawnie Pons => Dr. Peter Swaziland     History of Present Illness: [name] is a 72 y.o. female who returns for follow up after recent admission to the hospital.  She has a hx of CAD (ant MI 08/2009 c/b v-fib arrest s/p BMS to LAD), HTN, HLD, COPD.  EF previously 40-45%. This improved to normal. Last echo 02/2012: Mild LVH, EF 55%, grade 1 diastolic dysfunction, trivial MR, mild LAE, trivial TR, PASP 30. Nuclear study: 03/2012: EF 67%, normal images. She was admitted 8/12-8/14 after presenting with complaints of nausea, vomiting, anginal like chest pain and dyspnea for several days. She ruled out for myocardial infarction by enzymes. LHC 8/14: Mid LAD stent patent with 20% ISR, intermediate branch 70% (unchanged from prior film), mid RCA 20-30%, EF 65%. Medical therapy was recommended. Of note, chest x-ray demonstrated possible right upper lobe nodule.  Since discharge, her PCP had her undergo a chest CT without contrast that demonstrated no acute cardiopulmonary abnormalities, emphysema and coronary artery calcifications.  She tells that she continues to have good days and bad days. She sometimes has dyspnea with going up steps. She probably describes an NYHA class II-IIb symptoms. She denies coughing or wheezing. She denies orthopnea, PND or edema. She has occasional exertional chest pressure. She may notice some radiation up her jaw. She notes associated nausea and sometimes diaphoresis. She sometimes notes palpitations with shortness of breath.  No syncope.    Labs (8/14):  K 5.1, creatinine 0.6, ALT 18, LDL 54, Hgb 14.2, TSH 2.655  Wt Readings from Last 3 Encounters:  12/07/12 162 lb (73.483 kg)  11/15/12 157 lb 10.1 oz (71.5 kg)  11/15/12 157 lb 10.1 oz (71.5 kg)      Past Medical History  Diagnosis Date  . Coronary artery disease 08-14-09    a. 08/2009: Ant MI c/b v-fib arrest, s/p PTCA/BMS to LAD.  Marland Kitchen Ventricular fibrillation     a. 08/2009: due to anterior MI.  Marland Kitchen GERD (gastroesophageal reflux disease)   . Osteoarthritis   . Osteopenia   . Rib fractures     left sided second and sixth rib   . Compression fracture     T9  . PONV (postoperative nausea and vomiting)   . Ischemic cardiomyopathy     a. EF 40-45% in 08/2009 at time of MI. b. Improved to 55-60% by June 2011.  Marland Kitchen Postoperative groin pseudoaneurysm     a. 04/2010 following diagnostic cardiac cath, s/p compression.  Marland Kitchen COPD (chronic obstructive pulmonary disease)   . HTN (hypertension)   . Hyperlipidemia     Current Outpatient Prescriptions  Medication Sig Dispense Refill  . aspirin 81 MG EC tablet Take 81 mg by mouth daily.        . Calcium Carbonate-Vitamin D 600-400 MG-UNIT per tablet Take 1 tablet by mouth 2 (two) times daily.       . diclofenac sodium (VOLTAREN) 1 % GEL Apply 2 g topically daily as needed. For pain in hands      . losartan (COZAAR) 50 MG tablet Take 1 tablet (50 mg total) by mouth daily.  30 tablet  3  . metoprolol succinate (TOPROL-XL) 25 MG 24 hr tablet Take 1 tablet (25 mg total) by mouth  daily.  90 tablet  3  . mometasone-formoterol (DULERA) 100-5 MCG/ACT AERO Inhale 2 puffs into the lungs 2 (two) times daily.  1 Inhaler  0  . Multiple Vitamins-Minerals (QC WOMENS DAILY MULTIVITAMIN) TABS Take by mouth daily. with iron       . nitroGLYCERIN (NITROSTAT) 0.4 MG SL tablet Place 0.4 mg under the tongue every 5 (five) minutes as needed. For chest pain      . pantoprazole (PROTONIX) 40 MG tablet Take 1 tablet (40 mg total) by mouth daily.  90 tablet  3  . rosuvastatin (CRESTOR) 20 MG tablet Take 1 tablet (20 mg total) by mouth at bedtime.  90 tablet  3  . Teriparatide, Recombinant, (FORTEO [place]) Inject into the skin at bedtime.       . traMADol (ULTRAM) 50 MG tablet Take  1 tablet (50 mg total) by mouth every 6 (six) hours as needed for pain.  20 tablet  0   No current facility-administered medications for this visit.    Allergies:    Allergies  Allergen Reactions  . Codeine Nausea And Vomiting  . Sulfonamide Derivatives Nausea And Vomiting    Social History:  The patient  reports that she has quit smoking. She has never used smokeless tobacco. She reports that  drinks alcohol. She reports that she does not use illicit drugs.   ROS:  Please see the history of present illness.   She denies melena, hematochezia, dysphagia, odynophagia, belching.   All other systems reviewed and negative.   PHYSICAL EXAM: VS:  BP 141/86  Pulse 60  Ht 5' 5.5" (1.664 m)  Wt 162 lb (73.483 kg)  BMI 26.54 kg/m2 Well nourished, well developed, in no acute distress HEENT: normal Neck: no JVD Cardiac:  normal S1, S2; RRR; no murmur Lungs:  Decreased breath sounds bilaterally, no wheezing, rhonchi or rales Abd: soft, nontender, no hepatomegaly Ext: no edema; right wrist without hematoma or mass  Skin: warm and dry Neuro:  CNs 2-12 intact, no focal abnormalities noted  EKG:  NSR, HR 60, LAD, no acute changes     ASSESSMENT AND PLAN:  1. CAD: She continues to have symptoms of chest discomfort as well as dyspnea with exertion. Some of her symptoms may be explained by her COPD. We had a long discussion regarding medical therapy for CAD. I will place her on a trial of the isosorbide 30 mg daily. Continue aspirin, ARB, beta blocker and statin. 2. COPD: I suspect some of her symptoms are explained by this. I have encouraged her to followup with her PCP for further management of her respiratory medications. 3. Hyperlipidemia: Continue statin. 4. Hypertension: Controlled. Recent basic metabolic panel stable. 5. Lung Nodule:  Chest CT demonstrated no nodule. No further workup appears to be planned. 6. Disposition: Followup with Dr. Swaziland in 6 weeks.  Signed, Tereso Newcomer, PA-C   12/07/2012 12:59 PM

## 2012-12-07 NOTE — Patient Instructions (Addendum)
START IMDUR 30 MG TABLET DAILY; RX WAS SENT IN TODAY   MAKE SURE TO FOLLOW UP WITH YOUR PRIMARY CARE PHYSICIAN FOR ADJUSTMENT OF YOUR RESPRITORY MEDS  PLEASE FOLLOW UP WITH DR. Swaziland 01/25/13 @ 2:45

## 2012-12-08 ENCOUNTER — Telehealth: Payer: Self-pay | Admitting: Physician Assistant

## 2012-12-08 MED ORDER — AMLODIPINE BESYLATE 2.5 MG PO TABS: 2.5000 mg | ORAL_TABLET | Freq: Every day | ORAL | Status: DC

## 2012-12-08 NOTE — Telephone Encounter (Signed)
I cb pt with recommendation from Scott W. PA to d/c Imdur and try lose of Norvasc 2.5 mg daily , rx was sent in today. Pt verbalized Plan of Care to all instructions 

## 2012-12-08 NOTE — Telephone Encounter (Signed)
I rtnd pt's call from earlier today about Imdur reaction. I asked pt what was happening and she said she took Imdur last night around 10 pm before bed and woke at 2 am w/migraine and vomiting. Pt states still very nauseated. & HA still.  I did tell pt that HA is a side effect of Imdur; however with the vomiting and nausea I will d/w PA about d/c Imdur. I advised pt not to take anymore today, pt verbalized understanding to all instructions. I told pt that I will cb later today, pt said thank you for my help and concern. I told pt she was welcome.

## 2012-12-08 NOTE — Telephone Encounter (Signed)
New Prob     Pt has some questions regarding ISOSORBRIDE. Please call,.

## 2012-12-08 NOTE — Telephone Encounter (Signed)
Stop Imdur. Could try Norvasc 2.5 mg QD is she is willing.  If not, monitor symptoms and follow up as planned. Tereso Newcomer, PA-C   12/08/2012 5:07 PM

## 2012-12-08 NOTE — Telephone Encounter (Signed)
Follow Up    Pt has some questions regarding ISOSORBRIDE. She is having some reactions. Please call.

## 2012-12-08 NOTE — Telephone Encounter (Signed)
I cb pt with recommendation from [place] W. PA to d/c Imdur and try lose of Norvasc 2.5 mg daily , rx was sent in today. Pt verbalized Plan of Care to all instructions

## 2013-01-25 ENCOUNTER — Ambulatory Visit (INDEPENDENT_AMBULATORY_CARE_PROVIDER_SITE_OTHER): Payer: Medicare Other | Admitting: Cardiology

## 2013-01-25 ENCOUNTER — Encounter: Payer: Self-pay | Admitting: Cardiology

## 2013-01-25 VITALS — BP 120/70 | HR 75 | Ht 65.5 in | Wt 162.0 lb

## 2013-01-25 DIAGNOSIS — E785 Hyperlipidemia, unspecified: Secondary | ICD-10-CM

## 2013-01-25 DIAGNOSIS — R0602 Shortness of breath: Secondary | ICD-10-CM

## 2013-01-25 DIAGNOSIS — J449 Chronic obstructive pulmonary disease, unspecified: Secondary | ICD-10-CM

## 2013-01-25 DIAGNOSIS — I2510 Atherosclerotic heart disease of native coronary artery without angina pectoris: Secondary | ICD-10-CM

## 2013-01-25 NOTE — Progress Notes (Signed)
[name] Date of Birth: 11/17/1940 Medical Record #[id number]  History of Present Illness: [name] is seen for followup today. She is a pleasant 72 year old white female with history of coronary disease. 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 She had stenting of the LAD with a bare-metal stent. Initial ejection fraction was 40-45%. This improved to 55% by echo in November 2013. She was admitted in August of this year with symptoms of nausea vomiting and chest pain. Cardiac catheterization demonstrated continued patency of the stent in the mid LAD. There is a 70% stenosis in the ramus intermediate branch which was unchanged. Otherwise nonobstructive disease. Ejection fraction was 65%. EDP was 9 mmHg She was treated medically. She also has a history of hypertension, hyperlipidemia, COPD. On followup today she states she has had increased dyspnea over the past one to 2 months. It is unchanged. She was tried on anti-anginal therapy with isosorbide but was unable to tolerate this due to headaches. She was placed on amlodipine with no change in her symptoms. She was switched from Advair to [facility] are and also noted no change in her symptoms. She denies any active wheezing, cough, or edema.  Current Outpatient Prescriptions on File Prior to Visit  Medication Sig Dispense Refill  . aspirin 81 MG EC tablet Take 81 mg by mouth daily.        . Calcium Carbonate-Vitamin D 600-400 MG-UNIT per tablet Take 1 tablet by mouth 2 (two) times daily.       . diclofenac sodium (VOLTAREN) 1 % GEL Apply 2 g topically daily as needed. For pain in hands      . losartan (COZAAR) 50 MG tablet Take 1 tablet (50 mg total) by mouth daily.  30 tablet  3  . metoprolol succinate (TOPROL-XL) 25 MG 24 hr tablet Take 1 tablet (25 mg total) by mouth daily.  90 tablet  3  . mometasone-formoterol (DULERA) 100-5 MCG/ACT AERO Inhale 2 puffs into the lungs 2 (two) times  daily.  1 Inhaler  0  . Multiple Vitamins-Minerals (QC WOMENS DAILY MULTIVITAMIN) TABS Take by mouth daily. with iron       . nitroGLYCERIN (NITROSTAT) 0.4 MG SL tablet Place 0.4 mg under the tongue every 5 (five) minutes as needed. For chest pain      . pantoprazole (PROTONIX) 40 MG tablet Take 1 tablet (40 mg total) by mouth daily.  90 tablet  3  . rosuvastatin (CRESTOR) 20 MG tablet Take 1 tablet (20 mg total) by mouth at bedtime.  90 tablet  3  . Teriparatide, Recombinant, (FORTEO [place]) Inject into the skin at bedtime.       . traMADol (ULTRAM) 50 MG tablet Take 1 tablet (50 mg total) by mouth every 6 (six) hours as needed for pain.  20 tablet  0   No current facility-administered medications on file prior to visit.    Allergies  Allergen Reactions  . Codeine Nausea And Vomiting  . Imdur [Isosorbide Dinitrate] Nausea Only    Headache  . Sulfonamide Derivatives Nausea And Vomiting    Past Medical History  Diagnosis Date  . Coronary artery disease 08-14-09    a. 08/2009: Ant MI c/b v-fib arrest, s/p PTCA/BMS to LAD.  Marland Kitchen Ventricular fibrillation     a. 08/2009: due to anterior MI.  Marland Kitchen GERD (gastroesophageal reflux disease)   . Osteoarthritis   . Osteopenia   . Rib fractures  left sided second and sixth rib   . Compression fracture     T9  . PONV (postoperative nausea and vomiting)   . Ischemic cardiomyopathy     a. EF 40-45% in 08/2009 at time of MI. b. Improved to 55-60% by June 2011.  Marland Kitchen Postoperative groin pseudoaneurysm     a. 04/2010 following diagnostic cardiac cath, s/p compression.  Marland Kitchen COPD (chronic obstructive pulmonary disease)   . HTN (hypertension)   . Hyperlipidemia     Past Surgical History  Procedure Laterality Date  . Tonsillectomy    . Appendectomy    . Orif hip fracture    . Leg surgery      R low  . Back surgery      History  Smoking status  . Former Smoker -- 20 years  Smokeless tobacco  . Never Used    History  Alcohol Use  . Yes    Comment:  No history of alcohol abuse    History reviewed. No pertinent family history.  Review of Systems: As noted in history of present illness.  All other systems were reviewed and are negative.  Physical Exam: BP 120/70  Pulse 75  Ht 5' 5.5" (1.664 m)  Wt 162 lb (73.483 kg)  BMI 26.54 kg/m2  SpO2 95% She is a pleasant, elderly white female in no acute distress. HEENT: Normal Neck: No JVD or bruits. No thyromegaly or adenopathy. Lungs: Decreased breath sounds throughout. No active wheezes or rhonchi. No wheezing. Cardiovascular: Regular rate and rhythm without gallop, murmur, or click.  Back: Moderate kyphosis Abdomen: Soft and nontender. No masses. Bowel sounds are positive. Extremities: No cyanosis or edema. Skin: Warm and dry Neuro: Alert and oriented x3. Cranial nerves II through XII are intact.  LABORATORY DATA:   Assessment / Plan: 1. Dyspnea. Based on her extensive cardiac evaluation this past month I think that her symptoms are related to COPD. She has normal LV function now and no significant obstructive coronary disease. Left ventricular filling pressures are normal. Echocardiogram 1 year ago showed no significant valvular abnormalities. Since she has had no improvement with antianginal therapy I recommended stopping her amlodipine. We will continue her other cardiac therapy.  2. Coronary disease with prior anterior myocardial infarction. Status post bare-metal stent to the LAD.  3. COPD. We'll continue inhaler therapy. Recommend followup with her primary care. Patient did have limited spirometry in 2012 which showed moderate obstructive disease.  4. Hyperlipidemia. Continue statin therapy.

## 2013-01-25 NOTE — Patient Instructions (Addendum)
Stop taking amlodipine  Continue your other therapy  Continue your aerobic exercise  I will see you in 4 months

## 2013-02-09 ENCOUNTER — Other Ambulatory Visit: Payer: Self-pay

## 2013-03-24 ENCOUNTER — Other Ambulatory Visit: Payer: Self-pay | Admitting: Cardiology

## 2013-05-31 ENCOUNTER — Other Ambulatory Visit (HOSPITAL_COMMUNITY): Payer: Self-pay

## 2013-06-02 ENCOUNTER — Inpatient Hospital Stay (HOSPITAL_COMMUNITY): Admission: RE | Admit: 2013-06-02 | Payer: Medicare Other | Source: Ambulatory Visit

## 2013-06-13 ENCOUNTER — Encounter: Payer: Self-pay | Admitting: Physician Assistant

## 2013-06-27 ENCOUNTER — Other Ambulatory Visit (HOSPITAL_COMMUNITY): Payer: Self-pay | Admitting: *Deleted

## 2013-06-28 ENCOUNTER — Encounter (HOSPITAL_COMMUNITY)
Admission: RE | Admit: 2013-06-28 | Discharge: 2013-06-28 | Disposition: A | Discharge status: Home / Self Care | Payer: Medicare Other | Source: Ambulatory Visit | Attending: Rheumatology | Admitting: Rheumatology

## 2013-06-28 DIAGNOSIS — M810 Age-related osteoporosis without current pathological fracture: Secondary | ICD-10-CM | POA: Insufficient documentation

## 2013-06-28 MED ORDER — ZOLEDRONIC ACID 5 MG/100ML IV SOLN
INTRAVENOUS | Status: AC
Start: 2013-06-28 — End: 2013-06-28
  Administered 2013-06-28: 5 mg
  Filled 2013-06-28: qty 100

## 2013-06-28 MED ORDER — ZOLEDRONIC ACID 5 MG/100ML IV SOLN: 5.0000 mg | Freq: Once | INTRAVENOUS | Status: DC

## 2013-07-03 ENCOUNTER — Encounter: Payer: Self-pay | Admitting: Physician Assistant

## 2013-07-03 ENCOUNTER — Ambulatory Visit (INDEPENDENT_AMBULATORY_CARE_PROVIDER_SITE_OTHER): Payer: Medicare Other | Admitting: Physician Assistant

## 2013-07-03 VITALS — BP 124/78 | HR 64 | Ht 65.5 in | Wt 161.1 lb

## 2013-07-03 DIAGNOSIS — I255 Ischemic cardiomyopathy: Secondary | ICD-10-CM

## 2013-07-03 DIAGNOSIS — E785 Hyperlipidemia, unspecified: Secondary | ICD-10-CM

## 2013-07-03 DIAGNOSIS — I10 Essential (primary) hypertension: Secondary | ICD-10-CM

## 2013-07-03 DIAGNOSIS — I2510 Atherosclerotic heart disease of native coronary artery without angina pectoris: Secondary | ICD-10-CM

## 2013-07-03 DIAGNOSIS — I2589 Other forms of chronic ischemic heart disease: Secondary | ICD-10-CM

## 2013-07-03 DIAGNOSIS — J4489 Other specified chronic obstructive pulmonary disease: Secondary | ICD-10-CM

## 2013-07-03 DIAGNOSIS — J449 Chronic obstructive pulmonary disease, unspecified: Secondary | ICD-10-CM

## 2013-07-03 NOTE — Patient Instructions (Signed)
WE WILL REQUEST YOUR RECENT LAB WORK FROM YOUR RHEUMATOLOGIST  Your physician wants you to follow-up in: 6 MONTHS WITH DR. SwazilandJORDAN. You will receive a reminder letter in the mail two months in advance. If you don't receive a letter, please call our office to schedule the follow-up appointment.

## 2013-07-03 NOTE — Progress Notes (Signed)
[address] [place], [place]  [address] Phone: [phone] Fax:  [phone]  Date:  07/03/2013   ID:  [name], DOB [birth year], MRN [id number]  PCP:  Enrique Sack, MD  Cardiologist:  Dr. Peter Swaziland     History of Present Illness: [name] is a 73 y.o. female with a history of CAD, s/p anterior MI in 08/2009 complicated by VFib arrest treated with BMS to the LAD, ischemic cardiomyopathy, HTN, HL, COPD.  EF was originally 40-45% and improved to normal by last echocardiogram. Last LHC in 11/2012 demonstrated a patent stent in LAD and stable 70% stenosis in the RI branch of the circumflex. Medical therapy was continued.  Last seen by Dr. Swaziland 01/2013. She had recently been seen for increased dyspnea. Given her stable findings on extensive cardiac evaluation, Dr. Swaziland felt that her dyspnea was likely related to COPD.  She has been doing well since last seen. She continues to do water aerobics. She denies significant dyspnea. She denies chest pain. She denies syncope. She denies orthopnea, PND or edema.   Studies:  - LHC (11/15/12):  mLAD stent ok with 20% ISR, CFX intermediate branch 70%, mRCA 20-30%, EF 65%.  Med Rx.,  - Echo (03/05/12):  Mild LVH, EF 55%, Gr 1 DD, mild LAE, PASP 30 mmHg.  - Nuclear (03/08/12):  No ischemia, EF 67%   Recent Labs: 11/15/2012: ALT 18; TSH 2.655  11/17/2012: Hemoglobin 14.2  11/22/2012: Creatinine 0.6; Potassium 5.1 06/13/2013:  Na 133, K 4.1, Creatinine 0.64, AST 19, ALT 17   Wt Readings from Last 3 Encounters:  07/03/13 161 lb 1.9 oz (73.084 kg)  06/28/13 160 lb (72.576 kg)  01/25/13 162 lb (73.483 kg)     Past Medical History  Diagnosis Date  . Coronary artery disease 08-14-09    a. 08/2009: Ant MI c/b v-fib arrest, s/p PTCA/BMS to LAD.  Marland Kitchen Ventricular fibrillation     a. 08/2009: due to anterior MI.  Marland Kitchen GERD (gastroesophageal reflux disease)   . Osteoarthritis   . Osteopenia   . Rib fractures     left sided second and  sixth rib   . Compression fracture     T9  . PONV (postoperative nausea and vomiting)   . Ischemic cardiomyopathy     a. EF 40-45% in 08/2009 at time of MI. b. Improved to 55-60% by June 2011.  Marland Kitchen Postoperative groin pseudoaneurysm     a. 04/2010 following diagnostic cardiac cath, s/p compression.  Marland Kitchen COPD (chronic obstructive pulmonary disease)   . HTN (hypertension)   . Hyperlipidemia     Current Outpatient Prescriptions  Medication Sig Dispense Refill  . aspirin 81 MG EC tablet Take 81 mg by mouth daily.        . Calcium Carbonate-Vitamin D 600-400 MG-UNIT per tablet Take 1 tablet by mouth 2 (two) times daily.       . diclofenac sodium (VOLTAREN) 1 % GEL Apply 2 g topically daily as needed. For pain in hands      . losartan (COZAAR) 50 MG tablet Take 1 tablet (50 mg total) by mouth daily.  30 tablet  3  . metoprolol succinate (TOPROL-XL) 25 MG 24 hr tablet TAKE 1 TABLET DAILY  90 tablet  0  . mometasone-formoterol (DULERA) 100-5 MCG/ACT AERO Inhale 2 puffs into the lungs 2 (two) times daily.  1 Inhaler  0  . Multiple Vitamins-Minerals (QC WOMENS DAILY MULTIVITAMIN) TABS Take by mouth daily. with iron       .  nitroGLYCERIN (NITROSTAT) 0.4 MG SL tablet Place 0.4 mg under the tongue every 5 (five) minutes as needed. For chest pain      . pantoprazole (PROTONIX) 40 MG tablet Take 1 tablet (40 mg total) by mouth daily.  90 tablet  3  . rosuvastatin (CRESTOR) 20 MG tablet Take 1 tablet (20 mg total) by mouth at bedtime.  90 tablet  3  . traMADol (ULTRAM) 50 MG tablet Take 1 tablet (50 mg total) by mouth every 6 (six) hours as needed for pain.  20 tablet  0  . Zoledronic Acid (RECLAST [place]) Inject into the vein. Once Yearly       No current facility-administered medications for this visit.    Allergies:   Codeine; Imdur; and Sulfonamide derivatives   Social History:  The patient  reports that she has quit smoking. She has never used smokeless tobacco. She reports that she drinks alcohol. She  reports that she does not use illicit drugs.   Family History:  The patient's family history is not on file.   ROS:  Please see the history of present illness.   She had recent bleeding noted from a varicose vein in her left lower extremity.   All other systems reviewed and negative.   PHYSICAL EXAM: VS:  BP 124/78  Pulse 64  Ht 5' 5.5" (1.664 m)  Wt 161 lb 1.9 oz (73.084 kg)  BMI 26.39 kg/m2 Well nourished, well developed, in no acute distress HEENT: normal Neck: no JVD Cardiac:  normal S1, S2; RRR; no murmur Lungs:  clear to auscultation bilaterally, no wheezing, rhonchi or rales Abd: soft, nontender, no hepatomegaly Ext: trace bilateral LE edema Skin: warm and dry Neuro:  CNs 2-12 intact, no focal abnormalities noted  EKG:  NSR, HR 66, LAD, low voltage, no significant change     ASSESSMENT AND PLAN:  1. CAD:  No angina. Continue aspirin, statin. 2. Ischemic Cardiomyopathy:  EF has recovered to normal.  Continue current dose of beta blocker and ARB. 3. COPD: Continue current regimen and follow up with primary care. 4. Hypertension: Controlled. 5. Hyperlipidemia: Continue statin. 6. Disposition: Follow up with Dr. SwazilandJordan in 6 months.  Signed, Tereso NewcomerScott Isak Sotomayor, PA-C  07/03/2013 11:23 AM

## 2013-07-05 ENCOUNTER — Other Ambulatory Visit: Payer: Self-pay | Admitting: Cardiology

## 2013-08-08 ENCOUNTER — Encounter (HOSPITAL_COMMUNITY): Payer: Self-pay | Admitting: Emergency Medicine

## 2013-08-08 ENCOUNTER — Emergency Department (HOSPITAL_COMMUNITY)
Admission: EM | Admit: 2013-08-08 | Discharge: 2013-08-08 | Disposition: A | Discharge status: Home / Self Care | Payer: Medicare Other | Attending: Emergency Medicine | Admitting: Emergency Medicine

## 2013-08-08 DIAGNOSIS — J4489 Other specified chronic obstructive pulmonary disease: Secondary | ICD-10-CM | POA: Insufficient documentation

## 2013-08-08 DIAGNOSIS — M899 Disorder of bone, unspecified: Secondary | ICD-10-CM | POA: Insufficient documentation

## 2013-08-08 DIAGNOSIS — E785 Hyperlipidemia, unspecified: Secondary | ICD-10-CM | POA: Insufficient documentation

## 2013-08-08 DIAGNOSIS — M1990 Unspecified osteoarthritis, unspecified site: Secondary | ICD-10-CM | POA: Insufficient documentation

## 2013-08-08 DIAGNOSIS — I4901 Ventricular fibrillation: Secondary | ICD-10-CM | POA: Insufficient documentation

## 2013-08-08 DIAGNOSIS — J449 Chronic obstructive pulmonary disease, unspecified: Secondary | ICD-10-CM | POA: Insufficient documentation

## 2013-08-08 DIAGNOSIS — Z7982 Long term (current) use of aspirin: Secondary | ICD-10-CM | POA: Insufficient documentation

## 2013-08-08 DIAGNOSIS — N390 Urinary tract infection, site not specified: Secondary | ICD-10-CM | POA: Insufficient documentation

## 2013-08-08 DIAGNOSIS — Z87891 Personal history of nicotine dependence: Secondary | ICD-10-CM | POA: Insufficient documentation

## 2013-08-08 DIAGNOSIS — Z888 Allergy status to other drugs, medicaments and biological substances status: Secondary | ICD-10-CM | POA: Insufficient documentation

## 2013-08-08 DIAGNOSIS — I10 Essential (primary) hypertension: Secondary | ICD-10-CM | POA: Insufficient documentation

## 2013-08-08 DIAGNOSIS — R350 Frequency of micturition: Secondary | ICD-10-CM | POA: Insufficient documentation

## 2013-08-08 DIAGNOSIS — I2510 Atherosclerotic heart disease of native coronary artery without angina pectoris: Secondary | ICD-10-CM | POA: Insufficient documentation

## 2013-08-08 DIAGNOSIS — Z882 Allergy status to sulfonamides status: Secondary | ICD-10-CM | POA: Insufficient documentation

## 2013-08-08 DIAGNOSIS — Z79899 Other long term (current) drug therapy: Secondary | ICD-10-CM | POA: Insufficient documentation

## 2013-08-08 DIAGNOSIS — K219 Gastro-esophageal reflux disease without esophagitis: Secondary | ICD-10-CM | POA: Insufficient documentation

## 2013-08-08 DIAGNOSIS — I2589 Other forms of chronic ischemic heart disease: Secondary | ICD-10-CM | POA: Insufficient documentation

## 2013-08-08 DIAGNOSIS — M949 Disorder of cartilage, unspecified: Secondary | ICD-10-CM

## 2013-08-08 LAB — COMPREHENSIVE METABOLIC PANEL
ALT: 18 U/L (ref 0–35)
AST: 24 U/L (ref 0–37)
Albumin: 3.5 g/dL (ref 3.5–5.2)
Alkaline Phosphatase: 59 U/L (ref 39–117)
BUN: 13 mg/dL (ref 6–23)
CO2: 27 mEq/L (ref 19–32)
Calcium: 9.1 mg/dL (ref 8.4–10.5)
Chloride: 98 mEq/L (ref 96–112)
Creatinine, Ser: 0.66 mg/dL (ref 0.50–1.10)
GFR calc Af Amer: >90 mL/min (ref >90)
GFR calc non Af Amer: 86 mL/min — ABNORMAL LOW (ref >90)
Glucose, Bld: 137 mg/dL — ABNORMAL HIGH (ref 70–99)
Potassium: 5 mEq/L (ref 3.7–5.3)
Sodium: 136 mEq/L — ABNORMAL LOW (ref 137–147)
Total Bilirubin: 0.5 mg/dL (ref 0.3–1.2)
Total Protein: 6.9 g/dL (ref 6.0–8.3)

## 2013-08-08 LAB — URINALYSIS, ROUTINE W REFLEX MICROSCOPIC
Bilirubin Urine: NEGATIVE
Glucose, UA: NEGATIVE mg/dL
Ketones, ur: NEGATIVE mg/dL
Nitrite: POSITIVE — AB
Protein, ur: >300 mg/dL — AB
Specific Gravity, Urine: 1.016 (ref 1.005–1.030)
Urobilinogen, UA: 0.2 mg/dL (ref 0.0–1.0)
pH: 6.5 (ref 5.0–8.0)

## 2013-08-08 LAB — CBC WITH DIFFERENTIAL/PLATELET
Basophils Absolute: 0 10*3/uL (ref 0.0–0.1)
Basophils Relative: 0 % (ref 0–1)
Eosinophils Absolute: 0 10*3/uL (ref 0.0–0.7)
Eosinophils Relative: 0 % (ref 0–5)
HCT: 39.2 % (ref 36.0–46.0)
Hemoglobin: 13.7 g/dL (ref 12.0–15.0)
Lymphocytes Relative: 7 % — ABNORMAL LOW (ref 12–46)
Lymphs Abs: 0.7 10*3/uL (ref 0.7–4.0)
MCH: 34.9 pg — ABNORMAL HIGH (ref 26.0–34.0)
MCHC: 34.9 g/dL (ref 30.0–36.0)
MCV: 100 fL (ref 78.0–100.0)
Monocytes Absolute: 0.7 10*3/uL (ref 0.1–1.0)
Monocytes Relative: 7 % (ref 3–12)
Neutro Abs: 8.4 10*3/uL — ABNORMAL HIGH (ref 1.7–7.7)
Neutrophils Relative %: 86 % — ABNORMAL HIGH (ref 43–77)
Platelets: 268 10*3/uL (ref 150–400)
RBC: 3.92 MIL/uL (ref 3.87–5.11)
RDW: 12.6 % (ref 11.5–15.5)
WBC: 9.8 10*3/uL (ref 4.0–10.5)

## 2013-08-08 LAB — URINE MICROSCOPIC-ADD ON

## 2013-08-08 MED ORDER — PHENAZOPYRIDINE HCL 200 MG PO TABS: 200.0000 mg | ORAL_TABLET | Freq: Three times a day (TID) | ORAL | Status: DC | PRN

## 2013-08-08 MED ORDER — CEPHALEXIN 250 MG PO CAPS
500.0000 mg | ORAL_CAPSULE | Freq: Once | ORAL | Status: AC
  Administered 2013-08-08: 500 mg via ORAL
  Filled 2013-08-08: qty 2

## 2013-08-08 MED ORDER — CEPHALEXIN 500 MG PO CAPS: 500.0000 mg | ORAL_CAPSULE | Freq: Once | ORAL | Status: DC

## 2013-08-08 MED ORDER — PHENAZOPYRIDINE HCL 100 MG PO TABS
200.0000 mg | ORAL_TABLET | Freq: Once | ORAL | Status: AC
  Administered 2013-08-08: 200 mg via ORAL
  Filled 2013-08-08: qty 2

## 2013-08-08 NOTE — ED Notes (Signed)
Pt presents with dysuria, frequency, and pain with urination x3 weeks, pt took nitrofurantoin and Cipro that helped until she completed her doses, pt reports increase in her symptoms and a pain to her Right flank x1 last night, and woke with nausea and vomiting this am.

## 2013-08-08 NOTE — ED Notes (Signed)
Pt. reports urinary frequency , dysuria , right low back pain and cloudy urine onset last night . Pt. completed her last dose of oral antibiotic for UTI yesterday .

## 2013-08-08 NOTE — ED Provider Notes (Signed)
CSN: [id number]     Arrival date & time 08/08/13  0608 History   First MD Initiated Contact with Patient 08/08/13 0715     Chief Complaint  Patient presents with  . Dysuria  . Urinary Frequency     (Consider location/radiation/quality/duration/timing/severity/associated sxs/prior Treatment) Patient is a 73 y.o. female presenting with dysuria and frequency. The history is provided by the patient.  Dysuria Urinary Frequency   She presents for evaluation of dysuria and urinary frequency. That started last night. It is recurrent. She finished a course of nitrofurantoin yesterday. She had some nausea and vomiting without diarrhea. She's not had fever or chills. She denies weakness, or dizziness. She has mild, right flank pain that, has resolved. There are no other known modifying factors.  Past Medical History  Diagnosis Date  . Coronary artery disease 08-14-09    a. 08/2009: Ant MI c/b v-fib arrest, s/p PTCA/BMS to LAD.  Marland Kitchen Ventricular fibrillation     a. 08/2009: due to anterior MI.  Marland Kitchen GERD (gastroesophageal reflux disease)   . Osteoarthritis   . Osteopenia   . Rib fractures     left sided second and sixth rib   . Compression fracture     T9  . PONV (postoperative nausea and vomiting)   . Ischemic cardiomyopathy     a. EF 40-45% in 08/2009 at time of MI. b. Improved to 55-60% by June 2011.  Marland Kitchen Postoperative groin pseudoaneurysm     a. 04/2010 following diagnostic cardiac cath, s/p compression.  Marland Kitchen COPD (chronic obstructive pulmonary disease)   . HTN (hypertension)   . Hyperlipidemia    Past Surgical History  Procedure Laterality Date  . Tonsillectomy    . Appendectomy    . Orif hip fracture    . Leg surgery      R low  . Back surgery     No family history on file. History  Substance Use Topics  . Smoking status: Former Smoker -- 20 years  . Smokeless tobacco: Never Used  . Alcohol Use: Yes     Comment: No history of alcohol abuse   OB History   Grav Para Term Preterm  Abortions TAB SAB Ect Mult Living                 Review of Systems  Genitourinary: Positive for dysuria and frequency.  All other systems reviewed and are negative.     Allergies  Codeine; Imdur; and Sulfonamide derivatives  Home Medications   Prior to Admission medications   Medication Sig Start Date End Date Taking? Authorizing Provider  aspirin 81 MG EC tablet Take 81 mg by mouth daily.     Yes Historical Provider, MD  Calcium Carbonate-Vitamin D 600-400 MG-UNIT per tablet Take 1 tablet by mouth 2 (two) times daily.    Yes Historical Provider, MD  losartan (COZAAR) 50 MG tablet Take 1 tablet (50 mg total) by mouth daily. 11/17/12  Yes Dayna N Dunn, PA-C  metoprolol succinate (TOPROL-XL) 25 MG 24 hr tablet Take 25 mg by mouth daily.   Yes Historical Provider, MD  mometasone-formoterol (DULERA) 100-5 MCG/ACT AERO Inhale 2 puffs into the lungs 2 (two) times daily. 11/17/12  Yes Dayna N Dunn, PA-C  Multiple Vitamins-Minerals (QC WOMENS DAILY MULTIVITAMIN) TABS Take by mouth daily. with iron    Yes Historical Provider, MD  nitroGLYCERIN (NITROSTAT) 0.4 MG SL tablet Place 0.4 mg under the tongue every 5 (five) minutes as needed. For chest pain 01/19/12  Yes  Herby Abrahamhomas D Stuckey, MD  pantoprazole (PROTONIX) 40 MG tablet Take 40 mg by mouth daily.   Yes Historical Provider, MD  rosuvastatin (CRESTOR) 20 MG tablet Take 1 tablet (20 mg total) by mouth at bedtime. 07/29/12  Yes Herby Abrahamhomas D Stuckey, MD  traMADol (ULTRAM) 50 MG tablet Take 1 tablet (50 mg total) by mouth every 6 (six) hours as needed for pain. 03/06/12  Yes Rhonda G Barrett, PA-C  Zoledronic Acid (RECLAST IV) Inject into the vein. Once Yearly   Yes Historical Provider, MD  cephALEXin (KEFLEX) 500 MG capsule Take 1 capsule (500 mg total) by mouth once. 08/08/13   Flint MelterElliott L Chi Garlow, MD  phenazopyridine (PYRIDIUM) 200 MG tablet Take 1 tablet (200 mg total) by mouth 3 (three) times daily as needed (dysuria). 08/08/13   Flint MelterElliott L Lucille Crichlow, MD   BP  122/69  Pulse 68  Temp(Src) 97.9 F (36.6 C) (Oral)  Resp 16  Ht 5\' 5"  (1.651 m)  Wt 160 lb (72.576 kg)  BMI 26.63 kg/m2  SpO2 97% Physical Exam  Nursing note and vitals reviewed. Constitutional: She is oriented to person, place, and time. She appears well-developed and well-nourished.  HENT:  Head: Normocephalic and atraumatic.  Eyes: Conjunctivae and EOM are normal. Pupils are equal, round, and reactive to light.  Neck: Normal range of motion and phonation normal. Neck supple.  Cardiovascular: Normal rate, regular rhythm and intact distal pulses.   Pulmonary/Chest: Effort normal and breath sounds normal. She exhibits no tenderness.  Abdominal: Soft. She exhibits no distension. There is no tenderness. There is no guarding.  Genitourinary:  Mild right costovertebral angle tenderness to percussion  Musculoskeletal: Normal range of motion.  Neurological: She is alert and oriented to person, place, and time. She exhibits normal muscle tone.  Skin: Skin is warm and dry.  Psychiatric: She has a normal mood and affect. Her behavior is normal. Judgment and thought content normal.    ED Course  Procedures (including critical care time)   Medications  cephALEXin (KEFLEX) capsule 500 mg (500 mg Oral Given 08/08/13 0833)  phenazopyridine (PYRIDIUM) tablet 200 mg (200 mg Oral Given 08/08/13 0833)    Patient Vitals for the past 24 hrs:  BP Temp Temp src Pulse Resp SpO2 Height Weight  08/08/13 0732 122/69 mmHg 97.9 F (36.6 C) Oral 68 16 97 % - -  08/08/13 0634 109/70 mmHg 98.5 F (36.9 C) Oral 88 - 98 % 5\' 5"  (1.651 m) 160 lb (72.576 kg)    9:05 AM Reevaluation with update and discussion. After initial assessment and treatment, an updated evaluation reveals she feels better. Findings discussed with patient and husband, all questions answered. Flint MelterElliott L Esmond Hinch     Labs Review Labs Reviewed  URINALYSIS, ROUTINE W REFLEX MICROSCOPIC - Abnormal; Notable for the following:    APPearance  TURBID (*)    Hgb urine dipstick LARGE (*)    Protein, ur >300 (*)    Nitrite POSITIVE (*)    Leukocytes, UA LARGE (*)    All other components within normal limits  CBC WITH DIFFERENTIAL - Abnormal; Notable for the following:    MCH 34.9 (*)    Neutrophils Relative % 86 (*)    Neutro Abs 8.4 (*)    Lymphocytes Relative 7 (*)    All other components within normal limits  COMPREHENSIVE METABOLIC PANEL - Abnormal; Notable for the following:    Sodium 136 (*)    Glucose, Bld 137 (*)    GFR calc non Af  Amer 7886 (*)    All other components within normal limits  URINE MICROSCOPIC-ADD ON - Abnormal; Notable for the following:    Bacteria, UA MANY (*)    All other components within normal limits    Imaging Review No results found.   EKG Interpretation None      MDM   Final diagnoses:  UTI (urinary tract infection)    UA tract infection with mild symptoms. Possible early, pyelonephritis, but no severe systemic symptoms. Doubt metabolic instability, sepsis or impending vascular, collapse.  Nursing Notes Reviewed/ Care Coordinated Applicable Imaging Reviewed Interpretation of Laboratory Data incorporated into ED treatment  The patient appears reasonably screened and/or stabilized for discharge and I doubt any other medical condition or other [facility] requiring further screening, evaluation, or treatment in the ED at this time prior to discharge.  Plan: Home Medications- Keflex, Pyridium; Home Treatments- Fluids; return here if the recommended treatment, does not improve the symptoms; Recommended follow up- PCP 1 week     Flint MelterElliott L Keylani Perlstein, MD 08/08/13 [phone]

## 2013-08-08 NOTE — ED Notes (Signed)
Pt given saltine crackers and water per EDP Wentz request to give before giving pt the ABX

## 2013-08-08 NOTE — Discharge Instructions (Signed)
Take plenty of fluids Take Tylenol for pain or fever Return here, if needed, for problems    Urinary Tract Infection Urinary tract infections (UTIs) can develop anywhere along your urinary tract. Your urinary tract is your body's drainage system for removing wastes and extra water. Your urinary tract includes two kidneys, two ureters, a bladder, and a urethra. Your kidneys are a pair of bean-shaped organs. Each kidney is about the size of your fist. They are located below your ribs, one on each side of your spine. CAUSES Infections are caused by microbes, which are microscopic organisms, including fungi, viruses, and bacteria. These organisms are so small that they can only be seen through a microscope. Bacteria are the microbes that most commonly cause UTIs. SYMPTOMS  Symptoms of UTIs may vary by age and gender of the patient and by the location of the infection. Symptoms in young women typically include a frequent and intense urge to urinate and a painful, burning feeling in the bladder or urethra during urination. Older women and men are more likely to be tired, shaky, and weak and have muscle aches and abdominal pain. A fever may mean the infection is in your kidneys. Other symptoms of a kidney infection include pain in your back or sides below the ribs, nausea, and vomiting. DIAGNOSIS To diagnose a UTI, your caregiver will ask you about your symptoms. Your caregiver also will ask to provide a urine sample. The urine sample will be tested for bacteria and white blood cells. White blood cells are made by your body to help fight infection. TREATMENT  Typically, UTIs can be treated with medication. Because most UTIs are caused by a bacterial infection, they usually can be treated with the use of antibiotics. The choice of antibiotic and length of treatment depend on your symptoms and the type of bacteria causing your infection. HOME CARE INSTRUCTIONS  If you were prescribed antibiotics, take them  exactly as your caregiver instructs you. Finish the medication even if you feel better after you have only taken some of the medication.  Drink enough water and fluids to keep your urine clear or pale yellow.  Avoid caffeine, tea, and carbonated beverages. They tend to irritate your bladder.  Empty your bladder often. Avoid holding urine for long periods of time.  Empty your bladder before and after sexual intercourse.  After a bowel movement, women should cleanse from front to back. Use each tissue only once. SEEK MEDICAL CARE IF:   You have back pain.  You develop a fever.  Your symptoms do not begin to resolve within 3 days. SEEK IMMEDIATE MEDICAL CARE IF:   You have severe back pain or lower abdominal pain.  You develop chills.  You have nausea or vomiting.  You have continued burning or discomfort with urination. MAKE SURE YOU:   Understand these instructions.  Will watch your condition.  Will get help right away if you are not doing well or get worse. Document Released: 12/31/2004 Document Revised: 09/22/2011 Document Reviewed: 05/01/2011 [facility] Patient Information 2014 [employer] [place].

## 2013-08-08 NOTE — ED Notes (Signed)
Pt discharged home with all belongings, pt alert, oriented, and ambulatory upon discharge, 2 new RX prescribed, pt verbalizes understanding of discharge instructions, pt driven home by spouse

## 2013-08-10 ENCOUNTER — Telehealth (HOSPITAL_BASED_OUTPATIENT_CLINIC_OR_DEPARTMENT_OTHER): Payer: Self-pay

## 2013-08-10 NOTE — Telephone Encounter (Signed)
Pt calling was prescribed Keflex but instructions stated take 1 capsule by mouth once.  Directions clarified w/J. Emmit AlexandersGeiple PA instructions should be "Take 1 capsule by mouth 4 time daily x 7 days".  Pt informed.

## 2013-08-22 ENCOUNTER — Other Ambulatory Visit: Payer: Self-pay | Admitting: Cardiology

## 2013-08-27 ENCOUNTER — Other Ambulatory Visit: Payer: Self-pay | Admitting: Cardiology

## 2013-10-28 ENCOUNTER — Other Ambulatory Visit: Payer: Self-pay | Admitting: Cardiology

## 2013-10-30 NOTE — Telephone Encounter (Signed)
metoprolol succinate (TOPROL-XL) 25 MG 24 hr tablet  Take 1 tablet (25 mg total) by mouth daily.

## 2013-12-09 ENCOUNTER — Other Ambulatory Visit: Payer: Self-pay | Admitting: Cardiology

## 2014-02-02 ENCOUNTER — Other Ambulatory Visit: Payer: Self-pay | Admitting: Cardiology

## 2014-03-09 ENCOUNTER — Other Ambulatory Visit: Payer: Self-pay | Admitting: Cardiology

## 2014-03-10 NOTE — Telephone Encounter (Signed)
Rx was sent to pharmacy electronically. 

## 2014-03-15 ENCOUNTER — Encounter (HOSPITAL_COMMUNITY): Payer: Self-pay | Admitting: Cardiovascular Disease

## 2014-03-15 ENCOUNTER — Ambulatory Visit
Admission: RE | Admit: 2014-03-15 | Discharge: 2014-03-15 | Disposition: A | Discharge status: Home / Self Care | Payer: Medicare Other | Source: Ambulatory Visit | Attending: Internal Medicine | Admitting: Internal Medicine

## 2014-03-15 ENCOUNTER — Other Ambulatory Visit: Payer: Self-pay | Admitting: Internal Medicine

## 2014-03-15 DIAGNOSIS — W19XXXA Unspecified fall, initial encounter: Secondary | ICD-10-CM

## 2014-05-03 ENCOUNTER — Other Ambulatory Visit: Payer: Self-pay | Admitting: Cardiology

## 2014-05-16 ENCOUNTER — Other Ambulatory Visit: Payer: Self-pay | Admitting: Internal Medicine

## 2014-05-16 DIAGNOSIS — R1902 Left upper quadrant abdominal swelling, mass and lump: Secondary | ICD-10-CM

## 2014-05-18 ENCOUNTER — Ambulatory Visit
Admission: RE | Admit: 2014-05-18 | Discharge: 2014-05-18 | Disposition: A | Discharge status: Home / Self Care | Payer: Medicare Other | Source: Ambulatory Visit | Attending: Internal Medicine | Admitting: Internal Medicine

## 2014-05-18 DIAGNOSIS — R1902 Left upper quadrant abdominal swelling, mass and lump: Secondary | ICD-10-CM

## 2014-05-18 MED ORDER — IOHEXOL 300 MG/ML  SOLN
100.0000 mL | Freq: Once | INTRAMUSCULAR | Status: AC | PRN
  Administered 2014-05-18: 100 mL via INTRAVENOUS

## 2014-05-22 ENCOUNTER — Other Ambulatory Visit: Payer: Self-pay | Admitting: Cardiology

## 2014-07-04 ENCOUNTER — Other Ambulatory Visit: Payer: Self-pay | Admitting: Cardiology

## 2014-07-09 ENCOUNTER — Other Ambulatory Visit (HOSPITAL_COMMUNITY): Payer: Self-pay | Admitting: *Deleted

## 2014-07-10 ENCOUNTER — Ambulatory Visit (HOSPITAL_COMMUNITY)
Admission: RE | Admit: 2014-07-10 | Discharge: 2014-07-10 | Disposition: A | Discharge status: Home / Self Care | Payer: Medicare Other | Source: Ambulatory Visit | Attending: Rheumatology | Admitting: Rheumatology

## 2014-07-10 DIAGNOSIS — Z87311 Personal history of (healed) other pathological fracture: Secondary | ICD-10-CM | POA: Insufficient documentation

## 2014-07-10 DIAGNOSIS — M810 Age-related osteoporosis without current pathological fracture: Secondary | ICD-10-CM | POA: Insufficient documentation

## 2014-07-10 MED ORDER — ZOLEDRONIC ACID 5 MG/100ML IV SOLN
INTRAVENOUS | Status: AC
  Administered 2014-07-10: 5 mg via INTRAVENOUS
  Filled 2014-07-10: qty 100

## 2014-07-10 MED ORDER — ZOLEDRONIC ACID 5 MG/100ML IV SOLN
5.0000 mg | Freq: Once | INTRAVENOUS | Status: AC
  Administered 2014-07-10: 5 mg via INTRAVENOUS

## 2014-07-10 NOTE — Discharge Instructions (Signed)

## 2014-07-30 ENCOUNTER — Other Ambulatory Visit: Payer: Self-pay | Admitting: Cardiology

## 2014-08-14 NOTE — Progress Notes (Signed)
08/15/2014 [name]   09/27/1940  [id number]  Primary Physician GREEN, Lorenda [name], MD Primary Cardiologist: Dr. SwazilandJordan  Reason for visit/CC: Routine yearly follow-up for CAD  HPI:  The patient is a 74 year old female  previously followed by Dr. Riley KillStuckey but now followed by Dr. SwazilandJordan. She presents to clinic today for routine yearly follow-up. She has a history of coronary disease. 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 She had stenting of the LAD with a bare-metal stent. Initial ejection fraction was 40-45%. This improved to 55% by echo in November 2013. She was admitted in August 2014 with symptoms of nausea vomiting and chest pain. Cardiac catheterization demonstrated continued patency of the stent in the mid LAD. There is a 70% stenosis in the ramus intermediate branch which was unchanged. Otherwise nonobstructive disease. Ejection fraction was 65%. EDP was 9 mmHg. She was treated medically. She also has a history of hypertension, hyperlipidemia and COPD. She has a past history of tobacco use. Quit 30 years ago.   She reports that she has done well since her last visit. No chest pain or dyspnea. No limitations with exertional activities. No syncope/ near syncope. Compliant with medications.    Current Outpatient Prescriptions  Medication Sig Dispense Refill  . aspirin 81 MG EC tablet Take 81 mg by mouth daily.      . calcium carbonate (OS-CAL) 600 MG TABS tablet Take 600 mg by mouth daily with breakfast.    . CRESTOR 20 MG tablet TAKE 1 TABLET AT BEDTIME (NEEDS TO CALL FOR APPOINTMENT) 90 tablet 0  . losartan (COZAAR) 50 MG tablet Take 1 tablet (50 mg total) by mouth daily. 30 tablet 3  . metoprolol succinate (TOPROL-XL) 25 MG 24 hr tablet TAKE 1 TABLET DAILY 90 tablet 0  . mometasone-formoterol (DULERA) 100-5 MCG/ACT AERO Inhale 2 puffs into the lungs 2 (two) times daily. (Patient taking differently: Inhale 2 puffs into  the lungs as needed. ) 1 Inhaler 0  . Multiple Vitamins-Minerals (QC WOMENS DAILY MULTIVITAMIN) TABS Take by mouth daily. with iron     . nitroGLYCERIN (NITROSTAT) 0.4 MG SL tablet Place 0.4 mg under the tongue every 5 (five) minutes as needed. For chest pain    . pantoprazole (PROTONIX) 40 MG tablet TAKE 1 TABLET DAILY 90 tablet 0  . Zoledronic Acid (RECLAST IV) Inject into the vein. Once Yearly     No current facility-administered medications for this visit.    Allergies  Allergen Reactions  . Codeine Nausea And Vomiting  . Imdur [Isosorbide Dinitrate] Nausea Only    Headache  . Sulfonamide Derivatives Nausea And Vomiting    History   Social History  . Marital Status: Married    Spouse Name: N/A  . Number of Children: N/A  . Years of Education: N/A   Occupational History  . retired Clinical research associatewriter    Social History Main Topics  . Smoking status: Former Smoker -- 20 years  . Smokeless tobacco: Never Used  . Alcohol Use: Yes     Comment: No history of alcohol abuse  . Drug Use: No  . Sexual Activity: No   Other Topics Concern  . Not on file   Social History Narrative     Review of Systems: General: negative for chills, fever, night sweats or weight changes.  Cardiovascular: negative for chest pain, dyspnea on exertion, edema, orthopnea, palpitations, paroxysmal nocturnal dyspnea or shortness of breath Dermatological: negative for rash Respiratory:  negative for cough or wheezing Urologic: negative for hematuria Abdominal: negative for nausea, vomiting, diarrhea, bright red blood per rectum, melena, or hematemesis Neurologic: negative for visual changes, syncope, or dizziness All other systems reviewed and are otherwise negative except as noted above.    Blood pressure 112/62, pulse 59, height 5' 5.5" (1.664 m), weight 161 lb 3.2 oz (73.12 kg).  General appearance: alert, cooperative and no distress Neck: no carotid bruit and no JVD Lungs: clear to auscultation  bilaterally Heart: regular rate and rhythm, S1, S2 normal, no murmur, click, rub or gallop Extremities: no LEE Pulses: 2+ and symmetric Skin: warm and dry Neurologic: Grossly normal  EKG NSR with n/s T wave abnormalities  ASSESSMENT AND PLAN:   1. CAD: prior anterior myocardial infarction in 2011. Status post bare-metal stent to the LAD. Patent stent and stable CAD on repeat cath in 2014. No recurrent CP or dyspnea. Continue medical therapy with ASA, BB, ARB and statin.  2. Hyperlipidemia: Followed by PCP. Continue statin therapy with crestor.   3. HTN: well controlled on current regimen  PLAN  recommend follow-up with Dr. SwazilandJordan in 6 months  [name], [facility] Hopkins HospitalBRITTAINYPA-C 08/15/2014 10:18 AM

## 2014-08-15 ENCOUNTER — Ambulatory Visit (INDEPENDENT_AMBULATORY_CARE_PROVIDER_SITE_OTHER): Payer: Medicare Other | Admitting: Cardiology

## 2014-08-15 ENCOUNTER — Encounter: Payer: Self-pay | Admitting: Cardiology

## 2014-08-15 VITALS — BP 112/62 | HR 59 | Ht 65.5 in | Wt 161.2 lb

## 2014-08-15 DIAGNOSIS — I25119 Atherosclerotic heart disease of native coronary artery with unspecified angina pectoris: Secondary | ICD-10-CM | POA: Diagnosis not present

## 2014-08-15 MED ORDER — NITROGLYCERIN 0.4 MG SL SUBL: 0.4000 mg | SUBLINGUAL_TABLET | SUBLINGUAL | Status: DC | PRN

## 2014-08-15 NOTE — Patient Instructions (Signed)
Your physician wants you to follow-up in: SIX MONTHS WITH DR.JORDAN. You will receive a reminder letter in the mail two months in advance. If you don't receive a letter, please call our office to schedule the follow-up appointment.

## 2014-08-20 ENCOUNTER — Other Ambulatory Visit: Payer: Self-pay | Admitting: Cardiology

## 2014-10-01 ENCOUNTER — Other Ambulatory Visit: Payer: Self-pay | Admitting: Cardiology

## 2014-10-01 ENCOUNTER — Other Ambulatory Visit: Payer: Self-pay

## 2014-10-29 ENCOUNTER — Other Ambulatory Visit: Payer: Self-pay | Admitting: Cardiology

## 2015-01-28 ENCOUNTER — Other Ambulatory Visit: Payer: Self-pay | Admitting: *Deleted

## 2015-01-28 MED ORDER — ROSUVASTATIN CALCIUM 20 MG PO TABS: ORAL_TABLET | ORAL | Status: DC

## 2015-02-15 ENCOUNTER — Other Ambulatory Visit: Payer: Self-pay | Admitting: Cardiology

## 2015-03-21 ENCOUNTER — Encounter (HOSPITAL_COMMUNITY): Payer: Self-pay | Admitting: *Deleted

## 2015-03-21 ENCOUNTER — Inpatient Hospital Stay (HOSPITAL_COMMUNITY)
Admission: EM | Admit: 2015-03-21 | Discharge: 2015-03-23 | DRG: 690 | Disposition: A | Discharge status: Home Health | Payer: Medicare Other | Attending: Internal Medicine | Admitting: Internal Medicine

## 2015-03-21 ENCOUNTER — Emergency Department (HOSPITAL_COMMUNITY): Payer: Medicare Other

## 2015-03-21 ENCOUNTER — Inpatient Hospital Stay (HOSPITAL_COMMUNITY): Payer: Medicare Other

## 2015-03-21 DIAGNOSIS — J449 Chronic obstructive pulmonary disease, unspecified: Secondary | ICD-10-CM | POA: Diagnosis present

## 2015-03-21 DIAGNOSIS — Z7982 Long term (current) use of aspirin: Secondary | ICD-10-CM | POA: Diagnosis not present

## 2015-03-21 DIAGNOSIS — N390 Urinary tract infection, site not specified: Secondary | ICD-10-CM | POA: Diagnosis not present

## 2015-03-21 DIAGNOSIS — I2510 Atherosclerotic heart disease of native coronary artery without angina pectoris: Secondary | ICD-10-CM | POA: Diagnosis present

## 2015-03-21 DIAGNOSIS — K219 Gastro-esophageal reflux disease without esophagitis: Secondary | ICD-10-CM | POA: Diagnosis present

## 2015-03-21 DIAGNOSIS — R0789 Other chest pain: Secondary | ICD-10-CM | POA: Diagnosis not present

## 2015-03-21 DIAGNOSIS — N1330 Unspecified hydronephrosis: Secondary | ICD-10-CM | POA: Diagnosis present

## 2015-03-21 DIAGNOSIS — I5032 Chronic diastolic (congestive) heart failure: Secondary | ICD-10-CM | POA: Diagnosis present

## 2015-03-21 DIAGNOSIS — Q6211 Congenital occlusion of ureteropelvic junction: Secondary | ICD-10-CM

## 2015-03-21 DIAGNOSIS — D539 Nutritional anemia, unspecified: Secondary | ICD-10-CM | POA: Diagnosis present

## 2015-03-21 DIAGNOSIS — Z888 Allergy status to other drugs, medicaments and biological substances status: Secondary | ICD-10-CM

## 2015-03-21 DIAGNOSIS — R809 Proteinuria, unspecified: Secondary | ICD-10-CM | POA: Diagnosis present

## 2015-03-21 DIAGNOSIS — Z79899 Other long term (current) drug therapy: Secondary | ICD-10-CM

## 2015-03-21 DIAGNOSIS — Z9861 Coronary angioplasty status: Secondary | ICD-10-CM | POA: Diagnosis not present

## 2015-03-21 DIAGNOSIS — Z885 Allergy status to narcotic agent status: Secondary | ICD-10-CM

## 2015-03-21 DIAGNOSIS — I252 Old myocardial infarction: Secondary | ICD-10-CM | POA: Diagnosis not present

## 2015-03-21 DIAGNOSIS — N136 Pyonephrosis: Secondary | ICD-10-CM | POA: Diagnosis present

## 2015-03-21 DIAGNOSIS — Z882 Allergy status to sulfonamides status: Secondary | ICD-10-CM

## 2015-03-21 DIAGNOSIS — R079 Chest pain, unspecified: Secondary | ICD-10-CM

## 2015-03-21 DIAGNOSIS — I110 Hypertensive heart disease with heart failure: Secondary | ICD-10-CM | POA: Diagnosis present

## 2015-03-21 DIAGNOSIS — I10 Essential (primary) hypertension: Secondary | ICD-10-CM | POA: Diagnosis present

## 2015-03-21 DIAGNOSIS — M1990 Unspecified osteoarthritis, unspecified site: Secondary | ICD-10-CM | POA: Diagnosis present

## 2015-03-21 LAB — CBC WITH DIFFERENTIAL/PLATELET
Basophils Absolute: 0 10*3/uL (ref 0.0–0.1)
Basophils Relative: 0 %
Eosinophils Absolute: 0.1 10*3/uL (ref 0.0–0.7)
Eosinophils Relative: 1 %
HCT: 40.5 % (ref 36.0–46.0)
Hemoglobin: 13.9 g/dL (ref 12.0–15.0)
Lymphocytes Relative: 4 %
Lymphs Abs: 0.5 10*3/uL — ABNORMAL LOW (ref 0.7–4.0)
MCH: 35.1 pg — ABNORMAL HIGH (ref 26.0–34.0)
MCHC: 34.3 g/dL (ref 30.0–36.0)
MCV: 102.3 fL — ABNORMAL HIGH (ref 78.0–100.0)
Monocytes Absolute: 1 10*3/uL (ref 0.1–1.0)
Monocytes Relative: 8 %
Neutro Abs: 11 10*3/uL — ABNORMAL HIGH (ref 1.7–7.7)
Neutrophils Relative %: 87 %
Platelets: 232 10*3/uL (ref 150–400)
RBC: 3.96 MIL/uL (ref 3.87–5.11)
RDW: 12.7 % (ref 11.5–15.5)
WBC: 12.6 10*3/uL — ABNORMAL HIGH (ref 4.0–10.5)

## 2015-03-21 LAB — URINALYSIS, ROUTINE W REFLEX MICROSCOPIC
Bilirubin Urine: NEGATIVE
Glucose, UA: NEGATIVE mg/dL
Ketones, ur: NEGATIVE mg/dL
Nitrite: NEGATIVE
Protein, ur: >300 mg/dL — AB
Specific Gravity, Urine: 1.013 (ref 1.005–1.030)
pH: 7 (ref 5.0–8.0)

## 2015-03-21 LAB — I-STAT TROPONIN, ED: Troponin i, poc: 0 ng/mL (ref 0.00–0.08)

## 2015-03-21 LAB — COMPREHENSIVE METABOLIC PANEL
ALT: 16 U/L (ref 14–54)
AST: 23 U/L (ref 15–41)
Albumin: 3.6 g/dL (ref 3.5–5.0)
Alkaline Phosphatase: 61 U/L (ref 38–126)
Anion gap: 10 (ref 5–15)
BUN: 16 mg/dL (ref 6–20)
CO2: 25 mmol/L (ref 22–32)
Calcium: 9 mg/dL (ref 8.9–10.3)
Chloride: 102 mmol/L (ref 101–111)
Creatinine, Ser: 0.83 mg/dL (ref 0.44–1.00)
GFR calc Af Amer: >60 mL/min (ref >60)
GFR calc non Af Amer: >60 mL/min (ref >60)
Glucose, Bld: 112 mg/dL — ABNORMAL HIGH (ref 65–99)
Potassium: 4.9 mmol/L (ref 3.5–5.1)
Sodium: 137 mmol/L (ref 135–145)
Total Bilirubin: 0.8 mg/dL (ref 0.3–1.2)
Total Protein: 6.5 g/dL (ref 6.5–8.1)

## 2015-03-21 LAB — RETICULOCYTES
RBC.: 3.44 MIL/uL — ABNORMAL LOW (ref 3.87–5.11)
Retic Count, Absolute: 55 10*3/uL (ref 19.0–186.0)
Retic Ct Pct: 1.6 % (ref 0.4–3.1)

## 2015-03-21 LAB — D-DIMER, QUANTITATIVE (NOT AT ARMC): D-Dimer, Quant: 0.84 ug/mL-FEU — ABNORMAL HIGH (ref 0.00–0.50)

## 2015-03-21 LAB — PROTEIN / CREATININE RATIO, URINE
Creatinine, Urine: 68.68 mg/dL
Protein Creatinine Ratio: 4.37 mg/mg{Cre} — ABNORMAL HIGH (ref 0.00–0.15)
Total Protein, Urine: 300 mg/dL

## 2015-03-21 LAB — VITAMIN B12: Vitamin B-12: 542 pg/mL (ref 180–914)

## 2015-03-21 LAB — FERRITIN: Ferritin: 123 ng/mL (ref 11–307)

## 2015-03-21 LAB — URINE MICROSCOPIC-ADD ON
Bacteria, UA: NONE SEEN
Squamous Epithelial / LPF: NONE SEEN

## 2015-03-21 LAB — LACTIC ACID, PLASMA: Lactic Acid, Venous: 1.2 mmol/L (ref 0.5–2.0)

## 2015-03-21 LAB — IRON AND TIBC
Iron: 58 ug/dL (ref 28–170)
Saturation Ratios: 21 % (ref 10.4–31.8)
TIBC: 276 ug/dL (ref 250–450)
UIBC: 218 ug/dL

## 2015-03-21 LAB — LIPASE, BLOOD: Lipase: 31 U/L (ref 11–51)

## 2015-03-21 LAB — TSH: TSH: 1.232 u[IU]/mL (ref 0.350–4.500)

## 2015-03-21 LAB — FOLATE: Folate: 18.9 ng/mL (ref >5.9)

## 2015-03-21 MED ORDER — IOHEXOL 350 MG/ML SOLN
100.0000 mL | Freq: Once | INTRAVENOUS | Status: AC | PRN
  Administered 2015-03-21: 100 mL via INTRAVENOUS

## 2015-03-21 MED ORDER — ACETAMINOPHEN 325 MG PO TABS: 650.0000 mg | ORAL_TABLET | Freq: Four times a day (QID) | ORAL | Status: DC | PRN

## 2015-03-21 MED ORDER — ONDANSETRON HCL 4 MG/2ML IJ SOLN
4.0000 mg | Freq: Once | INTRAMUSCULAR | Status: AC
  Administered 2015-03-21: 4 mg via INTRAVENOUS
  Filled 2015-03-21: qty 2

## 2015-03-21 MED ORDER — ACETAMINOPHEN 650 MG RE SUPP: 650.0000 mg | Freq: Four times a day (QID) | RECTAL | Status: DC | PRN

## 2015-03-21 MED ORDER — SODIUM CHLORIDE 0.9 % IV SOLN
INTRAVENOUS | Status: DC
  Administered 2015-03-21: 15:00:00 via INTRAVENOUS

## 2015-03-21 MED ORDER — ASPIRIN EC 81 MG PO TBEC
81.0000 mg | DELAYED_RELEASE_TABLET | Freq: Every day | ORAL | Status: DC
  Administered 2015-03-22 – 2015-03-23 (×2): 81 mg via ORAL
  Filled 2015-03-21 (×2): qty 1

## 2015-03-21 MED ORDER — METOPROLOL SUCCINATE ER 25 MG PO TB24
25.0000 mg | ORAL_TABLET | Freq: Every day | ORAL | Status: DC
  Administered 2015-03-21 – 2015-03-23 (×3): 25 mg via ORAL
  Filled 2015-03-21 (×3): qty 1

## 2015-03-21 MED ORDER — LORATADINE 10 MG PO TABS
10.0000 mg | ORAL_TABLET | Freq: Every day | ORAL | Status: DC
  Administered 2015-03-21 – 2015-03-23 (×2): 10 mg via ORAL
  Filled 2015-03-21 (×2): qty 1

## 2015-03-21 MED ORDER — FENTANYL CITRATE (PF) 100 MCG/2ML IJ SOLN
50.0000 ug | Freq: Once | INTRAMUSCULAR | Status: AC
  Administered 2015-03-21: 50 ug via INTRAVENOUS
  Filled 2015-03-21: qty 2

## 2015-03-21 MED ORDER — SODIUM CHLORIDE 0.9 % IV SOLN
INTRAVENOUS | Status: DC
  Administered 2015-03-22: 03:00:00 via INTRAVENOUS

## 2015-03-21 MED ORDER — ROSUVASTATIN CALCIUM 20 MG PO TABS
20.0000 mg | ORAL_TABLET | Freq: Every day | ORAL | Status: DC
  Administered 2015-03-22: 20 mg via ORAL
  Filled 2015-03-21: qty 1

## 2015-03-21 MED ORDER — SODIUM CHLORIDE 0.9 % IJ SOLN
3.0000 mL | Freq: Two times a day (BID) | INTRAMUSCULAR | Status: DC
  Administered 2015-03-21 – 2015-03-23 (×4): 3 mL via INTRAVENOUS

## 2015-03-21 MED ORDER — OXYCODONE HCL 5 MG PO TABS: 5.0000 mg | ORAL_TABLET | ORAL | Status: DC | PRN

## 2015-03-21 MED ORDER — PANTOPRAZOLE SODIUM 40 MG PO TBEC
40.0000 mg | DELAYED_RELEASE_TABLET | Freq: Every day | ORAL | Status: DC
  Administered 2015-03-21 – 2015-03-23 (×3): 40 mg via ORAL
  Filled 2015-03-21 (×3): qty 1

## 2015-03-21 MED ORDER — DEXTROSE 5 % IV SOLN
1.0000 g | INTRAVENOUS | Status: DC
  Administered 2015-03-22: 1 g via INTRAVENOUS
  Filled 2015-03-21 (×2): qty 10

## 2015-03-21 MED ORDER — DEXTROSE 5 % IV SOLN
1.0000 g | Freq: Once | INTRAVENOUS | Status: AC
  Administered 2015-03-21: 1 g via INTRAVENOUS
  Filled 2015-03-21: qty 10

## 2015-03-21 MED ORDER — MORPHINE SULFATE (PF) 2 MG/ML IV SOLN
1.0000 mg | INTRAVENOUS | Status: DC | PRN
  Administered 2015-03-22 (×2): 1 mg via INTRAVENOUS
  Filled 2015-03-21 (×2): qty 1

## 2015-03-21 MED ORDER — ENOXAPARIN SODIUM 40 MG/0.4ML ~~LOC~~ SOLN
40.0000 mg | SUBCUTANEOUS | Status: DC
  Administered 2015-03-21: 40 mg via SUBCUTANEOUS
  Filled 2015-03-21: qty 0.4

## 2015-03-21 NOTE — Consult Note (Signed)
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 She has no prior urologic history. She does report a history of sporadic urinary tract infections. 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 She reports developing some dysuria increased bladder pressure and frequency approximately a week ago. She was treated with a course of nitrofurantoin. Her symptoms did improve. Today she developed sudden onset of severe right-sided upper quadrant and flank discomfort. This was associated with severe nausea. She had great deal of concerns that this could be atypical chest pain given her previous history of coronary artery disease and MI. Brought to the emergency room via EMS. Urinalysis at that time was suspicious for cystitis/pyelonephritis. She remained afebrile and hemodynamically stable. EKG was unremarkable. She did have a slightly elevated white blood cell count of 12.6 thousand. CT scan without IV contrast revealed evidence of significant right-sided hydronephrosis. This apparently had been noted previously but was much milder. 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 The renal parenchyma otherwise appeared well preserved. There was no evidence of an obvious renal or ureteral calculus. No gross evidence of a tumor. She has remained hemodynamically stable and is not shown overt evidence of sepsis. Lactate levels are pending. She is currently reporting only very minimal pain that she scores as a one or 2. She is having no ongoing nausea. She has no other complaints.  Past Medical History  Diagnosis Date  . Coronary artery disease 08-14-09    a. 08/2009: Ant MI c/b v-fib arrest,  Campbell/p PTCA/BMS to LAD.  Marland Kitchen Ventricular fibrillation ([place])     a. 08/2009: due to anterior MI.  Marland Kitchen GERD (gastroesophageal reflux disease)   . Osteoarthritis   . Osteopenia   . Rib fractures     left sided second and sixth rib   . Compression fracture     T9  . PONV (postoperative nausea and vomiting)   . Ischemic cardiomyopathy     a. EF 40-45% in 08/2009 at time of MI. b. Improved to 55-60% by June 2011.  Marland Kitchen Postoperative groin pseudoaneurysm ([place])     a. 04/2010 following diagnostic cardiac cath, Campbell/p compression.  Marland Kitchen COPD (chronic obstructive pulmonary disease) ([place])   . HTN (hypertension)   . Hyperlipidemia    Past Surgical History  Procedure Laterality Date  . Tonsillectomy    . Appendectomy    . Orif hip fracture    . Leg surgery      R low  . Back surgery    . Left heart catheterization with coronary angiogram N/A 11/16/2012    Procedure: LEFT HEART CATHETERIZATION WITH CORONARY ANGIOGRAM;  Surgeon: Burnell Blanks, MD;  Location: [facility] CATH LAB;  Service: Cardiovascular;  Laterality: N/A;    Medications:  Scheduled: . aspirin EC  81 mg Oral Daily  . [START ON 03/22/2015] cefTRIAXone (ROCEPHIN)  IV  1 g Intravenous Q24H  . enoxaparin (LOVENOX) injection  40 mg Subcutaneous Q24H  . loratadine  10 mg Oral Daily  . metoprolol succinate  25 mg Oral Daily  . pantoprazole  40 mg Oral Daily  . [START ON 03/22/2015] rosuvastatin  20 mg Oral q1800  . sodium chloride  3 mL Intravenous Q12H    Allergies:  Allergies  Allergen  Reactions  . Codeine Nausea And Vomiting  . Imdur [Isosorbide Dinitrate] Nausea Only    Headache  . Sulfonamide Derivatives Nausea And Vomiting    No family history on file.  Social History:  reports that she has quit smoking. She has never used smokeless tobacco. She reports that she drinks alcohol. She reports that she does not use illicit drugs.  ROS positive for severe nausea emesis abdominal and flank pain. Currently without gross hematuria or  dysuria. No obvious chills currently. Otherwise negative.  Physical Exam:  Vital signs in last 24 hours: Temp:  [98.2 F (36.8 C)-98.7 F (37.1 C)] 98.7 F (37.1 C) (12/15 1815) Pulse Rate:  [63-77] 73 (12/15 1815) Resp:  [13-25] 16 (12/15 1815) BP: (102-139)/(59-98) 137/76 mmHg (12/15 1815) SpO2:  [91 %-100 %] 96 % (12/15 1815) Weight:  [72.576 kg (160 lb)] 72.576 kg (160 lb) (12/15 0928)  Constitutional: Vital signs reviewed. WD WN in NAD Head: Normocephalic and atraumatic   Eyes: PERRL, No scleral icterus.  Neck: Supple No  Gross JVD, mass, thyromegaly, or carotid bruit present.  Cardiovascular: RRR Pulmonary/Chest: Normal effort Abdominal: Soft. Non-tender, no gross distention. She has no significant upper quadrant or CVA tenderness. Extremities: No cyanosis or edema  Neurological: Grossly non-focal.  Skin: Warm,very dry and intact. No rash, cyanosis   Laboratory Data:  Results for orders placed or performed during the hospital encounter of 03/21/15 (from the past 72 hour(Campbell))  Urinalysis, Routine w reflex microscopic     Status: Abnormal   Collection Time: 03/21/15 10:01 AM  Result Value Ref Range   Color, Urine YELLOW YELLOW   APPearance TURBID (A) CLEAR   Specific Gravity, Urine 1.013 1.005 - 1.030   pH 7.0 5.0 - 8.0   Glucose, UA NEGATIVE NEGATIVE mg/dL   Hgb urine dipstick LARGE (A) NEGATIVE   Bilirubin Urine NEGATIVE NEGATIVE   Ketones, ur NEGATIVE NEGATIVE mg/dL   Protein, ur >300 (A) NEGATIVE mg/dL   Nitrite NEGATIVE NEGATIVE   Leukocytes, UA MODERATE (A) NEGATIVE  Urine microscopic-add on     Status: None   Collection Time: 03/21/15 10:01 AM  Result Value Ref Range   Squamous Epithelial / LPF NONE SEEN NONE SEEN   WBC, UA TOO NUMEROUS TO COUNT 0 - 5 WBC/hpf   RBC / HPF TOO NUMEROUS TO COUNT 0 - 5 RBC/hpf   Bacteria, UA NONE SEEN NONE SEEN  Comprehensive metabolic panel     Status: Abnormal   Collection Time: 03/21/15 10:35 AM  Result Value Ref Range    Sodium 137 135 - 145 mmol/L   Potassium 4.9 3.5 - 5.1 mmol/L   Chloride 102 101 - 111 mmol/L   CO2 25 22 - 32 mmol/L   Glucose, Bld 112 (H) 65 - 99 mg/dL   BUN 16 6 - 20 mg/dL   Creatinine, Ser 0.83 0.44 - 1.00 mg/dL   Calcium 9.0 8.9 - 10.3 mg/dL   Total Protein 6.5 6.5 - 8.1 g/dL   Albumin 3.6 3.5 - 5.0 g/dL   AST 23 15 - 41 U/L   ALT 16 14 - 54 U/L   Alkaline Phosphatase 61 38 - 126 U/L   Total Bilirubin 0.8 0.3 - 1.2 mg/dL   GFR calc non Af Amer >60 >60 mL/min   GFR calc Af Amer >60 >60 mL/min    Comment: (NOTE) The eGFR has been calculated using the CKD EPI equation. This calculation has not been validated in all clinical situations. eGFR'Campbell persistently <60  mL/min signify possible Chronic Kidney Disease.    Anion gap 10 5 - 15  CBC with Differential     Status: Abnormal   Collection Time: 03/21/15 10:35 AM  Result Value Ref Range   WBC 12.6 (H) 4.0 - 10.5 K/uL   RBC 3.96 3.87 - 5.11 MIL/uL   Hemoglobin 13.9 12.0 - 15.0 g/dL   HCT 40.5 36.0 - 46.0 %   MCV 102.3 (H) 78.0 - 100.0 fL   MCH 35.1 (H) 26.0 - 34.0 pg   MCHC 34.3 30.0 - 36.0 g/dL   RDW 12.7 11.5 - 15.5 %   Platelets 232 150 - 400 K/uL   Neutrophils Relative % 87 %   Neutro Abs 11.0 (H) 1.7 - 7.7 K/uL   Lymphocytes Relative 4 %   Lymphs Abs 0.5 (L) 0.7 - 4.0 K/uL   Monocytes Relative 8 %   Monocytes Absolute 1.0 0.1 - 1.0 K/uL   Eosinophils Relative 1 %   Eosinophils Absolute 0.1 0.0 - 0.7 K/uL   Basophils Relative 0 %   Basophils Absolute 0.0 0.0 - 0.1 K/uL  Lipase, blood     Status: None   Collection Time: 03/21/15 10:35 AM  Result Value Ref Range   Lipase 31 11 - 51 U/L  D-dimer, quantitative     Status: Abnormal   Collection Time: 03/21/15 10:35 AM  Result Value Ref Range   D-Dimer, Quant 0.84 (H) 0.00 - 0.50 ug/mL-FEU    Comment: (NOTE) At the manufacturer cut-off of 0.50 ug/mL FEU, this assay has been documented to exclude PE with a sensitivity and negative predictive value of 97 to 99%.  At  this time, this assay has not been approved by the FDA to exclude DVT/VTE. Results should be correlated with clinical presentation.   I-stat troponin, ED (not at [facility], [facility])     Status: None   Collection Time: 03/21/15 10:46 AM  Result Value Ref Range   Troponin i, poc 0.00 0.00 - 0.08 ng/mL   Comment 3            Comment: Due to the release kinetics of cTnI, a negative result within the first hours of the onset of symptoms does not rule out myocardial infarction with certainty. If myocardial infarction is still suspected, repeat the test at appropriate intervals.    No results found for this or any previous visit (from the past 240 hour(Campbell)). 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 She has had some evidence of a dilated renal pelvis historically. For unclear reasons this has significantly worsened acutely and she has acute obstruction with marked dilation of the renal pelvis. She is currently much better and her pain is minimal at this time. She clearly has an abnormal urinalysis and evidence of probable ongoing cystitis. There is no clear evidence of pyelonephritis at this time. While her white blood cell count is slightly elevated she is afebrile and has no obvious CVA tenderness. Given her significant clinical improvement and stability I do not think she urgently is in need of decompression of the hydronephrosis via percutaneous nephrostomy tube or double-J stent. Clearly if she does develop evidence of any urosepsis or clinical pyelonephritis that she should undergo decompression of the collecting system. Her cardiac history and the fact that she is at the [facility] I think that would be safer to  proceed with a percutaneous nephrostomy tube placed by interventional radiology. If she remains afebrile and her pain continues to be  minimal/well-controlled and she hopefully can be discharged quite quickly and can undergo additional assessment as an outpatient. Evaluation would include contrast CT as well as nuclear renal scan to differentiate renal function and determine the degree of obstruction. Retrograde pyelography and double-J stent placement can be done electively as indicated. Again if there is any concern that she does have upper tract infection along with the obstruction then urgent percutaneous nephrostomy tube needs to be performed. Plan:  As above  [name],[name] 03/21/2015, 8:25 PM

## 2015-03-21 NOTE — ED Notes (Signed)
MD at bedside. 

## 2015-03-21 NOTE — ED Notes (Addendum)
Pt presents via [facility] from home c/o chest pain, right side radiating to right neck and shoulder, described as sharp.  Pt was getting up to the restroom this AM and noticed she was having CP, reports SOB, N/V.  Hx: stents, MI, HTN, HLD.  324 ASA given and 1 NTG without relief.  EKG unremarkable.  BP-106/74 P-94 R-24 O2-96% RA.  Pt a x 4, NAD.  Pt also reports recently dx with UTI, finished abx Tuesday. Family at bedside

## 2015-03-21 NOTE — ED Provider Notes (Signed)
CSN: [id number]     Arrival date & time 03/21/15  4098 History   First MD Initiated Contact with Patient 03/21/15 [phone]     Chief Complaint  Patient presents with  . Chest Pain     The history is provided by the patient. No language interpreter was used.   [name] is a 74 y.o. female w/ hx/o CAD, HTN, HPL who presents to the Emergency Department complaining of chest pain.  She recently completed treatment two days ago for UTI.  About 0300 this morning she awoke with severe, sharp right sided flank pain with associated vomiting followed by dry heaving.  Shortly thereafter she developed right sided chest pain radiating to her right neck and arm with associated SOB.  The chest pain feels different than the side pain and is described as discomfort.  She denies any fevers, abdominal pain, lower extremity pain/swelling.  She called EMS and received 324 ASA and nitro prior to ED arrival without significant relief.  She reports ongoing dysuria for the last week.     Past Medical History  Diagnosis Date  . Coronary artery disease 08-14-09    a. 08/2009: Ant MI c/b v-fib arrest, s/p PTCA/BMS to LAD.  Marland Kitchen Ventricular fibrillation ([facility])     a. 08/2009: due to anterior MI.  Marland Kitchen GERD (gastroesophageal reflux disease)   . Osteoarthritis   . Osteopenia   . Rib fractures     left sided second and sixth rib   . Compression fracture     T9  . PONV (postoperative nausea and vomiting)   . Ischemic cardiomyopathy     a. EF 40-45% in 08/2009 at time of MI. b. Improved to 55-60% by June 2011.  Marland Kitchen Postoperative groin pseudoaneurysm ([facility])     a. 04/2010 following diagnostic cardiac cath, s/p compression.  Marland Kitchen COPD (chronic obstructive pulmonary disease) ([facility])   . HTN (hypertension)   . Hyperlipidemia    Past Surgical History  Procedure Laterality Date  . Tonsillectomy    . Appendectomy    . Orif hip fracture    . Leg surgery      R low  . Back surgery    . Left heart catheterization with coronary angiogram  N/A 11/16/2012    Procedure: LEFT HEART CATHETERIZATION WITH CORONARY ANGIOGRAM;  Surgeon: Kathleene Hazel, MD;  Location: [facility] CATH LAB;  Service: Cardiovascular;  Laterality: N/A;   No family history on file. Social History  Substance Use Topics  . Smoking status: Former Smoker -- 20 years  . Smokeless tobacco: Never Used  . Alcohol Use: Yes     Comment: No history of alcohol abuse   OB History    No data available     Review of Systems  All other systems reviewed and are negative.     Allergies  Codeine; Imdur; and Sulfonamide derivatives  Home Medications   Prior to Admission medications   Medication Sig Start Date End Date Taking? Authorizing Provider  aspirin 81 MG EC tablet Take 81 mg by mouth daily.      Historical Provider, MD  calcium carbonate (OS-CAL) 600 MG TABS tablet Take 600 mg by mouth daily with breakfast.    Historical Provider, MD  losartan (COZAAR) 50 MG tablet Take 1 tablet (50 mg total) by mouth daily. 11/17/12   Dayna N Dunn, PA-C  metoprolol succinate (TOPROL-XL) 25 MG 24 hr tablet TAKE 1 TABLET DAILY (CONTACT OFFICE TO SCHEDULE APPOINTMENT FOR FUTURE REFILLS, [phone]) 10/01/14  Peter M [place], MD  mometasone-formoterol [facility]) 100-5 MCG/ACT AERO Inhale 2 puffs into the lungs 2 (two) times daily. Patient taking differently: Inhale 2 puffs into the lungs as needed.  11/17/12   Dayna N Dunn, PA-C  Multiple Vitamins-Minerals (QC WOMENS DAILY MULTIVITAMIN) TABS Take by mouth daily. with iron     Historical Provider, MD  nitroGLYCERIN (NITROSTAT) 0.4 MG SL tablet Place 1 tablet (0.4 mg total) under the tongue every 5 (five) minutes as needed. For chest pain 08/15/14   Brittainy Sherlynn Carbon, PA-C  pantoprazole (PROTONIX) 40 MG tablet Take 1 tablet (40 mg total) by mouth daily. 02/15/15   Peter M [place], MD  rosuvastatin (CRESTOR) 20 MG tablet TAKE 1 TABLET BY MOUTH AT BEDTIME 01/28/15   Brittainy Sherlynn Carbon, PA-C  Zoledronic Acid (RECLAST IV) Inject into  the vein. Once Yearly    Historical Provider, MD   BP 114/70 mmHg  Pulse 66  Temp(Src) 98.2 F (36.8 C) (Oral)  Resp 25  Ht 5\' 5"  (1.651 m)  Wt 160 lb (72.576 kg)  BMI 26.63 kg/m2  SpO2 98% Physical Exam  Constitutional: She is oriented to person, place, and time. She appears well-developed and well-nourished.  HENT:  Head: Normocephalic and atraumatic.  Neck: No JVD present.  Cardiovascular: Normal rate and regular rhythm.   No murmur heard. Pulmonary/Chest: Effort normal. No respiratory distress.  Faint crackles in bilateral bases; kyphosis  Abdominal: Soft. There is no tenderness. There is no rebound and no guarding.  No CVA tenderness  Musculoskeletal: She exhibits no edema or tenderness.  Neurological: She is alert and oriented to person, place, and time.  Skin: Skin is warm and dry.  Psychiatric: She has a normal mood and affect. Her behavior is normal.  Nursing note and vitals reviewed.   ED Course  Procedures (including critical care time) Labs Review Labs Reviewed  COMPREHENSIVE METABOLIC PANEL - Abnormal; Notable for the following:    Glucose, Bld 112 (*)    All other components within normal limits  CBC WITH DIFFERENTIAL/PLATELET - Abnormal; Notable for the following:    WBC 12.6 (*)    MCV 102.3 (*)    MCH 35.1 (*)    Neutro Abs 11.0 (*)    Lymphs Abs 0.5 (*)    All other components within normal limits  URINALYSIS, ROUTINE W REFLEX MICROSCOPIC (NOT AT [facility]) - Abnormal; Notable for the following:    APPearance TURBID (*)    Hgb urine dipstick LARGE (*)    Protein, ur >300 (*)    Leukocytes, UA MODERATE (*)    All other components within normal limits  D-DIMER, QUANTITATIVE (NOT AT [facility]) - Abnormal; Notable for the following:    D-Dimer, Quant 0.84 (*)    All other components within normal limits  URINE CULTURE  CULTURE, BLOOD (ROUTINE X 2)  CULTURE, BLOOD (ROUTINE X 2)  LIPASE, BLOOD  URINE MICROSCOPIC-ADD ON  PROTEIN / CREATININE RATIO, URINE   IMMUNOFIXATION ELECTROPHORESIS, URINE (WITH TOT PROT)  VITAMIN B12  FOLATE  IRON AND TIBC  FERRITIN  RETICULOCYTES  TSH  I-STAT TROPOININ, ED    Imaging Review Dg Chest 2 View  03/21/2015  CLINICAL DATA:  Shortness of breath.  Chest pain.  Kidney problems. EXAM: CHEST  2 VIEW COMPARISON:  11/15/2012 ; 03/04/2012; chest CT- 11/25/2012 FINDINGS: Grossly unchanged cardiac silhouette and mediastinal contours with tortuosity and potential slight ectasia of the thoracic aorta. Post coronary artery calcifications. The lungs remain hyperexpanded. No focal airspace opacities.  No pleural effusion or pneumothorax. No evidence of edema. No acute osseus abnormalities. Post lower thoracic vertebral body augmentation. Unchanged moderate (approximately 40%) compression deformity involving the superior endplate of a mid thoracic vertebral body. IMPRESSION: Mild lung hyperexpansion without acute cardiopulmonary disease. Electronically Signed   By: Simonne Come M.D.   On: 03/21/2015 11:58   Ct Angio Chest Pe W/cm &/or Wo Cm  03/21/2015  CLINICAL DATA:  Right-sided chest pain and shortness of Breath EXAM: CT ANGIOGRAPHY CHEST WITH CONTRAST TECHNIQUE: Multidetector CT imaging of the chest was performed using the standard protocol during bolus administration of intravenous contrast. Multiplanar CT image reconstructions and MIPs were obtained to evaluate the vascular anatomy. CONTRAST:  OMNIPAQUE IOHEXOL 350 MG/ML SOLN COMPARISON:  None. FINDINGS: The lungs are well aerated bilaterally. Mild emphysematous changes are noted bilaterally. No focal infiltrate or sizable effusion is noted. Thoracic aorta is within normal limits. The pulmonary artery demonstrates a normal branching pattern without intraluminal filling defect to suggest pulmonary embolism. No hilar or mediastinal adenopathy is noted. Thoracic inlet is within normal limits. The bony structures show no acute abnormality. A chronic appearing compression  deformity of T9 is noted. Review of the MIP images confirms the above findings. IMPRESSION: No acute abnormality noted. Electronically Signed   By: Alcide Clever M.D.   On: 03/21/2015 13:20   US Renal  03/21/2015  CLINICAL DATA:  Acute right-sided flank pain. Right-sided hydronephrosis seen on preceding abdominal CT. EXAM: RENAL / URINARY TRACT ULTRASOUND COMPLETE COMPARISON:  CT abdomen pelvis - earlier same day; abdominal MRI - 07/13/2008 ; lumbar spine MRI -12/25/2008 FINDINGS: Right Kidney: Normal cortical thickness, echogenicity and size, measuring 11.9 cm in length. No focal renal lesions. No echogenic renal stones. Re- demonstrated moderate severe right-sided pelvicaliectasis, the etiology of which is not demonstrated on the present examination. A minimal amount of echogenic debris is noted within in the right renal pelvis (representative image 9). Note is made of a punctate (approximately 2.6 x 2.7 x 2.0 cm anechoic partially exophytic cyst arising from the superior pole the right kidney. Left Kidney: Normal cortical thickness, echogenicity and size, measuring 10.4 cm in length. No focal renal lesions. No echogenic renal stones. No urinary obstruction. Bladder: Appears normal for degree of bladder distention. Only a left-sided ureteral jet was visualized. IMPRESSION: 1. Moderate to severe right-sided pelvicaliectasis, the etiology of which is not depicted on the present examination. Note, an extrarenal pelvis was demonstrated on remote lumbar spine MRI performed 12/2008 though was without the associated pelvicaliectasis demonstrated on the present examination. 2. Minimal amount of echogenic debris within the dilated right renal pelvis, nonspecific with differential considerations including evolving blood products and/or infection. 3. No evidence of left-sided urinary obstruction. Electronically Signed   By: Simonne Come M.D.   On: 03/21/2015 15:54   Ct Renal Stone Study  03/21/2015  CLINICAL DATA:   Right-sided abdominal pain EXAM: CT ABDOMEN AND PELVIS WITHOUT CONTRAST TECHNIQUE: Multidetector CT imaging of the abdomen and pelvis was performed following the standard protocol without IV contrast. COMPARISON:  05/18/2014 FINDINGS: Lung bases are free of acute infiltrate or sizable effusion. A small sliding-type hiatal hernia is noted. The liver, gallbladder, spleen, adrenal glands and pancreas are within normal limits with the exception of a small hepatic calcification which may represent a granuloma. Left kidney shows no renal calculi or obstructive changes. The left ureter is within normal limits. The right kidney shows fullness of the collecting system which terminates at the you had UPJ  consistent with a UPJ stenosis. No focal stone is identified. A renal cyst is again noted in the upper pole similar to that seen on the prior exam. The distal right ureter is within normal limits. The bladder is decompressed. Diverticular change of the colon is noted. No evidence of diverticulitis is seen. Diffuse aortoiliac calcifications are noted without aneurysmal dilatation. No pelvic mass lesion is seen. The osseous structures show postsurgical change in the left hip and lower lumbar spine. Changes of prior vertebral augmentation are noted. 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 This is worse than that seen on the prior exam. The remainder of the exam is stable from the prior study. Electronically Signed   By: Alcide CleverMark  Lukens M.D.   On: 03/21/2015 13:34   I have personally reviewed and evaluated these images and lab results as part of my medical decision-making.   EKG Interpretation   Date/Time:  Thursday March 21 2015 09:28:18 EST Ventricular Rate:  69 PR Interval:  135 QRS Duration: 89 QT Interval:  390 QTC Calculation: 418 R Axis:   -2 Text Interpretation:  artifacts Sinus rhythm Low voltage, extremity and  precordial leads Confirmed by Lincoln Brighamees, Liz  [phone]([id number]) on 03/21/2015 9:36:50 AM      MDM   Final diagnoses:  Chest pain, unspecified chest pain type  Hydronephrosis, unspecified hydronephrosis type    Pt here for evaluation of chest pain, flank pain, vomiting.  UA equivocal for UTI (no bacteria) - will treat given the patient's symptoms.  In terms of flank pain - CT with worsening hydronephrosis compared to priors, Urology consulted.  In terms of chest pain - pt with significant cardiac hx, EKG without any new ischemic changes - plan to cycle enzymes.  Medicine consulted for admission for observation.      Tilden FossaElizabeth Issam Carlyon, MD 03/21/15 [phone]

## 2015-03-21 NOTE — ED Notes (Signed)
Patient transported to Ultrasound 

## 2015-03-21 NOTE — ED Notes (Signed)
Patient transported to CT 

## 2015-03-21 NOTE — Progress Notes (Signed)
Attempted to call for report X2 no answer

## 2015-03-21 NOTE — ED Notes (Signed)
Admitting at bedside 

## 2015-03-21 NOTE — H&P (Signed)
[facility] History and Physical                                                                                    [name], is a 74 y.o. female  MRN: [id number]   DOB - [birth year]  Admit Date - 03/21/2015  Outpatient Primary MD for the patient is GREEN, [name], MD  Referring MD: Madilyn Hook / ER  Consulting M.D: [facility] with EDP and requested they notify urology of admission and need for consultation  With History of -  Past Medical History  Diagnosis Date  . Coronary artery disease 08-14-09    a. 08/2009: Ant MI c/b v-fib arrest, s/p PTCA/BMS to LAD.  Marland Kitchen Ventricular fibrillation (HCC)     a. 08/2009: due to anterior MI.  Marland Kitchen GERD (gastroesophageal reflux disease)   . Osteoarthritis   . Osteopenia   . Rib fractures     left sided second and sixth rib   . Compression fracture     T9  . PONV (postoperative nausea and vomiting)   . Ischemic cardiomyopathy     a. EF 40-45% in 08/2009 at time of MI. b. Improved to 55-60% by June 2011.  Marland Kitchen Postoperative groin pseudoaneurysm (HCC)     a. 04/2010 following diagnostic cardiac cath, s/p compression.  Marland Kitchen COPD (chronic obstructive pulmonary disease) (HCC)   . HTN (hypertension)   . Hyperlipidemia       Past Surgical History  Procedure Laterality Date  . Tonsillectomy    . Appendectomy    . Orif hip fracture    . Leg surgery      R low  . Back surgery    . Left heart catheterization with coronary angiogram N/A 11/16/2012    Procedure: LEFT HEART CATHETERIZATION WITH CORONARY ANGIOGRAM;  Surgeon: Kathleene Hazel, MD;  Location: [facility] CATH LAB;  Service: Cardiovascular;  Laterality: N/A;    in for   Chief Complaint  Patient presents with  . Chest Pain     HPI This is a 74 year old female patient with a past medical history of CAD/ MI in 2011 with ventricular fibrillation who is status post bare metal stent to the LAD. At that time her EF was 40-45%. She underwent cardiac catheterization 2014 was stable 70. Percent stenosis  in the ramus intermediate branch and nonobstructive disease. The tongue EF was 65%. She also has a history of former tobacco abuse with associated COPD, dyslipidemia, hypertension, GERD, macrocytic anemia as well as recurrent urinary tract infections. She presents to the ER today with complaints of right flank pain associated with nausea and vomiting with subsequent development of right-sided chest pain with shoulder and neck pain after vomiting. Patient recently underwent treatment with Macrobid as an outpatient. She has not had any frank hematuria but reports she went to an urgent care facility last week and was found to have microscopic hematuria. Patient reports that during her initial MI several years ago she manifested as fatigue with dyspnea on exertion and left chest pressure radiating to the arm. She did not have sharp discomfort. Because she developed chest pain after her flank pain and with her cardiac history she presented  to the ER for further evaluation.  In the ER the patient was afebrile and hemodynamically stable, she was not hypoxemic. Chest x-ray unremarkable except for findings consistent with COPD. Laboratory data unremarkable except for mild hyperglycemia glucose 112, troponin normal with normal-appearing EKG for this patient, white count elevated 12,600 with neutrophils 87% and absolute need for 11%, also macrocytosis with an MCV of 102.3. Urinalysis was highly abnormal and suggestive of UTI. She was also found to have significant proteinuria with protein greater than 300 and setting of large amount of hemoglobin and moderate leukocytes and too numerous to count WBCs with minimal squamous epithelials. Urine cultures been obtained by the ER and she was given empiric Rocephin IV. Because of reports of sharp chest pain and a d-dimer was checked and was slightly elevated at 0.84 prompting a CT ANGIO the chest that was unremarkable for PE or other abnormalities. 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 In review of previous urinalysis in May 2015 patient had a urinalysis consistent with UTI and greater than 300 protein, prior to that patient was not having any proteinuria.   Review of Systems   In addition to the HPI above,  No Fever-chills, myalgias or other constitutional symptoms No Headache, changes with Vision or hearing, new weakness, tingling, numbness in any extremity, No problems swallowing food or Liquids, indigestion/reflux No Cough or Shortness of Breath, palpitations, orthopnea or DOE No Abdominal pain, N/V; no melena or hematochezia, no dark tarry stools, Bowel movements are regular, No new skin rashes, lesions, masses or bruises, No new joints pains-aches No recent weight gain or loss No polyuria, polydypsia or polyphagia,  *A full 10 point Review of Systems was done, except as stated above, all other Review of Systems were negative.  Social History Social History  Substance Use Topics  . Smoking status: Former Smoker -- 20 years  . Smokeless tobacco: Never Used  . Alcohol Use: Yes     Comment: No history of alcohol abuse    Resides at: Private residence  Lives with: Spouse  Ambulatory status: Without assistive devices   Family History No family history on file. reviewed with patient and no significant family history related to renal disease, kidney failure, nephropathy or kidney stones   Prior to Admission medications   Medication Sig Start Date End Date Taking? Authorizing Provider  aspirin 81 MG EC tablet Take 81 mg by mouth daily.     Yes Historical Provider, MD  calcium carbonate (OS-CAL) 600 MG TABS tablet Take 600 mg by mouth every evening.    Yes Historical Provider, MD  loratadine (CLARITIN) 10 MG tablet Take 10 mg by mouth daily.   Yes  Historical Provider, MD  losartan (COZAAR) 50 MG tablet Take 1 tablet (50 mg total) by mouth daily. 11/17/12  Yes Dayna N Dunn, PA-C  metoprolol succinate (TOPROL-XL) 25 MG 24 hr tablet TAKE 1 TABLET DAILY (CONTACT OFFICE TO SCHEDULE APPOINTMENT FOR FUTURE REFILLS, [phone]) 10/01/14  Yes Peter M [place], MD  nitroGLYCERIN (NITROSTAT) 0.4 MG SL tablet Place 1 tablet (0.4 mg total) under the tongue every 5 (five) minutes as needed. For chest pain 08/15/14  Yes Brittainy Sherlynn Carbon, PA-C  pantoprazole (PROTONIX) 40 MG tablet Take 1 tablet (40 mg total) by mouth daily. 02/15/15  Yes Demetria Pore  [place], MD  rosuvastatin (CRESTOR) 20 MG tablet TAKE 1 TABLET BY MOUTH AT BEDTIME 01/28/15  Yes Brittainy M Simmons, PA-C  mometasone-formoterol (DULERA) 100-5 MCG/ACT AERO Inhale 2 puffs into the lungs 2 (two) times daily. Patient not taking: Reported on 03/21/2015 11/17/12   Dayna N Dunn, PA-C  Zoledronic Acid (RECLAST IV) Inject into the vein. Once Yearly    Historical Provider, MD    Allergies  Allergen Reactions  . Codeine Nausea And Vomiting  . Imdur [Isosorbide Dinitrate] Nausea Only    Headache  . Sulfonamide Derivatives Nausea And Vomiting    Physical Exam  Vitals  Blood pressure 109/67, pulse 64, temperature 98.2 F (36.8 C), temperature source Oral, resp. rate 18, height  (1.651 m), weight 160 lb (72.576 kg), SpO2 100 %.   General:  In no acute distress, appears healthy and well nourished and younger than stated age  Psych:  Normal affect, Denies Suicidal or Homicidal ideations, Awake Alert, Oriented X 3. Speech and thought patterns are clear and appropriate, no apparent short term memory deficits  Neuro:   No focal neurological deficits, CN II through XII intact, Strength 5/5 all 4 extremities, Sensation intact all 4 extremities.  ENT:  Ears and Eyes appear Normal, Conjunctivae clear, PER. Moist oral mucosa without erythema or exudates.  Neck:  Supple, No lymphadenopathy  appreciated  Respiratory:  Symmetrical chest wall movement, Good air movement bilaterally, CTAB. Room Air  Cardiac:  RRR, No Murmurs, no LE edema noted, no JVD, No carotid bruits, peripheral pulses palpable at 2+  Abdomen:  Positive bowel sounds, Soft, Non tender, Non distended,  No masses appreciated, no obvious hepatosplenomegaly  Genitourinary: Positive CVAT on the right with direct palpation over flank  Skin:  No Cyanosis, Normal Skin Turgor, No Skin Rash or Bruise.  Extremities: Symmetrical without obvious trauma or injury,  no effusions.  Data Review  CBC  Recent Labs Lab 03/21/15 1035  WBC 12.6*  HGB 13.9  HCT 40.5  PLT 232  MCV 102.3*  MCH 35.1*  MCHC 34.3  RDW 12.7  LYMPHSABS 0.5*  MONOABS 1.0  EOSABS 0.1  BASOSABS 0.0    Chemistries   Recent Labs Lab 03/21/15 1035  NA 137  K 4.9  CL 102  CO2 25  GLUCOSE 112*  BUN 16  CREATININE 0.83  CALCIUM 9.0  AST 23  ALT 16  ALKPHOS 61  BILITOT 0.8    estimated creatinine clearance is 58.4 mL/min (by C-G formula based on Cr of 0.83).  No results for input(s): TSH, T4TOTAL, T3FREE, THYROIDAB in the last 72 hours.  Invalid input(s): FREET3  Coagulation profile No results for input(s): INR, PROTIME in the last 168 hours.   Recent Labs  03/21/15 1035  DDIMER 0.84*    Cardiac Enzymes No results for input(s): CKMB, TROPONINI, MYOGLOBIN in the last 168 hours.  Invalid input(s): CK  Invalid input(s): POCBNP  Urinalysis    Component Value Date/Time   COLORURINE YELLOW 03/21/2015 1001   APPEARANCEUR TURBID* 03/21/2015 1001   LABSPEC 1.013 03/21/2015 1001   PHURINE 7.0 03/21/2015 1001   GLUCOSEU NEGATIVE 03/21/2015 1001   HGBUR LARGE* 03/21/2015 1001   HGBUR negative 05/03/2008 1357   BILIRUBINUR NEGATIVE 03/21/2015 1001   KETONESUR NEGATIVE 03/21/2015 1001   PROTEINUR >300* 03/21/2015 1001   UROBILINOGEN 0.2 08/08/2013 0721   NITRITE NEGATIVE 03/21/2015 1001   LEUKOCYTESUR MODERATE*  03/21/2015 1001    Imaging results:   Dg Chest 2 View  03/21/2015  CLINICAL  DATA:  Shortness of breath.  Chest pain.  Kidney problems. EXAM: CHEST  2 VIEW COMPARISON:  11/15/2012 ; 03/04/2012; chest CT- 11/25/2012 FINDINGS: Grossly unchanged cardiac silhouette and mediastinal contours with tortuosity and potential slight ectasia of the thoracic aorta. Post coronary artery calcifications. The lungs remain hyperexpanded. No focal airspace opacities. No pleural effusion or pneumothorax. No evidence of edema. No acute osseus abnormalities. Post lower thoracic vertebral body augmentation. Unchanged moderate (approximately 40%) compression deformity involving the superior endplate of a mid thoracic vertebral body. IMPRESSION: Mild lung hyperexpansion without acute cardiopulmonary disease. Electronically Signed   By: Simonne Come M.D.   On: 03/21/2015 11:58   Ct Angio Chest Pe W/cm &/or Wo Cm  03/21/2015  CLINICAL DATA:  Right-sided chest pain and shortness of Breath EXAM: CT ANGIOGRAPHY CHEST WITH CONTRAST TECHNIQUE: Multidetector CT imaging of the chest was performed using the standard protocol during bolus administration of intravenous contrast. Multiplanar CT image reconstructions and MIPs were obtained to evaluate the vascular anatomy. CONTRAST:  OMNIPAQUE IOHEXOL 350 MG/ML SOLN COMPARISON:  None. FINDINGS: The lungs are well aerated bilaterally. Mild emphysematous changes are noted bilaterally. No focal infiltrate or sizable effusion is noted. Thoracic aorta is within normal limits. The pulmonary artery demonstrates a normal branching pattern without intraluminal filling defect to suggest pulmonary embolism. No hilar or mediastinal adenopathy is noted. Thoracic inlet is within normal limits. The bony structures show no acute abnormality. A chronic appearing compression deformity of T9 is noted. Review of the MIP images confirms the above findings. IMPRESSION: No acute abnormality noted. Electronically  Signed   By: Alcide Clever M.D.   On: 03/21/2015 13:20   Ct Renal Stone Study  03/21/2015  CLINICAL DATA:  Right-sided abdominal pain EXAM: CT ABDOMEN AND PELVIS WITHOUT CONTRAST TECHNIQUE: Multidetector CT imaging of the abdomen and pelvis was performed following the standard protocol without IV contrast. COMPARISON:  05/18/2014 FINDINGS: Lung bases are free of acute infiltrate or sizable effusion. A small sliding-type hiatal hernia is noted. The liver, gallbladder, spleen, adrenal glands and pancreas are within normal limits with the exception of a small hepatic calcification which may represent a granuloma. Left kidney shows no renal calculi or obstructive changes. The left ureter is within normal limits. The right kidney shows fullness of the collecting system which terminates at the you had UPJ consistent with a UPJ stenosis. No focal stone is identified. A renal cyst is again noted in the upper pole similar to that seen on the prior exam. The distal right ureter is within normal limits. The bladder is decompressed. Diverticular change of the colon is noted. No evidence of diverticulitis is seen. Diffuse aortoiliac calcifications are noted without aneurysmal dilatation. No pelvic mass lesion is seen. The osseous structures show postsurgical change in the left hip and lower lumbar spine. Changes of prior vertebral augmentation are noted. 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 This is worse than that seen on the prior exam. The remainder of the exam is stable from the prior study. Electronically Signed   By: Alcide Clever M.D.   On: 03/21/2015 13:34     EKG: (Independently reviewed)  sinus rhythm with ventricular rate 69 bpm, QTC 418 ms, low-voltage R waves and extremity and lateral leads, no ischemic changes.   Assessment & Plan  Principal Problem:   Hydronephrosis, right with associated Acute UTI/ Stenosis of ureteropelvic junction -Telemetry (see  below regarding atypical chest pain) -Urology consultation pending -Rocephin for UTI -  Tylenol, oxycodone and IV morphine for pain -Renal ultrasound -Blood cultures; follow up on urine culture  Active Problems:   CAD/Atypical chest pain -Chest pain has resolved and was not typical of MI pain several years ago -EKG and troponin have been normal -As precaution we'll cycle troponin -Continue preadmission beta blocker and aspirin -Previous stent was bare metal     Proteinuria -Likely related to acute UTI with associated hydronephrosis -As precaution we'll check UPEP and spot urine/ creatinine ratio -Renal function is normal and no indication at this juncture to consult nephrology -If above diagnostic workup within normal limits recommend repeating urinalysis at a later date after UTI symptoms have resolved and hydronephrosis has resolved -Patient did receive IV contrast for CT angiography so we'll provide gentle IV fluid hydration     COPD  -Currently asymptomatic    HTN  -Currently controlled with systolic blood pressures in the low 100s -In setting of acute UTI with hydronephrosis and anticipation of potential contrast related urological procedure will hold ARB for now    Chronic diastolic heart failure  -Appears compensated -Monitor with volume infusion    Macrocytic anemia -Check anemia panel -No reports of GI losses by pT -Check TSH    GERD -Continue PPI    DVT Prophylaxis: Lovenox  Family Communication:  Husband at bedside   Code Status:  Full code  Condition:  Stable  Discharge disposition: Anticipate discharge back to home environment once medically stable  Time spent in minutes : 60      ELLIS,ALLISON L. ANP on 03/21/2015 at 2:50 PM  You may contact me by going to [email or web address] - password [email or web address]  I am available from 7a-7p but please confirm I am on the schedule by going to Amion as above.   After 7p please contact night coverage person covering me  after hours  [facility]

## 2015-03-21 NOTE — ED Notes (Signed)
Pt sleeping after pain medication, O2-85-90% RA, 2L O2 applied.

## 2015-03-22 DIAGNOSIS — N1330 Unspecified hydronephrosis: Secondary | ICD-10-CM

## 2015-03-22 LAB — COMPREHENSIVE METABOLIC PANEL
ALT: 14 U/L (ref 14–54)
AST: 20 U/L (ref 15–41)
Albumin: 2.9 g/dL — ABNORMAL LOW (ref 3.5–5.0)
Alkaline Phosphatase: 57 U/L (ref 38–126)
Anion gap: 5 (ref 5–15)
BUN: 14 mg/dL (ref 6–20)
CO2: 26 mmol/L (ref 22–32)
Calcium: 8.1 mg/dL — ABNORMAL LOW (ref 8.9–10.3)
Chloride: 101 mmol/L (ref 101–111)
Creatinine, Ser: 0.8 mg/dL (ref 0.44–1.00)
GFR calc Af Amer: >60 mL/min (ref >60)
GFR calc non Af Amer: >60 mL/min (ref >60)
Glucose, Bld: 128 mg/dL — ABNORMAL HIGH (ref 65–99)
Potassium: 3.9 mmol/L (ref 3.5–5.1)
Sodium: 132 mmol/L — ABNORMAL LOW (ref 135–145)
Total Bilirubin: 0.4 mg/dL (ref 0.3–1.2)
Total Protein: 5.9 g/dL — ABNORMAL LOW (ref 6.5–8.1)

## 2015-03-22 LAB — CBC
HCT: 37.1 % (ref 36.0–46.0)
Hemoglobin: 12.2 g/dL (ref 12.0–15.0)
MCH: 34.1 pg — ABNORMAL HIGH (ref 26.0–34.0)
MCHC: 32.9 g/dL (ref 30.0–36.0)
MCV: 103.6 fL — ABNORMAL HIGH (ref 78.0–100.0)
Platelets: 231 10*3/uL (ref 150–400)
RBC: 3.58 MIL/uL — ABNORMAL LOW (ref 3.87–5.11)
RDW: 12.8 % (ref 11.5–15.5)
WBC: 6.7 10*3/uL (ref 4.0–10.5)

## 2015-03-22 LAB — LACTIC ACID, PLASMA: Lactic Acid, Venous: 1.5 mmol/L (ref 0.5–2.0)

## 2015-03-22 NOTE — Progress Notes (Signed)
Utilization review completed. Oscar Hank, RN, BSN. 

## 2015-03-22 NOTE — Progress Notes (Signed)
PROGRESS NOTE  [name] JXB:[id number] DOB: 01-13-1941 DOA: 03/21/2015 PCP: Enrique Sack, MD   HPI: 74 yo F admitted with flank pain found to have right sided hydronephrosis. Urology consulted.    Subjective / 24 H Interval events - little to no back pain - denies abdominal discomfort, no nausea/vomiting  Assessment/Plan: Principal Problem:   Hydronephrosis, right Active Problems:   Macrocytic anemia   CAD (coronary artery disease)   GERD   COPD (chronic obstructive pulmonary disease) (HCC)   Atypical chest pain   Acute UTI   Proteinuria   HTN (hypertension)   Chronic diastolic heart failure (HCC)   Stenosis of ureteropelvic junction (UPJ)   Hydronephrosis, right with associated Acute UTI/ Stenosis of ureteropelvic junction - Urology consulted, discussed with Dr. Isabel Caprice this morning, he is to re-evaluate and decide perc tube vs other - Rocephin for UTI - Tylenol, oxycodone and IV morphine for pain - cultures pending  CAD/Atypical chest pain - Chest pain has resolved and was not typical of MI pain several years ago - EKG and troponin have been normal - Continue home medications - Previous stent was bare metal   Proteinuria - Likely related to acute UTI with associated hydronephrosis - Renal function is normal and no indication at this juncture to consult nephrology - If above diagnostic workup within normal limits recommend repeating urinalysis at a later date after UTI symptoms have resolved and hydronephrosis has resolved - Patient did receive IV contrast for CT angiography so we'll provide gentle IV fluid hydration  COPD  - Currently asymptomatic  HTN  - Currently controlle - In setting of acute UTI with hydronephrosis and anticipation of potential contrast related urological procedure will hold ARB for now  Chronic diastolic heart failure  - Appears compensated - Monitor with volume infusion  Macrocytic anemia - Check anemia panel - No reports  of GI losses by pT - Check TSH  GERD - Continue PPI    Diet: Diet regular Room service appropriate?: Yes; Fluid consistency:: Thin Fluids: NS DVT Prophylaxis: SCD  Code Status: Full Code Family Communication: no family bedside  Disposition Plan: pending urology evaluation  Barriers to discharge: IV antibiotics  Consultants:  Urology   Procedures:  None    Antibiotics  Anti-infectives    Start     Dose/Rate Route Frequency Ordered Stop   03/22/15 1400  cefTRIAXone (ROCEPHIN) 1 g in dextrose 5 % 50 mL IVPB     1 g 100 mL/hr over 30 Minutes Intravenous Every 24 hours 03/21/15 1828     03/21/15 1345  cefTRIAXone (ROCEPHIN) 1 g in dextrose 5 % 50 mL IVPB     1 g 100 mL/hr over 30 Minutes Intravenous  Once 03/21/15 1343 03/21/15 1513       Studies  Dg Chest 2 View  03/21/2015  CLINICAL DATA:  Shortness of breath.  Chest pain.  Kidney problems. EXAM: CHEST  2 VIEW COMPARISON:  11/15/2012 ; 03/04/2012; chest CT- 11/25/2012 FINDINGS: Grossly unchanged cardiac silhouette and mediastinal contours with tortuosity and potential slight ectasia of the thoracic aorta. Post coronary artery calcifications. The lungs remain hyperexpanded. No focal airspace opacities. No pleural effusion or pneumothorax. No evidence of edema. No acute osseus abnormalities. Post lower thoracic vertebral body augmentation. Unchanged moderate (approximately 40%) compression deformity involving the superior endplate of a mid thoracic vertebral body. IMPRESSION: Mild lung hyperexpansion without acute cardiopulmonary disease. Electronically Signed   By: Simonne Come M.D.   On: 03/21/2015 11:58  Ct Angio Chest Pe W/cm &/or Wo Cm  03/21/2015  CLINICAL DATA:  Right-sided chest pain and shortness of Breath EXAM: CT ANGIOGRAPHY CHEST WITH CONTRAST TECHNIQUE: Multidetector CT imaging of the chest was performed using the standard protocol during bolus administration of intravenous contrast. Multiplanar CT image  reconstructions and MIPs were obtained to evaluate the vascular anatomy. CONTRAST:  100mL OMNIPAQUE IOHEXOL 350 MG/ML SOLN COMPARISON:  None. FINDINGS: The lungs are well aerated bilaterally. Mild emphysematous changes are noted bilaterally. No focal infiltrate or sizable effusion is noted. Thoracic aorta is within normal limits. The pulmonary artery demonstrates a normal branching pattern without intraluminal filling defect to suggest pulmonary embolism. No hilar or mediastinal adenopathy is noted. Thoracic inlet is within normal limits. The bony structures show no acute abnormality. A chronic appearing compression deformity of T9 is noted. Review of the MIP images confirms the above findings. IMPRESSION: No acute abnormality noted. Electronically Signed   By: Alcide CleverMark  Lukens M.D.   On: 03/21/2015 13:20   [place] Renal  03/21/2015  CLINICAL DATA:  Acute right-sided flank pain. Right-sided hydronephrosis seen on preceding abdominal CT. EXAM: RENAL / URINARY TRACT ULTRASOUND COMPLETE COMPARISON:  CT abdomen pelvis - earlier same day; abdominal MRI - 07/13/2008 ; lumbar spine MRI -12/25/2008 FINDINGS: Right Kidney: Normal cortical thickness, echogenicity and size, measuring 11.9 cm in length. No focal renal lesions. No echogenic renal stones. Re- demonstrated moderate severe right-sided pelvicaliectasis, the etiology of which is not demonstrated on the present examination. A minimal amount of echogenic debris is noted within in the right renal pelvis (representative image 9). Note is made of a punctate (approximately 2.6 x 2.7 x 2.0 cm anechoic partially exophytic cyst arising from the superior pole the right kidney. Left Kidney: Normal cortical thickness, echogenicity and size, measuring 10.4 cm in length. No focal renal lesions. No echogenic renal stones. No urinary obstruction. Bladder: Appears normal for degree of bladder distention. Only a left-sided ureteral jet was visualized. IMPRESSION: 1. Moderate to severe  right-sided pelvicaliectasis, the etiology of which is not depicted on the present examination. Note, an extrarenal pelvis was demonstrated on remote lumbar spine MRI performed 12/2008 though was without the associated pelvicaliectasis demonstrated on the present examination. 2. Minimal amount of echogenic debris within the dilated right renal pelvis, nonspecific with differential considerations including evolving blood products and/or infection. 3. No evidence of left-sided urinary obstruction. Electronically Signed   By: Simonne ComeJohn  Watts M.D.   On: 03/21/2015 15:54   Ct Renal Stone Study  03/21/2015  CLINICAL DATA:  Right-sided abdominal pain EXAM: CT ABDOMEN AND PELVIS WITHOUT CONTRAST TECHNIQUE: Multidetector CT imaging of the abdomen and pelvis was performed following the standard protocol without IV contrast. COMPARISON:  05/18/2014 FINDINGS: Lung bases are free of acute infiltrate or sizable effusion. A small sliding-type hiatal hernia is noted. The liver, gallbladder, spleen, adrenal glands and pancreas are within normal limits with the exception of a small hepatic calcification which may represent a granuloma. Left kidney shows no renal calculi or obstructive changes. The left ureter is within normal limits. The right kidney shows fullness of the collecting system which terminates at the you had UPJ consistent with a UPJ stenosis. No focal stone is identified. A renal cyst is again noted in the upper pole similar to that seen on the prior exam. The distal right ureter is within normal limits. The bladder is decompressed. Diverticular change of the colon is noted. No evidence of diverticulitis is seen. Diffuse aortoiliac calcifications are noted without  aneurysmal dilatation. No pelvic mass lesion is seen. The osseous structures show postsurgical change in the left hip and lower lumbar spine. Changes of prior vertebral augmentation are noted. IMPRESSION: Significant hydronephrosis in the right kidney likely  related to UPJ stenosis. This is worse than that seen on the prior exam. The remainder of the exam is stable from the prior study. Electronically Signed   By: Alcide Clever M.D.   On: 03/21/2015 13:34    Objective  Filed Vitals:   03/21/15 1815 03/21/15 2057 03/21/15 2146 03/22/15 0537  BP: 137/76 123/68 123/74 115/71  Pulse: 73 88 75 69  Temp: 98.7 F (37.1 C) 98.3 F (36.8 C) 98.9 F (37.2 C) 98.2 F (36.8 C)  TempSrc: Oral Oral Oral Oral  Resp: 16 18 18    Height:      Weight:    75.4 kg (166 lb 3.6 oz)  SpO2: 96% 93% 93% 91%    Intake/Output Summary (Last 24 hours) at 03/22/15 1230 Last data filed at 03/22/15 4315  Gross per 24 hour  Intake   1320 ml  Output    200 ml  Net   1120 ml   Filed Weights   03/21/15 0928 03/22/15 0537  Weight: 72.576 kg (160 lb) 75.4 kg (166 lb 3.6 oz)    Exam:  GENERAL: NAD  HEENT: no scleral icterus, PERRL  NECK: supple, no LAD  LUNGS: CTA biL, no wheezing  HEART: RRR without MRG  ABDOMEN: soft, non tender; no CVA tenderness  EXTREMITIES: no clubbing / cyanosis  NEUROLOGIC: non focal  Data Reviewed: Basic Metabolic Panel:  Recent Labs Lab 03/21/15 1035 03/22/15 0117  NA 137 132*  K 4.9 3.9  CL 102 101  CO2 25 26  GLUCOSE 112* 128*  BUN 16 14  CREATININE 0.83 0.80  CALCIUM 9.0 8.1*   Liver Function Tests:  Recent Labs Lab 03/21/15 1035 03/22/15 0117  AST 23 20  ALT 16 14  ALKPHOS 61 57  BILITOT 0.8 0.4  PROT 6.5 5.9*  ALBUMIN 3.6 2.9*    Recent Labs Lab 03/21/15 1035  LIPASE 31   CBC:  Recent Labs Lab 03/21/15 1035 03/22/15 0117  WBC 12.6* 6.7  NEUTROABS 11.0*  --   HGB 13.9 12.2  HCT 40.5 37.1  MCV 102.3* 103.6*  PLT 232 231   Recent Results (from the past 240 hour(s))  Urine culture     Status: None (Preliminary result)   Collection Time: 03/21/15 10:01 AM  Result Value Ref Range Status   Specimen Description URINE, RANDOM  Final   Special Requests NONE  Final   Culture CULTURE  REINCUBATED FOR BETTER GROWTH  Final   Report Status PENDING  Incomplete  Culture, blood (Routine X 2) w Reflex to ID Panel     Status: None (Preliminary result)   Collection Time: 03/21/15  7:45 PM  Result Value Ref Range Status   Specimen Description BLOOD RIGHT ANTECUBITAL  Final   Special Requests BOTTLES DRAWN AEROBIC AND ANAEROBIC 3CC  Final   Culture NO GROWTH < 24 HOURS  Final   Report Status PENDING  Incomplete  Culture, blood (Routine X 2) w Reflex to ID Panel     Status: None (Preliminary result)   Collection Time: 03/21/15  8:00 PM  Result Value Ref Range Status   Specimen Description BLOOD RIGHT HAND  Final   Special Requests BOTTLES DRAWN AEROBIC ONLY 3CC  Final   Culture NO GROWTH < 24 HOURS  Final  Report Status PENDING  Incomplete     Scheduled Meds: . aspirin EC  81 mg Oral Daily  . cefTRIAXone (ROCEPHIN)  IV  1 g Intravenous Q24H  . loratadine  10 mg Oral Daily  . metoprolol succinate  25 mg Oral Daily  . pantoprazole  40 mg Oral Daily  . rosuvastatin  20 mg Oral q1800  . sodium chloride  3 mL Intravenous Q12H   Continuous Infusions: . sodium chloride 100 mL/hr at 03/22/15 0317   Pamella Pert, MD [facility] Pager [phone]. If 7 PM - 7 AM, please contact night-coverage at [email or web address], password [facility] 03/22/2015, 12:30 PM  LOS: 1 day

## 2015-03-22 NOTE — Care Management Note (Signed)
Case Management Note  Patient Details  Name: [name] MRN: [id number] Date of Birth: 01/14/1941  Subjective/Objective:  Patient lives at home with spouse, pta indep. PCP is Nila NephewEdwin Green. Urology consulted for hydronephrosis.                     Action/Plan:   Expected Discharge Date:                  Expected Discharge Plan:  Home w Home Health Services  In-House Referral:     Discharge planning Services  CM Consult  Post Acute Care Choice:    Choice offered to:     DME Arranged:    DME Agency:     HH Arranged:    HH Agency:     Status of Service:  In process, will continue to follow  Medicare Important Message Given:    Date Medicare IM Given:    Medicare IM give by:    Date Additional Medicare IM Given:    Additional Medicare Important Message give by:     If discussed at Long Length of Stay Meetings, dates discussed:    Additional Comments:  Leone Havenaylor, Chastin Riesgo [place], RN 03/22/2015, 6:17 PM

## 2015-03-22 NOTE — Progress Notes (Signed)
I spoke with Ms. Lacosse's managing physician this morning and with the patient herself this afternoon.  Her flank discomfort is essentially resolved.  She has remained afebrile and is nontoxic in appearance.  Her white blood cell count is normalized.  She does have the appearance of a UPJ obstruction.  She has had some radiographic evidence of a partial obstruction the past.  I do not see an acute indication for a percutaneous nephrostomy tube or double-J stent at this time given her clinical improvement.  It is possible on follow-up that this will resolve spontaneously and no further intervention will be necessary.  She will require additional imaging with contrast study as well as an assessment with a nuclear renal scan.  This can be done as an outpatient assuming she continues to improve.  Once urine culture and sensitivity results are available she should be able to be managed as an outpatient.  If her clinical situation deteriorates she should undergo more urgent decompression of that kidney.  Urology will round on her over the weekend.

## 2015-03-23 DIAGNOSIS — N390 Urinary tract infection, site not specified: Secondary | ICD-10-CM

## 2015-03-23 DIAGNOSIS — I5032 Chronic diastolic (congestive) heart failure: Secondary | ICD-10-CM

## 2015-03-23 DIAGNOSIS — R0789 Other chest pain: Secondary | ICD-10-CM

## 2015-03-23 DIAGNOSIS — I10 Essential (primary) hypertension: Secondary | ICD-10-CM

## 2015-03-23 LAB — BASIC METABOLIC PANEL
Anion gap: 6 (ref 5–15)
BUN: 9 mg/dL (ref 6–20)
CO2: 27 mmol/L (ref 22–32)
Calcium: 8.3 mg/dL — ABNORMAL LOW (ref 8.9–10.3)
Chloride: 104 mmol/L (ref 101–111)
Creatinine, Ser: 0.64 mg/dL (ref 0.44–1.00)
GFR calc Af Amer: >60 mL/min (ref >60)
GFR calc non Af Amer: >60 mL/min (ref >60)
Glucose, Bld: 113 mg/dL — ABNORMAL HIGH (ref 65–99)
Potassium: 3.9 mmol/L (ref 3.5–5.1)
Sodium: 137 mmol/L (ref 135–145)

## 2015-03-23 LAB — URINE CULTURE

## 2015-03-23 MED ORDER — CEFPODOXIME PROXETIL 100 MG PO TABS: 100.0000 mg | ORAL_TABLET | Freq: Two times a day (BID) | ORAL | Status: DC

## 2015-03-23 NOTE — Discharge Instructions (Signed)
Follow with [name], [name] [name], MD in 5-7 days  Please get a complete blood count and chemistry panel checked by your Primary MD at your next visit, and again as instructed by your Primary MD. Please get your medications reviewed and adjusted by your Primary MD.  Please request your Primary MD to go over all Hospital Tests and Procedure/Radiological results at the follow up, please get all Hospital records sent to your Prim MD by signing hospital release before you go home.  If you had Pneumonia of Lung problems at the Hospital: Please get a 2 view Chest X ray done in 6-8 weeks after hospital discharge or sooner if instructed by your Primary MD.  If you have Congestive Heart Failure: Please call your Cardiologist or Primary MD anytime you have any of the following symptoms:  1) 3 pound weight gain in 24 hours or 5 pounds in 1 week  2) shortness of breath, with or without a dry hacking cough  3) swelling in the hands, feet or stomach  4) if you have to sleep on extra pillows at night in order to breathe  Follow cardiac low salt diet and 1.5 lit/day fluid restriction.  If you have diabetes Accuchecks 4 times/day, Once in AM empty stomach and then before each meal. Log in all results and show them to your primary doctor at your next visit. If any glucose reading is under 80 or above 300 call your primary MD immediately.  If you have Seizure/Convulsions/Epilepsy: Please do not drive, operate heavy machinery, participate in activities at heights or participate in high speed sports until you have seen by Primary MD or a Neurologist and advised to do so again.  If you had Gastrointestinal Bleeding: Please ask your Primary MD to check a complete blood count within one week of discharge or at your next visit. Your endoscopic/colonoscopic biopsies that are pending at the time of discharge, will also need to followed by your Primary MD.  Get Medicines reviewed and adjusted. Please take all your  medications with you for your next visit with your Primary MD  Please request your Primary MD to go over all hospital tests and procedure/radiological results at the follow up, please ask your Primary MD to get all Hospital records sent to his/her office.  If you experience worsening of your admission symptoms, develop shortness of breath, life threatening emergency, suicidal or homicidal thoughts you must seek medical attention immediately by calling 911 or calling your MD immediately  if symptoms less severe.  You must read complete instructions/literature along with all the possible adverse reactions/side effects for all the Medicines you take and that have been prescribed to you. Take any new Medicines after you have completely understood and accpet all the possible adverse reactions/side effects.   Do not drive or operate heavy machinery when taking Pain medications.   Do not take more than prescribed Pain, Sleep and Anxiety Medications  Special Instructions: If you have smoked or chewed Tobacco  in the last 2 yrs please stop smoking, stop any regular Alcohol  and or any Recreational drug use.  Wear Seat belts while driving.  Please note You were cared for by a hospitalist during your hospital stay. If you have any questions about your discharge medications or the care you received while you were in the hospital after you are discharged, you can call the unit and asked to speak with the hospitalist on call if the hospitalist that took care of you is not available.  Once you are discharged, your primary care physician will handle any further medical issues. Please note that NO REFILLS for any discharge medications will be authorized once you are discharged, as it is imperative that you return to your primary care physician (or establish a relationship with a primary care physician if you do not have one) for your aftercare needs so that they can reassess your need for medications and monitor your  lab values.  You can reach the hospitalist office at phone [phone] or fax [phone]   If you do not have a primary care physician, you can call [phone] for a physician referral.  Activity: As tolerated with Full fall precautions use walker/cane & assistance as needed  Diet: regular  Disposition Home

## 2015-03-23 NOTE — Discharge Summary (Signed)
Physician Discharge Summary  [name] WUJ:[id number] DOB: January 21, 1941 DOA: 03/21/2015  PCP: Enrique Sack, MD  Admit date: 03/21/2015 Discharge date: 03/23/2015  Time spent: > 30 minutes  Recommendations for Outpatient Follow-up:  1. Follow up with PCP in 1-2 weeks 2. Follow up with Dr. Isabel Caprice in 1 week   Discharge Diagnoses:  Principal Problem:   Hydronephrosis, right Active Problems:   Macrocytic anemia   CAD (coronary artery disease)   GERD   COPD (chronic obstructive pulmonary disease) (HCC)   Atypical chest pain   Acute UTI   Proteinuria   HTN (hypertension)   Chronic diastolic heart failure (HCC)   Stenosis of ureteropelvic junction (UPJ)  Discharge Condition: stable  Diet recommendation: heart healthy  Filed Weights   03/21/15 0928 03/22/15 0537 03/23/15 0649  Weight: 72.576 kg (160 lb) 75.4 kg (166 lb 3.6 oz) 73.437 kg (161 lb 14.4 oz)    History of present illness:  See H&P, Labs, Consult and Test reports for all details in brief, patient is a 74 yo F admitted with flank pain found to have right sided hydronephrosis. Urology consulted.   Hospital Course:  Hydronephrosis, right with associated Acute UTI/ Stenosis of ureteropelvic junction - Urology consulted, discussed with Dr. Isabel Caprice, while it appears that she has the appearance of a UPJ obstruction and also with prior radiographic evidence of a partial obstruction in the past.There was no immediate indication for a percutaneous nephrostomy tube or double-J stent at this time given her clinical improvement with antibiotics and fluids alone. She responded to IV Ceftriaxone, she was afebrile, her WBC normalized and her CVA tenderness resolved. Urine cultures have remained negative without identifying a pathogen. I discussed with weekend on call urologist, will transition patient to Cefpodoxime and will treat as pyelonephritis and she is to complete 10 additional days.  CAD/Atypical chest pain - Chest pain has  resolved and was not typical of MI pain several years ago, EKG and troponin have been normal Proteinuria - Likely related to acute UTI with associated hydronephrosis, renal function has remained normal  COPD - Currently asymptomatic HTN - Currently controlled Chronic diastolic heart failure - Appears compensated Macrocytic anemia - stable GERD - Continue PPI   Procedures:  None    Consultations:  Urology   Discharge Exam: Filed Vitals:   03/22/15 2137 03/23/15 0528 03/23/15 0649 03/23/15 1345  BP: 143/69 146/72  135/85  Pulse: 66 64  69  Temp: 98.2 F (36.8 C) 98.5 F (36.9 C)  98.2 F (36.8 C)  TempSrc: Oral Oral  Oral  Resp: Height:      Weight:   73.437 kg (161 lb 14.4 oz)   SpO2: 95% 95%  93%    General: NAD Cardiovascular: RRR Respiratory: CTA biL MSK: no CVA tenderness  Discharge Instructions Activity:  As tolerated   Get Medicines reviewed and adjusted: Please take all your medications with you for your next visit with your Primary MD  Please request your Primary MD to go over all hospital tests and procedure/radiological results at the follow up, please ask your Primary MD to get all Hospital records sent to his/her office.  If you experience worsening of your admission symptoms, develop shortness of breath, life threatening emergency, suicidal or homicidal thoughts you must seek medical attention immediately by calling 911 or calling your MD immediately if symptoms less severe.  You must read complete instructions/literature along with all the possible adverse reactions/side effects for all the  Medicines you take and that have been prescribed to you. Take any new Medicines after you have completely understood and accpet all the possible adverse reactions/side effects.   Do not drive when taking Pain medications.   Do not take more than prescribed Pain, Sleep and Anxiety Medications  Special Instructions: If you have smoked or chewed  Tobacco in the last 2 yrs please stop smoking, stop any regular Alcohol and or any Recreational drug use.  Wear Seat belts while driving.  Please note  You were cared for by a hospitalist during your hospital stay. Once you are discharged, your primary care physician will handle any further medical issues. Please note that NO REFILLS for any discharge medications will be authorized once you are discharged, as it is imperative that you return to your primary care physician (or establish a relationship with a primary care physician if you do not have one) for your aftercare needs so that they can reassess your need for medications and monitor your lab values.    Medication List    TAKE these medications        aspirin 81 MG EC tablet  Take 81 mg by mouth daily.     calcium carbonate 600 MG Tabs tablet  Commonly known as:  OS-CAL  Take 600 mg by mouth every evening.     cefpodoxime 100 MG tablet  Commonly known as:  VANTIN  Take 1 tablet (100 mg total) by mouth 2 (two) times daily.     loratadine 10 MG tablet  Commonly known as:  CLARITIN  Take 10 mg by mouth daily.     losartan 50 MG tablet  Commonly known as:  COZAAR  Take 1 tablet (50 mg total) by mouth daily.     metoprolol succinate 25 MG 24 hr tablet  Commonly known as:  TOPROL-XL  TAKE 1 TABLET DAILY (CONTACT OFFICE TO SCHEDULE APPOINTMENT FOR FUTURE REFILLS, [phone])     mometasone-formoterol 100-5 MCG/ACT Aero  Commonly known as:  DULERA  Inhale 2 puffs into the lungs 2 (two) times daily.     nitroGLYCERIN 0.4 MG SL tablet  Commonly known as:  NITROSTAT  Place 1 tablet (0.4 mg total) under the tongue every 5 (five) minutes as needed. For chest pain     pantoprazole 40 MG tablet  Commonly known as:  PROTONIX  Take 1 tablet (40 mg total) by mouth daily.     RECLAST IV  Inject into the vein. Once Yearly     rosuvastatin 20 MG tablet  Commonly known as:  CRESTOR  TAKE 1 TABLET BY MOUTH AT BEDTIME            Follow-up Information    Follow up with [name], [name], MD. Schedule an appointment as soon as possible for a visit in 2 weeks.   Specialty:  Internal Medicine   Contact information:   [address] [place] [address] [phone]       Follow up with [facility], MD. Schedule an appointment as soon as possible for a visit in 1 week.   Specialty:  Urology   Contact information:   [address] [place] [address] [phone]       The results of significant diagnostics from this hospitalization (including imaging, microbiology, ancillary and laboratory) are listed below for reference.    Significant Diagnostic Studies: Dg Chest 2 View  03/21/2015  CLINICAL DATA:  Shortness of breath.  Chest pain.  Kidney  problems. EXAM: CHEST  2 VIEW COMPARISON:  11/15/2012 ; 03/04/2012; chest CT- 11/25/2012 FINDINGS: Grossly unchanged cardiac silhouette and mediastinal contours with tortuosity and potential slight ectasia of the thoracic aorta. Post coronary artery calcifications. The lungs remain hyperexpanded. No focal airspace opacities. No pleural effusion or pneumothorax. No evidence of edema. No acute osseus abnormalities. Post lower thoracic vertebral body augmentation. Unchanged moderate (approximately 40%) compression deformity involving the superior endplate of a mid thoracic vertebral body. IMPRESSION: Mild lung hyperexpansion without acute cardiopulmonary disease. Electronically Signed   By: Simonne ComeJohn  Watts M.D.   On: 03/21/2015 11:58   Ct Angio Chest Pe W/cm &/or Wo Cm  03/21/2015  CLINICAL DATA:  Right-sided chest pain and shortness of Breath EXAM: CT ANGIOGRAPHY CHEST WITH CONTRAST TECHNIQUE: Multidetector CT imaging of the chest was performed using the standard protocol during bolus administration of intravenous contrast. Multiplanar CT image reconstructions and MIPs were obtained to evaluate the vascular anatomy. CONTRAST:  100mL OMNIPAQUE IOHEXOL 350 MG/ML  SOLN COMPARISON:  None. FINDINGS: The lungs are well aerated bilaterally. Mild emphysematous changes are noted bilaterally. No focal infiltrate or sizable effusion is noted. Thoracic aorta is within normal limits. The pulmonary artery demonstrates a normal branching pattern without intraluminal filling defect to suggest pulmonary embolism. No hilar or mediastinal adenopathy is noted. Thoracic inlet is within normal limits. The bony structures show no acute abnormality. A chronic appearing compression deformity of T9 is noted. Review of the MIP images confirms the above findings. IMPRESSION: No acute abnormality noted. Electronically Signed   By: Alcide CleverMark  Lukens M.D.   On: 03/21/2015 13:20   [place] Renal  03/21/2015  CLINICAL DATA:  Acute right-sided flank pain. Right-sided hydronephrosis seen on preceding abdominal CT. EXAM: RENAL / URINARY TRACT ULTRASOUND COMPLETE COMPARISON:  CT abdomen pelvis - earlier same day; abdominal MRI - 07/13/2008 ; lumbar spine MRI -12/25/2008 FINDINGS: Right Kidney: Normal cortical thickness, echogenicity and size, measuring 11.9 cm in length. No focal renal lesions. No echogenic renal stones. Re- demonstrated moderate severe right-sided pelvicaliectasis, the etiology of which is not demonstrated on the present examination. A minimal amount of echogenic debris is noted within in the right renal pelvis (representative image 9). Note is made of a punctate (approximately 2.6 x 2.7 x 2.0 cm anechoic partially exophytic cyst arising from the superior pole the right kidney. Left Kidney: Normal cortical thickness, echogenicity and size, measuring 10.4 cm in length. No focal renal lesions. No echogenic renal stones. No urinary obstruction. Bladder: Appears normal for degree of bladder distention. Only a left-sided ureteral jet was visualized. IMPRESSION: 1. Moderate to severe right-sided pelvicaliectasis, the etiology of which is not depicted on the present examination. Note, an extrarenal pelvis  was demonstrated on remote lumbar spine MRI performed 12/2008 though was without the associated pelvicaliectasis demonstrated on the present examination. 2. Minimal amount of echogenic debris within the dilated right renal pelvis, nonspecific with differential considerations including evolving blood products and/or infection. 3. No evidence of left-sided urinary obstruction. Electronically Signed   By: Simonne ComeJohn  Watts M.D.   On: 03/21/2015 15:54   Ct Renal Stone Study  03/21/2015  CLINICAL DATA:  Right-sided abdominal pain EXAM: CT ABDOMEN AND PELVIS WITHOUT CONTRAST TECHNIQUE: Multidetector CT imaging of the abdomen and pelvis was performed following the standard protocol without IV contrast. COMPARISON:  05/18/2014 FINDINGS: Lung bases are free of acute infiltrate or sizable effusion. A small sliding-type hiatal hernia is noted. The liver, gallbladder, spleen, adrenal glands and pancreas are within normal limits with  the exception of a small hepatic calcification which may represent a granuloma. Left kidney shows no renal calculi or obstructive changes. The left ureter is within normal limits. The right kidney shows fullness of the collecting system which terminates at the you had UPJ consistent with a UPJ stenosis. No focal stone is identified. A renal cyst is again noted in the upper pole similar to that seen on the prior exam. The distal right ureter is within normal limits. The bladder is decompressed. Diverticular change of the colon is noted. No evidence of diverticulitis is seen. Diffuse aortoiliac calcifications are noted without aneurysmal dilatation. No pelvic mass lesion is seen. The osseous structures show postsurgical change in the left hip and lower lumbar spine. Changes of prior vertebral augmentation are noted. 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 This is worse than that seen on the prior exam. The remainder of the exam is stable from the prior  study. Electronically Signed   By: Alcide Clever M.D.   On: 03/21/2015 13:34    Microbiology: Recent Results (from the past 240 hour(s))  Urine culture     Status: None   Collection Time: 03/21/15 10:01 AM  Result Value Ref Range Status   Specimen Description URINE, RANDOM  Final   Special Requests NONE  Final   Culture MULTIPLE SPECIES PRESENT, SUGGEST RECOLLECTION  Final   Report Status 03/23/2015 FINAL  Final  Culture, blood (Routine X 2) w Reflex to ID Panel     Status: None (Preliminary result)   Collection Time: 03/21/15  7:45 PM  Result Value Ref Range Status   Specimen Description BLOOD RIGHT ANTECUBITAL  Final   Special Requests BOTTLES DRAWN AEROBIC AND ANAEROBIC 3CC  Final   Culture NO GROWTH 2 DAYS  Final   Report Status PENDING  Incomplete  Culture, blood (Routine X 2) w Reflex to ID Panel     Status: None (Preliminary result)   Collection Time: 03/21/15  8:00 PM  Result Value Ref Range Status   Specimen Description BLOOD RIGHT HAND  Final   Special Requests BOTTLES DRAWN AEROBIC ONLY 3CC  Final   Culture NO GROWTH 2 DAYS  Final   Report Status PENDING  Incomplete     Labs: Basic Metabolic Panel:  Recent Labs Lab 03/21/15 1035 03/22/15 0117 03/23/15 0600  NA 137 132* 137  K 4.9 3.9 3.9  CL 102 101 104  CO2 25 26 27   GLUCOSE 112* 128* 113*  BUN 16 14 9   CREATININE 0.83 0.80 0.64  CALCIUM 9.0 8.1* 8.3*   Liver Function Tests:  Recent Labs Lab 03/21/15 1035 03/22/15 0117  AST 23 20  ALT 16 14  ALKPHOS 61 57  BILITOT 0.8 0.4  PROT 6.5 5.9*  ALBUMIN 3.6 2.9*    Recent Labs Lab 03/21/15 1035  LIPASE 31   CBC:  Recent Labs Lab 03/21/15 1035 03/22/15 0117  WBC 12.6* 6.7  NEUTROABS 11.0*  --   HGB 13.9 12.2  HCT 40.5 37.1  MCV 102.3* 103.6*  PLT 232 231    Signed:  GHERGHE, COSTIN  [facility] 03/23/2015, 3:46 PM

## 2015-03-23 NOTE — Progress Notes (Signed)
Nsg Discharge Note  Admit Date:  03/21/2015 Discharge date: 03/23/2015   [name] to be D/C'd Home per MD order.  AVS completed.  Copy for chart, and copy for patient signed, and dated. Patient/caregiver able to verbalize understanding.  Discharge Medication:   Medication List    TAKE these medications        aspirin 81 MG EC tablet  Take 81 mg by mouth daily.     calcium carbonate 600 MG Tabs tablet  Commonly known as:  OS-CAL  Take 600 mg by mouth every evening.     cefpodoxime 100 MG tablet  Commonly known as:  VANTIN  Take 1 tablet (100 mg total) by mouth 2 (two) times daily.     loratadine 10 MG tablet  Commonly known as:  CLARITIN  Take 10 mg by mouth daily.     losartan 50 MG tablet  Commonly known as:  COZAAR  Take 1 tablet (50 mg total) by mouth daily.     metoprolol succinate 25 MG 24 hr tablet  Commonly known as:  TOPROL-XL  TAKE 1 TABLET DAILY (CONTACT OFFICE TO SCHEDULE APPOINTMENT FOR FUTURE REFILLS, [phone])     mometasone-formoterol 100-5 MCG/ACT Aero  Commonly known as:  DULERA  Inhale 2 puffs into the lungs 2 (two) times daily.     nitroGLYCERIN 0.4 MG SL tablet  Commonly known as:  NITROSTAT  Place 1 tablet (0.4 mg total) under the tongue every 5 (five) minutes as needed. For chest pain     pantoprazole 40 MG tablet  Commonly known as:  PROTONIX  Take 1 tablet (40 mg total) by mouth daily.     RECLAST IV  Inject into the vein. Once Yearly     rosuvastatin 20 MG tablet  Commonly known as:  CRESTOR  TAKE 1 TABLET BY MOUTH AT BEDTIME        Discharge Assessment: Filed Vitals:   03/23/15 0528 03/23/15 1345  BP: 146/72 135/85  Pulse: 64 69  Temp: 98.5 F (36.9 C) 98.2 F (36.8 C)  Resp: 16 18  Skin clean, dry and intact without evidence of skin break down, no evidence of skin tears noted. IV catheter discontinued intact. Site without signs and symptoms of complications - no redness or edema noted at insertion site, patient  denies c/o pain.  Dressing with slight pressure applied.  D/c Instructions-Education: Discharge instructions given to patient/family with verbalized understanding. D/c education completed with patient/family including follow up instructions, medication list, d/c activities limitations if indicated, with other d/c instructions as indicated by MD - patient able to verbalize understanding, all questions fully answered. Patient instructed to return to ED, call 911, or call MD for any changes in condition.  Patient escorted via WC, and D/C home via private auto.  Camillo FlamingVicki L Damon Hargrove, RN 03/23/2015 2:13 PM

## 2015-03-25 LAB — UIFE/LIGHT CHAINS/TP QN, 24-HR UR
% BETA, Urine: 19.2 %
ALPHA 1 URINE: 3.1 %
Albumin, U: 52.6 %
Alpha 2, Urine: 14.1 %
Free Kappa/Lambda Ratio: 9.48 (ref 2.04–10.37)
Free Lambda Lt Chains,Ur: 9.59 mg/L — ABNORMAL HIGH (ref 0.24–6.66)
Free Lt Chn Excr Rate: 90.9 mg/L — ABNORMAL HIGH (ref 1.35–24.19)
GAMMA GLOBULIN URINE: 11 %
Total Protein, Urine: 358.6 mg/dL

## 2015-03-26 LAB — CULTURE, BLOOD (ROUTINE X 2)
Culture: NO GROWTH
Culture: NO GROWTH

## 2015-03-29 ENCOUNTER — Other Ambulatory Visit: Payer: Self-pay | Admitting: Cardiology

## 2015-04-02 ENCOUNTER — Other Ambulatory Visit: Payer: Self-pay | Admitting: Urology

## 2015-04-02 ENCOUNTER — Other Ambulatory Visit (HOSPITAL_COMMUNITY): Payer: Medicare Other

## 2015-04-02 ENCOUNTER — Other Ambulatory Visit (HOSPITAL_COMMUNITY): Payer: Self-pay | Admitting: Urology

## 2015-04-02 DIAGNOSIS — N1330 Unspecified hydronephrosis: Secondary | ICD-10-CM

## 2015-04-04 ENCOUNTER — Ambulatory Visit (HOSPITAL_COMMUNITY)
Admission: RE | Admit: 2015-04-04 | Discharge: 2015-04-04 | Disposition: A | Discharge status: Home / Self Care | Payer: Medicare Other | Source: Ambulatory Visit | Attending: Urology | Admitting: Urology

## 2015-04-04 DIAGNOSIS — R93421 Abnormal radiologic findings on diagnostic imaging of right kidney: Secondary | ICD-10-CM | POA: Insufficient documentation

## 2015-04-04 DIAGNOSIS — N1330 Unspecified hydronephrosis: Secondary | ICD-10-CM | POA: Diagnosis present

## 2015-04-04 MED ORDER — FUROSEMIDE 10 MG/ML IJ SOLN
INTRAMUSCULAR | Status: AC
  Filled 2015-04-04: qty 4

## 2015-04-04 MED ORDER — FUROSEMIDE 10 MG/ML IJ SOLN
37.0000 mg | Freq: Once | INTRAMUSCULAR | Status: AC
  Administered 2015-04-04: 37 mg via INTRAVENOUS

## 2015-04-04 MED ORDER — TECHNETIUM TC 99M MERTIATIDE
15.3000 | Freq: Once | INTRAVENOUS | Status: AC | PRN
  Administered 2015-04-04: 15 via INTRAVENOUS

## 2015-05-09 ENCOUNTER — Encounter: Payer: Self-pay | Admitting: Physician Assistant

## 2015-05-09 ENCOUNTER — Ambulatory Visit (INDEPENDENT_AMBULATORY_CARE_PROVIDER_SITE_OTHER): Payer: Medicare Other | Admitting: Physician Assistant

## 2015-05-09 VITALS — BP 120/80 | HR 68 | Ht 65.5 in | Wt 160.2 lb

## 2015-05-09 DIAGNOSIS — I5032 Chronic diastolic (congestive) heart failure: Secondary | ICD-10-CM

## 2015-05-09 DIAGNOSIS — I2583 Coronary atherosclerosis due to lipid rich plaque: Secondary | ICD-10-CM

## 2015-05-09 DIAGNOSIS — E785 Hyperlipidemia, unspecified: Secondary | ICD-10-CM | POA: Diagnosis not present

## 2015-05-09 DIAGNOSIS — Z01818 Encounter for other preprocedural examination: Secondary | ICD-10-CM

## 2015-05-09 DIAGNOSIS — I2510 Atherosclerotic heart disease of native coronary artery without angina pectoris: Secondary | ICD-10-CM

## 2015-05-09 DIAGNOSIS — I10 Essential (primary) hypertension: Secondary | ICD-10-CM

## 2015-05-09 NOTE — Progress Notes (Signed)
Patient ID: [name], female   DOB: 1940-04-09, 75 y.o.   MRN: [id number]    Date:  05/09/2015   ID:  [name], DOB 06/08/1940, MRN [id number]  PCP:  [name] Sack, MD  Primary Cardiologist:  [place]  Chief Complaint  Patient presents with  . Medical Clearance    no chest pain, doing better- shortness of breath, no edema, no pain or cramping in legs, no lightheadedness or dizziness     History of Present Illness:  [name] is a 75 y.o. female with a history of coronary disease. 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 She had stenting of the LAD with a bare-metal stent. Initial ejection fraction was 40-45%. This improved to 55% by echo in November 2013. She was admitted in August 2014 with symptoms of nausea vomiting and chest pain. Cardiac catheterization demonstrated continued patency of the stent in the mid LAD. There is a 70% stenosis in the ramus intermediate branch which was unchanged. Otherwise nonobstructive disease. Ejection fraction was 65%. EDP was 9 mmHg. She was treated medically. She also has a history of hypertension, hyperlipidemia and COPD. She has a past history of tobacco use. Quit 30 years ago.   [name] present for preoperative clearance.  She was recently hospitalized with right hydronephrosis with stenosis of the ureteropelvic junction.  She was treated with antibiotics.   Her urologist and her are considering placing a stent.  She reports to me that she is leaning towards just observation.  Up until, and into, December she was going to a one hour water aerobics class 2-3 times per week without difficulties or CP.  She only stopped because of the recurrent UTI issues.  She reports feeling better today. No abd pain, dysuria, CP, SOB, orthopnea as well as vomiting, fever, chest pain, dizziness, PND, cough, congestion, hematochezia, melena, lower extremity edema, claudication.  Wt Readings from Last 3  Encounters:  05/09/15 160 lb 3 oz (72.661 kg)  04/04/15 161 lb (73.029 kg)  03/23/15 161 lb 14.4 oz (73.437 kg)     Past Medical History  Diagnosis Date  . Coronary artery disease 08-14-09    a. 08/2009: Ant MI c/b v-fib arrest, s/p PTCA/BMS to LAD.  Marland Kitchen Ventricular fibrillation (HCC)     a. 08/2009: due to anterior MI.  Marland Kitchen GERD (gastroesophageal reflux disease)   . Osteoarthritis   . Osteopenia   . Rib fractures     left sided second and sixth rib   . Compression fracture     T9  . PONV (postoperative nausea and vomiting)   . Ischemic cardiomyopathy     a. EF 40-45% in 08/2009 at time of MI. b. Improved to 55-60% by June 2011.  Marland Kitchen Postoperative groin pseudoaneurysm (HCC)     a. 04/2010 following diagnostic cardiac cath, s/p compression.  Marland Kitchen COPD (chronic obstructive pulmonary disease) (HCC)   . HTN (hypertension)   . Hyperlipidemia     Current Outpatient Prescriptions  Medication Sig Dispense Refill  . aspirin 81 MG EC tablet Take 81 mg by mouth daily.      . budesonide-formoterol (SYMBICORT) 160-4.5 MCG/ACT inhaler Inhale 1 puff into the lungs daily.    . calcium carbonate (OS-CAL) 600 MG TABS tablet Take 600 mg by mouth every evening.     . loratadine (CLARITIN) 10 MG tablet Take 10 mg by mouth daily.    Marland Kitchen losartan (COZAAR) 50 MG tablet Take 1 tablet (50  mg total) by mouth daily. 30 tablet 3  . metoprolol succinate (TOPROL-XL) 25 MG 24 hr tablet TAKE 1 TABLET DAILY (CONTACT OFFICE TO SCHEDULE APPOINTMENT FOR FUTURE REFILLS, [phone]) 90 tablet 1  . nitroGLYCERIN (NITROSTAT) 0.4 MG SL tablet Place 1 tablet (0.4 mg total) under the tongue every 5 (five) minutes as needed. For chest pain 25 tablet 1  . pantoprazole (PROTONIX) 40 MG tablet Take 1 tablet (40 mg total) by mouth daily. 90 tablet 0  . rosuvastatin (CRESTOR) 20 MG tablet TAKE 1 TABLET BY MOUTH AT BEDTIME 90 tablet 1  . Zoledronic Acid (RECLAST IV) Inject into the vein. Once Yearly     No current facility-administered  medications for this visit.    Allergies:    Allergies  Allergen Reactions  . Codeine Nausea And Vomiting  . Imdur [Isosorbide Dinitrate] Nausea Only    Headache  . Sulfonamide Derivatives Nausea And Vomiting    Social History:  The patient  reports that she has quit smoking. She has never used smokeless tobacco. She reports that she drinks alcohol. She reports that she does not use illicit drugs.   Family history:  No family history on file.  ROS:  Please see the history of present illness.  All other systems reviewed and negative.   PHYSICAL EXAM: VS:  BP 120/80 mmHg  Pulse 68  Ht 5' 5.5" (1.664 m)  Wt 160 lb 3 oz (72.661 kg)  BMI 26.24 kg/m2 Well nourished, well developed, in no acute distress HEENT: Pupils are equal round react to light accommodation extraocular movements are intact.  Neck: no JVDNo cervical lymphadenopathy. Cardiac: Regular rate and rhythm without murmurs rubs or gallops. Lungs:  clear to auscultation bilaterally, no wheezing, rhonchi or rales Abd: soft, nontender, positive bowel sounds all quadrants, no hepatosplenomegaly Ext: no lower extremity edema.  2+ radial and dorsalis pedis pulses. Skin: warm and dry Neuro:  Grossly normal  EKG:     She appears in sinus rhythm with intermittent junctional beats. At 60 bpm essentially unchanged from prior EKG  ASSESSMENT AND PLAN:  Problem List Items Addressed This Visit    Preoperative clearance   Hyperlipidemia   HTN (hypertension)   Chronic diastolic heart failure (HCC) - Primary   Relevant Orders   EKG 12-Lead   CAD (coronary artery disease)     [name] appears to be doing well from a cardiac standpoint. Up until December she was exercising 2-3 times a week at water aerobics for an hour each time without any chest pain. The reason she did stop this because of recurrent urinary tract issues.  Last cardiac cath was August 2014 at which time she had two-vessel coronary disease with a patent mid LAD  stent with moderate stenosis in the intermediate branch-70%. Did not appear to be any change from prior catheterization.  This Gessel in her urologist are considering inserting a ureter stent but she is leaning more towards observation.  Given its been 2-1/2 years since he ischemic workup, a nuclear stress test would be reasonable as preop workup.  If she decides to go through with the surgery, she will call and schedule the stress test.   Blood pressures well-controlled. She continues on statin for her lipids. She appears euvolemic on exam and her weight is stable.

## 2015-05-09 NOTE — Patient Instructions (Signed)
Medication Instructions:  Your physician recommends that you continue on your current medications as directed. Please refer to the Current Medication list given to you today.   Labwork: -None  Testing/Procedures: -None  Follow-Up: Your physician recommends that you keep your scheduled  follow-up appointment with Dr. Thomasene Lot.   Any Other Special Instructions Will Be Listed Below (If Applicable).     If you need a refill on your cardiac medications before your next appointment, please call your pharmacy.

## 2015-05-17 ENCOUNTER — Other Ambulatory Visit: Payer: Self-pay | Admitting: Cardiology

## 2015-05-20 ENCOUNTER — Ambulatory Visit: Payer: Medicare Other | Admitting: Cardiology

## 2015-06-10 ENCOUNTER — Other Ambulatory Visit (HOSPITAL_COMMUNITY): Payer: Self-pay | Admitting: *Deleted

## 2015-06-11 ENCOUNTER — Ambulatory Visit (HOSPITAL_COMMUNITY)
Admission: RE | Admit: 2015-06-11 | Discharge: 2015-06-11 | Disposition: A | Discharge status: Home / Self Care | Payer: Medicare Other | Source: Ambulatory Visit | Attending: Rheumatology | Admitting: Rheumatology

## 2015-06-11 DIAGNOSIS — M810 Age-related osteoporosis without current pathological fracture: Secondary | ICD-10-CM | POA: Diagnosis present

## 2015-06-11 MED ORDER — ZOLEDRONIC ACID 5 MG/100ML IV SOLN
5.0000 mg | Freq: Once | INTRAVENOUS | Status: AC
  Administered 2015-06-11: 5 mg via INTRAVENOUS

## 2015-06-11 MED ORDER — ZOLEDRONIC ACID 5 MG/100ML IV SOLN
INTRAVENOUS | Status: AC
  Filled 2015-06-11: qty 100

## 2015-07-23 ENCOUNTER — Other Ambulatory Visit: Payer: Self-pay | Admitting: *Deleted

## 2015-07-23 MED ORDER — METOPROLOL SUCCINATE ER 25 MG PO TB24: 25.0000 mg | ORAL_TABLET | Freq: Every day | ORAL | Status: DC

## 2015-07-28 ENCOUNTER — Other Ambulatory Visit: Payer: Self-pay | Admitting: Cardiology

## 2015-08-07 ENCOUNTER — Other Ambulatory Visit: Payer: Medicare Other

## 2015-08-07 ENCOUNTER — Encounter: Payer: Self-pay | Admitting: Internal Medicine

## 2015-08-07 ENCOUNTER — Ambulatory Visit (INDEPENDENT_AMBULATORY_CARE_PROVIDER_SITE_OTHER): Payer: Medicare Other | Admitting: Internal Medicine

## 2015-08-07 VITALS — BP 120/80 | HR 64 | Ht 65.5 in | Wt 164.0 lb

## 2015-08-07 DIAGNOSIS — R0600 Dyspnea, unspecified: Secondary | ICD-10-CM

## 2015-08-07 DIAGNOSIS — J449 Chronic obstructive pulmonary disease, unspecified: Secondary | ICD-10-CM | POA: Diagnosis not present

## 2015-08-07 DIAGNOSIS — I2510 Atherosclerotic heart disease of native coronary artery without angina pectoris: Secondary | ICD-10-CM | POA: Diagnosis not present

## 2015-08-07 MED ORDER — FAMOTIDINE 20 MG PO TABS: ORAL_TABLET | ORAL | Status: DC

## 2015-08-07 NOTE — Patient Instructions (Signed)
Add pepcid 20 mg at bedtime to the protonix before bfast  Stop symbicort but if breathing worse restart except try no to take it am of test   GERD (REFLUX)  is an extremely common cause of respiratory symptoms just like yours , many times with no obvious heartburn at all.    It can be treated with medication, but also with lifestyle changes including elevation of the head of your bed (ideally with 6 inch  bed blocks),  Smoking cessation, avoidance of late meals, excessive alcohol, and avoid fatty foods, chocolate, peppermint, colas, red wine, and acidic juices such as orange juice.  NO MINT OR MENTHOL PRODUCTS SO NO COUGH DROPS  USE SUGARLESS CANDY INSTEAD (Jolley ranchers or Stover's or Life Savers) or even ice chips will also do - the key is to swallow to prevent all throat clearing. NO OIL BASED VITAMINS - use powdered substitutes.   Please remember to go to the lab department downstairs for your tests - we will call you with the results when they are available.    Please schedule a follow up office visit in 6 weeks, call sooner if needed with pfts ok to push back a few weeks if needed

## 2015-08-07 NOTE — Progress Notes (Signed)
Subjective:    Patient ID: [name], female    DOB: 04/23/1940,     MRN: [id number]  HPI  [age] yowf quit smoking in 1986 with am throat congestion and felt fine until around 2012 with onset of doe so referred to pulmonary clinic 08/07/2015 by Dr Renae FickleEd Chilton SiGreen  08/07/2015 1st [facility] office visit/ Kristofor Michalowski   Chief Complaint  Patient presents with  . Pulmonary Consult    Referred by Dr. Nila NephewEdwin Green. Pt c/o SOB x 5 yrs- worse x 4 months. She is SOB with grocery shopping or lifting things. She also c/o occ PND and am cough- yellow to clear sputum.   indolent onset doe x 5 years  To point where has trouble carrying groceries from car to house and up x 3 steps and no real change on dulera or symbicort to date - thought advair workded better. Breathing Ok at hs and in am Does fine if rests or paces  Problems with doe also while vacuuming for more than 5 min- note Sometimes gags and heaves during coughing fits she attributes to pnds , esp in am but otherwise no excess/ purulent sputum or mucus plugs    No obvious other patterns in day to day or daytime variabilty or assoc   cp or chest tightness, subjective wheeze overt  hb symptoms. No unusual exp hx or h/o childhood pna/ asthma or knowledge of premature birth.  Sleeping ok without nocturnal  or early am exacerbation  of respiratory  c/o's or need for noct saba. Also denies any obvious fluctuation of symptoms with weather or environmental changes or other aggravating or alleviating factors except as outlined above   Current Medications, Allergies, Complete Past Medical History, Past Surgical History, Family History, and Social History were reviewed in [facility] electronic medical record.                Review of Systems  Constitutional: Negative for fever, chills and unexpected weight change.  HENT: Positive for postnasal drip. Negative for congestion, dental problem, ear pain, nosebleeds, rhinorrhea, sinus pressure, sneezing, sore  throat, trouble swallowing and voice change.   Eyes: Negative for visual disturbance.  Respiratory: Positive for cough and shortness of breath. Negative for choking.   Cardiovascular: Negative for chest pain and leg swelling.  Gastrointestinal: Negative for vomiting, abdominal pain and diarrhea.  Genitourinary: Negative for difficulty urinating.  Musculoskeletal: Negative for arthralgias.  Skin: Negative for rash.  Neurological: Negative for tremors, syncope and headaches.  Hematological: Does not bruise/bleed easily.       Objective:   Physical Exam  amb wf nad   Wt Readings from Last 3 Encounters:  08/07/15 164 lb (74.39 kg)  06/11/15 160 lb (72.576 kg)  05/09/15 160 lb 3 oz (72.661 kg)    Vital signs reviewed   HEENT: nl  turbinates, and oropharynx. Nl external ear canals without cough reflex - upper dentures   NECK :  without JVD/Nodes/TM/ nl carotid upstrokes bilaterally   LUNGS: moderate kyphotic deformity/  no acc muscle use,  Nl contour chest which is clear to A and P bilaterally without cough on insp or exp maneuvers   CV:  RRR  no s3 or murmur or increase in P2, no edema   ABD:  soft and nontender with nl inspiratory excursion in the supine position. No bruits or organomegaly, bowel sounds nl  MS:  Nl gait/ ext warm without deformities, calf tenderness, cyanosis or clubbing No obvious joint restrictions  SKIN: warm and dry without lesions    NEURO:  alert, approp, nl sensorium with  no motor deficits     I personally reviewed images and agree with radiology impression as follows:  CT Chest   03/21/15 The lungs are well aerated bilaterally. Mild emphysematous changes are noted bilaterally. No focal infiltrate or sizable effusion is noted.  Thoracic aorta is within normal limits. The pulmonary artery demonstrates a normal branching pattern without intraluminal filling defect to suggest pulmonary embolism. No hilar or mediastinal adenopathy is noted.  Thoracic inlet is within normal limits.       Assessment & Plan:

## 2015-08-08 DIAGNOSIS — R0609 Other forms of dyspnea: Secondary | ICD-10-CM

## 2015-08-08 NOTE — Assessment & Plan Note (Addendum)
Symptoms are  disproportionate to objective findings and not clear this is a lung problem but pt does appear to have difficult airway management issues. DDX of  difficult airways management almost all start with A and  include Adherence, Ace Inhibitors, Acid Reflux, Active Sinus Disease, Alpha 1 Antitripsin deficiency, Anxiety masquerading as Airways dz,  ABPA,  Allergy(esp in young), Aspiration (esp in elderly), Adverse effects of meds,  Active smokers, A bunch of PE's (a small clot burden can't cause this syndrome unless there is already severe underlying pulm or vascular dz with poor reserve) plus two Bs  = Bronchiectasis and Beta blocker use..and one C= CHF  Adherence is always the initial "prime suspect" and is a multilayered concern that requires a "trust but verify" approach in every patient - starting with knowing how to use medications, especially inhalers, correctly, keeping up with refills and understanding the fundamental difference between maintenance and prns vs those medications only taken for a very short course and then stopped and not refilled.   ? Acid (or non-acid) GERD > always difficult to exclude as up to 75% of pts in some series report no assoc GI/ Heartburn symptoms> rec max (24h)  acid suppression and diet restrictions/ reviewed and instructions given in writing.   ? Allergies/ asthma component > no obvious atopic features, try off symbicort trial basis pending repeat pfts here  ? Alpha one def based on fm hx mulitple members with copd > check phenotype/ levels  ? Active sinus dz/pnds > ok to just use otc antihistamines for now, consider sinus ct later if am cough to gagging continues though note it does not disturb sleep or actually wake her in am making sinus dz less likely   ? 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

## 2015-08-08 NOTE — Assessment & Plan Note (Signed)
Prev pfts inconclusive but if there is airflow obst, it is minimal  NB  When respiratory symptoms begin or become refractory well after a patient reports complete smoking cessation,  Especially when this wasn't the case while they were smoking, a red flag is raised based on the work of Dr Primitivo GauzeFletcher which states:  if you quit smoking when your best day FEV1 is still well preserved it is highly unlikely you will progress to severe disease.  That is to say, once the smoking stops,  the symptoms should not suddenly erupt or markedly worsen.  If so, the differential diagnosis should include  obesity/deconditioning,  LPR/Reflux/Aspiration syndromes,  occult CHF, or  especially side effect of medications commonly used in this population.    Since not convinced the symbicort is helping will try off / restart if worse

## 2015-08-12 LAB — ALPHA-1-ANTITRYPSIN: A-1 Antitrypsin, Ser: 146 mg/dL (ref 83–199)

## 2015-08-13 ENCOUNTER — Other Ambulatory Visit: Payer: Self-pay | Admitting: Cardiology

## 2015-08-16 LAB — ALPHA-1 ANTITRYPSIN PHENOTYPE: A-1 Antitrypsin: 136 mg/dL (ref 83–199)

## 2015-08-16 NOTE — Progress Notes (Signed)
Quick Note:  Spoke with pt and notified of results per Dr. Wert. Pt verbalized understanding and denied any questions.  ______ 

## 2015-08-22 ENCOUNTER — Ambulatory Visit (INDEPENDENT_AMBULATORY_CARE_PROVIDER_SITE_OTHER): Payer: Medicare Other | Admitting: Cardiology

## 2015-08-22 ENCOUNTER — Encounter: Payer: Self-pay | Admitting: Cardiology

## 2015-08-22 VITALS — BP 126/70 | HR 64 | Ht 65.0 in | Wt 163.0 lb

## 2015-08-22 DIAGNOSIS — I10 Essential (primary) hypertension: Secondary | ICD-10-CM | POA: Diagnosis not present

## 2015-08-22 DIAGNOSIS — I2510 Atherosclerotic heart disease of native coronary artery without angina pectoris: Secondary | ICD-10-CM | POA: Diagnosis not present

## 2015-08-22 DIAGNOSIS — E785 Hyperlipidemia, unspecified: Secondary | ICD-10-CM | POA: Diagnosis not present

## 2015-08-22 MED ORDER — NITROGLYCERIN 0.4 MG SL SUBL: 0.4000 mg | SUBLINGUAL_TABLET | SUBLINGUAL | Status: AC | PRN

## 2015-08-22 NOTE — Patient Instructions (Signed)
Continue your current therapy  I will see you in one year   

## 2015-08-22 NOTE — Progress Notes (Signed)
08/22/2015 [name]   04/15/1940  [id number]  Primary Physician GREEN, Lorenda [name], MD Primary Cardiologist: Dr. SwazilandJordan  HPI:  The patient is a 75 year old female who is seen for follow up CAD. She has a history of coronary disease. 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 She had stenting of the LAD with a bare-metal stent. Initial ejection fraction was 40-45%. This improved to 55% by echo in November 2013. She had a normal Myoview in 2013. She was admitted in August 2014 with symptoms of nausea vomiting and chest pain. Cardiac catheterization demonstrated continued patency of the stent in the mid LAD. There is a 70% stenosis in the ramus intermediate branch which was unchanged. Otherwise nonobstructive disease. Ejection fraction was 65%. EDP was 9 mmHg. She was treated medically. She also has a history of hypertension, hyperlipidemia and COPD. She has a past history of tobacco use. Quit 30 years ago.   On follow up today she is doing well. She did see Dr. Sherene SiresWert with complaints of SOB on exertion. He felt her pulmonary function was "normal" for age and prior smoking. She has no edema or orthopnea. No chest pain. She is active doing water aerobics. She was seen earlier this year for preoperative clearance for a urologic procedure for ureteral stone but this was never done.    Current Outpatient Prescriptions  Medication Sig Dispense Refill  . aspirin 81 MG EC tablet Take 81 mg by mouth daily.      . calcium carbonate (OS-CAL) 600 MG TABS tablet Take 600 mg by mouth every evening.     . famotidine (PEPCID) 20 MG tablet One at bedtime    . loratadine (CLARITIN) 10 MG tablet Take 10 mg by mouth daily as needed.     Marland Kitchen. losartan (COZAAR) 50 MG tablet Take 1 tablet (50 mg total) by mouth daily. 30 tablet 3  . metoprolol succinate (TOPROL-XL) 25 MG 24 hr tablet Take 1 tablet (25 mg total) by mouth daily. 90 tablet 2  . nitroGLYCERIN  (NITROSTAT) 0.4 MG SL tablet Place 1 tablet (0.4 mg total) under the tongue every 5 (five) minutes as needed. For chest pain 25 tablet 1  . pantoprazole (PROTONIX) 40 MG tablet Take 1 tablet (40 mg total) by mouth daily. 90 tablet 1  . rosuvastatin (CRESTOR) 20 MG tablet TAKE 1 TABLET AT BEDTIME 90 tablet 0  . traMADol (ULTRAM) 50 MG tablet Take 50 mg by mouth every 6 (six) hours as needed.    . Zoledronic Acid (RECLAST IV) Inject into the vein. Once Yearly     No current facility-administered medications for this visit.    Allergies  Allergen Reactions  . Codeine Nausea And Vomiting  . Imdur [Isosorbide Dinitrate] Nausea Only    Headache  . Sulfonamide Derivatives Nausea And Vomiting    Social History   Social History  . Marital Status: Married    Spouse Name: N/A  . Number of Children: N/A  . Years of Education: N/A   Occupational History  . retired Clinical research associatewriter    Social History Main Topics  . Smoking status: Former Smoker -- 2.00 packs/day for 20 years    Types: Cigarettes    Quit date: 04/06/1984  . Smokeless tobacco: Never Used  . Alcohol Use: 0.0 oz/week    0 Standard drinks or equivalent per week     Comment: No history of alcohol abuse  . Drug Use: No  .  Sexual Activity: No   Other Topics Concern  . Not on file   Social History Narrative     Review of Systems: As noted in HPI.  All other systems reviewed and are otherwise negative except as noted above.    Blood pressure 126/70, pulse 64, height  (1.651 m), weight 73.936 kg (163 lb).  GENERAL:  Well appearing WF in NAD HEENT:  PERRL, EOMI, sclera are clear. Oropharynx is clear. NECK:  No jugular venous distention, carotid upstroke brisk and symmetric, no bruits, no thyromegaly or adenopathy LUNGS:  Clear to auscultation bilaterally CHEST:  Unremarkable HEART:  RRR,  PMI not displaced or sustained,S1 and S2 within normal limits, no S3, no S4: no clicks, no rubs, no murmurs ABD:  Soft, nontender. BS +,  no masses or bruits. No hepatomegaly, no splenomegaly EXT:  2 + pulses throughout, no edema, no cyanosis no clubbing SKIN:  Warm and dry.  No rashes NEURO:  Alert and oriented x 3. Cranial nerves II through XII intact. PSYCH:  Cognitively intact    Laboratory data:  Lab Results  Component Value Date   WBC 6.7 03/22/2015   HGB 12.2 03/22/2015   HCT 37.1 03/22/2015   PLT 231 03/22/2015   GLUCOSE 113* 03/23/2015   CHOL 176 11/20/2010   TRIG 50.0 11/20/2010   HDL 111.60 11/20/2010   LDLDIRECT 134.9 11/21/2007   LDLCALC 54 11/20/2010   ALT 14 03/22/2015   AST 20 03/22/2015   NA 137 03/23/2015   K 3.9 03/23/2015   CL 104 03/23/2015   CREATININE 0.64 03/23/2015   BUN 9 03/23/2015   CO2 27 03/23/2015   TSH 1.232 03/21/2015   INR 1.05 11/16/2012   HGBA1C * 08/14/2009    5.7 (NOTE)                                                                       According to the ADA Clinical Practice Recommendations for 2011, when HbA1c is used as a screening test:   >=6.5%   Diagnostic of Diabetes Mellitus           (if abnormal result  is confirmed)  5.7-6.4%   Increased risk of developing Diabetes Mellitus  References:Diagnosis and Classification of Diabetes Mellitus,Diabetes Care,2011,34(Suppl 1):S62-S69 and Standards of Medical Care in         Diabetes - 2011,Diabetes Care,2011,34  (Suppl 1):S11-S61.     ASSESSMENT AND PLAN:   1. CAD: prior anterior myocardial infarction in 2011. Status post bare-metal stent to the LAD. Patent stent and stable CAD on repeat cath in 2014. No recurrent CP.  Continue medical therapy with ASA, BB, ARB and statin.  2. Hyperlipidemia: Followed by PCP. Continue statin therapy with crestor.   3. HTN: well controlled on current regimen  PLAN  recommend follow-up with Dr. Swaziland in one year  Peter Swaziland MD,FACC  08/22/2015 12:18 PM

## 2015-09-04 ENCOUNTER — Ambulatory Visit
Admission: RE | Admit: 2015-09-04 | Discharge: 2015-09-04 | Disposition: A | Discharge status: Home / Self Care | Payer: Medicare Other | Source: Ambulatory Visit | Attending: Internal Medicine | Admitting: Internal Medicine

## 2015-09-04 ENCOUNTER — Other Ambulatory Visit: Payer: Self-pay | Admitting: Internal Medicine

## 2015-09-04 DIAGNOSIS — M79604 Pain in right leg: Secondary | ICD-10-CM

## 2015-09-04 DIAGNOSIS — W19XXXA Unspecified fall, initial encounter: Secondary | ICD-10-CM

## 2015-10-09 ENCOUNTER — Ambulatory Visit: Payer: Medicare Other | Admitting: Internal Medicine

## 2015-10-28 ENCOUNTER — Other Ambulatory Visit: Payer: Self-pay | Admitting: Cardiology

## 2016-01-26 ENCOUNTER — Other Ambulatory Visit: Payer: Self-pay | Admitting: Cardiology

## 2016-02-10 ENCOUNTER — Other Ambulatory Visit: Payer: Self-pay | Admitting: Cardiology

## 2016-03-10 ENCOUNTER — Emergency Department (HOSPITAL_COMMUNITY): Payer: Medicare Other

## 2016-03-10 ENCOUNTER — Emergency Department (HOSPITAL_COMMUNITY)
Admission: EM | Admit: 2016-03-10 | Discharge: 2016-03-10 | Disposition: A | Discharge status: Home / Self Care | Payer: Medicare Other | Attending: Physician Assistant | Admitting: Physician Assistant

## 2016-03-10 ENCOUNTER — Encounter (HOSPITAL_COMMUNITY): Payer: Self-pay | Admitting: *Deleted

## 2016-03-10 DIAGNOSIS — G252 Other specified forms of tremor: Secondary | ICD-10-CM

## 2016-03-10 DIAGNOSIS — J449 Chronic obstructive pulmonary disease, unspecified: Secondary | ICD-10-CM | POA: Diagnosis not present

## 2016-03-10 DIAGNOSIS — Z79899 Other long term (current) drug therapy: Secondary | ICD-10-CM | POA: Diagnosis not present

## 2016-03-10 DIAGNOSIS — I2510 Atherosclerotic heart disease of native coronary artery without angina pectoris: Secondary | ICD-10-CM | POA: Insufficient documentation

## 2016-03-10 DIAGNOSIS — I110 Hypertensive heart disease with heart failure: Secondary | ICD-10-CM | POA: Diagnosis not present

## 2016-03-10 DIAGNOSIS — Z7982 Long term (current) use of aspirin: Secondary | ICD-10-CM | POA: Diagnosis not present

## 2016-03-10 DIAGNOSIS — M542 Cervicalgia: Secondary | ICD-10-CM | POA: Diagnosis present

## 2016-03-10 DIAGNOSIS — Z87891 Personal history of nicotine dependence: Secondary | ICD-10-CM | POA: Diagnosis not present

## 2016-03-10 DIAGNOSIS — I5032 Chronic diastolic (congestive) heart failure: Secondary | ICD-10-CM | POA: Diagnosis not present

## 2016-03-10 LAB — CBC WITH DIFFERENTIAL/PLATELET
Basophils Absolute: 0 10*3/uL (ref 0.0–0.1)
Basophils Relative: 0 %
Eosinophils Absolute: 0 10*3/uL (ref 0.0–0.7)
Eosinophils Relative: 0 %
HCT: 36.3 % (ref 36.0–46.0)
Hemoglobin: 12.3 g/dL (ref 12.0–15.0)
Lymphocytes Relative: 16 %
Lymphs Abs: 0.9 10*3/uL (ref 0.7–4.0)
MCH: 33.6 pg (ref 26.0–34.0)
MCHC: 33.9 g/dL (ref 30.0–36.0)
MCV: 99.2 fL (ref 78.0–100.0)
Monocytes Absolute: 0.6 10*3/uL (ref 0.1–1.0)
Monocytes Relative: 12 %
Neutro Abs: 3.9 10*3/uL (ref 1.7–7.7)
Neutrophils Relative %: 72 %
Platelets: 226 10*3/uL (ref 150–400)
RBC: 3.66 MIL/uL — ABNORMAL LOW (ref 3.87–5.11)
RDW: 12.3 % (ref 11.5–15.5)
WBC: 5.4 10*3/uL (ref 4.0–10.5)

## 2016-03-10 LAB — COMPREHENSIVE METABOLIC PANEL
ALT: 18 U/L (ref 14–54)
AST: 24 U/L (ref 15–41)
Albumin: 3.4 g/dL — ABNORMAL LOW (ref 3.5–5.0)
Alkaline Phosphatase: 59 U/L (ref 38–126)
Anion gap: 3 — ABNORMAL LOW (ref 5–15)
BUN: 8 mg/dL (ref 6–20)
CO2: 32 mmol/L (ref 22–32)
Calcium: 8.7 mg/dL — ABNORMAL LOW (ref 8.9–10.3)
Chloride: 98 mmol/L — ABNORMAL LOW (ref 101–111)
Creatinine, Ser: 0.74 mg/dL (ref 0.44–1.00)
GFR calc Af Amer: >60 mL/min (ref >60)
GFR calc non Af Amer: >60 mL/min (ref >60)
Glucose, Bld: 119 mg/dL — ABNORMAL HIGH (ref 65–99)
Potassium: 4.1 mmol/L (ref 3.5–5.1)
Sodium: 133 mmol/L — ABNORMAL LOW (ref 135–145)
Total Bilirubin: 0.6 mg/dL (ref 0.3–1.2)
Total Protein: 6.6 g/dL (ref 6.5–8.1)

## 2016-03-10 LAB — I-STAT CG4 LACTIC ACID, ED: Lactic Acid, Venous: 0.81 mmol/L (ref 0.5–1.9)

## 2016-03-10 MED ORDER — SODIUM CHLORIDE 0.9 % IV BOLUS (SEPSIS)
1000.0000 mL | Freq: Once | INTRAVENOUS | Status: AC
  Administered 2016-03-10: 1000 mL via INTRAVENOUS

## 2016-03-10 NOTE — ED Notes (Signed)
Called MRI to inquire when pt will go for scan. MRI reports there are currently no scanners open. Dr. Criss AlvineGoldston and pt informed.

## 2016-03-10 NOTE — ED Triage Notes (Signed)
Pt arrives from home via [facility]. Pt states she was dx with bronchitis yesterday and started on levaquin. Pt reports that upon waking today she was having a stiff neck, h/a and shaking. Pt also has c/o nausea which was relieved by 4mg  of zofran.

## 2016-03-10 NOTE — Discharge Instructions (Signed)
Your MRI showed no evidence of stroke today. Please follow up with her primary care physician.

## 2016-03-10 NOTE — ED Provider Notes (Addendum)
[facility]PT Provider Note   CSN: [id number] Arrival date & time: 03/10/16  1100     History   Chief Complaint Chief Complaint  Patient presents with  . Neck Pain    HPI [name] is a 75 y.o. female.  HPI  75 year old female presents for an evaluation of left arm shaking. She states that she was eating breakfast around 9 AM and she had trouble holding her spoon in her entire left arm was shaking. She is left-handed. She does not think her right side was shaking her legs were not shaking. She also states her head was shaking. This was on off for about 15 minutes. After this she had a pain shot from her right trapezius upper neck into the back of her head. She has continued to have a headache since then. She has also had cough and bronchitis for 5 days. Has had congestion. Mild headache during this time. No fevers. She was started on Levaquin yesterday by her PCP. No prior history of seizures. She does not feel focal weakness on one side of the other. She states her neck feels stiff although she states she has full range of motion and seems indicate that it means pain.  Past Medical History:  Diagnosis Date  . Compression fracture    T9  . COPD (chronic obstructive pulmonary disease) (HCC)   . Coronary artery disease 08-14-09   a. 08/2009: Ant MI c/b v-fib arrest, s/p PTCA/BMS to LAD.  Marland Kitchen. GERD (gastroesophageal reflux disease)   . HTN (hypertension)   . Hyperlipidemia   . Ischemic cardiomyopathy    a. EF 40-45% in 08/2009 at time of MI. b. Improved to 55-60% by June 2011.  . Osteoarthritis   . Osteopenia   . PONV (postoperative nausea and vomiting)   . Postoperative groin pseudoaneurysm (HCC)    a. 04/2010 following diagnostic cardiac cath, s/p compression.  . Rib fractures    left sided second and sixth rib   . Ventricular fibrillation (HCC)    a. 08/2009: due to anterior MI.    Patient Active Problem List   Diagnosis Date Noted  . Dyspnea 08/08/2015  . Preoperative  clearance 05/09/2015  . Atypical chest pain 03/21/2015  . Acute UTI 03/21/2015  . Hydronephrosis, right 03/21/2015  . Proteinuria 03/21/2015  . HTN (hypertension) 03/21/2015  . Chronic diastolic heart failure (HCC) 03/21/2015  . 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 Unstable angina (HCC) 11/16/2012  . Palpitations 07/22/2010  . COPD GOLD I  05/30/2010  . Hyperlipidemia 08/27/2009  . AMI 08/27/2009  . CAD (coronary artery disease) 08/27/2009  . VENTRICULAR FIBRILLATION 08/14/2009  . COMPRESSION FRACTURE, LUMBAR VERTEBRAE 12/21/2008  . SPINAL STENOSIS, LUMBAR 08/31/2008  . Macrocytic anemia 08/30/2008  . ABDOMINAL MASS 07/11/2008  . UNSPECIFIED VITAMIN D DEFICIENCY 03/09/2008  . GERD 03/09/2008  . OSTEOARTHRITIS 11/21/2007  . OSTEOPENIA 11/21/2007    Past Surgical History:  Procedure Laterality Date  . APPENDECTOMY    . BACK SURGERY    . LEFT HEART CATHETERIZATION WITH CORONARY ANGIOGRAM N/A 11/16/2012   Procedure: LEFT HEART CATHETERIZATION WITH CORONARY ANGIOGRAM;  Surgeon: Kathleene Hazelhristopher D McAlhany, MD;  [facility];  Service: Cardiovascular;  Laterality: N/A;  . LEG SURGERY     R low  . ORIF HIP FRACTURE    . TONSILLECTOMY      OB History    No data available       Home Medications    Prior to Admission medications  Medication Sig Start Date End Date Taking? Authorizing Provider  aspirin 81 MG EC tablet Take 81 mg by mouth daily.     Yes Historical Provider, MD  calcium carbonate (OS-CAL) 600 MG TABS tablet Take 600 mg by mouth every evening.    Yes Historical Provider, MD  levofloxacin (LEVAQUIN) 500 MG tablet Take 500 mg by mouth daily. For seven days 03/09/16 03/15/16 Yes Historical Provider, MD  loratadine (CLARITIN) 10 MG tablet Take 10 mg by mouth daily as needed.    Yes Historical Provider, MD  losartan (COZAAR) 50 MG tablet Take 1 tablet (50 mg total) by mouth daily. 11/17/12  Yes Dayna N Dunn, PA-C  metoprolol succinate (TOPROL-XL)  25 MG 24 hr tablet Take 1 tablet (25 mg total) by mouth daily. 07/23/15  Yes Peter M Sw[place], MD  nitroGLYCERIN (NITROSTAT) 0.4 MG SL tablet Place 1 tablet (0.4 mg total) under the tongue every 5 (five) minutes as needed. For chest pain 08/22/15  Yes Peter M [place], MD  pantoprazole (PROTONIX) 40 MG tablet TAKE 1 TABLET DAILY 02/10/16  Yes Peter M [place], MD  rosuvastatin (CRESTOR) 20 MG tablet Take 1 tablet (20 mg total) by mouth at bedtime. 01/27/16  Yes Peter M [place], MD  Zoledronic Acid (RECLAST IV) Inject into the vein. Once Yearly   Yes Historical Provider, MD  famotidine (PEPCID) 20 MG tablet One at bedtime Patient not taking: Reported on 03/10/2016 08/07/15   Nyoka Cowden, MD  traMADol (ULTRAM) 50 MG tablet Take 50 mg by mouth every 6 (six) hours as needed.    Historical Provider, MD    Family History Family History  Problem Relation Age of Onset  . Rheum arthritis Sister     Social History Social History  Substance Use Topics  . Smoking status: Former Smoker    Packs/day: 2.00    Years: 20.00    Types: Cigarettes    Quit date: 04/06/1984  . Smokeless tobacco: Never Used  . Alcohol use 0.0 oz/week     Comment: No history of alcohol abuse     Allergies   Codeine; Imdur [isosorbide dinitrate]; and Sulfonamide derivatives   Review of Systems Review of Systems  Constitutional: Negative for fever.  HENT: Positive for congestion.   Respiratory: Positive for cough and shortness of breath.   Cardiovascular: Negative for chest pain.  Gastrointestinal: Negative for abdominal pain and vomiting.  Musculoskeletal: Positive for neck pain.  Neurological: Positive for headaches.  All other systems reviewed and are negative.    Physical Exam Updated Vital Signs BP 147/82   Pulse 74   Temp 99.3 F (37.4 C) (Rectal)   Resp 18   SpO2 90%   Physical Exam  Constitutional: She is oriented to person, place, and time. She appears well-developed and well-nourished. No distress.    Coughing  HENT:  Head: Normocephalic and atraumatic.  Right Ear: External ear normal.  Left Ear: External ear normal.  Nose: Nose normal.  Eyes: EOM are normal. Pupils are equal, round, and reactive to light. Right eye exhibits no discharge. Left eye exhibits no discharge.  Neck: Normal range of motion. Neck supple.  Cardiovascular: Normal rate, regular rhythm and normal heart sounds.   Pulmonary/Chest: Effort normal. She has decreased breath sounds in the right lower field and the left lower field.  Abdominal: Soft. There is no tenderness.  Neurological: She is alert and oriented to person, place, and time.  CN 3-12 grossly intact. 5/5 strength in all 4 extremities but  LUE seems a little weaker than right. Grossly normal sensation.   Skin: Skin is warm and dry. She is not diaphoretic.  Nursing note and vitals reviewed.    ED Treatments / Results  Labs (all labs ordered are listed, but only abnormal results are displayed) Labs Reviewed  COMPREHENSIVE METABOLIC PANEL - Abnormal; Notable for the following:       Result Value   Sodium 133 (*)    Chloride 98 (*)    Glucose, Bld 119 (*)    Calcium 8.7 (*)    Albumin 3.4 (*)    Anion gap 3 (*)    All other components within normal limits  CBC WITH DIFFERENTIAL/PLATELET - Abnormal; Notable for the following:    RBC 3.66 (*)    All other components within normal limits  I-STAT CG4 LACTIC ACID, ED    EKG  EKG Interpretation  Date/Time:  Tuesday March 10 2016 11:13:14 EST Ventricular Rate:  69 PR Interval:    QRS Duration: 100 QT Interval:  410 QTC Calculation: 440 R Axis:   -22 Text Interpretation:  Sinus rhythm Borderline left axis deviation Anteroseptal infarct, age indeterminate no significant change since 2016 Confirmed by Reace Breshears MD, Rhyan Radler [phone]) on 03/10/2016 11:59:05 AM       Radiology Dg Chest 2 View  Result Date: 03/10/2016 CLINICAL DATA:  Cough EXAM: CHEST  2 VIEW COMPARISON:  03/21/2015 FINDINGS:  Pulmonary hyperinflation with COPD. Negative for pneumonia. Mild cardiac enlargement without heart failure. Coronary artery calcification. Negative for pleural effusion. No mass or adenopathy Chronic fracture with cement vertebral augmentation approximately T12. Chronic compression fracture approximately T8 unchanged. IMPRESSION: COPD without acute cardiopulmonary abnormality. Electronically Signed   By: Marlan Palau M.D.   On: 03/10/2016 11:54   Ct Head Wo Contrast  Result Date: 03/10/2016 CLINICAL DATA:  Shaking uncontrollably since this morning. EXAM: CT HEAD WITHOUT CONTRAST TECHNIQUE: Contiguous axial images were obtained from the base of the skull through the vertex without intravenous contrast. COMPARISON:  None. FINDINGS: Brain: No evidence of acute infarction, hemorrhage, hydrocephalus, extra-axial collection or mass lesion/mass effect. There is chronic diffuse atrophy. Chronic bilateral periventricular white matter small vessel ischemic changes noted. Vascular: No hyperdense vessel or unexpected calcification. Skull: Normal. Negative for fracture or focal lesion. Sinuses/Orbits: No acute finding. Other: None. IMPRESSION: No focal acute intracranial abnormality identified. Chronic diffuse atrophy. Chronic bilateral periventricular white matter small vessel ischemic change. Electronically Signed   By: Sherian Rein M.D.   On: 03/10/2016 12:22    Procedures Procedures (including critical care time)  Medications Ordered in ED Medications  sodium chloride 0.9 % bolus 1,000 mL (0 mLs Intravenous Stopped 03/10/16 1420)     Initial Impression / Assessment and Plan / ED Course  I have reviewed the triage vital signs and the nursing notes.  Pertinent labs & imaging results that were available during my care of the patient were reviewed by me and considered in my medical decision making (see chart for details).  Clinical Course as of Mar 10 1840  Tue Mar 10, 2016  1122 Unclear if this was a  seizure, unwitnessed. No meningismsus. Will start with labs, head CT given focal shaking and headache, fluids, CXR.  [SG]  1330 Dr. Amada Jupiter recommends MRI. If negative, d/c home with outpatient neuro follow up. Given unclear history, no driving/seizure precautions but no anti-epileptics at this time  [SG]  1333 On re-exam, strength seems equal. Likely mild weakness was either subjective or secondary to the shaking episode  [  SG]    Clinical Course User Index [SG] Pricilla LovelessScott Chanika Byland, MD    MRI still pending. I have discussed seizure precautions with patient. Dr. Corlis LeakMacKuen to follow up on MRI results. If negative, f/u with neuro and PCP. No dyspnea currently. Continue antibiotics as outpatient. My suspicion for infectious CNS process is quite low, no LP indicated at this time  Final Clinical Impressions(s) / ED Diagnoses   Final diagnoses:  None    New Prescriptions New Prescriptions   No medications on file     Pricilla LovelessScott Charlie Seda, MD 03/10/16 1840    Pricilla LovelessScott Inessa Wardrop, MD 03/10/16 [phone]

## 2016-03-10 NOTE — ED Notes (Signed)
Patient transported to MRI 

## 2016-03-10 NOTE — ED Notes (Signed)
Patient transported to CT 

## 2016-03-10 NOTE — ED Notes (Signed)
Patient transported to X-ray 

## 2016-03-16 ENCOUNTER — Observation Stay (HOSPITAL_COMMUNITY): Payer: Medicare Other

## 2016-03-16 ENCOUNTER — Observation Stay (HOSPITAL_COMMUNITY)
Admission: EM | Admit: 2016-03-16 | Discharge: 2016-03-18 | Disposition: A | Discharge status: Home / Self Care | Payer: Medicare Other | Attending: Internal Medicine | Admitting: Internal Medicine

## 2016-03-16 ENCOUNTER — Emergency Department (HOSPITAL_COMMUNITY): Payer: Medicare Other

## 2016-03-16 ENCOUNTER — Encounter (HOSPITAL_COMMUNITY): Payer: Self-pay | Admitting: Emergency Medicine

## 2016-03-16 DIAGNOSIS — M1990 Unspecified osteoarthritis, unspecified site: Secondary | ICD-10-CM | POA: Insufficient documentation

## 2016-03-16 DIAGNOSIS — G459 Transient cerebral ischemic attack, unspecified: Secondary | ICD-10-CM | POA: Diagnosis not present

## 2016-03-16 DIAGNOSIS — Z888 Allergy status to other drugs, medicaments and biological substances status: Secondary | ICD-10-CM | POA: Insufficient documentation

## 2016-03-16 DIAGNOSIS — Z882 Allergy status to sulfonamides status: Secondary | ICD-10-CM | POA: Diagnosis not present

## 2016-03-16 DIAGNOSIS — R531 Weakness: Secondary | ICD-10-CM

## 2016-03-16 DIAGNOSIS — I252 Old myocardial infarction: Secondary | ICD-10-CM | POA: Diagnosis not present

## 2016-03-16 DIAGNOSIS — Z87891 Personal history of nicotine dependence: Secondary | ICD-10-CM | POA: Diagnosis not present

## 2016-03-16 DIAGNOSIS — R2990 Unspecified symptoms and signs involving the nervous system: Secondary | ICD-10-CM | POA: Diagnosis not present

## 2016-03-16 DIAGNOSIS — M8580 Other specified disorders of bone density and structure, unspecified site: Secondary | ICD-10-CM | POA: Insufficient documentation

## 2016-03-16 DIAGNOSIS — I110 Hypertensive heart disease with heart failure: Secondary | ICD-10-CM | POA: Diagnosis not present

## 2016-03-16 DIAGNOSIS — I5032 Chronic diastolic (congestive) heart failure: Secondary | ICD-10-CM | POA: Diagnosis present

## 2016-03-16 DIAGNOSIS — E785 Hyperlipidemia, unspecified: Secondary | ICD-10-CM | POA: Diagnosis not present

## 2016-03-16 DIAGNOSIS — R7303 Prediabetes: Secondary | ICD-10-CM

## 2016-03-16 DIAGNOSIS — K219 Gastro-esophageal reflux disease without esophagitis: Secondary | ICD-10-CM | POA: Diagnosis not present

## 2016-03-16 DIAGNOSIS — I491 Atrial premature depolarization: Secondary | ICD-10-CM | POA: Insufficient documentation

## 2016-03-16 DIAGNOSIS — I639 Cerebral infarction, unspecified: Secondary | ICD-10-CM | POA: Diagnosis present

## 2016-03-16 DIAGNOSIS — Z955 Presence of coronary angioplasty implant and graft: Secondary | ICD-10-CM

## 2016-03-16 DIAGNOSIS — Z885 Allergy status to narcotic agent status: Secondary | ICD-10-CM | POA: Insufficient documentation

## 2016-03-16 DIAGNOSIS — I255 Ischemic cardiomyopathy: Secondary | ICD-10-CM | POA: Diagnosis not present

## 2016-03-16 DIAGNOSIS — J449 Chronic obstructive pulmonary disease, unspecified: Secondary | ICD-10-CM | POA: Diagnosis not present

## 2016-03-16 DIAGNOSIS — Z7982 Long term (current) use of aspirin: Secondary | ICD-10-CM | POA: Diagnosis not present

## 2016-03-16 DIAGNOSIS — I10 Essential (primary) hypertension: Secondary | ICD-10-CM | POA: Diagnosis not present

## 2016-03-16 DIAGNOSIS — I4901 Ventricular fibrillation: Secondary | ICD-10-CM | POA: Diagnosis not present

## 2016-03-16 DIAGNOSIS — Z8261 Family history of arthritis: Secondary | ICD-10-CM | POA: Insufficient documentation

## 2016-03-16 DIAGNOSIS — I2510 Atherosclerotic heart disease of native coronary artery without angina pectoris: Secondary | ICD-10-CM | POA: Diagnosis not present

## 2016-03-16 LAB — I-STAT TROPONIN, ED: Troponin i, poc: 0 ng/mL (ref 0.00–0.08)

## 2016-03-16 LAB — DIFFERENTIAL
Basophils Absolute: 0 10*3/uL (ref 0.0–0.1)
Basophils Relative: 0 %
Eosinophils Absolute: 0 10*3/uL (ref 0.0–0.7)
Eosinophils Relative: 0 %
Lymphocytes Relative: 16 %
Lymphs Abs: 1.3 10*3/uL (ref 0.7–4.0)
Monocytes Absolute: 0.5 10*3/uL (ref 0.1–1.0)
Monocytes Relative: 7 %
Neutro Abs: 6.2 10*3/uL (ref 1.7–7.7)
Neutrophils Relative %: 77 %

## 2016-03-16 LAB — COMPREHENSIVE METABOLIC PANEL
ALT: 18 U/L (ref 14–54)
AST: 24 U/L (ref 15–41)
Albumin: 3.6 g/dL (ref 3.5–5.0)
Alkaline Phosphatase: 56 U/L (ref 38–126)
Anion gap: 8 (ref 5–15)
BUN: 7 mg/dL (ref 6–20)
CO2: 28 mmol/L (ref 22–32)
Calcium: 9 mg/dL (ref 8.9–10.3)
Chloride: 98 mmol/L — ABNORMAL LOW (ref 101–111)
Creatinine, Ser: 0.78 mg/dL (ref 0.44–1.00)
GFR calc Af Amer: >60 mL/min (ref >60)
GFR calc non Af Amer: >60 mL/min (ref >60)
Glucose, Bld: 100 mg/dL — ABNORMAL HIGH (ref 65–99)
Potassium: 4.5 mmol/L (ref 3.5–5.1)
Sodium: 134 mmol/L — ABNORMAL LOW (ref 135–145)
Total Bilirubin: 0.7 mg/dL (ref 0.3–1.2)
Total Protein: 6.6 g/dL (ref 6.5–8.1)

## 2016-03-16 LAB — RAPID URINE DRUG SCREEN, HOSP PERFORMED
Amphetamines: NOT DETECTED
Amphetamines: NOT DETECTED
Barbiturates: NOT DETECTED
Barbiturates: NOT DETECTED
Benzodiazepines: NOT DETECTED
Benzodiazepines: NOT DETECTED
Cocaine: NOT DETECTED
Cocaine: NOT DETECTED
Opiates: NOT DETECTED
Opiates: NOT DETECTED
Tetrahydrocannabinol: NOT DETECTED
Tetrahydrocannabinol: NOT DETECTED

## 2016-03-16 LAB — URINALYSIS, ROUTINE W REFLEX MICROSCOPIC
Bilirubin Urine: NEGATIVE
Glucose, UA: NEGATIVE mg/dL
Hgb urine dipstick: NEGATIVE
Ketones, ur: NEGATIVE mg/dL
Nitrite: NEGATIVE
Protein, ur: NEGATIVE mg/dL
Specific Gravity, Urine: 1.012 (ref 1.005–1.030)
pH: 7 (ref 5.0–8.0)

## 2016-03-16 LAB — CBC
HCT: 37.3 % (ref 36.0–46.0)
Hemoglobin: 13 g/dL (ref 12.0–15.0)
MCH: 34.4 pg — ABNORMAL HIGH (ref 26.0–34.0)
MCHC: 34.9 g/dL (ref 30.0–36.0)
MCV: 98.7 fL (ref 78.0–100.0)
Platelets: 278 K/uL (ref 150–400)
RBC: 3.78 MIL/uL — ABNORMAL LOW (ref 3.87–5.11)
RDW: 12.3 % (ref 11.5–15.5)
WBC: 8.1 K/uL (ref 4.0–10.5)

## 2016-03-16 LAB — ETHANOL: Alcohol, Ethyl (B): <5 mg/dL

## 2016-03-16 LAB — I-STAT CHEM 8, ED
BUN: 12 mg/dL (ref 6–20)
Calcium, Ion: 1.08 mmol/L — ABNORMAL LOW (ref 1.15–1.40)
Chloride: 95 mmol/L — ABNORMAL LOW (ref 101–111)
Creatinine, Ser: 0.7 mg/dL (ref 0.44–1.00)
Glucose, Bld: 97 mg/dL (ref 65–99)
HCT: 40 % (ref 36.0–46.0)
Hemoglobin: 13.6 g/dL (ref 12.0–15.0)
Potassium: 4.5 mmol/L (ref 3.5–5.1)
Sodium: 134 mmol/L — ABNORMAL LOW (ref 135–145)
TCO2: 30 mmol/L (ref 0–100)

## 2016-03-16 LAB — PROTIME-INR
INR: 1.02
INR: 0.98
Prothrombin Time: 13.5 seconds (ref 11.4–15.2)
Prothrombin Time: 13 seconds (ref 11.4–15.2)

## 2016-03-16 LAB — APTT
aPTT: 32 seconds (ref 24–36)
aPTT: 33 seconds (ref 24–36)

## 2016-03-16 LAB — CBG MONITORING, ED: Glucose-Capillary: 99 mg/dL (ref 65–99)

## 2016-03-16 MED ORDER — HYDRALAZINE HCL 20 MG/ML IJ SOLN: 5.0000 mg | Freq: Three times a day (TID) | INTRAMUSCULAR | Status: DC | PRN

## 2016-03-16 MED ORDER — LOSARTAN POTASSIUM 50 MG PO TABS
50.0000 mg | ORAL_TABLET | Freq: Every day | ORAL | Status: DC
  Administered 2016-03-17 – 2016-03-18 (×2): 50 mg via ORAL
  Filled 2016-03-16 (×2): qty 1

## 2016-03-16 MED ORDER — TRAMADOL HCL 50 MG PO TABS: 50.0000 mg | ORAL_TABLET | Freq: Four times a day (QID) | ORAL | Status: DC | PRN

## 2016-03-16 MED ORDER — CALCIUM CARBONATE 1250 (500 CA) MG PO TABS
1250.0000 mg | ORAL_TABLET | Freq: Every evening | ORAL | Status: DC
  Administered 2016-03-17: 1250 mg via ORAL
  Filled 2016-03-16: qty 1

## 2016-03-16 MED ORDER — ASPIRIN EC 81 MG PO TBEC
81.0000 mg | DELAYED_RELEASE_TABLET | Freq: Every day | ORAL | Status: DC
  Administered 2016-03-17: 81 mg via ORAL
  Filled 2016-03-16: qty 1

## 2016-03-16 MED ORDER — ACETAMINOPHEN 650 MG RE SUPP: 650.0000 mg | RECTAL | Status: DC | PRN

## 2016-03-16 MED ORDER — ACETAMINOPHEN 325 MG PO TABS: 650.0000 mg | ORAL_TABLET | ORAL | Status: DC | PRN

## 2016-03-16 MED ORDER — ROSUVASTATIN CALCIUM 5 MG PO TABS
20.0000 mg | ORAL_TABLET | Freq: Every day | ORAL | Status: DC
  Administered 2016-03-16 – 2016-03-17 (×2): 20 mg via ORAL
  Filled 2016-03-16 (×2): qty 4

## 2016-03-16 MED ORDER — ENOXAPARIN SODIUM 40 MG/0.4ML ~~LOC~~ SOLN
40.0000 mg | SUBCUTANEOUS | Status: DC
  Administered 2016-03-16 – 2016-03-17 (×2): 40 mg via SUBCUTANEOUS
  Filled 2016-03-16 (×2): qty 0.4

## 2016-03-16 MED ORDER — METOPROLOL SUCCINATE ER 25 MG PO TB24
25.0000 mg | ORAL_TABLET | Freq: Every day | ORAL | Status: DC
  Administered 2016-03-17 – 2016-03-18 (×2): 25 mg via ORAL
  Filled 2016-03-16 (×2): qty 1

## 2016-03-16 MED ORDER — STROKE: EARLY STAGES OF RECOVERY BOOK
Freq: Once | Status: AC
  Administered 2016-03-16: 18:00:00
  Filled 2016-03-16: qty 1

## 2016-03-16 MED ORDER — SENNOSIDES-DOCUSATE SODIUM 8.6-50 MG PO TABS: 1.0000 | ORAL_TABLET | Freq: Every evening | ORAL | Status: DC | PRN

## 2016-03-16 MED ORDER — ACETAMINOPHEN 160 MG/5ML PO SOLN: 650.0000 mg | ORAL | Status: DC | PRN

## 2016-03-16 MED ORDER — VITAMIN D 1000 UNITS PO TABS
1000.0000 [IU] | ORAL_TABLET | Freq: Every day | ORAL | Status: DC
  Administered 2016-03-16 – 2016-03-18 (×3): 1000 [IU] via ORAL
  Filled 2016-03-16 (×3): qty 1

## 2016-03-16 MED ORDER — PANTOPRAZOLE SODIUM 40 MG PO TBEC
40.0000 mg | DELAYED_RELEASE_TABLET | Freq: Every day | ORAL | Status: DC
  Administered 2016-03-17 – 2016-03-18 (×2): 40 mg via ORAL
  Filled 2016-03-16 (×2): qty 1

## 2016-03-16 MED ORDER — FAMOTIDINE 20 MG PO TABS: 20.0000 mg | ORAL_TABLET | Freq: Every day | ORAL | Status: DC

## 2016-03-16 MED ORDER — GADOBENATE DIMEGLUMINE 529 MG/ML IV SOLN
20.0000 mL | Freq: Once | INTRAVENOUS | Status: AC
  Administered 2016-03-16: 16 mL via INTRAVENOUS

## 2016-03-16 NOTE — Code Documentation (Signed)
75 y.o female arrives via [facility] as [facility] Patient awoke this morning at 0730 in her normal state of health. At 0830 the pt and her husband noticed an acute onset of right sided facial droop, RUE drift and unilateral right sided numbness and tingling. Upon arrival, labs were obtained, CT scan performed and assessment done. NIHSS of 3. See EMR for NIHSS and code stroke times. Upon assessment, pt has some slight facial droop on the right side, and drift in her RUE and RLE. tPA not given due too being too mild to treat. Pt to stay in tPA window until 1300. Bedside handoff with ED RN Hope.

## 2016-03-16 NOTE — Progress Notes (Signed)
Patient admitted to room 5C04 from ED at this time. Alert and in stable condition. Call bed within reach and bed in lowest position for safety. Husband at bedside.

## 2016-03-16 NOTE — ED Provider Notes (Signed)
[facility] Provider Note   CSN: [id number] Arrival date & time: 03/16/16  1159   An emergency department physician performed an initial assessment on this suspected stroke patient at 1200.  History   Chief Complaint Chief Complaint  Patient presents with  . Code Stroke    HPI [name] is a 75 y.o. female.  HPI  Patient is 75 year old female who presents the emergency department as a code stroke.  She was in her normal state health at 7:30 and today at approximately 8:30 the patient noticed acute onset of right-sided facial droop and at upper extremity and right lower extremity weakness and numbness.  No prior history of stroke.  She does have a history of COPD, hypertension, coronary artery disease, hyperlipidemia, ischemic myopathy.  She denies headache at this time.  She is brought to emergency department as a code stroke.       Past Medical History:  Diagnosis Date  . Compression fracture    T9  . COPD (chronic obstructive pulmonary disease) (HCC)   . Coronary artery disease 08-14-09   a. 08/2009: Ant MI c/b v-fib arrest, s/p PTCA/BMS to LAD.  Marland Kitchen GERD (gastroesophageal reflux disease)   . HTN (hypertension)   . Hyperlipidemia   . Ischemic cardiomyopathy    a. EF 40-45% in 08/2009 at time of MI. b. Improved to 55-60% by June 2011.  . Osteoarthritis   . Osteopenia   . PONV (postoperative nausea and vomiting)   . Postoperative groin pseudoaneurysm (HCC)    a. 04/2010 following diagnostic cardiac cath, s/p compression.  . Rib fractures    left sided second and sixth rib   . Ventricular fibrillation (HCC)    a. 08/2009: due to anterior MI.    Patient Active Problem List   Diagnosis Date Noted  . Dyspnea 08/08/2015  . Preoperative clearance 05/09/2015  . Atypical chest pain 03/21/2015  . Acute UTI 03/21/2015  . Hydronephrosis, right 03/21/2015  . Proteinuria 03/21/2015  . HTN (hypertension) 03/21/2015  . Chronic diastolic heart failure (HCC) 03/21/2015    . 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 Unstable angina (HCC) 11/16/2012  . Palpitations 07/22/2010  . COPD GOLD I  05/30/2010  . Hyperlipidemia 08/27/2009  . AMI 08/27/2009  . CAD (coronary artery disease) 08/27/2009  . VENTRICULAR FIBRILLATION 08/14/2009  . COMPRESSION FRACTURE, LUMBAR VERTEBRAE 12/21/2008  . SPINAL STENOSIS, LUMBAR 08/31/2008  . Macrocytic anemia 08/30/2008  . ABDOMINAL MASS 07/11/2008  . UNSPECIFIED VITAMIN D DEFICIENCY 03/09/2008  . GERD 03/09/2008  . OSTEOARTHRITIS 11/21/2007  . OSTEOPENIA 11/21/2007    Past Surgical History:  Procedure Laterality Date  . APPENDECTOMY    . BACK SURGERY    . LEFT HEART CATHETERIZATION WITH CORONARY ANGIOGRAM N/A 11/16/2012   Procedure: LEFT HEART CATHETERIZATION WITH CORONARY ANGIOGRAM;  Surgeon: Kathleene Hazel, MD;  Location[facility] CATH LAB;  Service: Cardiovascular;  Laterality: N/A;  . LEG SURGERY     R low  . ORIF HIP FRACTURE    . TONSILLECTOMY      OB History    No data available       Home Medications    Prior to Admission medications   Medication Sig Start Date End Date Taking? Authorizing Provider  aspirin 81 MG EC tablet Take 81 mg by mouth daily.     Yes Historical Provider, MD  azithromycin (ZITHROMAX) 250 MG tablet Take 250 mg by mouth daily. Started on 03-13-16 for 4 day therapy.   Yes Historical Provider,  MD  calcium carbonate (OS-CAL) 600 MG TABS tablet Take 600 mg by mouth every evening.    Yes Historical Provider, MD  Cholecalciferol (VITAMIN D3) 1000 units CAPS Take 1,000 Units by mouth daily.   Yes Historical Provider, MD  famotidine (PEPCID) 20 MG tablet One at bedtime 08/07/15  Yes Nyoka Cowden, MD  loratadine (CLARITIN) 10 MG tablet Take 10 mg by mouth daily as needed.    Yes Historical Provider, MD  losartan (COZAAR) 50 MG tablet Take 1 tablet (50 mg total) by mouth daily. 11/17/12  Yes Dayna N Dunn, PA-C  metoprolol succinate (TOPROL-XL) 25 MG 24 hr tablet Take 1 tablet  (25 mg total) by mouth daily. 07/23/15  Yes Peter M [place], MD  pantoprazole (PROTONIX) 40 MG tablet TAKE 1 TABLET DAILY 02/10/16  Yes Peter M [place], MD  rosuvastatin (CRESTOR) 20 MG tablet Take 1 tablet (20 mg total) by mouth at bedtime. 01/27/16  Yes Peter M [place], MD  traMADol (ULTRAM) 50 MG tablet Take 50 mg by mouth every 6 (six) hours as needed.   Yes Historical Provider, MD  Zoledronic Acid (RECLAST IV) Inject into the vein. Once Yearly   Yes Historical Provider, MD  nitroGLYCERIN (NITROSTAT) 0.4 MG SL tablet Place 1 tablet (0.4 mg total) under the tongue every 5 (five) minutes as needed. For chest pain 08/22/15   Peter M [place], MD    Family History Family History  Problem Relation Age of Onset  . Rheum arthritis Sister     Social History Social History  Substance Use Topics  . Smoking status: Former Smoker    Packs/day: 2.00    Years: 20.00    Types: Cigarettes    Quit date: 04/06/1984  . Smokeless tobacco: Never Used  . Alcohol use 0.0 oz/week     Comment: No history of alcohol abuse     Allergies   Codeine; Imdur [isosorbide dinitrate]; and Sulfonamide derivatives   Review of Systems Review of Systems  All other systems reviewed and are negative.    Physical Exam Updated Vital Signs BP 158/83   Pulse 68   Temp 98.7 F (37.1 C) (Oral)   Resp 17   Ht 5\' 5"  (1.651 m)   Wt 163 lb (73.9 kg)   SpO2 96%   BMI 27.12 kg/m   Physical Exam  Constitutional: She appears well-developed and well-nourished. No distress.  HENT:  Head: Normocephalic and atraumatic.  Eyes: EOM are normal.  Neck: Normal range of motion.  Cardiovascular: Normal rate, regular rhythm and normal heart sounds.   Pulmonary/Chest: Effort normal and breath sounds normal.  Abdominal: Soft. She exhibits no distension. There is no tenderness.  Musculoskeletal: Normal range of motion.  Neurological: She is alert.  Mild right upper and right lower extremity weakness as compared to normal strength  in the left upper and left lower extremities.  Mild dysarthria.  Skin: Skin is warm and dry.  Psychiatric: She has a normal mood and affect. Judgment normal.  Nursing note and vitals reviewed.    ED Treatments / Results  Labs (all labs ordered are listed, but only abnormal results are displayed) Labs Reviewed  CBC - Abnormal; Notable for the following:       Result Value   RBC 3.78 (*)    MCH 34.4 (*)    All other components within normal limits  COMPREHENSIVE METABOLIC PANEL - Abnormal; Notable for the following:    Sodium 134 (*)    Chloride 98 (*)  Glucose, Bld 100 (*)    All other components within normal limits  URINALYSIS, ROUTINE W REFLEX MICROSCOPIC - Abnormal; Notable for the following:    APPearance HAZY (*)    Leukocytes, UA LARGE (*)    Bacteria, UA RARE (*)    Squamous Epithelial / LPF 0-5 (*)    Non Squamous Epithelial 0-5 (*)    All other components within normal limits  I-STAT CHEM 8, ED - Abnormal; Notable for the following:    Sodium 134 (*)    Chloride 95 (*)    Calcium, Ion 1.08 (*)    All other components within normal limits  ETHANOL  PROTIME-INR  APTT  DIFFERENTIAL  RAPID URINE DRUG SCREEN, HOSP PERFORMED  I-STAT TROPOININ, ED  I-STAT TROPOININ, ED  CBG MONITORING, ED  I-STAT CHEM 8, ED    EKG  EKG Interpretation  Date/Time:  Monday March 16 2016 12:30:56 EST Ventricular Rate:  60 PR Interval:    QRS Duration: 94 QT Interval:  394 QTC Calculation: 394 R Axis:   8 Text Interpretation:  Sinus rhythm Atrial premature complex Anteroseptal infarct, age indeterminate No significant change was found Confirmed by Whitley Strycharz  MD, Caryn BeeKEVIN ([id number]) on 03/16/2016 2:40:02 PM       Radiology Ct Head Code Stroke W/o Cm  Result Date: 03/16/2016 CLINICAL DATA:  Code stroke. 75 year old female right side facial droop, right extremity weakness. Last seen normal 0730 hours. Initial encounter. EXAM: CT HEAD WITHOUT CONTRAST TECHNIQUE: Contiguous axial  images were obtained from the base of the skull through the vertex without intravenous contrast. COMPARISON:  Brain MRI and head CT 03/10/2016 FINDINGS: Brain: No midline shift, mass effect, or evidence of intracranial mass lesion. No ventriculomegaly. No acute intracranial hemorrhage identified. Patchy bilateral cerebral white matter hypodensity is stable. Patchy hypodensity in the pons appears stable. No cortically based acute infarct identified. Incidental dystrophic basal ganglia calcifications. Vascular: Calcified atherosclerosis at the skull base. No suspicious intracranial vascular hyperdensity. Skull: No acute osseous abnormality identified. Sinuses/Orbits: Stable and well pneumatized aside from right mastoid effusion. Other: No acute orbit or scalp soft tissue finding. ASPECTS [facility]([place] Stroke Program Early CT Score) - Ganglionic level infarction (caudate, lentiform nuclei, internal capsule, insula, M1-M3 cortex): 7 - Supraganglionic infarction (M4-M6 cortex): 3 Total score (0-10 with 10 being normal): 10 IMPRESSION: 1. No evidence of acute cortically based infarct. No acute intracranial hemorrhage. 2. ASPECTS is 10. 3. Cerebral white matter and pontine hypodensity appears stable compatible with chronic small vessel disease. 4. The above was relayed via text pager to Dr. Ritta SlotMcNeill Kirkpatrick on 03/16/2016 at 12:22 . Electronically Signed   By: Odessa FlemingH  Hall M.D.   On: 03/16/2016 12:23    Procedures Procedures (including critical care time)  Medications Ordered in ED Medications - No data to display   Initial Impression / Assessment and Plan / ED Course  I have reviewed the triage vital signs and the nursing notes.  Pertinent labs & imaging results that were available during my care of the patient were reviewed by me and considered in my medical decision making (see chart for details).  Clinical Course     Patient with what looks like acute stroke.  Patient seen and evaluated by neurology on  arrival.  The patient was not not to be a candidate for TPA secondary to low level NIH stroke scale.  Please see neurology consultation note for complete details.     Final Clinical Impressions(s) / ED Diagnoses   Final diagnoses:  None    New Prescriptions New Prescriptions   No medications on file     Azalia BilisKevin Phaedra Colgate, MD 03/16/16 1441

## 2016-03-16 NOTE — ED Notes (Signed)
Patient undressed, in gown, on monitor, continuous pulse oximetry and blood pressure cuff 

## 2016-03-16 NOTE — ED Notes (Signed)
Pt's husband at bedside. Q2 hour neuro checks now at this time. No changes from baseline exam.

## 2016-03-16 NOTE — Consult Note (Signed)
Neurology Consultation Reason for Consult: Right-sided weakness Referring Physician: Evelena PeatMerrel, D  CC: Right-sided weakness  History is obtained from: Patient  HPI: [name] is a 75 y.o. female with a history of arrhythmia who presents with right-sided weakness started around 8:30 AM. She states that is been persistent since this time. She called 911 and was brought into the emergency department and arrived shortly before the end of the 4-1/2 hour IV tPA window, however after evaluation she did not have any clear disabling deficit with only mild weakness, preserved ability to walk, intact fine motor ability and therefore TPA was not pursued.   LKW: 8:30 AM tpa given?: no, mild symptoms   ROS: A 14 point ROS was performed and is negative except as noted in the HPI.   Past Medical History:  Diagnosis Date  . Compression fracture    T9  . COPD (chronic obstructive pulmonary disease) (HCC)   . Coronary artery disease 08-14-09   a. 08/2009: Ant MI c/b v-fib arrest, s/p PTCA/BMS to LAD.  Marland Kitchen. GERD (gastroesophageal reflux disease)   . HTN (hypertension)   . Hyperlipidemia   . Ischemic cardiomyopathy    a. EF 40-45% in 08/2009 at time of MI. b. Improved to 55-60% by June 2011.  . Osteoarthritis   . Osteopenia   . PONV (postoperative nausea and vomiting)   . Postoperative groin pseudoaneurysm (HCC)    a. 04/2010 following diagnostic cardiac cath, s/p compression.  . Rib fractures    left sided second and sixth rib   . Ventricular fibrillation (HCC)    a. 08/2009: due to anterior MI.     Family History  Problem Relation Age of Onset  . Rheum arthritis Sister      Social History:  reports that she quit smoking about 31 years ago. Her smoking use included Cigarettes. She has a 40.00 pack-year smoking history. She has never used smokeless tobacco. She reports that she drinks alcohol. She reports that she does not use drugs.   Exam: Current vital signs: BP (!) 143/80 (BP Location:  Right Arm)   Pulse 69   Temp 97.9 F (36.6 C) (Oral)   Resp 18   Ht 5\' 5"  (1.651 m)   Wt 73.9 kg (163 lb)   SpO2 95%   BMI 27.12 kg/m  Vital signs in last 24 hours: Temp:  [97.5 F (36.4 C)-98.7 F (37.1 C)] 97.9 F (36.6 C) (12/11 1959) Pulse Rate:  [56-70] 69 (12/11 1959) Resp:  [13-20] 18 (12/11 1959) BP: (137-176)/(76-86) 143/80 (12/11 1959) SpO2:  [91 %-97 %] 95 % (12/11 1959) Weight:  [73.9 kg (163 lb)] 73.9 kg (163 lb) (12/11 1226)   Physical Exam  Constitutional: Appears well-developed and well-nourished.  Psych: Affect appropriate to situation Eyes: No scleral injection HENT: No OP obstrucion Head: Normocephalic.  Cardiovascular: Normal rate and regular rhythm.  Respiratory: Effort normal and breath sounds normal to anterior ascultation GI: Soft.  No distension. There is no tenderness.  Skin: WDI  Neuro: Mental Status: Patient is awake, alert, oriented to person, place, month, year, and situation. Patient is able to give a clear and coherent history. No signs of aphasia or neglect Cranial Nerves: II: Visual Fields are full. Pupils are equal, round, and reactive to light.   III,IV, VI: EOMI without ptosis or diploplia.  V: Facial sensation is symmetric to temperature VII: Facial movement is very mildly decreased in the right VIII: hearing is intact to voice X: Uvula elevates symmetrically XI: Shoulder shrug  is symmetric. XII: tongue is midline without atrophy or fasciculations.  Motor: Tone is normal. Bulk is normal. 5/5 strength was present in  On the left side, she has very mild 4+/5 right arm and leg weakness Sensory: Sensation is symmetric to light touch and pin in the arms and legs. Cerebellar: FNF and HKS are intact bilaterally  I have reviewed labs in epic and the results pertinent to this consultation are: Chem 8-unremarkable other than mild hyponatremia at 134  I have reviewed the images obtained: CT head-negative  Impression: 75 year old  female with acute onset right hemiparesis most consistent with a small ischemic infarct. This time she is not a candidate for TPA due to her symptoms being mild and nondisabling.  Recommendations: 1. HgbA1c, fasting lipid panel 2. MRI, MRA  of the brain without contrast 3. Frequent neuro checks 4. Echocardiogram 5. MRA neck w/wo contrast 6. Prophylactic therapy-Antiplatelet med: Aspirin - dose 325mg  PO or 300mg  PR 7. Risk factor modification 8. Telemetry monitoring 9. PT consult, OT consult, Speech consult 10. please page stroke NP  Or  PA  Or MD  from 8am -4 pm starting 12/12 as this patient will be followed by the stroke team at this point.   You can look them up on [email or web address]      Ritta SlotMcNeill Nimra Puccinelli, MD [facility] [phone]  If 7pm- 7am, please page neurology on call as listed in AMION.

## 2016-03-16 NOTE — ED Triage Notes (Signed)
Pt from home. Woke up 0730 feeling normal. At 0830 pt's right side went numb/tingling. Pt had right sided facial droop and right arm drift per EMS. Pt also complaining of slight headache. CBG 109, HR 60 SR, BP 150/100.

## 2016-03-16 NOTE — H&P (Signed)
History and Physical    Daniel Johndrow ZOX:[id number] DOB: 07-Dec-1940 DOA: 03/16/2016   PCP: Enrique Sack, MD   Patient coming from:  Home  Chief Complaint: Right sided weakness   HPI: [name] is a 75 y.o. female  with multiple medical issues listed below, presenting to the emergency department with acute onset of right sided facial droop and upper extremity and right lower extremity weakness and numbness. Her last known normal was at 730 in the morning. The patient does not have a prior history of stroke. She does have a history of COPD, hypertension, CAD, hyperlipidemia, ischemic myopathy. The patient was brought here as a code stroke. She denies any fever or chills at this time, recovering from bronchitis 10 days prior , finishing Z pack  She denies any chest pain, but she did have palpitations with some dizziness this morning. She denies any nausea or vomiting. No syncope or pre-syncope. She is not on anticoagulants. She was on Plavix was on 2011 to 2012 after vfib arrest  She denies any leg swelling. No dysphagia reported, No confusion headaches, double vision, blurred vision headaches  or seizures . No hormonal replacement. No recent long distance trips. No tobacco or ETOH or recreational drugs    ED Course:  BP (!) 172/76   Pulse (!) 56   Temp 98.2 F (36.8 C) (Oral)   Resp 20   Ht 5\' 5"  (1.651 m)   Wt 73.9 kg (163 lb)   SpO2 97%   BMI 27.12 kg/m    sodium 134 potassium 4.5 creatinine 0.7 glucose 97  troponin 0 hemoglobin 13.6 white count 8.1 platelets 278  EKG Sinus rhythm Atrial premature complex without changes from prior  Neurology consulted patient, not a tPA candidate due to low NIH scale    Review of Systems: As per HPI otherwise 10 point review of systems negative.   Past Medical History:  Diagnosis Date  . Compression fracture    T9  . COPD (chronic obstructive pulmonary disease) (HCC)   . Coronary artery disease 08-14-09   a. 08/2009: Ant MI c/b v-fib arrest,  s/p PTCA/BMS to LAD.  Marland Kitchen GERD (gastroesophageal reflux disease)   . HTN (hypertension)   . Hyperlipidemia   . Ischemic cardiomyopathy    a. EF 40-45% in 08/2009 at time of MI. b. Improved to 55-60% by June 2011.  . Osteoarthritis   . Osteopenia   . PONV (postoperative nausea and vomiting)   . Postoperative groin pseudoaneurysm (HCC)    a. 04/2010 following diagnostic cardiac cath, s/p compression.  . Rib fractures    left sided second and sixth rib   . Ventricular fibrillation (HCC)    a. 08/2009: due to anterior MI.    Past Surgical History:  Procedure Laterality Date  . APPENDECTOMY    . BACK SURGERY    . LEFT HEART CATHETERIZATION WITH CORONARY ANGIOGRAM N/A 11/16/2012   Procedure: LEFT HEART CATHETERIZATION WITH CORONARY ANGIOGRAM;  Surgeon: Kathleene Hazel, MD;  Location: [facility] CATH LAB;  Service: Cardiovascular;  Laterality: N/A;  . LEG SURGERY     R low  . ORIF HIP FRACTURE    . TONSILLECTOMY      Social History Social History   Social History  . Marital status: Married    Spouse name: N/A  . Number of children: N/A  . Years of education: N/A   Occupational History  . retired Clinical research associate    Social History Main Topics  . Smoking status: Former  Smoker    Packs/day: 2.00    Years: 20.00    Types: Cigarettes    Quit date: 04/06/1984  . Smokeless tobacco: Never Used  . Alcohol use 0.0 oz/week     Comment: No history of alcohol abuse  . Drug use: No  . Sexual activity: No   Other Topics Concern  . Not on file   Social History Narrative  . No narrative on file     Allergies  Allergen Reactions  . Codeine Nausea And Vomiting  . Imdur [Isosorbide Dinitrate] Nausea Only    Headache  . Sulfonamide Derivatives Nausea And Vomiting    Family History  Problem Relation Age of Onset  . Rheum arthritis Sister       Prior to Admission medications   Medication Sig Start Date End Date Taking? Authorizing Provider  aspirin 81 MG EC tablet Take 81 mg by mouth  daily.     Yes Historical Provider, MD  azithromycin (ZITHROMAX) 250 MG tablet Take 250 mg by mouth daily. Started on 03-13-16 for 4 day therapy.   Yes Historical Provider, MD  calcium carbonate (OS-CAL) 600 MG TABS tablet Take 600 mg by mouth every evening.    Yes Historical Provider, MD  Cholecalciferol (VITAMIN D3) 1000 units CAPS Take 1,000 Units by mouth daily.   Yes Historical Provider, MD  famotidine (PEPCID) 20 MG tablet One at bedtime 08/07/15  Yes Nyoka Cowden, MD  loratadine (CLARITIN) 10 MG tablet Take 10 mg by mouth daily as needed.    Yes Historical Provider, MD  losartan (COZAAR) 50 MG tablet Take 1 tablet (50 mg total) by mouth daily. 11/17/12  Yes Dayna N Dunn, PA-C  metoprolol succinate (TOPROL-XL) 25 MG 24 hr tablet Take 1 tablet (25 mg total) by mouth daily. 07/23/15  Yes Peter M [place], MD  pantoprazole (PROTONIX) 40 MG tablet TAKE 1 TABLET DAILY 02/10/16  Yes Peter M [place], MD  rosuvastatin (CRESTOR) 20 MG tablet Take 1 tablet (20 mg total) by mouth at bedtime. 01/27/16  Yes Peter M [place], MD  traMADol (ULTRAM) 50 MG tablet Take 50 mg by mouth every 6 (six) hours as needed.   Yes Historical Provider, MD  Zoledronic Acid (RECLAST IV) Inject into the vein. Once Yearly   Yes Historical Provider, MD  nitroGLYCERIN (NITROSTAT) 0.4 MG SL tablet Place 1 tablet (0.4 mg total) under the tongue every 5 (five) minutes as needed. For chest pain 08/22/15   Peter M [place], MD    Physical Exam:    Vitals:   03/16/16 1430 03/16/16 1445 03/16/16 1515 03/16/16 1600  BP: 137/86 162/86 176/85 (!) 172/76  Pulse: 61 (!) 58 (!) 59 (!) 56  Resp: 17 17 18 20   Temp:    98.2 F (36.8 C)  TempSrc:    Oral  SpO2: 91% 96% 96% 97%  Weight:      Height:           Constitutional: NAD, calm, comfortable   Vitals:   03/16/16 1430 03/16/16 1445 03/16/16 1515 03/16/16 1600  BP: 137/86 162/86 176/85 (!) 172/76  Pulse: 61 (!) 58 (!) 59 (!) 56  Resp: 17 17 18 20   Temp:    98.2 F (36.8 C)    TempSrc:    Oral  SpO2: 91% 96% 96% 97%  Weight:      Height:       Eyes: PERRL, lids and conjunctivae normal ENMT: Mucous membranes are moist. Posterior pharynx clear of any exudate  or lesions.Normal dentition.  Neck: normal, supple, no masses, no thyromegaly Respiratory: clear to auscultation bilaterally, no wheezing, no crackles. Normal respiratory effort. No accessory muscle use.  Cardiovascular: Regular rate and rhythm, no murmurs / rubs / gallops. No extremity edema. 2+ pedal pulses. No carotid bruits.  Abdomen: no tenderness, no masses palpated. No hepatosplenomegaly. Bowel sounds positive.  Musculoskeletal: no clubbing / cyanosis. No joint deformity upper and lower extremities. Good ROM, no contractures. Normal muscle tone.  Skin: no rashes, lesions, ulcers.  Neurologic: Right side grip 4/5. 4/5 R flexion, Mild dysarthria  no other abnormalities seen  Psychiatric: Normal judgment and insight. Alert and oriented x 3. Normal mood.     Labs on Admission: I have personally reviewed following labs and imaging studies  CBC:  Recent Labs Lab 03/10/16 1153 03/16/16 1219 03/16/16 1228  WBC 5.4 8.1  --   NEUTROABS 3.9 6.2  --   HGB 12.3 13.0 13.6  HCT 36.3 37.3 40.0  MCV 99.2 98.7  --   PLT 226 278  --     Basic Metabolic Panel:  Recent Labs Lab 03/10/16 1153 03/16/16 1219 03/16/16 1228  NA 133* 134* 134*  K 4.1 4.5 4.5  CL 98* 98* 95*  CO2 32 28  --   GLUCOSE 119* 100* 97  BUN 8 7 12   CREATININE 0.74 0.78 0.70  CALCIUM 8.7* 9.0  --     GFR: Estimated Creatinine Clearance: 60.3 mL/min (by C-G formula based on SCr of 0.7 mg/dL).  Liver Function Tests:  Recent Labs Lab 03/10/16 1153 03/16/16 1219  AST 24 24  ALT 18 18  ALKPHOS 59 56  BILITOT 0.6 0.7  PROT 6.6 6.6  ALBUMIN 3.4* 3.6   No results for input(s): LIPASE, AMYLASE in the last 168 hours. No results for input(s): AMMONIA in the last 168 hours.  Coagulation Profile:  Recent Labs Lab  03/16/16 1219  INR 1.02    Cardiac Enzymes: No results for input(s): CKTOTAL, CKMB, CKMBINDEX, TROPONINI in the last 168 hours.  BNP (last 3 results) No results for input(s): PROBNP in the last 8760 hours.  HbA1C: No results for input(s): HGBA1C in the last 72 hours.  CBG:  Recent Labs Lab 03/16/16 1221  GLUCAP 99    Lipid Profile: No results for input(s): CHOL, HDL, LDLCALC, TRIG, CHOLHDL, LDLDIRECT in the last 72 hours.  Thyroid Function Tests: No results for input(s): TSH, T4TOTAL, FREET4, T3FREE, THYROIDAB in the last 72 hours.  Anemia Panel: No results for input(s): VITAMINB12, FOLATE, FERRITIN, TIBC, IRON, RETICCTPCT in the last 72 hours.  Urine analysis:    Component Value Date/Time   COLORURINE YELLOW 03/16/2016 1307   APPEARANCEUR HAZY (A) 03/16/2016 1307   LABSPEC 1.012 03/16/2016 1307   PHURINE 7.0 03/16/2016 1307   GLUCOSEU NEGATIVE 03/16/2016 1307   HGBUR NEGATIVE 03/16/2016 1307   HGBUR negative 05/03/2008 1357   BILIRUBINUR NEGATIVE 03/16/2016 1307   KETONESUR NEGATIVE 03/16/2016 1307   PROTEINUR NEGATIVE 03/16/2016 1307   UROBILINOGEN 0.2 08/08/2013 0721   NITRITE NEGATIVE 03/16/2016 1307   LEUKOCYTESUR LARGE (A) 03/16/2016 1307    Sepsis Labs: @LABRCNTIP (procalcitonin:4,lacticidven:4) )No results found for this or any previous visit (from the past 240 hour(s)).   Radiological Exams on Admission: Ct Head Code Stroke W/o Cm  Result Date: 03/16/2016 CLINICAL DATA:  Code stroke. 75 year old female right side facial droop, right extremity weakness. Last seen normal 0730 hours. Initial encounter. EXAM: CT HEAD WITHOUT CONTRAST TECHNIQUE: Contiguous axial images were obtained  from the base of the skull through the vertex without intravenous contrast. COMPARISON:  Brain MRI and head CT 03/10/2016 FINDINGS: Brain: No midline shift, mass effect, or evidence of intracranial mass lesion. No ventriculomegaly. No acute intracranial hemorrhage identified.  Patchy bilateral cerebral white matter hypodensity is stable. Patchy hypodensity in the pons appears stable. No cortically based acute infarct identified. Incidental dystrophic basal ganglia calcifications. Vascular: Calcified atherosclerosis at the skull base. No suspicious intracranial vascular hyperdensity. Skull: No acute osseous abnormality identified. Sinuses/Orbits: Stable and well pneumatized aside from right mastoid effusion. Other: No acute orbit or scalp soft tissue finding. ASPECTS [facility]([place] Stroke Program Early CT Score) - Ganglionic level infarction (caudate, lentiform nuclei, internal capsule, insula, M1-M3 cortex): 7 - Supraganglionic infarction (M4-M6 cortex): 3 Total score (0-10 with 10 being normal): 10 IMPRESSION: 1. No evidence of acute cortically based infarct. No acute intracranial hemorrhage. 2. ASPECTS is 10. 3. Cerebral white matter and pontine hypodensity appears stable compatible with chronic small vessel disease. 4. The above was relayed via text pager to Dr. Ritta SlotMcNeill Kirkpatrick on 03/16/2016 at 12:22 . Electronically Signed   By: Odessa FlemingH  Hall M.D.   On: 03/16/2016 12:23    EKG: Independently reviewed.  Assessment/Plan Active Problems:   Stroke [facility](HCC)   Hyperlipidemia   CAD (coronary artery disease)   Ventricular fibrillation (HCC)   GERD   HTN (hypertension)   Chronic diastolic heart failure (HCC)    Acute-sided weakness Acute CVA Not a TPA candidate due tolow NIH  as  last known normal was at 7:30 am.  CT head shows no acute infarct, or intracranial hemorrhage.    Admit to Tele obs  Stroke order set  MRI/MRA brain   MRA neck Allow permissive HTN Echo  PT/OT/SLP  lipid panel A1C  Aspirin  Neuro  following, awaiting furthe recommendations. Appreciate their input    Hypertension BP   172/76   Pulse  56  Controlled Continue home anti-hypertensive medications in am, as he is on permissive hypertension  Add Hydralazine Q6 hours as needed for BP 210/210    Hyperlipidemia Continue home statins  COPD without exacerbation Osats normal   WBC  8.1  Continue O2 prn, supportive therapy as needed  GERD, no acute symptoms: Continue PPI  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  Occasional palps bur no acute symptoms at this time. EKG without ACS  Will monitor closely     Chronic Diastolic CHF: No acute decompensation. Last 2 D echo  2013 normal EF 50-55 Gr 1 DD  . Weight 73.9 kg  Continue meds except antihypertensives as patient is on permissive HTN  Obtain daily weights Monitor intake and output New 2 D echo pending     History of R hydro, current UA with large leukocytes, negative Nitrites . WBC normal Afebrile, VSS  Culture urine    DVT prophylaxis: Lovenox  Code Status:   Full    Family Communication:  Discussed with patient Disposition Plan: Expect patient to be discharged to home after condition improves Consults called:    Neuro  Admission status:Tele  Obs     Northern [facility] HospitalWERTMAN,Livie Vanderhoof E, PA-C [facility]   03/16/2016, 4:53 PM

## 2016-03-16 NOTE — ED Notes (Signed)
POCT CBG resulted 99; Hope, RN aware

## 2016-03-16 NOTE — Evaluation (Signed)
Physical Therapy Evaluation Patient Details Name: [name] MRN: [id number] DOB: 06/13/1940 Today's Date: 03/16/2016   History of Present Illness  Patient is a 75 yo female admitted 03/16/16 with Rt-sided weakness and Rt facial droop.  NIHSS = 3.  PMH:  COPD, HTN, CAD, MI, ICM, CHF, HLD, OA, T9 comp fx  Clinical Impression  Patient presents with problems listed below.  Will benefit from acute PT to maximize functional independence prior to discharge home.  Patient with decreased DF Rt LE, impacting balance and mobility.  Patient may benefit from OP PT follow-up at discharge for gait/balance training.    Follow Up Recommendations Outpatient PT;Supervision - Intermittent    Equipment Recommendations  None recommended by PT    Recommendations for Other Services       Precautions / Restrictions Precautions Precautions: Fall Restrictions Weight Bearing Restrictions: No      Mobility  Bed Mobility Overal bed mobility: Modified Independent             General bed mobility comments: Increased time  Transfers Overall transfer level: Needs assistance Equipment used: Straight cane Transfers: Sit to/from Stand Sit to Stand: Supervision         General transfer comment: Assist for safety only.  Good balance with stance.  Ambulation/Gait Ambulation/Gait assistance: Min guard Ambulation Distance (Feet): 40 Feet Assistive device: Straight cane Gait Pattern/deviations: Step-through pattern;Decreased stride length;Decreased dorsiflexion - right;Trunk flexed Gait velocity: decreased Gait velocity interpretation: Below normal speed for age/gender General Gait Details: Patient with slow, guarded gait.  Slightly unsteady.  Assist for safety/balance.  Stairs            Wheelchair Mobility    Modified Rankin (Stroke Patients Only) Modified Rankin (Stroke Patients Only) Pre-Morbid Rankin Score: No symptoms Modified Rankin: Moderately severe disability     Balance  Overall balance assessment: Needs assistance         Standing balance support: No upper extremity supported Standing balance-Leahy Scale: Fair Standing balance comment: Good static balance.  UE support and assist during dynamic activities/gait.                             Pertinent Vitals/Pain Pain Assessment: No/denies pain    Home Living Family/patient expects to be discharged to:: Private residence Living Arrangements: Spouse/significant other Available Help at Discharge: Family;Available 24 hours/day (Patient provides assist to husband who uses RW) Type of Home: Apartment Home Access: Stairs to enter Entrance Stairs-Rails: Right;Left Entrance Stairs-Number of Steps: 3 Home Layout: One level Home Equipment: Cane - single point;Shower seat - built in;Grab bars - toilet;Grab bars - tub/shower      Prior Function Level of Independence: Independent         Comments: Patient only recently started using cane outside of house when feeling "funny".  Patient drives.  Independent with ADL's.  Assists husband at home.     Hand Dominance   Dominant Hand: Left    Extremity/Trunk Assessment   Upper Extremity Assessment: Defer to OT evaluation           Lower Extremity Assessment: RLE deficits/detail RLE Deficits / Details: Patient reports "pins and needles" in Rt foot.  No longer numb.  Strength grossly WFL except DF is 3/5.    Cervical / Trunk Assessment: Kyphotic  Communication   Communication: No difficulties  Cognition Arousal/Alertness: Awake/alert Behavior During Therapy: WFL for tasks assessed/performed Overall Cognitive Status: Within Functional Limits for tasks assessed  General Comments      Exercises     Assessment/Plan    PT Assessment Patient needs continued PT services  PT Problem List Decreased strength;Decreased activity tolerance;Decreased balance;Decreased mobility;Decreased knowledge of use of  DME;Impaired sensation;Obesity          PT Treatment Interventions DME instruction;Gait training;Stair training;Functional mobility training;Therapeutic activities;Balance training;Neuromuscular re-education;Patient/family education    PT Goals (Current goals can be found in the Care Plan section)  Acute Rehab PT Goals Patient Stated Goal: To eat. To return home. PT Goal Formulation: With patient Time For Goal Achievement: 03/23/16 Potential to Achieve Goals: Good    Frequency Min 4X/week   Barriers to discharge Decreased caregiver support Husband would be unable to provide assist.    Co-evaluation               End of Session Equipment Utilized During Treatment: Gait belt Activity Tolerance: Patient tolerated treatment well;Patient limited by fatigue Patient left: in bed;with call bell/phone within reach;with bed alarm set      Functional Assessment Tool Used: Clinical judgement Functional Limitation: Mobility: Walking and moving around Mobility: Walking and Moving Around Current Status ([id number]): At least 1 percent but less than 20 percent impaired, limited or restricted Mobility: Walking and Moving Around Goal Status [phone]([id number]): At least 1 percent but less than 20 percent impaired, limited or restricted    Time: 1747-1806 PT Time Calculation (min) (ACUTE ONLY): 19 min   Charges:   PT Evaluation $PT Eval Moderate Complexity: 1 Procedure     PT G Codes:   PT G-Codes **NOT FOR INPATIENT CLASS** Functional Assessment Tool Used: Clinical judgement Functional Limitation: Mobility: Walking and moving around Mobility: Walking and Moving Around Current Status ([id number]([id number]): At least 1 percent but less than 20 percent impaired, limited or restricted Mobility: Walking and Moving Around Goal Status [phone]([id number]): At least 1 percent but less than 20 percent impaired, limited or restricted    Vena AustriaSusan H Thyra Yinger 03/16/2016, 6:30 PM Durenda HurtSusan H. Renaldo Fiddleravis, PT, [facility] Health [facility] ElizabethMBA Acute Rehab Services Pager  [phone]

## 2016-03-16 NOTE — ED Notes (Signed)
NIH 3. Passed swallow screen. Next neuro check due 1705.

## 2016-03-17 ENCOUNTER — Observation Stay (HOSPITAL_BASED_OUTPATIENT_CLINIC_OR_DEPARTMENT_OTHER): Payer: Medicare Other

## 2016-03-17 DIAGNOSIS — R531 Weakness: Secondary | ICD-10-CM

## 2016-03-17 DIAGNOSIS — I6789 Other cerebrovascular disease: Secondary | ICD-10-CM

## 2016-03-17 DIAGNOSIS — I63019 Cerebral infarction due to thrombosis of unspecified vertebral artery: Secondary | ICD-10-CM

## 2016-03-17 DIAGNOSIS — G459 Transient cerebral ischemic attack, unspecified: Secondary | ICD-10-CM | POA: Diagnosis not present

## 2016-03-17 DIAGNOSIS — R2990 Unspecified symptoms and signs involving the nervous system: Secondary | ICD-10-CM | POA: Diagnosis not present

## 2016-03-17 DIAGNOSIS — I10 Essential (primary) hypertension: Secondary | ICD-10-CM | POA: Diagnosis not present

## 2016-03-17 LAB — ECHOCARDIOGRAM COMPLETE
Height: 65 in
Weight: 2608 oz

## 2016-03-17 LAB — LIPID PANEL
Cholesterol: 158 mg/dL (ref 0–200)
HDL: 82 mg/dL (ref >40)
LDL Cholesterol: 62 mg/dL (ref 0–99)
Total CHOL/HDL Ratio: 1.9 RATIO
Triglycerides: 72 mg/dL (ref <150)
VLDL: 14 mg/dL (ref 0–40)

## 2016-03-17 LAB — TROPONIN I: Troponin I: <0.03 ng/mL (ref <0.03)

## 2016-03-17 MED ORDER — ASPIRIN EC 325 MG PO TBEC
325.0000 mg | DELAYED_RELEASE_TABLET | Freq: Every day | ORAL | Status: DC
Start: 2016-03-18 — End: 2016-03-18
  Administered 2016-03-18: 325 mg via ORAL
  Filled 2016-03-17: qty 1

## 2016-03-17 NOTE — Evaluation (Signed)
Occupational Therapy Evaluation Patient Details Name: [name] MRN: [id number] DOB: 03/30/1941 Today's Date: 03/17/2016    History of Present Illness Patient is a 75 yo female admitted 03/16/16 with Rt-sided weakness and Rt facial droop.  NIHSS = 3.  PMH:  COPD, HTN, CAD, MI, ICM, CHF, HLD, OA, T9 comp fx   Clinical Impression   PT admitted with workup underway for CVA due to R side weakness. MRI (-) at this time.. Pt currently with functional limitiations due to the deficits listed below (see OT problem list). PTA was independent with all adls and iadls.  Pt will benefit from skilled OT to increase their independence and safety with adls and balance to allow discharge home health OT. Concerns for shower transfer due to single leg static standing to transfer.     Follow Up Recommendations  Home health OT    Equipment Recommendations  None recommended by OT    Recommendations for Other Services       Precautions / Restrictions Precautions Precautions: Fall Restrictions Weight Bearing Restrictions: No      Mobility Bed Mobility Overal bed mobility: Independent                Transfers Overall transfer level: Needs assistance Equipment used: Straight cane Transfers: Sit to/from Stand Sit to Stand: Supervision         General transfer comment: vc for safe use of DME; no loss of balance, supervision for educaton    Balance Overall balance assessment: Needs assistance         Standing balance support: No upper extremity supported;During functional activity Standing balance-Leahy Scale: Fair                   Standardized Balance Assessment Standardized Balance Assessment : Berg Balance Test Berg Balance Test Sit to Stand: Able to stand  independently using hands Standing Unsupported: Able to stand safely 2 minutes Sitting with Back Unsupported but Feet Supported on Floor or Stool: Able to sit safely and securely 2 minutes Stand to Sit:  Controls descent by using hands Transfers: Able to transfer safely, definite need of hands Standing Unsupported with Eyes Closed: Able to stand 10 seconds with supervision Standing Ubsupported with Feet Together: Able to place feet together independently but unable to hold for 30 seconds From Standing, Reach Forward with Outstretched Arm: Can reach forward >12 cm safely (5") From Standing Position, Pick up Object from Floor: Able to pick up shoe, needs supervision From Standing Position, Turn to Look Behind Over each Shoulder: Turn sideways only but maintains balance Turn 360 Degrees: Able to turn 360 degrees safely but slowly (15 small steps!) Standing Unsupported, Alternately Place Feet on Step/Stool: Needs assistance to keep from falling or unable to try Standing Unsupported, One Foot in Front: Loses balance while stepping or standing Standing on One Leg: Unable to try or needs assist to prevent fall Total Score: 32        ADL Overall ADL's : Needs assistance/impaired Eating/Feeding: Independent   Grooming: Wash/dry hands               Lower Body Dressing: Supervision/safety   Toilet Transfer: Bankerndependent;Supervision/safety         Tub/Shower Transfer Details (indicate cue type and reason): concerns with shower transfer due to inability with ambualation exiting room with self correction for LOB Functional mobility during ADLs: Supervision/safety;Cane General ADL Comments: pt reports concerns with returning home at current changes happening medically to herself and spouse. She  reports needing to explore other living arrangements. pt reports having recent cold and spouse as well which she believes she caught from grandchild/ son at thanksgiving     Vision     Perception     Praxis      Pertinent Vitals/Pain Pain Assessment: No/denies pain     Hand Dominance Left   Extremity/Trunk Assessment Upper Extremity Assessment Upper Extremity Assessment: Overall WFL for  tasks assessed   Lower Extremity Assessment Lower Extremity Assessment: Defer to PT evaluation;RLE deficits/detail RLE Deficits / Details: reports numbness/ tingling in R LE   Cervical / Trunk Assessment Cervical / Trunk Assessment: Kyphotic   Communication Communication Communication: No difficulties   Cognition Arousal/Alertness: Awake/alert Behavior During Therapy: WFL for tasks assessed/performed Overall Cognitive Status: Within Functional Limits for tasks assessed                     General Comments       Exercises       Shoulder Instructions      Home Living Family/patient expects to be discharged to:: Private residence Living Arrangements: Spouse/significant other Available Help at Discharge: Family;Available 24 hours/day Type of Home: Apartment Home Access: Stairs to enter Entrance Stairs-Number of Steps: 3 Entrance Stairs-Rails: Right;Left Home Layout: One level     Bathroom Shower/Tub: Producer, television/film/videoWalk-in shower   Bathroom Toilet: Handicapped height     Home Equipment: Cane - single point;Shower seat - built in;Grab bars - toilet;Grab bars - tub/shower   Additional Comments: pt reports "i dont know how much longer we can live the way we are. we are going to have to make some changes"      Prior Functioning/Environment Level of Independence: Independent        Comments: Pt reports using cane now two days (Since this started happening) pt is the main caregiver of the house and does all teh driving and shopping        OT Problem List: Decreased activity tolerance;Impaired balance (sitting and/or standing);Decreased knowledge of use of DME or AE;Decreased knowledge of precautions   OT Treatment/Interventions: Self-care/ADL training;Therapeutic exercise;Therapeutic activities;DME and/or AE instruction;Balance training;Patient/family education    OT Goals(Current goals can be found in the care plan section) Acute Rehab OT Goals Patient Stated Goal: To  return home. OT Goal Formulation: With patient Time For Goal Achievement: 03/31/16 Potential to Achieve Goals: Good  OT Frequency: Min 2X/week   Barriers to D/C:            Co-evaluation              End of Session Equipment Utilized During Treatment: Gait belt Nurse Communication: Mobility status;Precautions  Activity Tolerance: Patient tolerated treatment well Patient left: Other (comment) (passed to PT for stair training)   Time: [id number] OT Time Calculation (min): 9 min Charges:  OT General Charges $OT Visit: 1 Procedure OT Evaluation $OT Eval Low Complexity: 1 Procedure G-Codes: OT G-codes **NOT FOR INPATIENT CLASS** Functional Assessment Tool Used: clinical judgement Functional Limitation: Self care Self Care Current Status ([id number]): At least 1 percent but less than 20 percent impaired, limited or restricted Self Care Goal Status ([id number]): At least 1 percent but less than 20 percent impaired, limited or restricted  Harolyn RutherfordJones, Bret Vanessen B 03/17/2016, 12:20 PM  Mateo FlowJones, Brynn   OTR/L Pager: [phone] Office: [phone] .

## 2016-03-17 NOTE — Progress Notes (Signed)
Physical Therapy Treatment Patient Details Name: [name] MRN: [id number] DOB: 06/30/1940 Today's Date: 03/17/2016    History of Present Illness ([name]) Patient is a 75 yo female admitted 03/16/16 with Rt-sided weakness and Rt facial droop.  NIHSS = 3.  PMH:  COPD, HTN, CAD, MI, ICM, CHF, HLD, OA, T9 comp fx    PT Comments    Patient scored 32/56 on Berg Balance Assessment and with 2 staggering losses of balance when walking with cane. Educated on use of RW and pt agreeable to use RW. She is also interested in follow-up PT. We discussed pro's and con's of OPPT vs HHPT and patient agrees to HHPT. (Safety evaluation as RW is new to her and she performs most of home chores/ADLs).   Of note, Rt dorsiflexion muscle testing inconsistent with give away weakness (3+ to 4/5) and Romberg with eyes closed with increased atypical sway (upper body swaying anterior-posterior through spine and slightly with hip flexion/extension with no sway at her ankles). Patient endorses she has been experiencing increased stress recently as she is considering her and husband moving to ALF.    Follow Up Recommendations  Home health PT;Supervision for mobility/OOB     Equipment Recommendations  Rolling walker with 5" wheels    Recommendations for Other Services       Precautions / Restrictions Precautions Precautions: ([name]) Fall Restrictions Weight Bearing Restrictions: No    Mobility  Bed Mobility                  Transfers Overall transfer level: Needs assistance Equipment used: Straight cane;Rolling walker (2 wheeled) Transfers: Sit to/from Stand Sit to Stand: Supervision         General transfer comment: vc for safe use of DME; no loss of balance, supervision for educaton  Ambulation/Gait Ambulation/Gait assistance: Min assist Ambulation Distance (Feet): 80 Feet (with cane; seated rest; 50 with RW) Assistive device: Straight cane;Rolling walker (2 wheeled) Gait Pattern/deviations:  Step-through pattern;Decreased stride length;Staggering left;Wide base of support Gait velocity: decreased   General Gait Details: staggering to left x 2 with cane; pt able to recover without physical assist, however due to imbalance hands-on guarding throughout; no LOB with RW   Stairs Stairs: Yes   Stair Management: One rail Right;Step to pattern;Forwards;With cane Number of Stairs: 6 General stair comments: ascend without difficulty; descending pt initially leading with LLE and unsteady/quick descent with RLE controlling descent; educated to lead with "weak" RLE and controlled descent improved  Wheelchair Mobility    Modified Rankin (Stroke Patients Only) Modified Rankin (Stroke Patients Only) Pre-Morbid Rankin Score: No symptoms Modified Rankin: Moderately severe disability     Balance Overall balance assessment: Needs assistance         Standing balance support: No upper extremity supported Standing balance-Leahy Scale: Fair                      Cognition Arousal/Alertness: ([name]) Awake/alert Behavior During Therapy: ([name]) WFL for tasks assessed/performed Overall Cognitive Status: ([name]) Within Functional Limits for tasks assessed                      Exercises      General Comments        Pertinent Vitals/Pain Pain Assessment: ([name]) No/denies pain    Home Living Family/patient expects to be discharged to:: ([name]) Private residence Living Arrangements: ([name]) Spouse/significant other Available Help at Discharge: ([name]) Family;Available 24 hours/day Type of Home: ([name]) Apartment Home Access: (  [name]) Stairs to enter Entrance Stairs-Rails: ([name]) Right;Left Home Layout: ([name]) One level Home Equipment: ([name]) Cane - single point;Shower seat - built in;Grab bars - toilet;Grab bars - tub/shower Additional Comments: ([name]) pt reports "i dont know how much longer we can live the way we are. we are going to have to make some changes"    Prior Function Level of Independence: ([name])  Independent      Comments: ([name]) Pt reports using cane now two days (Since this started happening) pt is the main caregiver of the house and does all teh driving and shopping   PT Goals (current goals can now be found in the care plan section) Acute Rehab PT Goals Patient Stated Goal: To return home. Time For Goal Achievement: 03/23/16 Progress towards PT goals: Progressing toward goals    Frequency    Min 4X/week      PT Plan Discharge plan needs to be updated;Equipment recommendations need to be updated    Co-evaluation             End of Session Equipment Utilized During Treatment: Gait belt Activity Tolerance: Patient limited by fatigue Patient left: with call bell/phone within reach;in chair;with chair alarm set     Time: 1130-1157 PT Time Calculation (min) (ACUTE ONLY): 27 min  Charges:  $Gait Training: 23-37 mins                    G CodesScherrie Campbell:      [name] [name] 03/17/2016, 12:14 PM Pager [phone]

## 2016-03-17 NOTE — Progress Notes (Signed)
PROGRESS NOTE    Genelda Roark  ZOX:[id number] DOB: [birth year] DOA: 03/16/2016 PCP: Enrique Sack, MD   Outpatient Specialists:     Brief Narrative:  [name] is a 75 y.o. female  with multiple medical issues listed below, presenting to the emergency department with acute onset of right sided facial droop and upper extremity and right lower extremity weakness and numbness. Her last known normal was at 730 in the morning. The patient does not have a prior history of stroke. She does have a history of COPD, hypertension, CAD, hyperlipidemia, ischemic myopathy. The patient was brought here as a code stroke. She denies any fever or chills at this time, recovering from bronchitis 10 days prior , finishing Z pack  She denies any chest pain, but she did have palpitations with some dizziness this morning. She denies any nausea or vomiting. No syncope or pre-syncope. She is not on anticoagulants. She was on Plavix was on 2011 to 2012 after vfib arrest  She denies any leg swelling. No dysphagia reported, No confusion headaches, double vision, blurred vision headaches  or seizures . No hormonal replacement. No recent long distance trips. No tobacco or ETOH or recreational drugs    Assessment & Plan:   Active Problems:   Hyperlipidemia   CAD (coronary artery disease)   Ventricular fibrillation (HCC)   GERD   HTN (hypertension)   Chronic diastolic heart failure (HCC)   Stroke (HCC)   Stroke-like symptoms   History of placement of stent in LAD coronary artery   Acute-sided weakness -MRI negative MRAhead/neck unremarkable -FLP ok -HgbA1c pending -echo pending -await neuro recommendations  Hypertension  -resume home meds  Hyperlipidemia Continue home statins LDL: 62  COPD without exacerbation supportive therapy as needed  GERD, no acute symptoms: Continue PPI  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  Tele shows sinus rhythm   Chronic Diastolic CHF: No acute decompensation.  Last 2 D echo  2013 normal EF 50-55 Gr 1 DD New 2 D echo pending        DVT prophylaxis:  Lovenox   Code Status: Full Code   Family Communication: patient  Disposition Plan:     Consultants:   neuro   Subjective: Episode began with a tremor, feeling better today  Objective: Vitals:   03/16/16 2354 03/17/16 0200 03/17/16 0400 03/17/16 1001  BP: 140/74 132/79 (!) 148/78 140/81  Pulse: 63 68 (!) 57 62  Resp: 18 16 18 18   Temp: 97.7 F (36.5 C) 97.7 F (36.5 C) 97.7 F (36.5 C) 98.2 F (36.8 C)  TempSrc: Oral Oral Oral Oral  SpO2: 94% 94% 94% 94%  Weight:      Height:        Intake/Output Summary (Last 24 hours) at 03/17/16 1339 Last data filed at 03/17/16 1100  Gross per 24 hour  Intake              480 ml  Output                0 ml  Net              480 ml   Filed Weights   03/16/16 1226  Weight: 73.9 kg (163 lb)    Examination:  General exam: Appears calm and comfortable  Respiratory system: Clear to auscultation. Respiratory effort normal. Cardiovascular system: S1 & S2 heard, RRR. No JVD, murmurs, rubs, gallops or clicks. No pedal edema. Gastrointestinal system: Abdomen is nondistended, soft and nontender. No  organomegaly or masses felt. Normal bowel sounds heard. Central nervous system: Alert and oriented. No focal neurological deficits. Extremities: Symmetric 5 x 5 power.    Data Reviewed: I have personally reviewed following labs and imaging studies  CBC:  Recent Labs Lab 03/16/16 1219 03/16/16 1228  WBC 8.1  --   NEUTROABS 6.2  --   HGB 13.0 13.6  HCT 37.3 40.0  MCV 98.7  --   PLT 278  --    Basic Metabolic Panel:  Recent Labs Lab 03/16/16 1219 03/16/16 1228  NA 134* 134*  K 4.5 4.5  CL 98* 95*  CO2 28  --   GLUCOSE 100* 97  BUN 7 12  CREATININE 0.78 0.70  CALCIUM 9.0  --    GFR: Estimated Creatinine Clearance: 60.3 mL/min (by C-G formula based on SCr of 0.7 mg/dL). Liver Function Tests:  Recent Labs Lab  03/16/16 1219  AST 24  ALT 18  ALKPHOS 56  BILITOT 0.7  PROT 6.6  ALBUMIN 3.6   No results for input(s): LIPASE, AMYLASE in the last 168 hours. No results for input(s): AMMONIA in the last 168 hours. Coagulation Profile:  Recent Labs Lab 03/16/16 1219 03/16/16 1816  INR 1.02 0.98   Cardiac Enzymes:  Recent Labs Lab 03/16/16 2340  TROPONINI <0.03   BNP (last 3 results) No results for input(s): PROBNP in the last 8760 hours. HbA1C: No results for input(s): HGBA1C in the last 72 hours. CBG:  Recent Labs Lab 03/16/16 1221  GLUCAP 99   Lipid Profile:  Recent Labs  03/17/16 0243  CHOL 158  HDL 82  LDLCALC 62  TRIG 72  CHOLHDL 1.9   Thyroid Function Tests: No results for input(s): TSH, T4TOTAL, FREET4, T3FREE, THYROIDAB in the last 72 hours. Anemia Panel: No results for input(s): VITAMINB12, FOLATE, FERRITIN, TIBC, IRON, RETICCTPCT in the last 72 hours. Urine analysis:    Component Value Date/Time   COLORURINE YELLOW 03/16/2016 1307   APPEARANCEUR HAZY (A) 03/16/2016 1307   LABSPEC 1.012 03/16/2016 1307   PHURINE 7.0 03/16/2016 1307   GLUCOSEU NEGATIVE 03/16/2016 1307   HGBUR NEGATIVE 03/16/2016 1307   HGBUR negative 05/03/2008 1357   BILIRUBINUR NEGATIVE 03/16/2016 1307   KETONESUR NEGATIVE 03/16/2016 1307   PROTEINUR NEGATIVE 03/16/2016 1307   UROBILINOGEN 0.2 08/08/2013 0721   NITRITE NEGATIVE 03/16/2016 1307   LEUKOCYTESUR LARGE (A) 03/16/2016 1307     )No results found for this or any previous visit (from the past 240 hour(s)).    Anti-infectives    None       Radiology Studies: Dg Chest 2 View  Result Date: 03/16/2016 CLINICAL DATA:  75 year old female with right-sided weakness. Concern for stroke. EXAM: CHEST  2 VIEW COMPARISON:  Chest radiograph dated 03/10/2016 FINDINGS: Two views of the chest demonstrate emphysematous changes of the lungs. There is no focal consolidation, pleural effusion, or pneumothorax. Stable top-normal  cardiac size. There is coronary vascular calcification. The aorta is tortuous. There is osteopenia with degenerative changes of the spine. T9 compression fracture with anterior wedging as seen on the prior radiograph and CT of 03/21/2015. L1 compression fracture with vertebroplasty changes. No acute fracture. IMPRESSION: No active cardiopulmonary disease. Electronically Signed   By: Elgie CollardArash  Radparvar M.D.   On: 03/16/2016 22:12   Mr Maxine GlennMra Neck W Wo Contrast  Result Date: 03/16/2016 CLINICAL DATA:  Right-sided facial droop. Right upper and lower extremity weakness. EXAM: MRI HEAD WITHOUT AND WITH CONTRAST MRA HEAD WITHOUT CONTRAST MRA NECK WITHOUT  AND WITH CONTRAST TECHNIQUE: Multiplanar, multiecho pulse sequences of the brain and surrounding structures were obtained without and with intravenous contrast. Angiographic images of the Circle of Willis were obtained using MRA technique without intravenous contrast. Angiographic images of the neck were obtained using MRA technique without and with intravenous contrast. Carotid stenosis measurements (when applicable) are obtained utilizing NASCET criteria, using the distal internal carotid diameter as the denominator. CONTRAST:  16mL MULTIHANCE GADOBENATE DIMEGLUMINE 529 MG/ML IV SOLN COMPARISON:  Brain MRI 03/10/2016 FINDINGS: MRI HEAD FINDINGS Brain: The midline structures are normal. No acute infarct or intraparenchymal hemorrhage. There is multifocal hyperintense T2-weighted signal within the brainstem and the periventricular and deep white matter, most often seen in the setting of chronic microvascular ischemia. No mass lesion or midline shift. No hydrocephalus or extra-axial fluid collection. No age advanced or lobar predominant atrophy. Vascular: Major intracranial arterial and venous sinus flow voids are preserved. There are a few scattered foci of chronic microhemorrhage. Skull and upper cervical spine: The visualized skull base, calvarium, upper cervical spine  and extracranial soft tissues are normal. Sinuses/Orbits: Bilateral mastoid effusions. Normal orbits. MRA HEAD FINDINGS Intracranial internal carotid arteries: Normal. Anterior cerebral arteries: Normal. Middle cerebral arteries: Normal. Posterior communicating arteries: Present bilaterally. Posterior cerebral arteries: Bilateral fetal origins. Basilar artery: Normal. Vertebral arteries: Left dominant. Normal. Superior cerebellar arteries: Normal. Anterior inferior cerebellar arteries: Normal. Posterior inferior cerebellar arteries: Normal. MRA NECK FINDINGS No carotid or vertebral artery stenosis. IMPRESSION: 1. No acute intracranial abnormality. 2. Findings of chronic microvascular ischemia. 3. Normal MRAs of the head and neck. Electronically Signed   By: Deatra Robinson M.D.   On: 03/16/2016 22:02   Mr Laqueta Jean JX Contrast  Result Date: 03/16/2016 CLINICAL DATA:  Right-sided facial droop. Right upper and lower extremity weakness. EXAM: MRI HEAD WITHOUT AND WITH CONTRAST MRA HEAD WITHOUT CONTRAST MRA NECK WITHOUT AND WITH CONTRAST TECHNIQUE: Multiplanar, multiecho pulse sequences of the brain and surrounding structures were obtained without and with intravenous contrast. Angiographic images of the Circle of Willis were obtained using MRA technique without intravenous contrast. Angiographic images of the neck were obtained using MRA technique without and with intravenous contrast. Carotid stenosis measurements (when applicable) are obtained utilizing NASCET criteria, using the distal internal carotid diameter as the denominator. CONTRAST:  16mL MULTIHANCE GADOBENATE DIMEGLUMINE 529 MG/ML IV SOLN COMPARISON:  Brain MRI 03/10/2016 FINDINGS: MRI HEAD FINDINGS Brain: The midline structures are normal. No acute infarct or intraparenchymal hemorrhage. There is multifocal hyperintense T2-weighted signal within the brainstem and the periventricular and deep white matter, most often seen in the setting of chronic  microvascular ischemia. No mass lesion or midline shift. No hydrocephalus or extra-axial fluid collection. No age advanced or lobar predominant atrophy. Vascular: Major intracranial arterial and venous sinus flow voids are preserved. There are a few scattered foci of chronic microhemorrhage. Skull and upper cervical spine: The visualized skull base, calvarium, upper cervical spine and extracranial soft tissues are normal. Sinuses/Orbits: Bilateral mastoid effusions. Normal orbits. MRA HEAD FINDINGS Intracranial internal carotid arteries: Normal. Anterior cerebral arteries: Normal. Middle cerebral arteries: Normal. Posterior communicating arteries: Present bilaterally. Posterior cerebral arteries: Bilateral fetal origins. Basilar artery: Normal. Vertebral arteries: Left dominant. Normal. Superior cerebellar arteries: Normal. Anterior inferior cerebellar arteries: Normal. Posterior inferior cerebellar arteries: Normal. MRA NECK FINDINGS No carotid or vertebral artery stenosis. IMPRESSION: 1. No acute intracranial abnormality. 2. Findings of chronic microvascular ischemia. 3. Normal MRAs of the head and neck. Electronically Signed   By: Chrisandra Netters.D.  On: 03/16/2016 22:02   Mr Maxine Glenn Head/brain ZO Cm  Result Date: 03/16/2016 CLINICAL DATA:  Right-sided facial droop. Right upper and lower extremity weakness. EXAM: MRI HEAD WITHOUT AND WITH CONTRAST MRA HEAD WITHOUT CONTRAST MRA NECK WITHOUT AND WITH CONTRAST TECHNIQUE: Multiplanar, multiecho pulse sequences of the brain and surrounding structures were obtained without and with intravenous contrast. Angiographic images of the Circle of Willis were obtained using MRA technique without intravenous contrast. Angiographic images of the neck were obtained using MRA technique without and with intravenous contrast. Carotid stenosis measurements (when applicable) are obtained utilizing NASCET criteria, using the distal internal carotid diameter as the denominator.  CONTRAST:  16mL MULTIHANCE GADOBENATE DIMEGLUMINE 529 MG/ML IV SOLN COMPARISON:  Brain MRI 03/10/2016 FINDINGS: MRI HEAD FINDINGS Brain: The midline structures are normal. No acute infarct or intraparenchymal hemorrhage. There is multifocal hyperintense T2-weighted signal within the brainstem and the periventricular and deep white matter, most often seen in the setting of chronic microvascular ischemia. No mass lesion or midline shift. No hydrocephalus or extra-axial fluid collection. No age advanced or lobar predominant atrophy. Vascular: Major intracranial arterial and venous sinus flow voids are preserved. There are a few scattered foci of chronic microhemorrhage. Skull and upper cervical spine: The visualized skull base, calvarium, upper cervical spine and extracranial soft tissues are normal. Sinuses/Orbits: Bilateral mastoid effusions. Normal orbits. MRA HEAD FINDINGS Intracranial internal carotid arteries: Normal. Anterior cerebral arteries: Normal. Middle cerebral arteries: Normal. Posterior communicating arteries: Present bilaterally. Posterior cerebral arteries: Bilateral fetal origins. Basilar artery: Normal. Vertebral arteries: Left dominant. Normal. Superior cerebellar arteries: Normal. Anterior inferior cerebellar arteries: Normal. Posterior inferior cerebellar arteries: Normal. MRA NECK FINDINGS No carotid or vertebral artery stenosis. IMPRESSION: 1. No acute intracranial abnormality. 2. Findings of chronic microvascular ischemia. 3. Normal MRAs of the head and neck. Electronically Signed   By: Deatra Robinson M.D.   On: 03/16/2016 22:02   Ct Head Code Stroke W/o Cm  Result Date: 03/16/2016 CLINICAL DATA:  Code stroke. 75 year old female right side facial droop, right extremity weakness. Last seen normal 0730 hours. Initial encounter. EXAM: CT HEAD WITHOUT CONTRAST TECHNIQUE: Contiguous axial images were obtained from the base of the skull through the vertex without intravenous contrast.  COMPARISON:  Brain MRI and head CT 03/10/2016 FINDINGS: Brain: No midline shift, mass effect, or evidence of intracranial mass lesion. No ventriculomegaly. No acute intracranial hemorrhage identified. Patchy bilateral cerebral white matter hypodensity is stable. Patchy hypodensity in the pons appears stable. No cortically based acute infarct identified. Incidental dystrophic basal ganglia calcifications. Vascular: Calcified atherosclerosis at the skull base. No suspicious intracranial vascular hyperdensity. Skull: No acute osseous abnormality identified. Sinuses/Orbits: Stable and well pneumatized aside from right mastoid effusion. Other: No acute orbit or scalp soft tissue finding. ASPECTS [facility] Stroke Program Early CT Score) - Ganglionic level infarction (caudate, lentiform nuclei, internal capsule, insula, M1-M3 cortex): 7 - Supraganglionic infarction (M4-M6 cortex): 3 Total score (0-10 with 10 being normal): 10 IMPRESSION: 1. No evidence of acute cortically based infarct. No acute intracranial hemorrhage. 2. ASPECTS is 10. 3. Cerebral white matter and pontine hypodensity appears stable compatible with chronic small vessel disease. 4. The above was relayed via text pager to Dr. Ritta Slot on 03/16/2016 at 12:22 . Electronically Signed   By: Odessa Fleming M.D.   On: 03/16/2016 12:23        Scheduled Meds: . [START ON 03/18/2016] aspirin EC  325 mg Oral Daily  . calcium carbonate  1,250 mg Oral QPM  .  cholecalciferol  1,000 Units Oral Daily  . enoxaparin (LOVENOX) injection  40 mg Subcutaneous Q24H  . losartan  50 mg Oral Daily  . metoprolol succinate  25 mg Oral Daily  . pantoprazole  40 mg Oral Daily  . rosuvastatin  20 mg Oral QHS   Continuous Infusions:   LOS: 0 days    Time spent: 25 min    Kourtnei Rauber U Jarvin Ogren, DO [facility] Pager [phone]  If 7PM-7AM, please contact night-coverage [email or web address] Password [email or web address] 03/17/2016, 1:39 PM

## 2016-03-17 NOTE — Progress Notes (Signed)
  Echocardiogram 2D Echocardiogram has been performed.  Janalyn HarderWest, Jeremiyah Cullens R 03/17/2016, 4:26 PM

## 2016-03-17 NOTE — Progress Notes (Signed)
STROKE TEAM PROGRESS NOTE   HISTORY OF PRESENT ILLNESS (per record) [name] is a 75 y.o. female with a history of arrhythmia who presents with right-sided weakness started around 8:30 AM On 03/16/2016 (last known well). She states that is been persistent since this time. She called 911 and was brought into the emergency department and arrived shortly before the end of the 4-1/2 hour IV tPA window, however after evaluation she did not have any clear disabling deficit with only mild weakness, preserved ability to walk, intact fine motor ability and therefore TPA was not pursued. She was admitted for further evaluation and treatment.   SUBJECTIVE (INTERVAL HISTORY) No family is present at bedside. Overall, she feels stable. She reports recent increase in stress at home. [facility]. Her neurologic episode began with bilateral tremulousness, which is atypical for a strokelike episode   OBJECTIVE Temp:  [97.5 F (36.4 C)-98.7 F (37.1 C)] 97.7 F (36.5 C) (12/12 0400) Pulse Rate:  [56-70] 57 (12/12 0400) Cardiac Rhythm: Sinus bradycardia (12/11 1912) Resp:  [13-20] 18 (12/12 0400) BP: (132-176)/(70-86) 148/78 (12/12 0400) SpO2:  [91 %-97 %] 94 % (12/12 0400) Weight:  [73.9 kg (163 lb)] 73.9 kg (163 lb) (12/11 1226)  CBC:  Recent Labs Lab 03/10/16 1153 03/16/16 1219 03/16/16 1228  WBC 5.4 8.1  --   NEUTROABS 3.9 6.2  --   HGB 12.3 13.0 13.6  HCT 36.3 37.3 40.0  MCV 99.2 98.7  --   PLT 226 278  --     Basic Metabolic Panel:  Recent Labs Lab 03/10/16 1153 03/16/16 1219 03/16/16 1228  NA 133* 134* 134*  K 4.1 4.5 4.5  CL 98* 98* 95*  CO2 32 28  --   GLUCOSE 119* 100* 97  BUN 8 7 12   CREATININE 0.74 0.78 0.70  CALCIUM 8.7* 9.0  --     Lipid Panel:    Component Value Date/Time   CHOL 158 03/17/2016 0243   TRIG 72 03/17/2016 0243   HDL 82 03/17/2016 0243   CHOLHDL 1.9 03/17/2016 0243   VLDL 14 03/17/2016 0243   LDLCALC 62  03/17/2016 0243   HgbA1c:  Lab Results  Component Value Date   HGBA1C (H) 08/14/2009    5.7 (NOTE)                                                                       According to the ADA Clinical Practice Recommendations for 2011, when HbA1c is used as a screening test:   >=6.5%   Diagnostic of Diabetes Mellitus           (if abnormal result  is confirmed)  5.7-6.4%   Increased risk of developing Diabetes Mellitus  References:Diagnosis and Classification of Diabetes Mellitus,Diabetes Care,2011,34(Suppl 1):S62-S69 and Standards of Medical Care in         Diabetes - 2011,Diabetes Care,2011,34  (Suppl 1):S11-S61.   Urine Drug Screen:    Component Value Date/Time   LABOPIA NONE DETECTED 03/16/2016 1932   COCAINSCRNUR NONE DETECTED 03/16/2016 1932   LABBENZ NONE DETECTED 03/16/2016 1932   AMPHETMU NONE DETECTED 03/16/2016 1932   THCU NONE DETECTED 03/16/2016 1932   LABBARB NONE DETECTED 03/16/2016 1932  IMAGING  Dg Chest 2 View  Result Date: 03/16/2016 CLINICAL DATA:  75 year old female with right-sided weakness. Concern for stroke. EXAM: CHEST  2 VIEW COMPARISON:  Chest radiograph dated 03/10/2016 FINDINGS: Two views of the chest demonstrate emphysematous changes of the lungs. There is no focal consolidation, pleural effusion, or pneumothorax. Stable top-normal cardiac size. There is coronary vascular calcification. The aorta is tortuous. There is osteopenia with degenerative changes of the spine. T9 compression fracture with anterior wedging as seen on the prior radiograph and CT of 03/21/2015. L1 compression fracture with vertebroplasty changes. No acute fracture. IMPRESSION: No active cardiopulmonary disease. Electronically Signed   By: Elgie CollardArash  Radparvar M.D.   On: 03/16/2016 22:12   Mr Maxine GlennMra Neck W Wo Contrast  Result Date: 03/16/2016 CLINICAL DATA:  Right-sided facial droop. Right upper and lower extremity weakness. EXAM: MRI HEAD WITHOUT AND WITH CONTRAST MRA HEAD WITHOUT  CONTRAST MRA NECK WITHOUT AND WITH CONTRAST TECHNIQUE: Multiplanar, multiecho pulse sequences of the brain and surrounding structures were obtained without and with intravenous contrast. Angiographic images of the Circle of Willis were obtained using MRA technique without intravenous contrast. Angiographic images of the neck were obtained using MRA technique without and with intravenous contrast. Carotid stenosis measurements (when applicable) are obtained utilizing NASCET criteria, using the distal internal carotid diameter as the denominator. CONTRAST:  16mL MULTIHANCE GADOBENATE DIMEGLUMINE 529 MG/ML IV SOLN COMPARISON:  Brain MRI 03/10/2016 FINDINGS: MRI HEAD FINDINGS Brain: The midline structures are normal. No acute infarct or intraparenchymal hemorrhage. There is multifocal hyperintense T2-weighted signal within the brainstem and the periventricular and deep white matter, most often seen in the setting of chronic microvascular ischemia. No mass lesion or midline shift. No hydrocephalus or extra-axial fluid collection. No age advanced or lobar predominant atrophy. Vascular: Major intracranial arterial and venous sinus flow voids are preserved. There are a few scattered foci of chronic microhemorrhage. Skull and upper cervical spine: The visualized skull base, calvarium, upper cervical spine and extracranial soft tissues are normal. Sinuses/Orbits: Bilateral mastoid effusions. Normal orbits. MRA HEAD FINDINGS Intracranial internal carotid arteries: Normal. Anterior cerebral arteries: Normal. Middle cerebral arteries: Normal. Posterior communicating arteries: Present bilaterally. Posterior cerebral arteries: Bilateral fetal origins. Basilar artery: Normal. Vertebral arteries: Left dominant. Normal. Superior cerebellar arteries: Normal. Anterior inferior cerebellar arteries: Normal. Posterior inferior cerebellar arteries: Normal. MRA NECK FINDINGS No carotid or vertebral artery stenosis. IMPRESSION: 1. No acute  intracranial abnormality. 2. Findings of chronic microvascular ischemia. 3. Normal MRAs of the head and neck. Electronically Signed   By: Deatra RobinsonKevin  Herman M.D.   On: 03/16/2016 22:02   Mr Laqueta JeanBrain W ZOWo Contrast  Result Date: 03/16/2016 CLINICAL DATA:  Right-sided facial droop. Right upper and lower extremity weakness. EXAM: MRI HEAD WITHOUT AND WITH CONTRAST MRA HEAD WITHOUT CONTRAST MRA NECK WITHOUT AND WITH CONTRAST TECHNIQUE: Multiplanar, multiecho pulse sequences of the brain and surrounding structures were obtained without and with intravenous contrast. Angiographic images of the Circle of Willis were obtained using MRA technique without intravenous contrast. Angiographic images of the neck were obtained using MRA technique without and with intravenous contrast. Carotid stenosis measurements (when applicable) are obtained utilizing NASCET criteria, using the distal internal carotid diameter as the denominator. CONTRAST:  16mL MULTIHANCE GADOBENATE DIMEGLUMINE 529 MG/ML IV SOLN COMPARISON:  Brain MRI 03/10/2016 FINDINGS: MRI HEAD FINDINGS Brain: The midline structures are normal. No acute infarct or intraparenchymal hemorrhage. There is multifocal hyperintense T2-weighted signal within the brainstem and the periventricular and deep white matter, most often seen in  the setting of chronic microvascular ischemia. No mass lesion or midline shift. No hydrocephalus or extra-axial fluid collection. No age advanced or lobar predominant atrophy. Vascular: Major intracranial arterial and venous sinus flow voids are preserved. There are a few scattered foci of chronic microhemorrhage. Skull and upper cervical spine: The visualized skull base, calvarium, upper cervical spine and extracranial soft tissues are normal. Sinuses/Orbits: Bilateral mastoid effusions. Normal orbits. MRA HEAD FINDINGS Intracranial internal carotid arteries: Normal. Anterior cerebral arteries: Normal. Middle cerebral arteries: Normal. Posterior  communicating arteries: Present bilaterally. Posterior cerebral arteries: Bilateral fetal origins. Basilar artery: Normal. Vertebral arteries: Left dominant. Normal. Superior cerebellar arteries: Normal. Anterior inferior cerebellar arteries: Normal. Posterior inferior cerebellar arteries: Normal. MRA NECK FINDINGS No carotid or vertebral artery stenosis. IMPRESSION: 1. No acute intracranial abnormality. 2. Findings of chronic microvascular ischemia. 3. Normal MRAs of the head and neck. Electronically Signed   By: Deatra Robinson M.D.   On: 03/16/2016 22:02   Mr Maxine Glenn Head/brain ZO Cm  Result Date: 03/16/2016 CLINICAL DATA:  Right-sided facial droop. Right upper and lower extremity weakness. EXAM: MRI HEAD WITHOUT AND WITH CONTRAST MRA HEAD WITHOUT CONTRAST MRA NECK WITHOUT AND WITH CONTRAST TECHNIQUE: Multiplanar, multiecho pulse sequences of the brain and surrounding structures were obtained without and with intravenous contrast. Angiographic images of the Circle of Willis were obtained using MRA technique without intravenous contrast. Angiographic images of the neck were obtained using MRA technique without and with intravenous contrast. Carotid stenosis measurements (when applicable) are obtained utilizing NASCET criteria, using the distal internal carotid diameter as the denominator. CONTRAST:  16mL MULTIHANCE GADOBENATE DIMEGLUMINE 529 MG/ML IV SOLN COMPARISON:  Brain MRI 03/10/2016 FINDINGS: MRI HEAD FINDINGS Brain: The midline structures are normal. No acute infarct or intraparenchymal hemorrhage. There is multifocal hyperintense T2-weighted signal within the brainstem and the periventricular and deep white matter, most often seen in the setting of chronic microvascular ischemia. No mass lesion or midline shift. No hydrocephalus or extra-axial fluid collection. No age advanced or lobar predominant atrophy. Vascular: Major intracranial arterial and venous sinus flow voids are preserved. There are a few  scattered foci of chronic microhemorrhage. Skull and upper cervical spine: The visualized skull base, calvarium, upper cervical spine and extracranial soft tissues are normal. Sinuses/Orbits: Bilateral mastoid effusions. Normal orbits. MRA HEAD FINDINGS Intracranial internal carotid arteries: Normal. Anterior cerebral arteries: Normal. Middle cerebral arteries: Normal. Posterior communicating arteries: Present bilaterally. Posterior cerebral arteries: Bilateral fetal origins. Basilar artery: Normal. Vertebral arteries: Left dominant. Normal. Superior cerebellar arteries: Normal. Anterior inferior cerebellar arteries: Normal. Posterior inferior cerebellar arteries: Normal. MRA NECK FINDINGS No carotid or vertebral artery stenosis. IMPRESSION: 1. No acute intracranial abnormality. 2. Findings of chronic microvascular ischemia. 3. Normal MRAs of the head and neck. Electronically Signed   By: Deatra Robinson M.D.   On: 03/16/2016 22:02   Ct Head Code Stroke W/o Cm  Result Date: 03/16/2016 CLINICAL DATA:  Code stroke. 75 year old female right side facial droop, right extremity weakness. Last seen normal 0730 hours. Initial encounter. EXAM: CT HEAD WITHOUT CONTRAST TECHNIQUE: Contiguous axial images were obtained from the base of the skull through the vertex without intravenous contrast. COMPARISON:  Brain MRI and head CT 03/10/2016 FINDINGS: Brain: No midline shift, mass effect, or evidence of intracranial mass lesion. No ventriculomegaly. No acute intracranial hemorrhage identified. Patchy bilateral cerebral white matter hypodensity is stable. Patchy hypodensity in the pons appears stable. No cortically based acute infarct identified. Incidental dystrophic basal ganglia calcifications. Vascular: Calcified atherosclerosis at the skull  base. No suspicious intracranial vascular hyperdensity. Skull: No acute osseous abnormality identified. Sinuses/Orbits: Stable and well pneumatized aside from right mastoid effusion.  Other: No acute orbit or scalp soft tissue finding. ASPECTS [facility] Stroke Program Early CT Score) - Ganglionic level infarction (caudate, lentiform nuclei, internal capsule, insula, M1-M3 cortex): 7 - Supraganglionic infarction (M4-M6 cortex): 3 Total score (0-10 with 10 being normal): 10 IMPRESSION: 1. No evidence of acute cortically based infarct. No acute intracranial hemorrhage. 2. ASPECTS is 10. 3. Cerebral white matter and pontine hypodensity appears stable compatible with chronic small vessel disease. 4. The above was relayed via text pager to Dr. Ritta Slot on 03/16/2016 at 12:22 . Electronically Signed   By: Odessa Fleming M.D.   On: 03/16/2016 12:23    PHYSICAL EXAM Pleasant elderly Caucasian lady not in distress. . Afebrile. Head is nontraumatic. Neck is supple without bruit.    Cardiac exam no murmur or gallop. Lungs are clear to auscultation. Distal pulses are well felt. Neurological Exam ;  Awake  Alert oriented x 3. Normal speech and language.eye movements full without nystagmus.fundi were not visualized. Vision acuity and fields appear normal. Hearing is normal. Palatal movements are normal. Face symmetric. Tongue midline. Normal strength, tone, reflexes and coordination. Normal sensation. Gait deferred.  ASSESSMENT/PLAN [name] is a 75 y.o. 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 She did not receive IV t-PA due to mild symptoms.   Anxiety versus TIA like episode  MRI  No acute stroke   MRA head  Unremarkable   MRA neck Unremarkable   2D Echo  pending   LDL 62  HgbA1c pending  Lovenox 40 mg sq daily for VTE prophylaxis  Diet Heart Room service appropriate? Yes; Fluid consistency: Thin  aspirin 81 mg daily prior to admission, now on aspirin 81 mg daily. Increase aspirin dose to 325 mg daily for neuro protection  Patient counseled to be compliant with her antithrombotic  medications  Ongoing aggressive stroke risk factor management  Therapy recommendations:  HH PT, HH OT, rolling walker with 5 inch wheels  Disposition:  Return home with home health  Hypertension  Stable  BP goal normotensive  Hyperlipidemia  Home meds:  Crestor 20 mg daily, resumed in hospital  LDL 65, goal < 70  Continue statin at discharge  Other Stroke Risk Factors  Advanced age  Overweight, Body mass index is 27.12 kg/m., recommend weight loss, diet and exercise as appropriate   No documented history of previous stroke found  Coronary artery disease - 08/2009: Ant MI c/b v-fib arrest, s/p PTCA/BMS to LAD  Chronic diastolic congestive heart failure  Other Active Problems  COPD  GERD  History of right hydronephrosis  Hospital day # 0  [facility] See Amion for Pager information 03/17/2016 1:32 PM  I have personally examined this patient, reviewed notes, independently viewed imaging studies, participated in medical decision making and plan of care.ROS completed by me personally and pertinent positives fully documented  I have made any additions or clarifications directly to the above note. Agree with note above. . She presented with an unusual episode of generalized tremulousness followed by transient right-sided weakness unclear whether this was a TIA like episode on due to underlying anxiety stress. Brain imaging negative for acute stroke. Recommend continue ongoing stroke evaluation and risk factor modification. Greater than 50% time during this 25 minute visit was spent on counseling and coordination of care  about TIA and anxiety  Delia Heady, MD Medical Director [facility] Pager: [phone] 03/17/2016 1:40 PM  To contact Stroke Continuity provider, please refer to [email or web address]. After hours, contact General Neurology

## 2016-03-17 NOTE — Care Management Note (Signed)
Case Management Note  Patient Details  Name: [name] MRN: [id number] Date of Birth: 04/09/1940  Subjective/Objective:   Pt in with r/o CVA. MRI results negative. She is from home with her husband.                  Action/Plan: PT recommending outpatient therapy. Await OT. CM following for d/c needs.   Expected Discharge Date:   (Pending)               Expected Discharge Plan:  Home w Home Health Services  In-House Referral:     Discharge planning Services  CM Consult  Post Acute Care Choice:    Choice offered to:     DME Arranged:    DME Agency:     HH Arranged:    HH Agency:     Status of Service:  In process, will continue to follow  If discussed at Long Length of Stay Meetings, dates discussed:    Additional Comments:  Kermit BaloKelli F Laylia Mui, RN 03/17/2016, 10:48 AM

## 2016-03-17 NOTE — Care Management Obs Status (Signed)
MEDICARE OBSERVATION STATUS NOTIFICATION   Patient Details  Name: [name] MRN: [id number] Date of Birth: 11/21/1940   Medicare Observation Status Notification Given:  Yes (MRI negative.)    Kermit BaloKelli F Liyah Higham, RN 03/17/2016, 12:18 PM

## 2016-03-18 DIAGNOSIS — G459 Transient cerebral ischemic attack, unspecified: Secondary | ICD-10-CM

## 2016-03-18 DIAGNOSIS — I63019 Cerebral infarction due to thrombosis of unspecified vertebral artery: Secondary | ICD-10-CM | POA: Diagnosis not present

## 2016-03-18 DIAGNOSIS — I10 Essential (primary) hypertension: Secondary | ICD-10-CM | POA: Diagnosis not present

## 2016-03-18 LAB — HEMOGLOBIN A1C
Hgb A1c MFr Bld: 5.8 % — ABNORMAL HIGH (ref 4.8–5.6)
Mean Plasma Glucose: 120 mg/dL

## 2016-03-18 MED ORDER — ASPIRIN 325 MG PO TBEC: 325.0000 mg | DELAYED_RELEASE_TABLET | Freq: Every day | ORAL | 0 refills | Status: DC

## 2016-03-18 NOTE — Progress Notes (Signed)
SLP Cancellation Note  Patient Details Name: [name] MRN: [id number] DOB: 12/17/1940   Cancelled treatment:       Reason Eval/Treat Not Completed: SLP screened, no needs identified, will sign off.   [name], M.A., CCC-SLP [phone]  [name] 03/18/2016, 11:54 AM

## 2016-03-18 NOTE — Progress Notes (Signed)
STROKE TEAM PROGRESS NOTE   HISTORY OF PRESENT ILLNESS (per record) [name] is a 75 y.o. female with a history of arrhythmia who presents with right-sided weakness started around 8:30 AM On 03/16/2016 (last known well). She states that is been persistent since this time. She called 911 and was brought into the emergency department and arrived shortly before the end of the 4-1/2 hour IV tPA window, however after evaluation she did not have any clear disabling deficit with only mild weakness, preserved ability to walk, intact fine motor ability and therefore TPA was not pursued. She was admitted for further evaluation and treatment.   SUBJECTIVE (INTERVAL HISTORY) No family is present at bedside. Overall, she feels stable.  She is ready to be discharged OBJECTIVE Temp:  [97.5 F (36.4 C)-98.6 F (37 C)] 97.9 F (36.6 C) (12/13 0955) Pulse Rate:  [62-72] 72 (12/13 0955) Cardiac Rhythm: Normal sinus rhythm (12/13 0700) Resp:  [18-20] 20 (12/13 0955) BP: (120-176)/(70-87) 120/70 (12/13 0955) SpO2:  [94 %-95 %] 95 % (12/13 0955)  CBC:   Recent Labs Lab 03/16/16 1219 03/16/16 1228  WBC 8.1  --   NEUTROABS 6.2  --   HGB 13.0 13.6  HCT 37.3 40.0  MCV 98.7  --   PLT 278  --     Basic Metabolic Panel:   Recent Labs Lab 03/16/16 1219 03/16/16 1228  NA 134* 134*  K 4.5 4.5  CL 98* 95*  CO2 28  --   GLUCOSE 100* 97  BUN 7 12  CREATININE 0.78 0.70  CALCIUM 9.0  --     Lipid Panel:     Component Value Date/Time   CHOL 158 03/17/2016 0243   TRIG 72 03/17/2016 0243   HDL 82 03/17/2016 0243   CHOLHDL 1.9 03/17/2016 0243   VLDL 14 03/17/2016 0243   LDLCALC 62 03/17/2016 0243   HgbA1c:  Lab Results  Component Value Date   HGBA1C 5.8 (H) 03/17/2016   Urine Drug Screen:     Component Value Date/Time   LABOPIA NONE DETECTED 03/16/2016 1932   COCAINSCRNUR NONE DETECTED 03/16/2016 1932   LABBENZ NONE DETECTED 03/16/2016 1932   AMPHETMU NONE DETECTED 03/16/2016 1932    THCU NONE DETECTED 03/16/2016 1932   LABBARB NONE DETECTED 03/16/2016 1932      IMAGING  Dg Chest 2 View  Result Date: 03/16/2016 CLINICAL DATA:  75 year old female with right-sided weakness. Concern for stroke. EXAM: CHEST  2 VIEW COMPARISON:  Chest radiograph dated 03/10/2016 FINDINGS: Two views of the chest demonstrate emphysematous changes of the lungs. There is no focal consolidation, pleural effusion, or pneumothorax. Stable top-normal cardiac size. There is coronary vascular calcification. The aorta is tortuous. There is osteopenia with degenerative changes of the spine. T9 compression fracture with anterior wedging as seen on the prior radiograph and CT of 03/21/2015. L1 compression fracture with vertebroplasty changes. No acute fracture. IMPRESSION: No active cardiopulmonary disease. Electronically Signed   By: Elgie CollardArash  Radparvar M.D.   On: 03/16/2016 22:12   Mr Maxine GlennMra Neck W Wo Contrast  Result Date: 03/16/2016 CLINICAL DATA:  Right-sided facial droop. Right upper and lower extremity weakness. EXAM: MRI HEAD WITHOUT AND WITH CONTRAST MRA HEAD WITHOUT CONTRAST MRA NECK WITHOUT AND WITH CONTRAST TECHNIQUE: Multiplanar, multiecho pulse sequences of the brain and surrounding structures were obtained without and with intravenous contrast. Angiographic images of the Circle of Willis were obtained using MRA technique without intravenous contrast. Angiographic images of the neck were obtained using MRA technique  without and with intravenous contrast. Carotid stenosis measurements (when applicable) are obtained utilizing NASCET criteria, using the distal internal carotid diameter as the denominator. CONTRAST:  16mL MULTIHANCE GADOBENATE DIMEGLUMINE 529 MG/ML IV SOLN COMPARISON:  Brain MRI 03/10/2016 FINDINGS: MRI HEAD FINDINGS Brain: The midline structures are normal. No acute infarct or intraparenchymal hemorrhage. There is multifocal hyperintense T2-weighted signal within the brainstem and the  periventricular and deep white matter, most often seen in the setting of chronic microvascular ischemia. No mass lesion or midline shift. No hydrocephalus or extra-axial fluid collection. No age advanced or lobar predominant atrophy. Vascular: Major intracranial arterial and venous sinus flow voids are preserved. There are a few scattered foci of chronic microhemorrhage. Skull and upper cervical spine: The visualized skull base, calvarium, upper cervical spine and extracranial soft tissues are normal. Sinuses/Orbits: Bilateral mastoid effusions. Normal orbits. MRA HEAD FINDINGS Intracranial internal carotid arteries: Normal. Anterior cerebral arteries: Normal. Middle cerebral arteries: Normal. Posterior communicating arteries: Present bilaterally. Posterior cerebral arteries: Bilateral fetal origins. Basilar artery: Normal. Vertebral arteries: Left dominant. Normal. Superior cerebellar arteries: Normal. Anterior inferior cerebellar arteries: Normal. Posterior inferior cerebellar arteries: Normal. MRA NECK FINDINGS No carotid or vertebral artery stenosis. IMPRESSION: 1. No acute intracranial abnormality. 2. Findings of chronic microvascular ischemia. 3. Normal MRAs of the head and neck. Electronically Signed   By: Deatra RobinsonKevin  Herman M.D.   On: 03/16/2016 22:02   Mr Laqueta JeanBrain W ZOWo Contrast  Result Date: 03/16/2016 CLINICAL DATA:  Right-sided facial droop. Right upper and lower extremity weakness. EXAM: MRI HEAD WITHOUT AND WITH CONTRAST MRA HEAD WITHOUT CONTRAST MRA NECK WITHOUT AND WITH CONTRAST TECHNIQUE: Multiplanar, multiecho pulse sequences of the brain and surrounding structures were obtained without and with intravenous contrast. Angiographic images of the Circle of Willis were obtained using MRA technique without intravenous contrast. Angiographic images of the neck were obtained using MRA technique without and with intravenous contrast. Carotid stenosis measurements (when applicable) are obtained utilizing NASCET  criteria, using the distal internal carotid diameter as the denominator. CONTRAST:  16mL MULTIHANCE GADOBENATE DIMEGLUMINE 529 MG/ML IV SOLN COMPARISON:  Brain MRI 03/10/2016 FINDINGS: MRI HEAD FINDINGS Brain: The midline structures are normal. No acute infarct or intraparenchymal hemorrhage. There is multifocal hyperintense T2-weighted signal within the brainstem and the periventricular and deep white matter, most often seen in the setting of chronic microvascular ischemia. No mass lesion or midline shift. No hydrocephalus or extra-axial fluid collection. No age advanced or lobar predominant atrophy. Vascular: Major intracranial arterial and venous sinus flow voids are preserved. There are a few scattered foci of chronic microhemorrhage. Skull and upper cervical spine: The visualized skull base, calvarium, upper cervical spine and extracranial soft tissues are normal. Sinuses/Orbits: Bilateral mastoid effusions. Normal orbits. MRA HEAD FINDINGS Intracranial internal carotid arteries: Normal. Anterior cerebral arteries: Normal. Middle cerebral arteries: Normal. Posterior communicating arteries: Present bilaterally. Posterior cerebral arteries: Bilateral fetal origins. Basilar artery: Normal. Vertebral arteries: Left dominant. Normal. Superior cerebellar arteries: Normal. Anterior inferior cerebellar arteries: Normal. Posterior inferior cerebellar arteries: Normal. MRA NECK FINDINGS No carotid or vertebral artery stenosis. IMPRESSION: 1. No acute intracranial abnormality. 2. Findings of chronic microvascular ischemia. 3. Normal MRAs of the head and neck. Electronically Signed   By: Deatra RobinsonKevin  Herman M.D.   On: 03/16/2016 22:02   Mr Maxine GlennMra Head/brain XWWo Cm  Result Date: 03/16/2016 CLINICAL DATA:  Right-sided facial droop. Right upper and lower extremity weakness. EXAM: MRI HEAD WITHOUT AND WITH CONTRAST MRA HEAD WITHOUT CONTRAST MRA NECK WITHOUT AND WITH CONTRAST TECHNIQUE:  Multiplanar, multiecho pulse sequences of the  brain and surrounding structures were obtained without and with intravenous contrast. Angiographic images of the Circle of Willis were obtained using MRA technique without intravenous contrast. Angiographic images of the neck were obtained using MRA technique without and with intravenous contrast. Carotid stenosis measurements (when applicable) are obtained utilizing NASCET criteria, using the distal internal carotid diameter as the denominator. CONTRAST:  16mL MULTIHANCE GADOBENATE DIMEGLUMINE 529 MG/ML IV SOLN COMPARISON:  Brain MRI 03/10/2016 FINDINGS: MRI HEAD FINDINGS Brain: The midline structures are normal. No acute infarct or intraparenchymal hemorrhage. There is multifocal hyperintense T2-weighted signal within the brainstem and the periventricular and deep white matter, most often seen in the setting of chronic microvascular ischemia. No mass lesion or midline shift. No hydrocephalus or extra-axial fluid collection. No age advanced or lobar predominant atrophy. Vascular: Major intracranial arterial and venous sinus flow voids are preserved. There are a few scattered foci of chronic microhemorrhage. Skull and upper cervical spine: The visualized skull base, calvarium, upper cervical spine and extracranial soft tissues are normal. Sinuses/Orbits: Bilateral mastoid effusions. Normal orbits. MRA HEAD FINDINGS Intracranial internal carotid arteries: Normal. Anterior cerebral arteries: Normal. Middle cerebral arteries: Normal. Posterior communicating arteries: Present bilaterally. Posterior cerebral arteries: Bilateral fetal origins. Basilar artery: Normal. Vertebral arteries: Left dominant. Normal. Superior cerebellar arteries: Normal. Anterior inferior cerebellar arteries: Normal. Posterior inferior cerebellar arteries: Normal. MRA NECK FINDINGS No carotid or vertebral artery stenosis. IMPRESSION: 1. No acute intracranial abnormality. 2. Findings of chronic microvascular ischemia. 3. Normal MRAs of the head  and neck. Electronically Signed   By: Deatra Robinson M.D.   On: 03/16/2016 22:02    PHYSICAL EXAM Pleasant elderly Caucasian lady not in distress. . Afebrile. Head is nontraumatic. Neck is supple without bruit.    Cardiac exam no murmur or gallop. Lungs are clear to auscultation. Distal pulses are well felt. Neurological Exam ;  Awake  Alert oriented x 3. Normal speech and language.eye movements full without nystagmus.fundi were not visualized. Vision acuity and fields appear normal. Hearing is normal. Palatal movements are normal. Face symmetric. Tongue midline. Normal strength, tone, reflexes and coordination. Normal sensation. Gait deferred.  ASSESSMENT/PLAN [name] is a 75 y.o. 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 She did not receive IV t-PA due to mild symptoms.   Anxiety versus TIA like episode  MRI  No acute stroke   MRA head  Unremarkable   MRA neck Unremarkable  2D Echo Left ventricle: The cavity size was normal. Wall thickness was   increased in a pattern of mild LVH. Systolic function was normal.   The estimated ejection fraction was in the range of 55% to 60%.   Wall motion was normal; there were no regional wall motion    abnormalities.  LDL 62  HgbA1c 5.8  Lovenox 40 mg sq daily for VTE prophylaxis Diet Heart Room service appropriate? Yes; Fluid consistency: Thin Diet - low sodium heart healthy Diet Carb Modified  aspirin 81 mg daily prior to admission, now on aspirin 81 mg daily. Increase aspirin dose to 325 mg daily for neuro protection  Patient counseled to be compliant with her antithrombotic medications  Ongoing aggressive stroke risk factor management  Therapy recommendations:  HH PT, HH OT, rolling walker with 5 inch wheels  Disposition:  Return home with home health  Hypertension  Stable  BP goal normotensive  Hyperlipidemia  Home meds:  Crestor 20 mg daily,  resumed  in hospital  LDL 65, goal < 70  Continue statin at discharge  Other Stroke Risk Factors  Advanced age  Overweight, Body mass index is 27.12 kg/m., recommend weight loss, diet and exercise as appropriate   No documented history of previous stroke found  Coronary artery disease - 08/2009: Ant MI c/b v-fib arrest, s/p PTCA/BMS to LAD  Chronic diastolic congestive heart failure  Other Active Problems  COPD  GERD  History of right hydronephrosis  Hospital day # 0  SETHI,PRAMOD  [facility] See Amion for Pager information 03/18/2016 4:34 PM  I have personally examined this patient, reviewed notes, independently viewed imaging studies, participated in medical decision making and plan of care.ROS completed by me personally and pertinent positives fully documented  I have made any additions or clarifications directly to the above note. Agree with note above. . She presented with an unusual episode of generalized tremulousness followed by transient right-sided weakness unclear whether this was a TIA like episode on due to underlying anxiety stress. Brain imaging negative for acute stroke. Recommend discharge home today  Delia Heady, MD Medical Director [facility] Pager: [phone] 03/18/2016 4:34 PM  To contact Stroke Continuity provider, please refer to [email or web address]. After hours, contact General Neurology

## 2016-03-18 NOTE — Progress Notes (Signed)
Discharge instructions, RX's and follow up appts explained and provided to patient verbalized understanding. Patient left floor via wheelchair accompanied by staff no c/o pain or shortness of breath at discharge.  Galen Malkowski Lynn, RN  

## 2016-03-18 NOTE — Care Management Note (Addendum)
Case Management Note  Patient Details  Name: Teanna Elem MRN: [id number] Date of Birth: [birth year]  Subjective/Objective:                    Action/Plan: Pt discharging home with orders for [facility] services. CM met with the patient and provided her a list of Organ agencies in [place]. She selected [facility]. Santiago Glad with [facility] notified and accepted the referral. Pt has already received her walker for home. Pt interested in having list of ALF's in the area. CM provided her a list.   Expected Discharge Date:   (Pending)               Expected Discharge Plan:  [place]  In-House Referral:     Discharge planning Services  CM Consult  Post Acute Care Choice:  Durable Medical Equipment Choice offered to:     DME Arranged:  Walker rolling DME Agency:  [place] Arranged:  PT, OT [facility] Agency:  [place]  Status of Service:  Completed, signed off  If discussed at [place] of Stay Meetings, dates discussed:    Additional Comments:  Pollie Friar, RN 03/18/2016, 10:53 AM

## 2016-03-18 NOTE — Progress Notes (Signed)
Physical Therapy Treatment Patient Details Name: [name] MRN: [id number] DOB: 09/30/1940 Today's Date: 03/18/2016    History of Present Illness Patient is a 75 yo female admitted 03/16/16 with Rt-sided weakness and Rt facial droop.  NIHSS = 3.  PMH:  COPD, HTN, CAD, MI, ICM, CHF, HLD, OA, T9 comp fx    PT Comments    Patient more fatigued with activity today. Reports episodes of diarrhea and feels weaker. Was able to ambulate 100 ft with minguard assist progressing to supervision with RW and no imbalance. During standing balance exercises, she quickly fatigued and became tremulous, however did not lose her balance. Patient agreeable to HHPT. (Noted MD has ordered--Thank you)   Follow Up Recommendations  Home health PT;Supervision for mobility/OOB     Equipment Recommendations  Rolling walker with 5" wheels    Recommendations for Other Services       Precautions / Restrictions Precautions Precautions: Fall Restrictions Weight Bearing Restrictions: No    Mobility  Bed Mobility Overal bed mobility: Independent                Transfers Overall transfer level: Needs assistance Equipment used: Rolling walker (2 wheeled) Transfers: Sit to/from Stand Sit to Stand: Supervision         General transfer comment: vc for safe use of DME x1; able to recall correct technique on 2-3 transfers; no loss of balance,  Ambulation/Gait Ambulation/Gait assistance: Min guard;Supervision Ambulation Distance (Feet): 100 Feet (seated rest; 50) Assistive device: Rolling walker (2 wheeled) Gait Pattern/deviations: Step-through pattern;Decreased stride length;Wide base of support Gait velocity: decreased   General Gait Details: anticipated d/c today with focus of session on safe use of RW and balance activities; no loss of balance with RW; educated on opening doors while using RW and turning sideways to step through narrow spaces   Stairs            Wheelchair Mobility     Modified Rankin (Stroke Patients Only) Modified Rankin (Stroke Patients Only) Pre-Morbid Rankin Score: No symptoms Modified Rankin: Moderately severe disability     Balance Overall balance assessment: Needs assistance         Standing balance support: No upper extremity supported Standing balance-Leahy Scale: Fair Standing balance comment: patient becomes nervous and tremulous when hands taken off RW         Rhomberg - Eyes Opened: 30 (closeguard due to tremulous) Rhomberg - Eyes Closed: 15 (closeguard with terminated by PT due to incr sway)        Cognition Arousal/Alertness: Awake/alert Behavior During Therapy: WFL for tasks assessed/performed Overall Cognitive Status: Within Functional Limits for tasks assessed                      Exercises General Exercises - Lower Extremity Toe Raises: AAROM;Both;5 reps;Standing (heavy reliance on UEs on RW; very slow transition) Heel Raises: AAROM;Both;5 reps;Standing    General Comments        Pertinent Vitals/Pain Pain Assessment: No/denies pain    Home Living                      Prior Function            PT Goals (current goals can now be found in the care plan section) Acute Rehab PT Goals Patient Stated Goal: To return home. Time For Goal Achievement: 03/23/16 Progress towards PT goals: Progressing toward goals    Frequency    Min 4X/week  PT Plan Discharge plan needs to be updated;Equipment recommendations need to be updated    Co-evaluation             End of Session Equipment Utilized During Treatment: Gait belt Activity Tolerance: Patient limited by fatigue Patient left: with call bell/phone within reach;in bed;with bed alarm set (pt requested return to bed due to "too cold")     Time: [id number] PT Time Calculation (min) (ACUTE ONLY): 21 min  Charges:  $Gait Training: 8-22 mins                    G CodesScherrie Campbell:      [name] 03/18/2016, 10:09 AM Pager  [phone]

## 2016-03-18 NOTE — Discharge Summary (Addendum)
Physician Discharge Summary  [name] BarbaraFaith Terrio XBJ:[id number] DOB: 07/15/1940 DOA: 03/16/2016  PCP: Enrique SackGREEN, [name], MD  Admit date: 03/16/2016 Discharge date: 03/18/2016   Recommendations for Outpatient Follow-Up:   30 day event monitor (tele reviewed here and appears to have sinus arrhythmia but no a fib seen)  Discharge Diagnosis:   Active Problems:   Hyperlipidemia   CAD (coronary artery disease)   Ventricular fibrillation (HCC)   GERD   HTN (hypertension)   Chronic diastolic heart failure (HCC)   Stroke (HCC)   Stroke-like symptoms   History of placement of stent in LAD coronary artery   Right sided weakness   TIA (transient ischemic attack)   Discharge disposition:  Home.    Discharge Condition: Improved.  Diet recommendation: Low sodium, heart healthy.  Carbohydrate-modified.  Wound care: None.   History of Present Illness:   [name] is a 75 y.o. female  with multiple medical issues listed below, presenting to the emergency department with acute onset of right sided facial droop and upper extremity and right lower extremity weakness and numbness. Her last known normal was at 730 in the morning. The patient does not have a prior history of stroke. She does have a history of COPD, hypertension, CAD, hyperlipidemia, ischemic myopathy. The patient was brought here as a code stroke. She denies any fever or chills at this time, recovering from bronchitis 10 days prior , finishing Z pack  She denies any chest pain, but she did have palpitations with some dizziness this morning. She denies any nausea or vomiting. No syncope or pre-syncope. She is not on anticoagulants. She was on Plavix was on 2011 to 2012 after vfib arrest  She denies any leg swelling. No dysphagia reported, No confusion headaches, double vision, blurred vision headaches  or seizures . No hormonal replacement. No recent long distance trips. No tobacco or ETOH or recreational drugs    Hospital Course by  Problem:  Acute-sided weakness due to TIA -MRI negative MRAhead/neck unremarkable -FLP ok -HgbA1c 5.8 -echo: Left ventricle: The cavity size was normal. Wall thickness was   increased in a pattern of mild LVH. Systolic function was normal.   The estimated ejection fraction was in the range of 55% to 60%.   Wall motion was normal; there were no regional wall motion   abnormalities. Doppler parameters are consistent with abnormal   left ventricular relaxation (grade 1 diastolic dysfunction). - Mitral valve: Valve area by pressure half-time: 1.71 cm^2.  Hypertension  -resume home meds  Hyperlipidemia Continue home statins LDL: 62  COPD without exacerbation supportive therapy as needed  GERD,no acute symptoms: Continue PPI  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 Tele shows sinus rhythm  Chronic Diastolic CHF: No acute decompensation. Last 2 D echo 2013 normal EF 50-55 Gr 1 DD     Medical Consultants:    neuro   Discharge Exam:   Vitals:   03/18/16 0500 03/18/16 0955  BP: 124/74 120/70  Pulse: 63 72  Resp: 20 20  Temp: 97.5 F (36.4 C) 97.9 F (36.6 C)   Vitals:   03/17/16 2040 03/18/16 0047 03/18/16 0500 03/18/16 0955  BP: (!) 144/87 (!) 176/86 124/74 120/70  Pulse: 62 64 63 72  Resp: 18 18 20 20   Temp: 97.9 F (36.6 C) 97.8 F (36.6 C) 97.5 F (36.4 C) 97.9 F (36.6 C)  TempSrc: Oral Oral Oral Oral  SpO2: 94% 94% 95% 95%  Weight:      Height:  Gen:  NAD    The results of significant diagnostics from this hospitalization (including imaging, microbiology, ancillary and laboratory) are listed below for reference.     Procedures and Diagnostic Studies:   Dg Chest 2 View  Result Date: 03/16/2016 CLINICAL DATA:  75 year old female with right-sided weakness. Concern for stroke. EXAM: CHEST  2 VIEW COMPARISON:  Chest radiograph dated 03/10/2016 FINDINGS: Two views of the chest demonstrate emphysematous changes of the lungs. There is no  focal consolidation, pleural effusion, or pneumothorax. Stable top-normal cardiac size. There is coronary vascular calcification. The aorta is tortuous. There is osteopenia with degenerative changes of the spine. T9 compression fracture with anterior wedging as seen on the prior radiograph and CT of 03/21/2015. L1 compression fracture with vertebroplasty changes. No acute fracture. IMPRESSION: No active cardiopulmonary disease. Electronically Signed   By: Elgie Collard M.D.   On: 03/16/2016 22:12   Mr Maxine Glenn Neck W Wo Contrast  Result Date: 03/16/2016 CLINICAL DATA:  Right-sided facial droop. Right upper and lower extremity weakness. EXAM: MRI HEAD WITHOUT AND WITH CONTRAST MRA HEAD WITHOUT CONTRAST MRA NECK WITHOUT AND WITH CONTRAST TECHNIQUE: Multiplanar, multiecho pulse sequences of the brain and surrounding structures were obtained without and with intravenous contrast. Angiographic images of the Circle of Willis were obtained using MRA technique without intravenous contrast. Angiographic images of the neck were obtained using MRA technique without and with intravenous contrast. Carotid stenosis measurements (when applicable) are obtained utilizing NASCET criteria, using the distal internal carotid diameter as the denominator. CONTRAST:  16mL MULTIHANCE GADOBENATE DIMEGLUMINE 529 MG/ML IV SOLN COMPARISON:  Brain MRI 03/10/2016 FINDINGS: MRI HEAD FINDINGS Brain: The midline structures are normal. No acute infarct or intraparenchymal hemorrhage. There is multifocal hyperintense T2-weighted signal within the brainstem and the periventricular and deep white matter, most often seen in the setting of chronic microvascular ischemia. No mass lesion or midline shift. No hydrocephalus or extra-axial fluid collection. No age advanced or lobar predominant atrophy. Vascular: Major intracranial arterial and venous sinus flow voids are preserved. There are a few scattered foci of chronic microhemorrhage. Skull and upper  cervical spine: The visualized skull base, calvarium, upper cervical spine and extracranial soft tissues are normal. Sinuses/Orbits: Bilateral mastoid effusions. Normal orbits. MRA HEAD FINDINGS Intracranial internal carotid arteries: Normal. Anterior cerebral arteries: Normal. Middle cerebral arteries: Normal. Posterior communicating arteries: Present bilaterally. Posterior cerebral arteries: Bilateral fetal origins. Basilar artery: Normal. Vertebral arteries: Left dominant. Normal. Superior cerebellar arteries: Normal. Anterior inferior cerebellar arteries: Normal. Posterior inferior cerebellar arteries: Normal. MRA NECK FINDINGS No carotid or vertebral artery stenosis. IMPRESSION: 1. No acute intracranial abnormality. 2. Findings of chronic microvascular ischemia. 3. Normal MRAs of the head and neck. Electronically Signed   By: Deatra Robinson M.D.   On: 03/16/2016 22:02   Mr Laqueta Jean WU Contrast  Result Date: 03/16/2016 CLINICAL DATA:  Right-sided facial droop. Right upper and lower extremity weakness. EXAM: MRI HEAD WITHOUT AND WITH CONTRAST MRA HEAD WITHOUT CONTRAST MRA NECK WITHOUT AND WITH CONTRAST TECHNIQUE: Multiplanar, multiecho pulse sequences of the brain and surrounding structures were obtained without and with intravenous contrast. Angiographic images of the Circle of Willis were obtained using MRA technique without intravenous contrast. Angiographic images of the neck were obtained using MRA technique without and with intravenous contrast. Carotid stenosis measurements (when applicable) are obtained utilizing NASCET criteria, using the distal internal carotid diameter as the denominator. CONTRAST:  16mL MULTIHANCE GADOBENATE DIMEGLUMINE 529 MG/ML IV SOLN COMPARISON:  Brain MRI 03/10/2016 FINDINGS: MRI HEAD  FINDINGS Brain: The midline structures are normal. No acute infarct or intraparenchymal hemorrhage. There is multifocal hyperintense T2-weighted signal within the brainstem and the  periventricular and deep white matter, most often seen in the setting of chronic microvascular ischemia. No mass lesion or midline shift. No hydrocephalus or extra-axial fluid collection. No age advanced or lobar predominant atrophy. Vascular: Major intracranial arterial and venous sinus flow voids are preserved. There are a few scattered foci of chronic microhemorrhage. Skull and upper cervical spine: The visualized skull base, calvarium, upper cervical spine and extracranial soft tissues are normal. Sinuses/Orbits: Bilateral mastoid effusions. Normal orbits. MRA HEAD FINDINGS Intracranial internal carotid arteries: Normal. Anterior cerebral arteries: Normal. Middle cerebral arteries: Normal. Posterior communicating arteries: Present bilaterally. Posterior cerebral arteries: Bilateral fetal origins. Basilar artery: Normal. Vertebral arteries: Left dominant. Normal. Superior cerebellar arteries: Normal. Anterior inferior cerebellar arteries: Normal. Posterior inferior cerebellar arteries: Normal. MRA NECK FINDINGS No carotid or vertebral artery stenosis. IMPRESSION: 1. No acute intracranial abnormality. 2. Findings of chronic microvascular ischemia. 3. Normal MRAs of the head and neck. Electronically Signed   By: Deatra RobinsonKevin  Herman M.D.   On: 03/16/2016 22:02   Mr Maxine GlennMra Head/brain ZOWo Cm  Result Date: 03/16/2016 CLINICAL DATA:  Right-sided facial droop. Right upper and lower extremity weakness. EXAM: MRI HEAD WITHOUT AND WITH CONTRAST MRA HEAD WITHOUT CONTRAST MRA NECK WITHOUT AND WITH CONTRAST TECHNIQUE: Multiplanar, multiecho pulse sequences of the brain and surrounding structures were obtained without and with intravenous contrast. Angiographic images of the Circle of Willis were obtained using MRA technique without intravenous contrast. Angiographic images of the neck were obtained using MRA technique without and with intravenous contrast. Carotid stenosis measurements (when applicable) are obtained utilizing  NASCET criteria, using the distal internal carotid diameter as the denominator. CONTRAST:  16mL MULTIHANCE GADOBENATE DIMEGLUMINE 529 MG/ML IV SOLN COMPARISON:  Brain MRI 03/10/2016 FINDINGS: MRI HEAD FINDINGS Brain: The midline structures are normal. No acute infarct or intraparenchymal hemorrhage. There is multifocal hyperintense T2-weighted signal within the brainstem and the periventricular and deep white matter, most often seen in the setting of chronic microvascular ischemia. No mass lesion or midline shift. No hydrocephalus or extra-axial fluid collection. No age advanced or lobar predominant atrophy. Vascular: Major intracranial arterial and venous sinus flow voids are preserved. There are a few scattered foci of chronic microhemorrhage. Skull and upper cervical spine: The visualized skull base, calvarium, upper cervical spine and extracranial soft tissues are normal. Sinuses/Orbits: Bilateral mastoid effusions. Normal orbits. MRA HEAD FINDINGS Intracranial internal carotid arteries: Normal. Anterior cerebral arteries: Normal. Middle cerebral arteries: Normal. Posterior communicating arteries: Present bilaterally. Posterior cerebral arteries: Bilateral fetal origins. Basilar artery: Normal. Vertebral arteries: Left dominant. Normal. Superior cerebellar arteries: Normal. Anterior inferior cerebellar arteries: Normal. Posterior inferior cerebellar arteries: Normal. MRA NECK FINDINGS No carotid or vertebral artery stenosis. IMPRESSION: 1. No acute intracranial abnormality. 2. Findings of chronic microvascular ischemia. 3. Normal MRAs of the head and neck. Electronically Signed   By: Deatra RobinsonKevin  Herman M.D.   On: 03/16/2016 22:02   Ct Head Code Stroke W/o Cm  Result Date: 03/16/2016 CLINICAL DATA:  Code stroke. 75 year old female right side facial droop, right extremity weakness. Last seen normal 0730 hours. Initial encounter. EXAM: CT HEAD WITHOUT CONTRAST TECHNIQUE: Contiguous axial images were obtained from  the base of the skull through the vertex without intravenous contrast. COMPARISON:  Brain MRI and head CT 03/10/2016 FINDINGS: Brain: No midline shift, mass effect, or evidence of intracranial mass lesion. No ventriculomegaly. No acute intracranial  hemorrhage identified. Patchy bilateral cerebral white matter hypodensity is stable. Patchy hypodensity in the pons appears stable. No cortically based acute infarct identified. Incidental dystrophic basal ganglia calcifications. Vascular: Calcified atherosclerosis at the skull base. No suspicious intracranial vascular hyperdensity. Skull: No acute osseous abnormality identified. Sinuses/Orbits: Stable and well pneumatized aside from right mastoid effusion. Other: No acute orbit or scalp soft tissue finding. ASPECTS [facility] Stroke Program Early CT Score) - Ganglionic level infarction (caudate, lentiform nuclei, internal capsule, insula, M1-M3 cortex): 7 - Supraganglionic infarction (M4-M6 cortex): 3 Total score (0-10 with 10 being normal): 10 IMPRESSION: 1. No evidence of acute cortically based infarct. No acute intracranial hemorrhage. 2. ASPECTS is 10. 3. Cerebral white matter and pontine hypodensity appears stable compatible with chronic small vessel disease. 4. The above was relayed via text pager to Dr. Ritta Slot on 03/16/2016 at 12:22 . Electronically Signed   By: Odessa Fleming M.D.   On: 03/16/2016 12:23     Labs:   Basic Metabolic Panel:  Recent Labs Lab 03/16/16 1219 03/16/16 1228  NA 134* 134*  K 4.5 4.5  CL 98* 95*  CO2 28  --   GLUCOSE 100* 97  BUN 7 12  CREATININE 0.78 0.70  CALCIUM 9.0  --    GFR Estimated Creatinine Clearance: 60.3 mL/min (by C-G formula based on SCr of 0.7 mg/dL). Liver Function Tests:  Recent Labs Lab 03/16/16 1219  AST 24  ALT 18  ALKPHOS 56  BILITOT 0.7  PROT 6.6  ALBUMIN 3.6   No results for input(s): LIPASE, AMYLASE in the last 168 hours. No results for input(s): AMMONIA in the last 168  hours. Coagulation profile  Recent Labs Lab 03/16/16 1219 03/16/16 1816  INR 1.02 0.98    CBC:  Recent Labs Lab 03/16/16 1219 03/16/16 1228  WBC 8.1  --   NEUTROABS 6.2  --   HGB 13.0 13.6  HCT 37.3 40.0  MCV 98.7  --   PLT 278  --    Cardiac Enzymes:  Recent Labs Lab 03/16/16 2340  TROPONINI <0.03   BNP: Invalid input(s): POCBNP CBG:  Recent Labs Lab 03/16/16 1221  GLUCAP 99   D-Dimer No results for input(s): DDIMER in the last 72 hours. Hgb A1c  Recent Labs  03/17/16 0243  HGBA1C 5.8*   Lipid Profile  Recent Labs  03/17/16 0243  CHOL 158  HDL 82  LDLCALC 62  TRIG 72  CHOLHDL 1.9   Thyroid function studies No results for input(s): TSH, T4TOTAL, T3FREE, THYROIDAB in the last 72 hours.  Invalid input(s): FREET3 Anemia work up No results for input(s): VITAMINB12, FOLATE, FERRITIN, TIBC, IRON, RETICCTPCT in the last 72 hours. Microbiology No results found for this or any previous visit (from the past 240 hour(s)).   Discharge Instructions:   Discharge Instructions    Ambulatory referral to Neurology    Complete by:  As directed    An appointment is requested in approximately: 6 weeks-- tia   Diet - low sodium heart healthy    Complete by:  As directed    Diet Carb Modified    Complete by:  As directed    Increase activity slowly    Complete by:  As directed        Medication List    STOP taking these medications   azithromycin 250 MG tablet Commonly known as:  ZITHROMAX     TAKE these medications   aspirin 325 MG EC tablet Take 1 tablet (325 mg  total) by mouth daily. What changed:  medication strength  how much to take   calcium carbonate 600 MG Tabs tablet Commonly known as:  OS-CAL Take 600 mg by mouth every evening.   famotidine 20 MG tablet Commonly known as:  PEPCID One at bedtime   loratadine 10 MG tablet Commonly known as:  CLARITIN Take 10 mg by mouth daily as needed.   losartan 50 MG tablet Commonly  known as:  COZAAR Take 1 tablet (50 mg total) by mouth daily.   metoprolol succinate 25 MG 24 hr tablet Commonly known as:  TOPROL-XL Take 1 tablet (25 mg total) by mouth daily.   nitroGLYCERIN 0.4 MG SL tablet Commonly known as:  NITROSTAT Place 1 tablet (0.4 mg total) under the tongue every 5 (five) minutes as needed. For chest pain   pantoprazole 40 MG tablet Commonly known as:  PROTONIX TAKE 1 TABLET DAILY   RECLAST IV Inject into the vein. Once Yearly   rosuvastatin 20 MG tablet Commonly known as:  CRESTOR Take 1 tablet (20 mg total) by mouth at bedtime.   traMADol 50 MG tablet Commonly known as:  ULTRAM Take 50 mg by mouth every 6 (six) hours as needed.   Vitamin D3 1000 units Caps Take 1,000 Units by mouth daily.            Durable Medical Equipment        Start     Ordered   03/17/16 1230  For home use only DME Walker rolling  Once    Question:  Patient needs a walker to treat with the following condition  Answer:  Weakness   03/17/16 1229     Follow-up Information    GREEN, [name], MD.   Specialty:  Internal Medicine Contact information: [address] [place] [address] [phone]            Time coordinating discharge: 35 min  Signed:  Nichola Cieslinski U Vina Byrd   [facility] 03/18/2016, 2:43 PM

## 2016-04-18 ENCOUNTER — Other Ambulatory Visit: Payer: Self-pay | Admitting: Cardiology

## 2016-04-26 ENCOUNTER — Emergency Department (HOSPITAL_COMMUNITY)
Admission: EM | Admit: 2016-04-26 | Discharge: 2016-04-26 | Disposition: A | Discharge status: Home / Self Care | Payer: Medicare Other | Attending: Emergency Medicine | Admitting: Emergency Medicine

## 2016-04-26 ENCOUNTER — Encounter (HOSPITAL_COMMUNITY): Payer: Self-pay | Admitting: Emergency Medicine

## 2016-04-26 DIAGNOSIS — J449 Chronic obstructive pulmonary disease, unspecified: Secondary | ICD-10-CM | POA: Insufficient documentation

## 2016-04-26 DIAGNOSIS — Z8673 Personal history of transient ischemic attack (TIA), and cerebral infarction without residual deficits: Secondary | ICD-10-CM | POA: Diagnosis not present

## 2016-04-26 DIAGNOSIS — Z955 Presence of coronary angioplasty implant and graft: Secondary | ICD-10-CM | POA: Insufficient documentation

## 2016-04-26 DIAGNOSIS — Z79899 Other long term (current) drug therapy: Secondary | ICD-10-CM | POA: Insufficient documentation

## 2016-04-26 DIAGNOSIS — I10 Essential (primary) hypertension: Secondary | ICD-10-CM | POA: Diagnosis not present

## 2016-04-26 DIAGNOSIS — I2510 Atherosclerotic heart disease of native coronary artery without angina pectoris: Secondary | ICD-10-CM | POA: Insufficient documentation

## 2016-04-26 DIAGNOSIS — Z87891 Personal history of nicotine dependence: Secondary | ICD-10-CM | POA: Insufficient documentation

## 2016-04-26 DIAGNOSIS — Z7982 Long term (current) use of aspirin: Secondary | ICD-10-CM | POA: Diagnosis not present

## 2016-04-26 DIAGNOSIS — B029 Zoster without complications: Secondary | ICD-10-CM | POA: Diagnosis not present

## 2016-04-26 MED ORDER — HYDROCODONE-ACETAMINOPHEN 5-325 MG PO TABS: 1.0000 | ORAL_TABLET | Freq: Four times a day (QID) | ORAL | 0 refills | Status: DC | PRN

## 2016-04-26 MED ORDER — FLUORESCEIN SODIUM 0.6 MG OP STRP
1.0000 | ORAL_STRIP | Freq: Once | OPHTHALMIC | Status: AC
  Administered 2016-04-26: 1 via OPHTHALMIC
  Filled 2016-04-26: qty 1

## 2016-04-26 MED ORDER — TETRACAINE HCL 0.5 % OP SOLN
2.0000 [drp] | Freq: Once | OPHTHALMIC | Status: AC
  Administered 2016-04-26: 2 [drp] via OPHTHALMIC
  Filled 2016-04-26: qty 2

## 2016-04-26 MED ORDER — PREDNISONE 10 MG PO TABS: 20.0000 mg | ORAL_TABLET | Freq: Two times a day (BID) | ORAL | 0 refills | Status: DC

## 2016-04-26 NOTE — ED Notes (Signed)
Pt ambulated to room from waiting room, tolerated well. 

## 2016-04-26 NOTE — ED Provider Notes (Signed)
[facility]-EMERGENCY DEPT Provider Note   CSN: [id number] Arrival date & time: 04/26/16  4098     History   Chief Complaint Chief Complaint  Patient presents with  . Herpes Zoster    HPI [name] is a 76 y.o. female.  Patient is a 76 year old female with history of COPD, coronary artery disease, hypertension. She was diagnosed yesterday with a shingles to her left forehead. She was started on acyclovir and has taken 5 doses of this. Today the rash has extended to her eyelid and it is swollen and painful. She denies any pain of the eyeball itself. She denies any visual disturbances. She was told to come to the ER if her symptoms worsen. She denies any fevers or chills. She denies any ill contacts.   The history is provided by the patient.    Past Medical History:  Diagnosis Date  . Compression fracture    T9  . COPD (chronic obstructive pulmonary disease) (HCC)   . Coronary artery disease 08/14/2009   a. 08/2009: Ant MI c/b v-fib arrest, s/p PTCA/BMS to LAD. (STENT Placement)  . GERD (gastroesophageal reflux disease)   . HTN (hypertension)   . Hyperlipidemia   . Ischemic cardiomyopathy    a. EF 40-45% in 08/2009 at time of MI. b. Improved to 55-60% by June 2011.  . Osteoarthritis   . Osteopenia   . PONV (postoperative nausea and vomiting)   . Postoperative groin pseudoaneurysm (HCC)    a. 04/2010 following diagnostic cardiac cath, s/p compression.  . Rib fractures    left sided second and sixth rib   . Ventricular fibrillation (HCC)    a. 08/2009: due to anterior MI.    Patient Active Problem List   Diagnosis Date Noted  . TIA (transient ischemic attack) 03/18/2016  . Right sided weakness   . Stroke (HCC) 03/16/2016  . Stroke-like symptoms   . History of placement of stent in LAD coronary artery   . Dyspnea 08/08/2015  . Preoperative clearance 05/09/2015  . Atypical chest pain 03/21/2015  . Acute UTI 03/21/2015  . Hydronephrosis, right 03/21/2015  . Proteinuria  03/21/2015  . HTN (hypertension) 03/21/2015  . Chronic diastolic heart failure (HCC) 03/21/2015  . 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 Unstable angina (HCC) 11/16/2012  . Palpitations 07/22/2010  . COPD GOLD I  05/30/2010  . Hyperlipidemia 08/27/2009  . AMI 08/27/2009  . CAD (coronary artery disease) 08/27/2009  . Ventricular fibrillation (HCC) 08/14/2009  . COMPRESSION FRACTURE, LUMBAR VERTEBRAE 12/21/2008  . SPINAL STENOSIS, LUMBAR 08/31/2008  . Macrocytic anemia 08/30/2008  . ABDOMINAL MASS 07/11/2008  . UNSPECIFIED VITAMIN D DEFICIENCY 03/09/2008  . GERD 03/09/2008  . OSTEOARTHRITIS 11/21/2007  . OSTEOPENIA 11/21/2007    Past Surgical History:  Procedure Laterality Date  . APPENDECTOMY    . BACK SURGERY    . LEFT HEART CATHETERIZATION WITH CORONARY ANGIOGRAM N/A 11/16/2012   Procedure: LEFT HEART CATHETERIZATION WITH CORONARY ANGIOGRAM;  Surgeon: Kathleene Hazel, MD;  Location: [facility] CATH LAB;  Service: Cardiovascular;  Laterality: N/A;  . LEG SURGERY     R low  . ORIF HIP FRACTURE    . TONSILLECTOMY      OB History    No data available       Home Medications    Prior to Admission medications   Medication Sig Start Date End Date Taking? Authorizing Provider  aspirin EC 325 MG EC tablet Take 1 tablet (325 mg total) by mouth daily. 03/18/16  Joseph ArtJessica U Vann, DO  calcium carbonate (OS-CAL) 600 MG TABS tablet Take 600 mg by mouth every evening.     Historical Provider, MD  Cholecalciferol (VITAMIN D3) 1000 units CAPS Take 1,000 Units by mouth daily.    Historical Provider, MD  famotidine (PEPCID) 20 MG tablet One at bedtime 08/07/15   Nyoka CowdenMichael B Wert, MD  loratadine (CLARITIN) 10 MG tablet Take 10 mg by mouth daily as needed.     Historical Provider, MD  losartan (COZAAR) 50 MG tablet Take 1 tablet (50 mg total) by mouth daily. 11/17/12   Dayna N Dunn, PA-C  metoprolol succinate (TOPROL-XL) 25 MG 24 hr tablet TAKE 1 TABLET DAILY 04/20/16   Peter M  Swazil[place], MD  nitroGLYCERIN (NITROSTAT) 0.4 MG SL tablet Place 1 tablet (0.4 mg total) under the tongue every 5 (five) minutes as needed. For chest pain 08/22/15   Peter M [place], MD  pantoprazole (PROTONIX) 40 MG tablet TAKE 1 TABLET DAILY 02/10/16   Peter M [place], MD  rosuvastatin (CRESTOR) 20 MG tablet Take 1 tablet (20 mg total) by mouth at bedtime. 01/27/16   Peter M [place], MD  traMADol (ULTRAM) 50 MG tablet Take 50 mg by mouth every 6 (six) hours as needed.    Historical Provider, MD  Zoledronic Acid (RECLAST IV) Inject into the vein. Once Yearly    Historical Provider, MD    Family History Family History  Problem Relation Age of Onset  . Rheum arthritis Sister     Social History Social History  Substance Use Topics  . Smoking status: Former Smoker    Packs/day: 2.00    Years: 20.00    Types: Cigarettes    Quit date: 04/06/1984  . Smokeless tobacco: Never Used  . Alcohol use 0.0 oz/week     Comment: No history of alcohol abuse     Allergies   Codeine; Imdur [isosorbide dinitrate]; and Sulfonamide derivatives   Review of Systems Review of Systems  All other systems reviewed and are negative.    Physical Exam Updated Vital Signs BP 137/89 (BP Location: Right Arm)   Pulse 79   Temp 98 F (36.7 C) (Oral)   Resp 19   Ht 5' 5.5" (1.664 m)   Wt 160 lb (72.6 kg)   SpO2 96%   BMI 26.22 kg/m   Physical Exam  Constitutional: She is oriented to person, place, and time. She appears well-developed and well-nourished. No distress.  HENT:  Head: Atraumatic.  There is a rash noted to the left temple, forehead, extending to the left eyelid.  Eyes: EOM are normal. Pupils are equal, round, and reactive to light.  There is no conjunctival injection and with gross inspection, the cornea appears clear.  Cardiovascular: Normal rate.   Pulmonary/Chest: Effort normal.  Neurological: She is alert and oriented to person, place, and time.  Skin: Skin is warm and dry. She is not  diaphoretic.  Nursing note and vitals reviewed.    ED Treatments / Results  Labs (all labs ordered are listed, but only abnormal results are displayed) Labs Reviewed - No data to display  EKG  EKG Interpretation None       Radiology No results found.  Procedures Procedures (including critical care time)  Medications Ordered in ED Medications  fluorescein ophthalmic strip 1 strip (not administered)  tetracaine (PONTOCAINE) 0.5 % ophthalmic solution 2 drop (not administered)     Initial Impression / Assessment and Plan / ED Course  I have reviewed the  triage vital signs and the nursing notes.  Pertinent labs & imaging results that were available during my care of the patient were reviewed by me and considered in my medical decision making (see chart for details).     Patient presents with rash to forehead. She was diagnosed with shingles yesterday. This rash is extended down to her left eyelid. She denies any eye pain and floor seen staining today reveals no evidence for dendritic lesions or corneal involvement. She is currently taking acyclovir and I will add prednisone and a pain medication. I see no indication for hospitalization or further workup. She is to return as needed for any problems.  Final Clinical Impressions(s) / ED Diagnoses   Final diagnoses:  None    New Prescriptions New Prescriptions   No medications on file     Geoffery Lyons, MD 04/26/16 1119

## 2016-04-26 NOTE — Discharge Instructions (Signed)
Prednisone as prescribed.  Hydrocodone as prescribed as needed for pain.  Return to the emergency department if you develop eye pain, visual disturbances, high fevers, severe headache, confusion, or other new and concerning symptoms.

## 2016-04-26 NOTE — ED Triage Notes (Signed)
[facility] was only on scalp -- now has swelling around left eye -- started on acyclovir yesterday.

## 2016-04-26 NOTE — ED Notes (Signed)
MD at bedside. 

## 2016-04-30 ENCOUNTER — Encounter (HOSPITAL_COMMUNITY): Payer: Self-pay | Admitting: Emergency Medicine

## 2016-04-30 ENCOUNTER — Ambulatory Visit (HOSPITAL_COMMUNITY)
Admission: EM | Admit: 2016-04-30 | Discharge: 2016-04-30 | Disposition: A | Discharge status: Home / Self Care | Payer: Medicare Other | Attending: Family Medicine | Admitting: Family Medicine

## 2016-04-30 DIAGNOSIS — B029 Zoster without complications: Secondary | ICD-10-CM

## 2016-04-30 NOTE — ED Provider Notes (Signed)
CSN: [id number]     Arrival date & time 04/30/16  1306 History   None    Chief Complaint  Patient presents with  . Herpes Zoster   (Consider location/radiation/quality/duration/timing/severity/associated sxs/prior Treatment) 76 year old female presents to clinic for evaluation of her herpes zoster. She was seen in the ER one week ago and started on acyclovir and prednisone, she was referred to ophthalmology who confirmed there was no corneal involvement and was prescribed topical antibiotics for preventative measures. She was prescribed hydrocodone for pain relief but has preferred not to take them, taking her tramadol instead. She states the purpose of her visit is today is for reevaluation and to see if she needs refills of her medications.   The history is provided by the patient.    Past Medical History:  Diagnosis Date  . Compression fracture    T9  . COPD (chronic obstructive pulmonary disease) (HCC)   . Coronary artery disease 08/14/2009   a. 08/2009: Ant MI c/b v-fib arrest, s/p PTCA/BMS to LAD. (STENT Placement)  . GERD (gastroesophageal reflux disease)   . HTN (hypertension)   . Hyperlipidemia   . Ischemic cardiomyopathy    a. EF 40-45% in 08/2009 at time of MI. b. Improved to 55-60% by June 2011.  . Osteoarthritis   . Osteopenia   . PONV (postoperative nausea and vomiting)   . Postoperative groin pseudoaneurysm (HCC)    a. 04/2010 following diagnostic cardiac cath, s/p compression.  . Rib fractures    left sided second and sixth rib   . Ventricular fibrillation (HCC)    a. 08/2009: due to anterior MI.   Past Surgical History:  Procedure Laterality Date  . APPENDECTOMY    . BACK SURGERY    . LEFT HEART CATHETERIZATION WITH CORONARY ANGIOGRAM N/A 11/16/2012   Procedure: LEFT HEART CATHETERIZATION WITH CORONARY ANGIOGRAM;  Surgeon: Kathleene Hazelhristopher D McAlhany, MD;  Location: [facility] CATH LAB;  Service: Cardiovascular;  Laterality: N/A;  . LEG SURGERY     R low  . ORIF HIP  FRACTURE    . TONSILLECTOMY     Family History  Problem Relation Age of Onset  . Rheum arthritis Sister    Social History  Substance Use Topics  . Smoking status: Former Smoker    Packs/day: 2.00    Years: 20.00    Types: Cigarettes    Quit date: 04/06/1984  . Smokeless tobacco: Never Used  . Alcohol use 0.0 oz/week     Comment: No history of alcohol abuse   OB History    No data available     Review of Systems  Reason unable to perform ROS: as covered in HPI.  All other systems reviewed and are negative.   Allergies  Codeine; Imdur [isosorbide dinitrate]; and Sulfonamide derivatives  Home Medications   Prior to Admission medications   Medication Sig Start Date End Date Taking? Authorizing Provider  aspirin EC 325 MG EC tablet Take 1 tablet (325 mg total) by mouth daily. 03/18/16  Yes Joseph ArtJessica U Vann, DO  calcium carbonate (OS-CAL) 600 MG TABS tablet Take 600 mg by mouth every evening.    Yes Historical Provider, MD  Cholecalciferol (VITAMIN D3) 1000 units CAPS Take 1,000 Units by mouth daily.   Yes Historical Provider, MD  losartan (COZAAR) 50 MG tablet Take 1 tablet (50 mg total) by mouth daily. 11/17/12  Yes Dayna N Dunn, PA-C  metoprolol succinate (TOPROL-XL) 25 MG 24 hr tablet TAKE 1 TABLET DAILY 04/20/16  Yes  Peter M [place], MD  pantoprazole (PROTONIX) 40 MG tablet TAKE 1 TABLET DAILY 02/10/16  Yes Peter M [place], MD  predniSONE (DELTASONE) 10 MG tablet Take 2 tablets (20 mg total) by mouth 2 (two) times daily with a meal. 04/26/16  Yes Geoffery Lyons, MD  rosuvastatin (CRESTOR) 20 MG tablet Take 1 tablet (20 mg total) by mouth at bedtime. 01/27/16  Yes Peter M [place], MD  traMADol (ULTRAM) 50 MG tablet Take 50 mg by mouth every 6 (six) hours as needed.   Yes Historical Provider, MD  famotidine (PEPCID) 20 MG tablet One at bedtime 08/07/15   Nyoka Cowden, MD  HYDROcodone-acetaminophen (NORCO) 5-325 MG tablet Take 1-2 tablets by mouth every 6 (six) hours as needed. 04/26/16    Geoffery Lyons, MD  loratadine (CLARITIN) 10 MG tablet Take 10 mg by mouth daily as needed.     Historical Provider, MD  nitroGLYCERIN (NITROSTAT) 0.4 MG SL tablet Place 1 tablet (0.4 mg total) under the tongue every 5 (five) minutes as needed. For chest pain 08/22/15   Peter M [place], MD  Zoledronic Acid (RECLAST IV) Inject into the vein. Once Yearly    Historical Provider, MD   Meds Ordered and Administered this Visit  Medications - No data to display  BP 144/80 (BP Location: Left Arm)   Pulse 75   Temp 98 F (36.7 C) (Oral)   Resp 20   SpO2 97%  No data found.   Physical Exam  Constitutional: She appears well-developed and well-nourished. No distress.  HENT:  Head: Normocephalic and atraumatic.    Right Ear: External ear normal.  Left Ear: External ear normal.  Mouth/Throat: Oropharynx is clear and moist.  Eyes: Conjunctivae and EOM are normal. Pupils are equal, round, and reactive to light. Right eye exhibits no discharge. Left eye exhibits no discharge.  Neck: Normal range of motion. Neck supple. No JVD present.  Cardiovascular: Normal rate and regular rhythm.   Pulmonary/Chest: Effort normal and breath sounds normal.  Abdominal: Soft. Bowel sounds are normal.  Lymphadenopathy:    She has no cervical adenopathy.  Neurological: She is alert. A cranial nerve deficit is present.  Skin: Skin is warm and dry. Capillary refill takes less than 2 seconds. She is not diaphoretic.  Psychiatric: She has a normal mood and affect.  Nursing note and vitals reviewed.   Urgent Care Course     Procedures (including critical care time)  Labs Review Labs Reviewed - No data to display  Imaging Review No results found.   Visual Acuity Review  Right Eye Distance:   Left Eye Distance:   Bilateral Distance:    Right Eye Near:   Left Eye Near:    Bilateral Near:         MDM   1. Herpes zoster without complication   Your herpes zoster appears to be healing, no additional  medication or changes in therapy appear to be warranted at this time. Should your symptoms worsen or if you have any changes in vision, follow up with your primary care provider, return to clinic, or go to the emergency room.      Dorena Bodo, NP 04/30/16 1435

## 2016-04-30 NOTE — ED Triage Notes (Signed)
Here for rash/shingles onset 7 days  Has been to mult doctors office, ophthalmologist and [facility]  ER visit   Has been started on acyclovir and prednisone w/temp relief.   Reports it's getting better   A&O x4... NAD

## 2016-04-30 NOTE — Discharge Instructions (Signed)
Your herpes zoster appears to be healing, no additional medication or changes in therapy appear to be warranted at this time. Should your symptoms worsen or if you have any changes in vision, follow up with your primary care provider, return to clinic, or go to the emergency room.

## 2016-05-25 ENCOUNTER — Other Ambulatory Visit: Payer: Self-pay | Admitting: Internal Medicine

## 2016-05-25 DIAGNOSIS — R1011 Right upper quadrant pain: Secondary | ICD-10-CM

## 2016-06-01 ENCOUNTER — Ambulatory Visit
Admission: RE | Admit: 2016-06-01 | Discharge: 2016-06-01 | Disposition: A | Discharge status: Home / Self Care | Payer: Medicare Other | Source: Ambulatory Visit | Attending: Internal Medicine | Admitting: Internal Medicine

## 2016-06-01 DIAGNOSIS — R1011 Right upper quadrant pain: Secondary | ICD-10-CM

## 2016-06-30 ENCOUNTER — Other Ambulatory Visit: Payer: Self-pay | Admitting: Internal Medicine

## 2016-06-30 ENCOUNTER — Ambulatory Visit
Admission: RE | Admit: 2016-06-30 | Discharge: 2016-06-30 | Disposition: A | Discharge status: Home / Self Care | Payer: Medicare Other | Source: Ambulatory Visit | Attending: Internal Medicine | Admitting: Internal Medicine

## 2016-06-30 DIAGNOSIS — R0602 Shortness of breath: Secondary | ICD-10-CM

## 2016-07-08 ENCOUNTER — Encounter: Payer: Self-pay | Admitting: Nurse Practitioner

## 2016-07-08 ENCOUNTER — Ambulatory Visit (INDEPENDENT_AMBULATORY_CARE_PROVIDER_SITE_OTHER): Payer: Medicare Other | Admitting: Nurse Practitioner

## 2016-07-08 VITALS — BP 118/76 | HR 79 | Ht 65.0 in | Wt 163.0 lb

## 2016-07-08 DIAGNOSIS — I10 Essential (primary) hypertension: Secondary | ICD-10-CM | POA: Diagnosis not present

## 2016-07-08 DIAGNOSIS — I2510 Atherosclerotic heart disease of native coronary artery without angina pectoris: Secondary | ICD-10-CM | POA: Diagnosis not present

## 2016-07-08 DIAGNOSIS — R0609 Other forms of dyspnea: Secondary | ICD-10-CM

## 2016-07-08 DIAGNOSIS — E785 Hyperlipidemia, unspecified: Secondary | ICD-10-CM

## 2016-07-08 NOTE — Patient Instructions (Signed)
Medication Instructions:   NO CHANGE  Testing/Procedures:  Your physician has requested that you have a lexiscan myoview. For further information please visit [email or web address] Please follow instruction sheet, as given.    Follow-Up:  Your physician recommends that you schedule a follow-up appointment in: ONE MONTH WITH CHRIS BERGE OR DR [place]   If you need a refill on your cardiac medications before your next appointment, please call your pharmacy.

## 2016-07-08 NOTE — Progress Notes (Signed)
Office Visit    Patient Name: [name] Date of Encounter: 07/08/2016  Primary Care Provider:  Lorenda Peck, MD Primary Cardiologist:  P. [place], MD   Chief Complaint    76 year old female with a prior history of coronary artery disease status post anterior MI and LAD bare-metal stenting complicated by VF arrest in 2011, hypertension, hyperlipidemia, GERD, TIA, and recent bout of shingles, who presents for follow-up in the setting of a 3 month history of dyspnea on exertion.  Past Medical History    Past Medical History:  Diagnosis Date  . Compression fracture    T9  . COPD (chronic obstructive pulmonary disease) (HCC)   . Coronary artery disease 08/14/2009   a. 08/2009: Ant MI c/b v-fib arrest, s/p PTCA/BMS to LAD. (STENT Placement); b. 2013 Myoview: no ischemia; c. 11/2012 Cath: patent mLAD stent, RI 70 (stable), otw nonobs dzs, EF 65%.  Marland Kitchen GERD (gastroesophageal reflux disease)   . HTN (hypertension)   . Hyperlipidemia   . Ischemic cardiomyopathy    a. EF 40-45% in 08/2009 at time of MI. b. Improved to 55-60% by June 2011; c. 03/2016 Echo: EF 55-60%, no rwma, Gr1 DD, mild LVH.  . Osteoarthritis   . Osteopenia   . PONV (postoperative nausea and vomiting)   . Postoperative groin pseudoaneurysm (HCC)    a. 04/2010 following diagnostic cardiac cath, s/p compression.  . Rib fractures    left sided second and sixth rib   . Shingles   . TIA (transient ischemic attack)    a. 03/2016 MRI/MRA negative/unremarkable.  . Ventricular fibrillation (HCC)    a. 08/2009: due to anterior MI.   Past Surgical History:  Procedure Laterality Date  . APPENDECTOMY    . BACK SURGERY    . LEFT HEART CATHETERIZATION WITH CORONARY ANGIOGRAM N/A 11/16/2012   Procedure: LEFT HEART CATHETERIZATION WITH CORONARY ANGIOGRAM;  Surgeon: Kathleene Hazel, MD;  Location: [facility] CATH LAB;  Service: Cardiovascular;  Laterality: N/A;  . LEG SURGERY     R low  . ORIF HIP FRACTURE    . TONSILLECTOMY       Allergies  Allergies  Allergen Reactions  . Codeine Nausea And Vomiting  . Imdur [Isosorbide Dinitrate] Nausea Only    Headache  . Sulfonamide Derivatives Nausea And Vomiting    History of Present Illness    76 year old female with the above complex past medical history. She is status post anterior MI and VF arrest in May 2011 with bare-metal stenting of the LAD. EF was 40-45% at that time but subsequently recovered. Last stress test was in 2013 and was nonischemic. Her last catheterization took place in August 2014 which revealed stable 70% stenosis within the ramus intermedius and a patent LAD stent. EF was 65% at that time. Other history includes hypertension and hyperlipidemia.   In December 2017, she was admitted with strokelike symptoms and MRI/MRA was unrevealing. Symptoms resolved and ultimately, it was felt that this represented a TIA. She had no arrhythmias on telemetry.  In January, she was diagnosed with shingles affecting her forehead, scalp, and left eye. This has been managed though she still has some residual discomfort over her left forehead area. For a brief period of time, she was taking hydrocodone but this caused significant nausea and constipation. In that setting, she came off of all of her medications with the exception of Protonix. She has been much less active since the onset of shingles. Since then, she has noticed dyspnea on  exertion without change in weight. She has not had any chest pain and also denies palpitations, PND, orthopnea, dizziness, syncope, edema, or early satiety.   Home Medications    Prior to Admission medications   Medication Sig Start Date End Date Taking? Authorizing Provider  aspirin EC 325 MG EC tablet Take 1 tablet (325 mg total) by mouth daily. 03/18/16  Yes Joseph Art, DO  calcium carbonate (OS-CAL) 600 MG TABS tablet Take 600 mg by mouth every evening.    Yes Historical Provider, MD  Cholecalciferol (VITAMIN D3) 1000 units CAPS  Take 1,000 Units by mouth daily.   Yes Historical Provider, MD  loratadine (CLARITIN) 10 MG tablet Take 10 mg by mouth daily as needed.    Yes Historical Provider, MD  losartan (COZAAR) 50 MG tablet Take 1 tablet (50 mg total) by mouth daily. 11/17/12  Yes Dayna N Dunn, PA-C  metoprolol succinate (TOPROL-XL) 25 MG 24 hr tablet TAKE 1 TABLET DAILY 04/20/16  Yes Peter M [place], MD  nitroGLYCERIN (NITROSTAT) 0.4 MG SL tablet Place 1 tablet (0.4 mg total) under the tongue every 5 (five) minutes as needed. For chest pain 08/22/15  Yes Peter M [place], MD  pantoprazole (PROTONIX) 40 MG tablet TAKE 1 TABLET DAILY 02/10/16  Yes Peter M [place], MD  rosuvastatin (CRESTOR) 20 MG tablet Take 1 tablet (20 mg total) by mouth at bedtime. 01/27/16  Yes Peter M [place], MD  traMADol (ULTRAM) 50 MG tablet Take 50 mg by mouth every 6 (six) hours as needed.   Yes Historical Provider, MD  Zoledronic Acid (RECLAST IV) Inject into the vein. Once Yearly   Yes Historical Provider, MD    Review of Systems    As above, she has been expressing dyspnea on exertion over the past 3 months. She also has residual left scalp and forehead pain in the setting of shingles. She denies chest pain, palpitations, PND, orthopnea, dizziness, syncope, edema, or early satiety.  All other systems reviewed and are otherwise negative except as noted above.  Physical Exam    VS:  BP 118/76   Pulse 79   Ht  (1.651 m)   Wt 163 lb (73.9 kg)   BMI 27.12 kg/m  , BMI Body mass index is 27.12 kg/m. GEN: Well nourished, well developed, in no acute distress.  HEENT: normal.  Neck: Supple, no JVD, carotid bruits, or masses. Cardiac: RRR, no murmurs, rubs, or gallops. No clubbing, cyanosis, edema.  Radials/DP/PT 2+ and equal bilaterally.  Respiratory:  Respirations regular and unlabored, clear to auscultation bilaterally. GI: Soft, nontender, nondistended, BS + x 4. MS: no deformity or atrophy. Skin: warm and dry, no rash. Neuro:  Strength and  sensation are intact. Psych: Normal affect.  Accessory Clinical Findings    ECG - Regular sinus rhythm, 79, left axis deviation, old anteroseptal infarct, no acute ST or T changes.  Assessment & Plan    1.  Dyspnea on exertion/coronary artery disease: Patient presents with a three-month history of dyspnea on exertion in the absence of chest pain. She has not had any significant weight gain or volume overload and is euvolemic on exam today. Echocardiogram performed in December in the setting of TIA symptoms, showed normal LV function. Heart rate and blood pressure well controlled. I will arrange for a Lexiscan Myoview to rule out ischemia as dyspnea may represent an anginal equivalent. She recently stopped taking most of her medications because of nausea in the setting of hydrocodone therapy for shingles.  This has since cleared. I recommended that she resume taking aspirin, beta blocker, and statin therapy.  2. Essential hypertension: Stable.  3. Hyperlipidemia: She has not been taking her Crestor because of nausea that has since resolved. I've asked her to resume her Crestor.  4. Shingles: She continues to have some pain over her left forehead but overall, things have improved. This is followed by primary care. She is no longer requiring pain medication.  5. Disposition: Follow-up stress testing. Follow-up in clinic in one month or sooner if necessary.   Nicolasa Ducking, NP 07/08/2016, 1:29 PM

## 2016-07-09 ENCOUNTER — Telehealth (HOSPITAL_COMMUNITY): Payer: Self-pay

## 2016-07-09 NOTE — Telephone Encounter (Signed)
Encounter complete. 

## 2016-07-10 ENCOUNTER — Ambulatory Visit (HOSPITAL_COMMUNITY)
Admission: RE | Admit: 2016-07-10 | Discharge: 2016-07-10 | Disposition: A | Discharge status: Home / Self Care | Payer: Medicare Other | Source: Ambulatory Visit | Attending: Cardiology | Admitting: Cardiology

## 2016-07-10 DIAGNOSIS — I2510 Atherosclerotic heart disease of native coronary artery without angina pectoris: Secondary | ICD-10-CM | POA: Insufficient documentation

## 2016-07-10 DIAGNOSIS — R0609 Other forms of dyspnea: Secondary | ICD-10-CM | POA: Insufficient documentation

## 2016-07-10 LAB — MYOCARDIAL PERFUSION IMAGING
LV dias vol: 69 mL (ref 46–106)
LV sys vol: 31 mL
Peak HR: 80 {beats}/min
Rest HR: 54 {beats}/min
SDS: 1
SRS: 2
SSS: 3
TID: 1.12

## 2016-07-10 MED ORDER — REGADENOSON 0.4 MG/5ML IV SOLN
0.4000 mg | Freq: Once | INTRAVENOUS | Status: AC
  Administered 2016-07-10: 0.4 mg via INTRAVENOUS

## 2016-07-10 MED ORDER — AMINOPHYLLINE 25 MG/ML IV SOLN
75.0000 mg | Freq: Once | INTRAVENOUS | Status: AC
  Administered 2016-07-10: 75 mg via INTRAVENOUS

## 2016-07-10 MED ORDER — TECHNETIUM TC 99M TETROFOSMIN IV KIT
10.8000 | PACK | Freq: Once | INTRAVENOUS | Status: AC | PRN
  Administered 2016-07-10: 10.8 via INTRAVENOUS
  Filled 2016-07-10: qty 11

## 2016-07-10 MED ORDER — TECHNETIUM TC 99M TETROFOSMIN IV KIT
31.0000 | PACK | Freq: Once | INTRAVENOUS | Status: AC | PRN
  Administered 2016-07-10: 31 via INTRAVENOUS
  Filled 2016-07-10: qty 31

## 2016-08-05 ENCOUNTER — Encounter: Payer: Self-pay | Admitting: Nurse Practitioner

## 2016-08-05 ENCOUNTER — Ambulatory Visit (INDEPENDENT_AMBULATORY_CARE_PROVIDER_SITE_OTHER): Payer: Medicare Other | Admitting: Nurse Practitioner

## 2016-08-05 VITALS — BP 112/70 | HR 56 | Ht 65.0 in | Wt 162.4 lb

## 2016-08-05 DIAGNOSIS — I2510 Atherosclerotic heart disease of native coronary artery without angina pectoris: Secondary | ICD-10-CM

## 2016-08-05 DIAGNOSIS — E785 Hyperlipidemia, unspecified: Secondary | ICD-10-CM

## 2016-08-05 DIAGNOSIS — I10 Essential (primary) hypertension: Secondary | ICD-10-CM | POA: Diagnosis not present

## 2016-08-05 NOTE — Progress Notes (Signed)
Office Visit    Patient Name: [name] Date of Encounter: 08/05/2016  Primary Care Provider:  Lorenda Peck, MD Primary Cardiologist:  P. [place], MD   Chief Complaint    76 year old female with a prior history of coronary artery disease status post anterior MI and LAD bare-metal stenting complicated by VF arrest in 2011, hypertension, hyperlipidemia, GERD, TIA, and recent bout of shingles, who presents for follow-up related to recent DOE and stress test.  Past Medical History    Past Medical History:  Diagnosis Date  . Compression fracture    T9  . COPD (chronic obstructive pulmonary disease) (HCC)   . Coronary artery disease 08/14/2009   a. 08/2009: Ant MI c/b v-fib arrest, s/p PTCA/BMS to LAD. (STENT Placement); b. 2013 Myoview: no ischemia; c. 11/2012 Cath: patent mLAD stent, RI 70 (stable), otw nonobs dzs, EF 65%;  d. 07/2016 Lexiscan MV: EF 56%, no ischemia/infarct.  Marland Kitchen GERD (gastroesophageal reflux disease)   . HTN (hypertension)   . Hyperlipidemia   . Ischemic cardiomyopathy    a. EF 40-45% in 08/2009 at time of MI. b. Improved to 55-60% by June 2011; c. 03/2016 Echo: EF 55-60%, no rwma, Gr1 DD, mild LVH.  . Osteoarthritis   . Osteopenia   . PONV (postoperative nausea and vomiting)   . Postoperative groin pseudoaneurysm (HCC)    a. 04/2010 following diagnostic cardiac cath, s/p compression.  . Rib fractures    left sided second and sixth rib   . Shingles   . TIA (transient ischemic attack)    a. 03/2016 MRI/MRA negative/unremarkable.  . Ventricular fibrillation (HCC)    a. 08/2009: due to anterior MI.   Past Surgical History:  Procedure Laterality Date  . APPENDECTOMY    . BACK SURGERY    . LEFT HEART CATHETERIZATION WITH CORONARY ANGIOGRAM N/A 11/16/2012   Procedure: LEFT HEART CATHETERIZATION WITH CORONARY ANGIOGRAM;  Surgeon: Kathleene Hazel, MD;  Location: [facility] CATH LAB;  Service: Cardiovascular;  Laterality: N/A;  . LEG SURGERY     R low  . ORIF  HIP FRACTURE    . TONSILLECTOMY      Allergies  Allergies  Allergen Reactions  . Codeine Nausea And Vomiting  . Imdur [Isosorbide Dinitrate] Nausea Only    Headache Headache  . Other Nausea And Vomiting  . Sulfonamide Derivatives Nausea And Vomiting    History of Present Illness    76 year old female with the above complex past medical history. She is status post anterior MI and VF arrest in May 2011 with bare-metal stenting of the LAD. EF was 40-45% at that time but subsequently recovered. Last stress test was in 2013 and was nonischemic. Her last catheterization took place in August 2014 which revealed stable 70% stenosis within the ramus intermedius and a patent LAD stent. EF was 65% at that time. Other history includes hypertension, HL, ICM with subsequent Normalization of LV function, TIA in December 2017, and recent bout of shingles.  I saw [name] in early April in the setting of a several month history of dyspnea on exertion. She was not having any chest pain. I obtained a [employer] which showed no evidence of ischemia or infarct with normal LV function. Since her last visit, she says she has been increasing her activity more and is feeling much better. She suspects that she was most likely just deconditioned in the setting of shingles and not being as active as she previously had been. She now notes  much improved exercise tolerance and has not had any chest pain. She is hopeful to return to water aerobics soon. She denies PND, orthopnea, dizziness, syncope, edema, or early satiety.  Home Medications    Prior to Admission medications   Medication Sig Start Date End Date Taking? Authorizing Provider  aspirin EC 325 MG EC tablet Take 1 tablet (325 mg total) by mouth daily. 03/18/16  Yes Joseph Art, DO  calcium carbonate (OS-CAL) 600 MG TABS tablet Take 600 mg by mouth every evening.    Yes Historical Provider, MD  Cholecalciferol (VITAMIN D3) 1000 units CAPS Take 1,000  Units by mouth daily.   Yes Historical Provider, MD  loratadine (CLARITIN) 10 MG tablet Take 10 mg by mouth daily as needed.    Yes Historical Provider, MD  losartan (COZAAR) 50 MG tablet Take 1 tablet (50 mg total) by mouth daily. 11/17/12  Yes Dayna N Dunn, PA-C  metoprolol succinate (TOPROL-XL) 25 MG 24 hr tablet Take 1 tablet by mouth daily. 04/20/16  Yes Historical Provider, MD  nitroGLYCERIN (NITROSTAT) 0.4 MG SL tablet Place 1 tablet (0.4 mg total) under the tongue every 5 (five) minutes as needed. For chest pain 08/22/15  Yes Peter M [place], MD  pantoprazole (PROTONIX) 40 MG tablet Take 1 tablet by mouth daily. 02/10/16  Yes Historical Provider, MD  rosuvastatin (CRESTOR) 20 MG tablet Take 1 tablet (20 mg total) by mouth at bedtime. 01/27/16  Yes Peter M [place], MD  traMADol (ULTRAM) 50 MG tablet Take 50 mg by mouth every 6 (six) hours as needed.   Yes Historical Provider, MD  Zoledronic Acid (RECLAST IV) Inject into the vein. Once Yearly   Yes Historical Provider, MD    Review of Systems    As above, improved dyspnea and exercise tolerance. She denies chest pain, palpitations, PND, orthopnea, dizziness, syncope, edema, or early satiety. All other systems reviewed and are otherwise negative except as noted above.  Physical Exam    VS:  BP 112/70   Pulse (!) 56   Ht  (1.651 m)   Wt 162 lb 6.4 oz (73.7 kg)   BMI 27.02 kg/m  , BMI Body mass index is 27.02 kg/m. GEN: Well nourished, well developed, in no acute distress.  HEENT: normal.  Neck: Supple, no JVD, carotid bruits, or masses. Cardiac: RRR, no murmurs, rubs, or gallops. No clubbing, cyanosis, edema.  Radials/DP/PT 2+ and equal bilaterally.  Respiratory:  Respirations regular and unlabored, clear to auscultation bilaterally. GI: Soft, nontender, nondistended, BS + x 4. MS: no deformity or atrophy. Skin: warm and dry, no rash. Neuro:  Strength and sensation are intact. Psych: Normal affect.  Accessory Clinical Findings      Lexiscan Myoview-07/10/2016  The left ventricular ejection fraction is normal (55-65%). Nuclear stress EF: 56%. There was no ST segment deviation noted during stress. The study is normal. This is a low risk study.   Assessment & Plan    1.  Dyspnea on exertion/coronary artery disease: Upon her last visit in early April, she was having dyspnea on exertion in the setting of deconditioning. She undertook stress testing, which was normal. We discussed this today. LV function was also noted to be normal in December 2017. She has since increased her activity and has noted improved exercise tolerance without chest pain. I'm encouraged by this. She remains on aspirin, beta blocker, ARB, and statin therapy.  2. Essential hypertension: Blood pressure stable on beta blocker and ARB therapy.  3. Hyperlipidemia:  She has resumed Crestor. LDL was 62 in December 2017.  4. Shingles: This continues to improve. She still has some tingling over her left forehead but this has become more tolerable. She is no longer requiring pain medication.  5. Disposition: She will follow up with Dr. [place] in 6 months or sooner if necessary.  Nicolasa Ducking, NP 08/05/2016, 11:38 AM

## 2016-08-05 NOTE — Patient Instructions (Signed)
Medication Instructions: No changes  Follow-Up: Your physician wants you to follow-up in: 6 months with Dr. Jordan. You will receive a reminder letter in the mail two months in advance. If you don't receive a letter, please call our office to schedule the follow-up appointment.   If you need a refill on your cardiac medications before your next appointment, please call your pharmacy.   

## 2017-01-22 ENCOUNTER — Encounter: Payer: Self-pay | Admitting: Cardiology

## 2017-02-03 NOTE — Progress Notes (Signed)
Office Visit    Patient Name: [name] Date of Encounter: 02/08/2017  Primary Care Provider:  Burton Apley, MD Primary Cardiologist:  P. [place], MD   Chief Complaint    76 year old female with a prior history of coronary artery disease status post anterior MI and LAD bare-metal stenting complicated by VF arrest in 2011, hypertension, hyperlipidemia, GERD, TIA,who presents for follow-up.  Past Medical History    Past Medical History:  Diagnosis Date  . Compression fracture    T9  . COPD (chronic obstructive pulmonary disease) (HCC)   . Coronary artery disease 08/14/2009   a. 08/2009: Ant MI c/b v-fib arrest, s/p PTCA/BMS to LAD. (STENT Placement); b. 2013 Myoview: no ischemia; c. 11/2012 Cath: patent mLAD stent, RI 70 (stable), otw nonobs dzs, EF 65%;  d. 07/2016 Lexiscan MV: EF 56%, no ischemia/infarct.  Marland Kitchen GERD (gastroesophageal reflux disease)   . HTN (hypertension)   . Hyperlipidemia   . Ischemic cardiomyopathy    a. EF 40-45% in 08/2009 at time of MI. b. Improved to 55-60% by June 2011; c. 03/2016 Echo: EF 55-60%, no rwma, Gr1 DD, mild LVH.  . Osteoarthritis   . Osteopenia   . PONV (postoperative nausea and vomiting)   . Postoperative groin pseudoaneurysm (HCC)    a. 04/2010 following diagnostic cardiac cath, s/p compression.  . Rib fractures    left sided second and sixth rib   . Shingles   . TIA (transient ischemic attack)    a. 03/2016 MRI/MRA negative/unremarkable.  . Ventricular fibrillation (HCC)    a. 08/2009: due to anterior MI.   Past Surgical History:  Procedure Laterality Date  . APPENDECTOMY    . BACK SURGERY    . LEG SURGERY     R low  . ORIF HIP FRACTURE    . TONSILLECTOMY      Allergies  Allergies  Allergen Reactions  . Codeine Nausea And Vomiting  . Imdur [Isosorbide Dinitrate] Nausea Only    Headache Headache  . Other Nausea And Vomiting  . Sulfonamide Derivatives Nausea And Vomiting    History of Present Illness    76 year old  female with the above complex past medical history. She is status post anterior MI and VF arrest in May 2011 with bare-metal stenting of the LAD. EF was 40-45% at that time but subsequently recovered. Last stress test was in 2013 and was nonischemic. Her last catheterization took place in August 2014 which revealed stable 70% stenosis within the ramus intermedius and a patent LAD stent. EF was 65% at that time. Other history includes hypertension, HL, ICM with subsequent Normalization of LV function, TIA in December 2017.  In April 2018 she had a  [employer] which showed no evidence of ischemia or infarct with normal LV function. Since her last visit, she says she is doing well. No chest pain. She has almost fully recovered from having shingles.  She is doing some water aerobics. She denies PND, orthopnea, dizziness, syncope, edema, or early satiety. She did run out of her losartan 2 months ago and hasn't been taking it.   Home Medications    Prior to Admission medications   Medication Sig Start Date End Date Taking? Authorizing Provider  aspirin EC 325 MG EC tablet Take 1 tablet (325 mg total) by mouth daily. 03/18/16  Yes Joseph Art, DO  calcium carbonate (OS-CAL) 600 MG TABS tablet Take 600 mg by mouth every evening.    Yes Historical Provider, MD  Cholecalciferol (  VITAMIN D3) 1000 units CAPS Take 1,000 Units by mouth daily.   Yes Historical Provider, MD  loratadine (CLARITIN) 10 MG tablet Take 10 mg by mouth daily as needed.    Yes Historical Provider, MD  losartan (COZAAR) 50 MG tablet Take 1 tablet (50 mg total) by mouth daily. 11/17/12  Yes Dayna N Dunn, PA-C  metoprolol succinate (TOPROL-XL) 25 MG 24 hr tablet Take 1 tablet by mouth daily. 04/20/16  Yes Historical Provider, MD  nitroGLYCERIN (NITROSTAT) 0.4 MG SL tablet Place 1 tablet (0.4 mg total) under the tongue every 5 (five) minutes as needed. For chest pain 08/22/15  Yes Chancy Smigiel M [place], MD  pantoprazole (PROTONIX) 40 MG tablet  Take 1 tablet by mouth daily. 02/10/16  Yes Historical Provider, MD  rosuvastatin (CRESTOR) 20 MG tablet Take 1 tablet (20 mg total) by mouth at bedtime. 01/27/16  Yes Rodel Glaspy M [place], MD  traMADol (ULTRAM) 50 MG tablet Take 50 mg by mouth every 6 (six) hours as needed.   Yes Historical Provider, MD  Zoledronic Acid (RECLAST IV) Inject into the vein. Once Yearly   Yes Historical Provider, MD    Review of Systems    As above, improved dyspnea and exercise tolerance. She denies chest pain, palpitations, PND, orthopnea, dizziness, syncope, edema, or early satiety. All other systems reviewed and are otherwise negative except as noted above.  Physical Exam    VS:  BP 136/84   Pulse 62   Ht 5\' 5"  (1.651 m)   Wt 166 lb (75.3 kg)   SpO2 98%   BMI 27.62 kg/m  , BMI Body mass index is 27.62 kg/m. GENERAL:  Well appearing WF in NAD HEENT:  PERRL, EOMI, sclera are clear. Oropharynx is clear. NECK:  No jugular venous distention, carotid upstroke brisk and symmetric, no bruits, no thyromegaly or adenopathy LUNGS:  Clear to auscultation bilaterally CHEST:  Unremarkable HEART:  RRR,  PMI not displaced or sustained,S1 and S2 within normal limits, no S3, no S4: no clicks, no rubs, no murmurs ABD:  Soft, nontender. BS +, no masses or bruits. No hepatomegaly, no splenomegaly EXT:  2 + pulses throughout, no edema, no cyanosis no clubbing SKIN:  Warm and dry.  No rashes NEURO:  Alert and oriented x 3. Cranial nerves II through XII intact. PSYCH:  Cognitively intact   Accessory Clinical Findings     Laboratory data:  Lab Results  Component Value Date   WBC 8.1 03/16/2016   HGB 13.6 03/16/2016   HCT 40.0 03/16/2016   PLT 278 03/16/2016   GLUCOSE 97 03/16/2016   CHOL 158 03/17/2016   TRIG 72 03/17/2016   HDL 82 03/17/2016   LDLDIRECT 134.9 11/21/2007   LDLCALC 62 03/17/2016   ALT 18 03/16/2016   AST 24 03/16/2016   NA 134 (L) 03/16/2016   K 4.5 03/16/2016   CL 95 (L) 03/16/2016    CREATININE 0.70 03/16/2016   BUN 12 03/16/2016   CO2 28 03/16/2016   TSH 1.232 03/21/2015   INR 0.98 03/16/2016   HGBA1C 5.8 (H) 03/17/2016    Lexiscan Myoview-07/10/2016  The left ventricular ejection fraction is normal (55-65%). Nuclear stress EF: 56%. There was no ST segment deviation noted during stress. The study is normal. This is a low risk study.   Assessment & Plan    1.  Coronary artery disease: Normal Myoview study in April. Now asymptomatic. Continue medical therapy. Encourage increase in aerobic activity.  2. Essential hypertension: Blood pressure higher today.  Will resume losartan 50 mg daily.  3. Hyperlipidemia: She has resumed Crestor. LDL was 62 in December 2017. Will request last lab work in September from primary care.   Follow up in 6 months.  Chantale Leugers [place], MD,FACC 02/08/2017, 2:40 PM

## 2017-02-08 ENCOUNTER — Telehealth: Payer: Self-pay | Admitting: Cardiology

## 2017-02-08 ENCOUNTER — Ambulatory Visit (INDEPENDENT_AMBULATORY_CARE_PROVIDER_SITE_OTHER): Payer: Medicare Other | Admitting: Cardiology

## 2017-02-08 ENCOUNTER — Other Ambulatory Visit: Payer: Self-pay | Admitting: *Deleted

## 2017-02-08 ENCOUNTER — Encounter: Payer: Self-pay | Admitting: Cardiology

## 2017-02-08 VITALS — BP 136/84 | HR 62 | Ht 65.0 in | Wt 166.0 lb

## 2017-02-08 DIAGNOSIS — E7800 Pure hypercholesterolemia, unspecified: Secondary | ICD-10-CM

## 2017-02-08 DIAGNOSIS — I10 Essential (primary) hypertension: Secondary | ICD-10-CM

## 2017-02-08 DIAGNOSIS — I2510 Atherosclerotic heart disease of native coronary artery without angina pectoris: Secondary | ICD-10-CM

## 2017-02-08 MED ORDER — LOSARTAN POTASSIUM 50 MG PO TABS: 50.0000 mg | ORAL_TABLET | Freq: Every day | ORAL | 3 refills | Status: DC

## 2017-02-08 MED ORDER — ASPIRIN EC 81 MG PO TBEC: 81.0000 mg | DELAYED_RELEASE_TABLET | Freq: Every day | ORAL | 3 refills | Status: AC

## 2017-02-08 MED ORDER — LOSARTAN POTASSIUM 50 MG PO TABS: 50.0000 mg | ORAL_TABLET | Freq: Every day | ORAL | 1 refills | Status: DC

## 2017-02-08 NOTE — Telephone Encounter (Signed)
New Message   *STAT* If patient is at the pharmacy, call can be transferred to refill team.   1. Which medications need to be refilled? (please list name of each medication and dose if known) losartan   2. Which pharmacy/location (including street and city if local pharmacy) is medication to be sent to?[facility]   3. Do they need a 30 day or 90 day supply? 90

## 2017-02-08 NOTE — Patient Instructions (Signed)
Reduce ASA to 81 mg daily  Resume losartan 50 mg daily  We will request a copy of your last blood work

## 2017-03-19 ENCOUNTER — Encounter: Payer: Self-pay | Admitting: Podiatry

## 2017-03-19 ENCOUNTER — Ambulatory Visit (INDEPENDENT_AMBULATORY_CARE_PROVIDER_SITE_OTHER): Payer: Medicare Other | Admitting: Podiatry

## 2017-03-19 VITALS — BP 131/78 | HR 67 | Resp 16

## 2017-03-19 DIAGNOSIS — L600 Ingrowing nail: Secondary | ICD-10-CM | POA: Diagnosis not present

## 2017-03-19 DIAGNOSIS — B351 Tinea unguium: Secondary | ICD-10-CM

## 2017-03-19 DIAGNOSIS — M79675 Pain in left toe(s): Secondary | ICD-10-CM

## 2017-03-19 DIAGNOSIS — I2510 Atherosclerotic heart disease of native coronary artery without angina pectoris: Secondary | ICD-10-CM

## 2017-03-19 DIAGNOSIS — M79674 Pain in right toe(s): Secondary | ICD-10-CM | POA: Diagnosis not present

## 2017-03-19 NOTE — Progress Notes (Signed)
   Subjective:    Patient ID: [name], female    DOB: 07/10/1940, 76 y.o.   MRN: [id number]  HPI    Review of Systems  All other systems reviewed and are negative.      Objective:   Physical Exam        Assessment & Plan:

## 2017-03-20 NOTE — Progress Notes (Signed)
Subjective:   Patient ID: [name], female   DOB: 76 y.o.   MRN: [id number]   HPI Patient presents with very thickened hallux nail bilateral that are dystrophic and she states are very painful.  She knows she needs to have them removed long-term but she does have a holidays coming up patient does not smoke and likes to be active   Review of Systems  All other systems reviewed and are negative.       Objective:  Physical Exam  Constitutional: She appears well-developed and well-nourished.  Cardiovascular: Intact distal pulses.  Pulmonary/Chest: Effort normal.  Musculoskeletal: Normal range of motion.  Neurological: She is alert.  Skin: Skin is warm.  Nursing note and vitals reviewed.   Neurovascular status was found to be intact with muscle strength adequate range of motion within normal limits.  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  Explained procedure and risk

## 2017-04-16 ENCOUNTER — Ambulatory Visit: Payer: Medicare Other | Admitting: Podiatry

## 2017-04-23 ENCOUNTER — Encounter: Payer: Self-pay | Admitting: Podiatry

## 2017-04-23 ENCOUNTER — Ambulatory Visit (INDEPENDENT_AMBULATORY_CARE_PROVIDER_SITE_OTHER): Payer: Medicare Other | Admitting: Podiatry

## 2017-04-23 DIAGNOSIS — B351 Tinea unguium: Secondary | ICD-10-CM | POA: Diagnosis not present

## 2017-04-23 DIAGNOSIS — L600 Ingrowing nail: Secondary | ICD-10-CM

## 2017-04-23 NOTE — Patient Instructions (Signed)

## 2017-04-27 NOTE — Progress Notes (Signed)
Subjective:   Patient ID: [name], female   DOB: 77 y.o.   MRN: [id number]   HPI Patient presents stating I have got some thick toenails but I decided not to have them fixed permanently today I would rather have it done in a conservative fashion   ROS      Objective:  Physical Exam  Neurovascular status is intact with thick incurvated nailbeds hallux bilateral that are dystrophic in their nature     Assessment:  H&P condition reviewed and discussed this with patient.  At this point she does have thickened mycotic nail infection     Plan:  We will continue conservative and I debrided the nails thin took the corners out and this will be done and that ultimately she may decide on having them removed permanently

## 2017-05-14 ENCOUNTER — Ambulatory Visit (INDEPENDENT_AMBULATORY_CARE_PROVIDER_SITE_OTHER)
Admission: RE | Admit: 2017-05-14 | Discharge: 2017-05-14 | Disposition: A | Discharge status: Home / Self Care | Payer: Medicare Other | Source: Ambulatory Visit | Attending: Internal Medicine | Admitting: Internal Medicine

## 2017-05-14 ENCOUNTER — Encounter: Payer: Self-pay | Admitting: Internal Medicine

## 2017-05-14 ENCOUNTER — Other Ambulatory Visit (INDEPENDENT_AMBULATORY_CARE_PROVIDER_SITE_OTHER): Payer: Medicare Other

## 2017-05-14 ENCOUNTER — Ambulatory Visit (INDEPENDENT_AMBULATORY_CARE_PROVIDER_SITE_OTHER): Payer: Medicare Other | Admitting: Internal Medicine

## 2017-05-14 VITALS — BP 124/74 | HR 67 | Ht 65.5 in | Wt 153.0 lb

## 2017-05-14 DIAGNOSIS — R0609 Other forms of dyspnea: Secondary | ICD-10-CM | POA: Diagnosis not present

## 2017-05-14 DIAGNOSIS — J449 Chronic obstructive pulmonary disease, unspecified: Secondary | ICD-10-CM

## 2017-05-14 LAB — BASIC METABOLIC PANEL
BUN: 13 mg/dL (ref 6–23)
CO2: 32 mEq/L (ref 19–32)
Calcium: 9.2 mg/dL (ref 8.4–10.5)
Chloride: 96 mEq/L (ref 96–112)
Creatinine, Ser: 0.72 mg/dL (ref 0.40–1.20)
GFR: 83.35 mL/min (ref >60.00)
Glucose, Bld: 107 mg/dL — ABNORMAL HIGH (ref 70–99)
Potassium: 3.7 mEq/L (ref 3.5–5.1)
Sodium: 134 mEq/L — ABNORMAL LOW (ref 135–145)

## 2017-05-14 LAB — CBC WITH DIFFERENTIAL/PLATELET
Basophils Absolute: 37 cells/uL (ref 0–200)
Basophils Relative: 0.6 %
Eosinophils Absolute: 68 cells/uL (ref 15–500)
Eosinophils Relative: 1.1 %
HCT: 39.8 % (ref 35.0–45.0)
Hemoglobin: 13.7 g/dL (ref 11.7–15.5)
Lymphs Abs: 1066 cells/uL (ref 850–3900)
MCH: 34 pg — ABNORMAL HIGH (ref 27.0–33.0)
MCHC: 34.4 g/dL (ref 32.0–36.0)
MCV: 98.8 fL (ref 80.0–100.0)
MPV: 10 fL (ref 7.5–12.5)
Monocytes Relative: 10.6 %
Neutro Abs: 4371 cells/uL (ref 1500–7800)
Neutrophils Relative %: 70.5 %
Platelets: 315 10*3/uL (ref 140–400)
RBC: 4.03 10*6/uL (ref 3.80–5.10)
RDW: 11.7 % (ref 11.0–15.0)
Total Lymphocyte: 17.2 %
WBC mixed population: 657 cells/uL (ref 200–950)
WBC: 6.2 10*3/uL (ref 3.8–10.8)

## 2017-05-14 LAB — BRAIN NATRIURETIC PEPTIDE: Pro B Natriuretic peptide (BNP): 42 pg/mL (ref 0.0–100.0)

## 2017-05-14 LAB — TSH: TSH: 1.72 u[IU]/mL (ref 0.35–4.50)

## 2017-05-14 MED ORDER — FAMOTIDINE 20 MG PO TABS: ORAL_TABLET | ORAL | Status: DC

## 2017-05-14 NOTE — Progress Notes (Signed)
Subjective:    Patient ID: [name], female    DOB: 03-07-1941     MRN: [id number]    Brief patient profile:  20 yowf   MM  quit smoking in 1986 with am throat congestion and felt fine until around 2012 with onset of doe so referred to pulmonary clinic 08/07/2015 by Dr Elmore Guise    History of Present Illness  08/07/2015 1st [facility] office visit/ [name]   Chief Complaint  Patient presents with  . Pulmonary Consult    Referred by Dr. [name]. Pt c/o SOB x 5 yrs- worse x 4 months. She is SOB with grocery shopping or lifting things. She also c/o occ PND and am cough- yellow to clear sputum.   indolent onset doe x 5 years  To point where has trouble carrying groceries from car to house and up x 3 steps and no real change on dulera or symbicort to date - thought advair workded better. Breathing Ok at hs and in am Does fine if rests or paces  Problems with doe also while vacuuming for more than 5 min- note Sometimes gags and heaves during coughing fits she attributes to pnds , esp in am but otherwise no excess/ purulent sputum or mucus plugs   RECS Add pepcid 20 mg at bedtime to the protonix before bfast Stop symbicort but if breathing worse restart except try no to take it am of test  GERD  Diet   Please schedule a follow up office visit in 6 weeks, call sooner if needed with pfts ok to push back a few weeks if needed > did not return as req     05/14/2017 acute extended ov/[name] re: sob/ cough full  pfts never done  Chief Complaint  Patient presents with  . Acute Visit    Increased SOB since end of Dec 2018. She gets SOB with exertion such as carrying her groceries in. She also c/o PND and occ cough with clear sputum.    prior to end Dec 2018 since last visit on no inhalers / on ppi before bfast s h2 hs > cough not better but able to do water aerobics twice weekly x one hour  Gagging and heaving / worse with brush teeth  since end of dec grad worse Doe to point of = MMRC3 =  can't walk 100 yards even at a slow pace at a flat grade s stopping due to sob  And reported "wheezing" with exertion (niether symptom reproduced on today's walk)  Sleeps flat ok   No obvious patterns in day to day or daytime variability or assoc excess/ purulent sputum or mucus plugs or hemoptysis or cp or chest tightness,   or overt sinus or hb symptoms. No unusual exposure hx or h/o childhood pna/ asthma or knowledge of premature birth.  Sleeping ok flat without nocturnal  or early am exacerbation  of respiratory  c/o's or need for noct saba. Also denies any obvious fluctuation of symptoms with weather or environmental changes or other aggravating or alleviating factors except as outlined above   Current Allergies, Complete Past Medical History, Past Surgical History, Family History, and Social History were reviewed in [employer] record.  ROS  The following are not active complaints unless bolded Hoarseness, sore throat, dysphagia, dental problems, itching, sneezing,  nasal congestion better with clariton or discharge of excess mucus or purulent secretions, ear ache,   fever, chills, sweats, unintended wt loss or wt gain,  classically pleuritic or exertional cp,  orthopnea pnd or leg swelling, presyncope, palpitations, abdominal pain, anorexia, nausea, vomiting(heaving) , diarrhea  or change in bowel habits or change in bladder habits, change in stools or change in urine, dysuria, hematuria,  rash, arthralgias, visual complaints, headache, numbness, weakness or ataxia or problems with walking or coordination,  change in mood/affect or memory.        Current Meds  Medication Sig  . aspirin EC 81 MG tablet Take 1 tablet (81 mg total) daily by mouth.  . calcium carbonate (OS-CAL) 600 MG TABS tablet Take 600 mg by mouth every evening.   . Cholecalciferol (VITAMIN D3) 1000 units CAPS Take 1,000 Units by mouth daily.  Marland Kitchen. loratadine (CLARITIN) 10 MG tablet Take 10 mg by mouth  daily as needed.   Marland Kitchen. losartan (COZAAR) 50 MG tablet Take 1 tablet (50 mg total) daily by mouth.  . metoprolol succinate (TOPROL-XL) 25 MG 24 hr tablet Take 1 tablet by mouth daily.  . nitroGLYCERIN (NITROSTAT) 0.4 MG SL tablet Place 1 tablet (0.4 mg total) under the tongue every 5 (five) minutes as needed. For chest pain  . pantoprazole (PROTONIX) 40 MG tablet Take 1 tablet by mouth daily.  . rosuvastatin (CRESTOR) 20 MG tablet Take 1 tablet (20 mg total) by mouth at bedtime.  . traMADol (ULTRAM) 50 MG tablet Take 50 mg by mouth every 6 (six) hours as needed.  . Zoledronic Acid (RECLAST IV) Inject into the vein. Once Yearly                                  Objective:   Physical Exam  Somber amb wf nad / freq throat clearing   05/14/2017         153   08/07/15 164 lb (74.39 kg)  06/11/15 160 lb (72.576 kg)  05/09/15 160 lb 3 oz (72.661 kg)     Vital signs reviewed - Note on arrival 02 sats  97% on RA         HEENT: nl   turbinates bilaterally, and oropharynx. Nl external ear canals without cough reflex - upper dentures   NECK :  without JVD/Nodes/TM/ nl carotid upstrokes bilaterally   LUNGS: no acc muscle use,  Kyphotic chest wall  And clear to A and P bilaterally without cough on insp or exp maneuvers   CV:  RRR  no s3 or murmur or increase in P2, and no edema   ABD:  soft and nontender with nl inspiratory excursion in the supine position. No bruits or organomegaly appreciated, bowel sounds nl  MS:  Nl gait/ ext warm without deformities, calf tenderness, cyanosis or clubbing No obvious joint restrictions   SKIN: warm and dry without lesions    NEURO:  alert, approp, nl sensorium with  no motor or cerebellar deficits apparent.    CXR PA and Lateral:   05/14/2017 :    I personally reviewed images and agree with radiology impression as follows:   No acute cardiopulmonary disease.Stable changes of chronic bronchitis and/or asthma.   Labs ordered/  reviewed:      Chemistry      Component Value Date/Time   NA 134 (L) 05/14/2017 0947   K 3.7 05/14/2017 0947   CL 96 05/14/2017 0947   CO2 32 05/14/2017 0947   BUN 13 05/14/2017 0947   CREATININE 0.72 05/14/2017 0947      Component Value  Date/Time   CALCIUM 9.2 05/14/2017 0947   ALKPHOS 56 03/16/2016 1219   AST 24 03/16/2016 1219   ALT 18 03/16/2016 1219   BILITOT 0.7 03/16/2016 1219        Lab Results  Component Value Date   WBC 6.2 05/14/2017   HGB 13.7 05/14/2017   HCT 39.8 05/14/2017   MCV 98.8 05/14/2017   PLT 315 05/14/2017      Lab Results  Component Value Date   TSH 1.72 05/14/2017     Lab Results  Component Value Date   PROBNP 42.0 05/14/2017       Labs ordered 05/14/2017  Allergy profile         Assessment & Plan:

## 2017-05-14 NOTE — Patient Instructions (Signed)
Add pepcid 20 mg at bedtime  GERD (REFLUX)  is an extremely common cause of respiratory symptoms just like yours , many times with no obvious heartburn at all.    It can be treated with medication, but also with lifestyle changes including elevation of the head of your bed (ideally with 6 inch  bed blocks),  Smoking cessation, avoidance of late meals, excessive alcohol, and avoid fatty foods, chocolate, peppermint, colas, red wine, and acidic juices such as orange juice.  NO MINT OR MENTHOL PRODUCTS SO NO COUGH DROPS   USE SUGARLESS CANDY INSTEAD (Jolley ranchers or Stover's or Life Savers) or even ice chips will also do - the key is to swallow to prevent all throat clearing. NO OIL BASED VITAMINS - use powdered substitutes.    Please remember to go to the lab and x-ray department downstairs in the basement  for your tests - we will call you with the results when they are available.     Please schedule a follow up office visit in 4 weeks, sooner if needed with pfts on return

## 2017-05-14 NOTE — Progress Notes (Signed)
Spoke with pt and notified of results per Dr. Wert. Pt verbalized understanding and denied any questions. 

## 2017-05-16 IMAGING — CT CT RENAL STONE PROTOCOL
2 of 4 series · 16 of 46 positions shown, 18 images · non-contrast
Comparison: 05/18/2014

CLINICAL DATA: Right-sided abdominal pain

EXAM:
CT ABDOMEN AND PELVIS WITHOUT CONTRAST
TECHNIQUE: Multidetector CT imaging of the abdomen and pelvis was performed
following the standard protocol without IV contrast.

[Series 2: renal stone 5mm · axial · 0.76mm/px · z∈[-571,-161]mm · 13 of 90 slices shown, 15 images]
[im 4/90  soft-tissue]
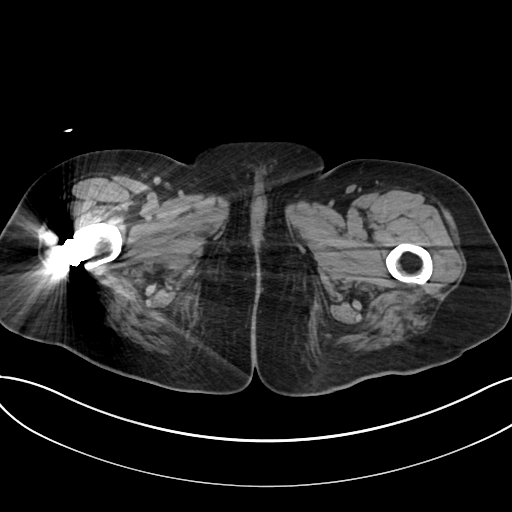
[im 4/90  bone]
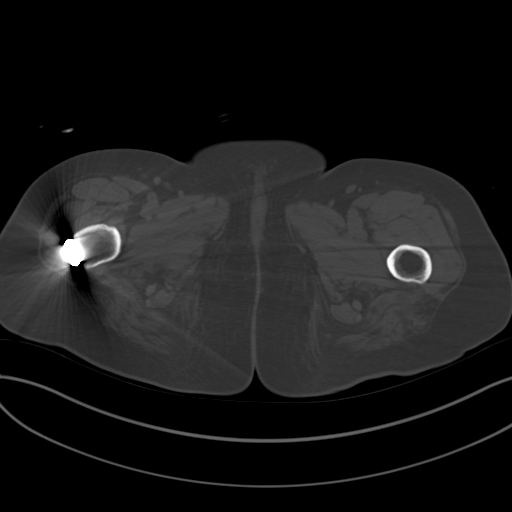
[im 12/90  soft-tissue]
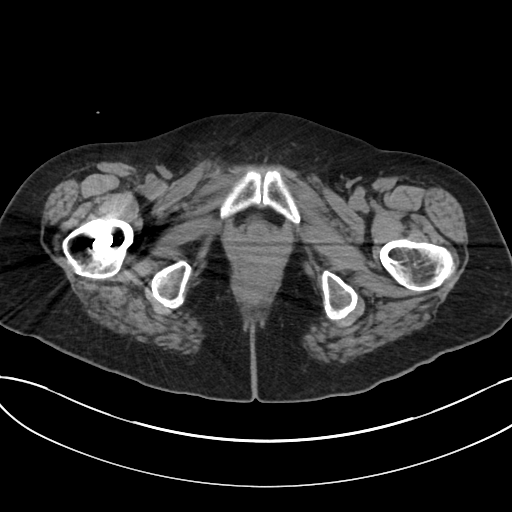
[im 20/90  soft-tissue]
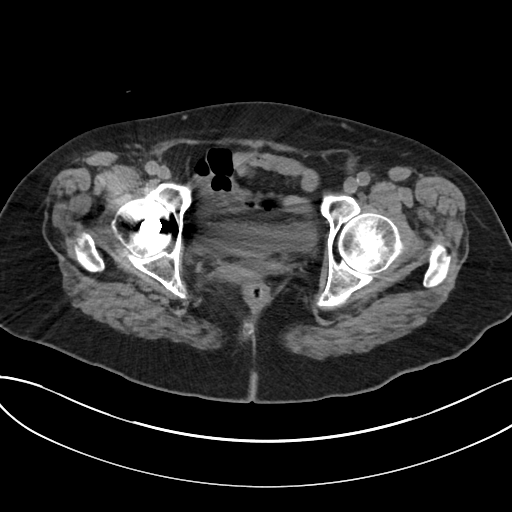
[im 24/90  soft-tissue]
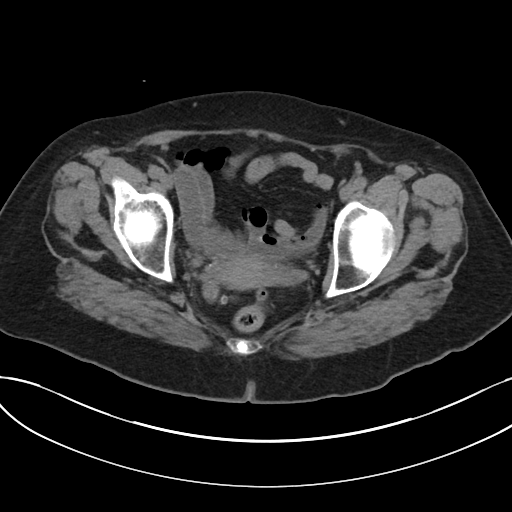
[im 31/90  soft-tissue]
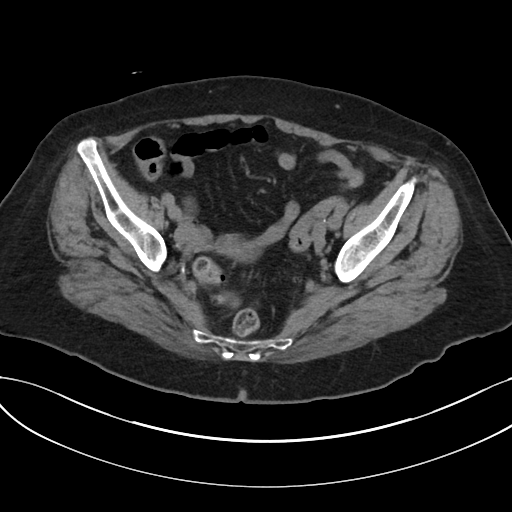
[im 39/90  soft-tissue]
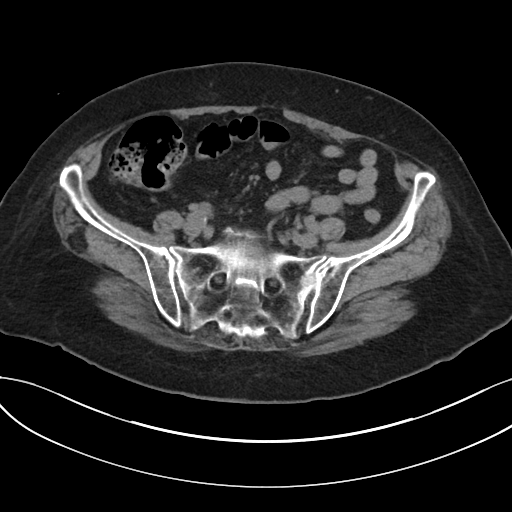
[im 47/90  soft-tissue]
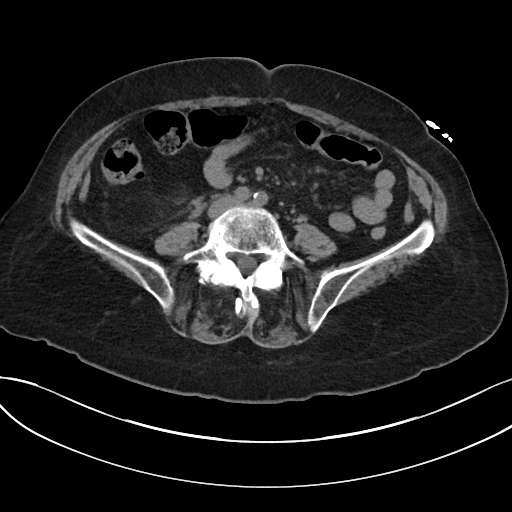
[im 51/90  soft-tissue]
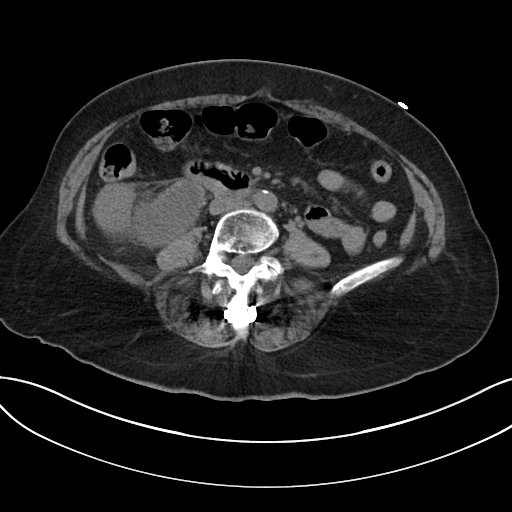
[im 59/90  soft-tissue]
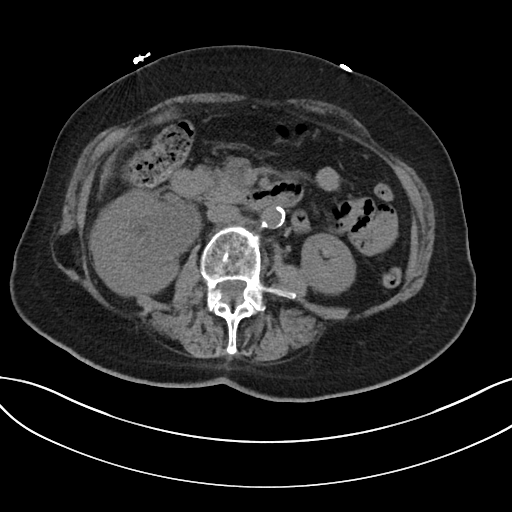
[im 59/90  bone]
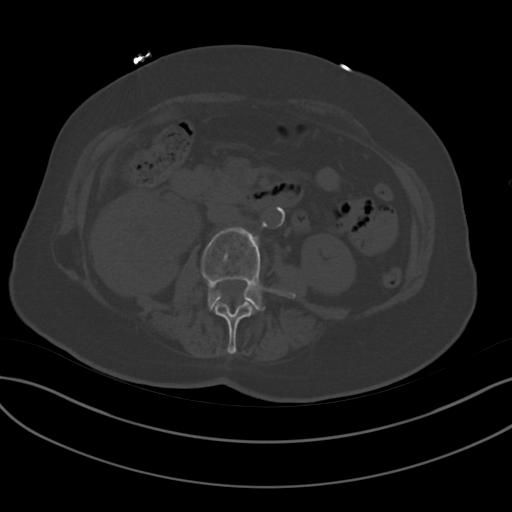
[im 66/90  soft-tissue]
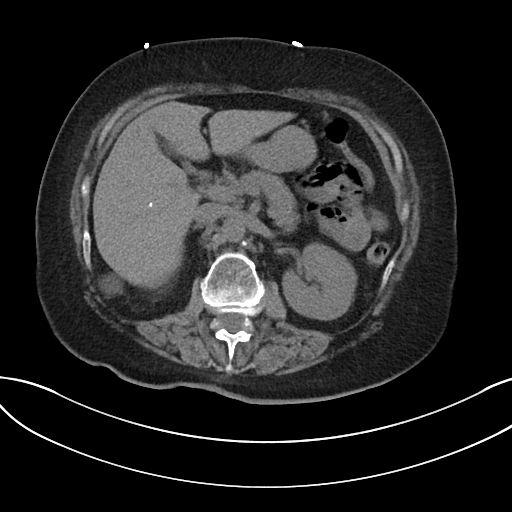
[im 70/90  soft-tissue]
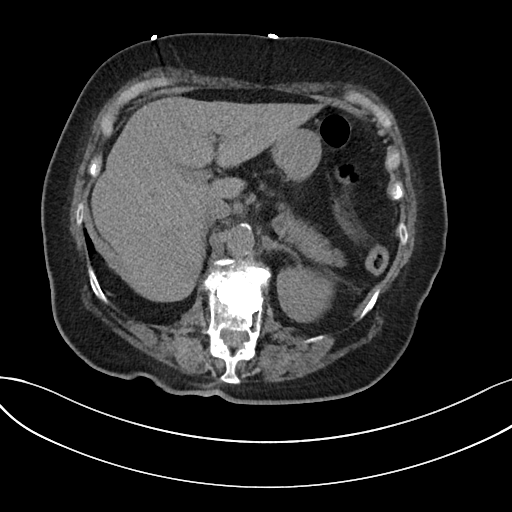
[im 78/90  soft-tissue]
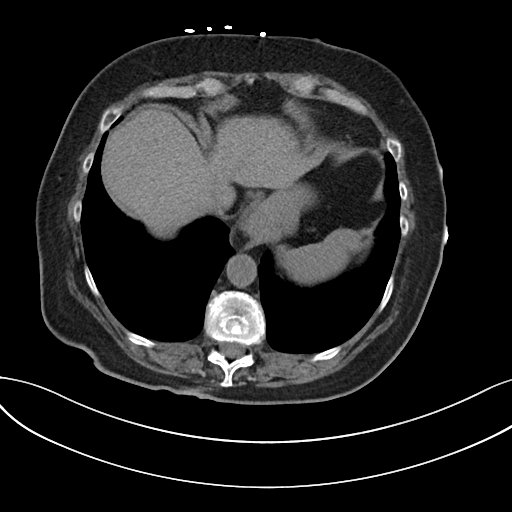
[im 86/90  soft-tissue]
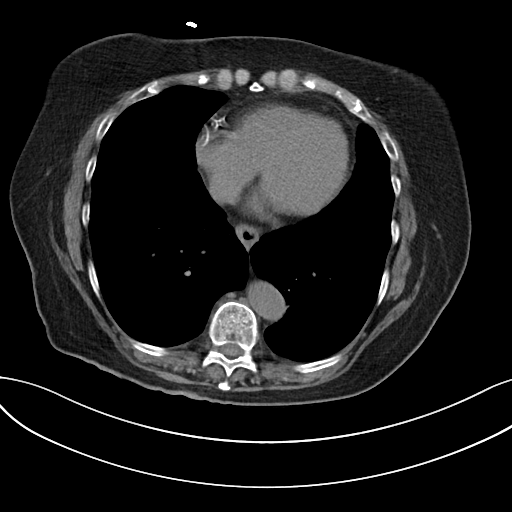

[Series 4: renal stone 3.0 cor · coronal · 0.80mm/px · 3 of 78 slices shown]
[im 26/78  soft-tissue]
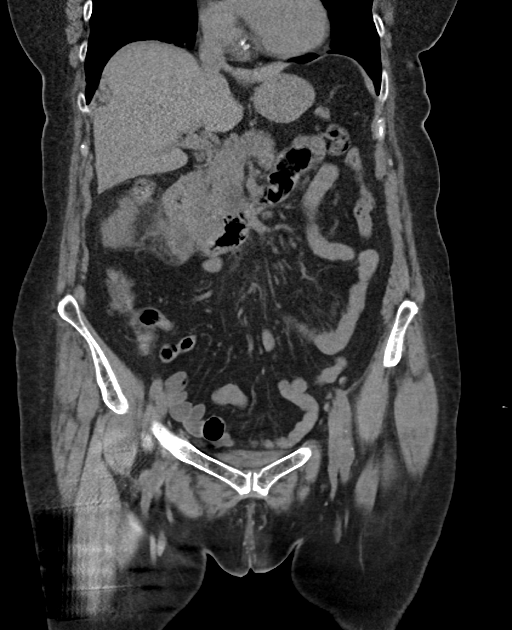
[im 35/78  soft-tissue]
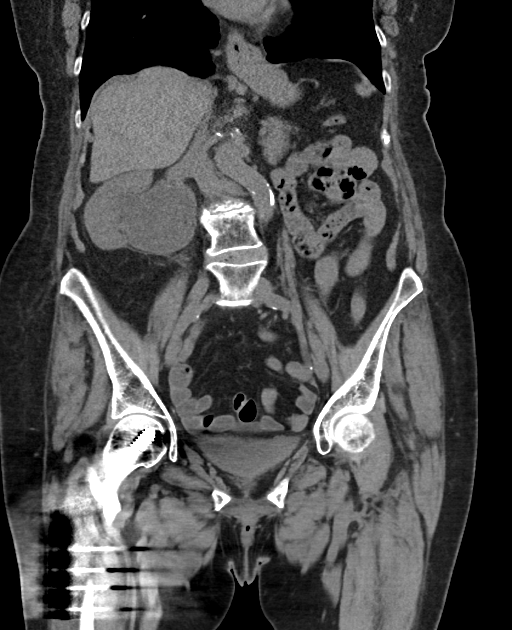
[im 43/78  soft-tissue]
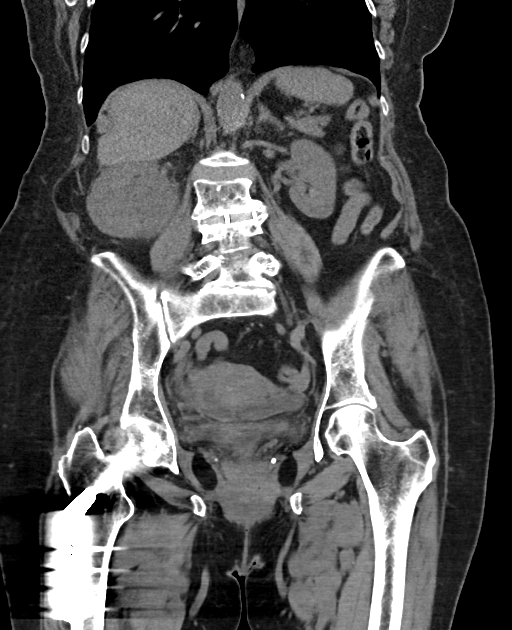

[16 of 46 positions shown; findings below may reference images not displayed]

FINDINGS: Lung bases are free of acute infiltrate or sizable effusion. A small
sliding-type hiatal hernia is noted. The liver, gallbladder, spleen,
adrenal glands and pancreas are within normal limits with the
exception of a small hepatic calcification which may represent a
granuloma. Left kidney shows no renal calculi or obstructive
changes. The left ureter is within normal limits. The right kidney
shows fullness of the collecting system which terminates at the you
had UPJ consistent with a UPJ stenosis. No focal stone is
identified. A renal cyst is again noted in the upper pole similar to
that seen on the prior exam. The distal right ureter is within
normal limits. The bladder is decompressed.

Diverticular change of the colon is noted. No evidence of
diverticulitis is seen. Diffuse aortoiliac calcifications are noted
without aneurysmal dilatation. No pelvic mass lesion is seen. The
osseous structures show postsurgical change in the left hip and
lower lumbar spine. Changes of prior vertebral augmentation are
noted.
IMPRESSION: Significant hydronephrosis in the right kidney likely related to UPJ
stenosis. This is worse than that seen on the prior exam.

The remainder of the exam is stable from the prior study.

## 2017-05-16 IMAGING — CT CT ANGIO CHEST
2 of 6 series · 19 of 36 positions shown · IV contrast (Omni 300)
Comparison: None.

CLINICAL DATA: Right-sided chest pain and shortness of Breath

EXAM:
CT ANGIOGRAPHY CHEST WITH CONTRAST
TECHNIQUE: Multidetector CT imaging of the chest was performed using the
standard protocol during bolus administration of intravenous
contrast. Multiplanar CT image reconstructions and MIPs were
obtained to evaluate the vascular anatomy.
CONTRAST:  100mL OMNIPAQUE IOHEXOL 350 MG/ML SOLN

[Series 7: pe thins · axial · 0.63mm/px · z∈[-202,+37]mm · 18 of 529 slices shown]
[im 26/529  lung]
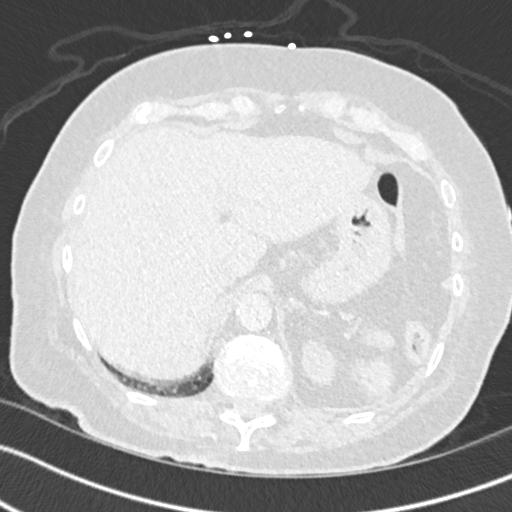
[im 51/529  mediastinal]
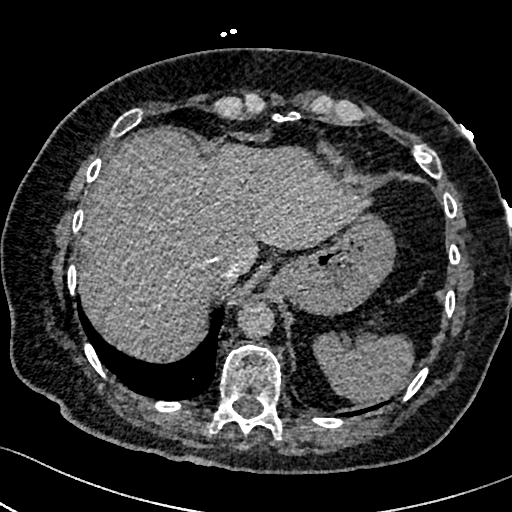
[im 76/529  lung]
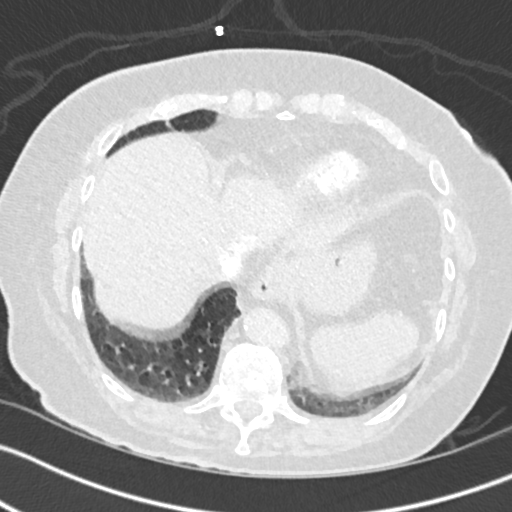
[im 101/529  mediastinal]
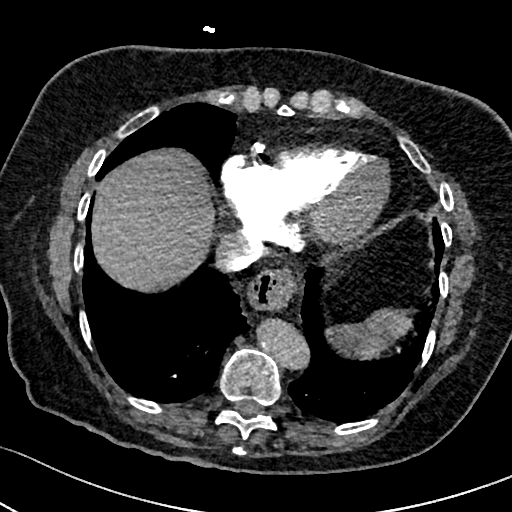
[im 151/529  lung]
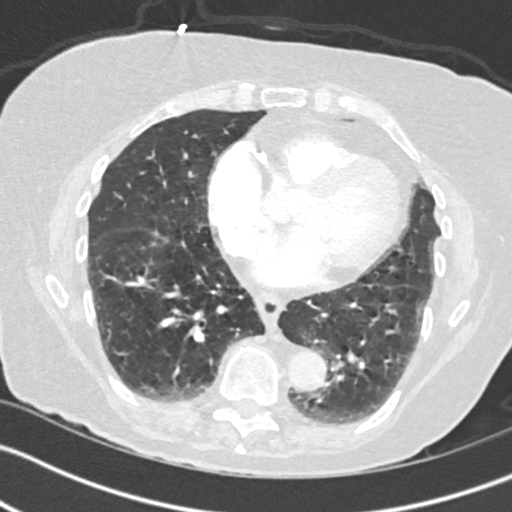
[im 177/529  mediastinal]
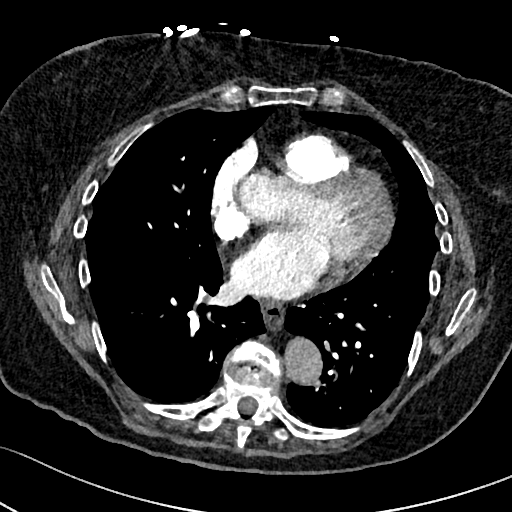
[im 202/529  lung]
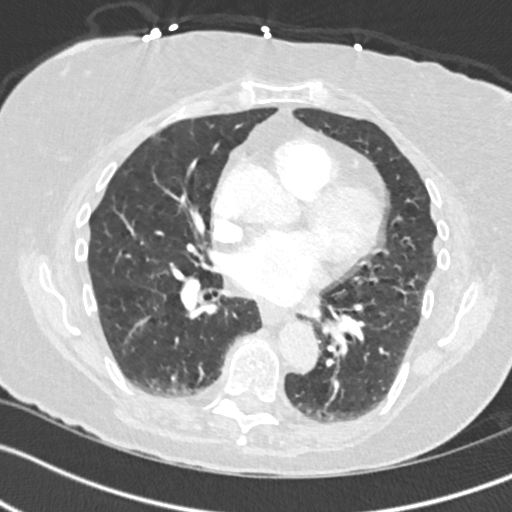
[im 227/529  mediastinal]
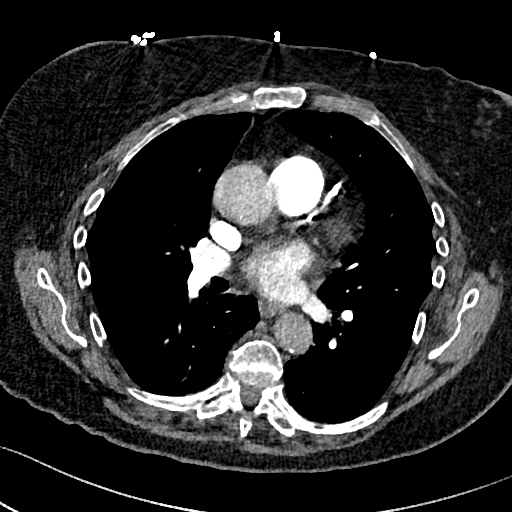
[im 252/529  lung]
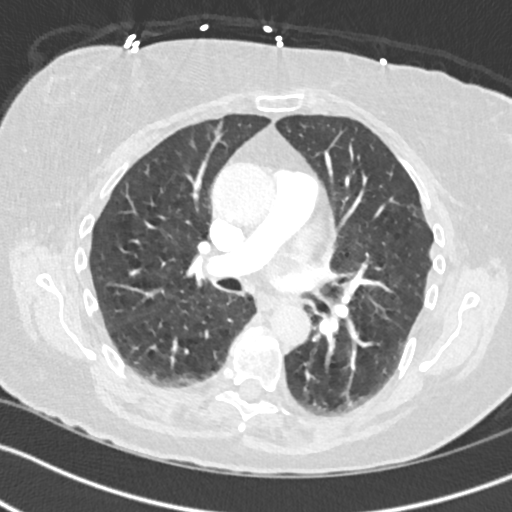
[im 277/529  mediastinal]
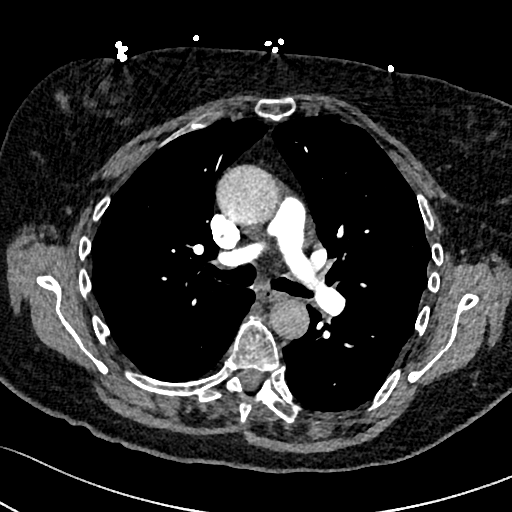
[im 302/529  lung]
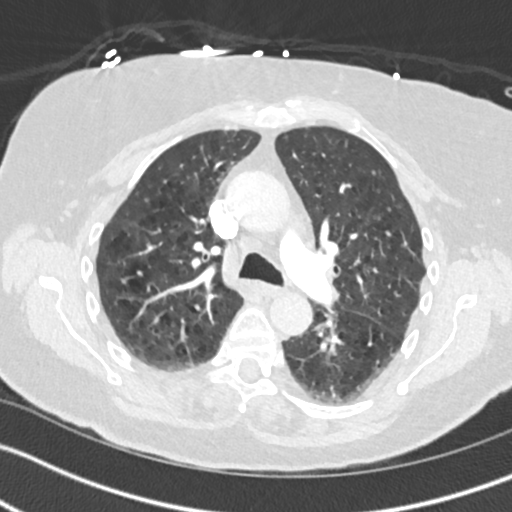
[im 327/529  mediastinal]
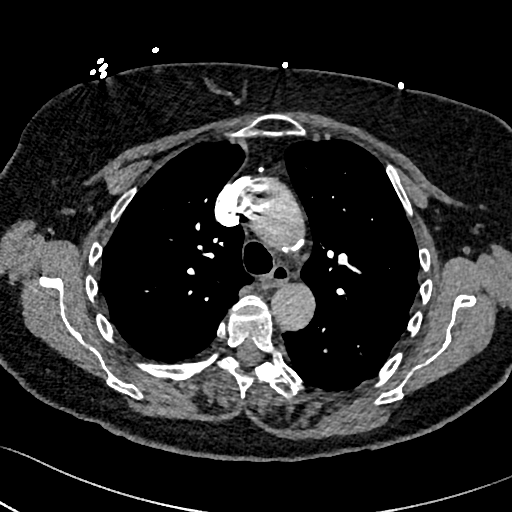
[im 353/529  lung]
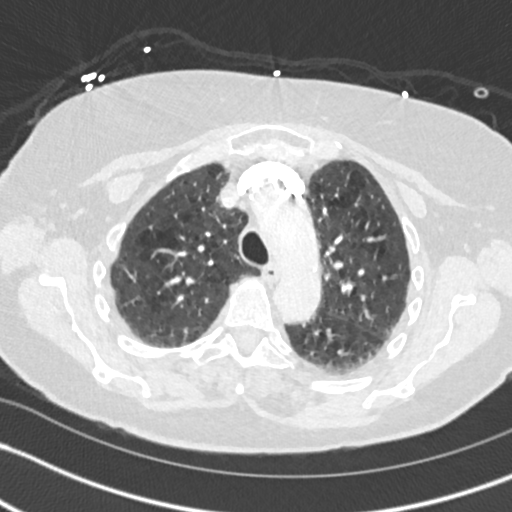
[im 403/529  mediastinal]
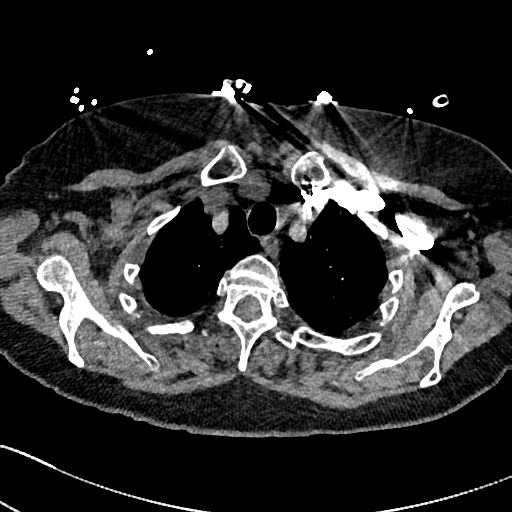
[im 428/529  lung]
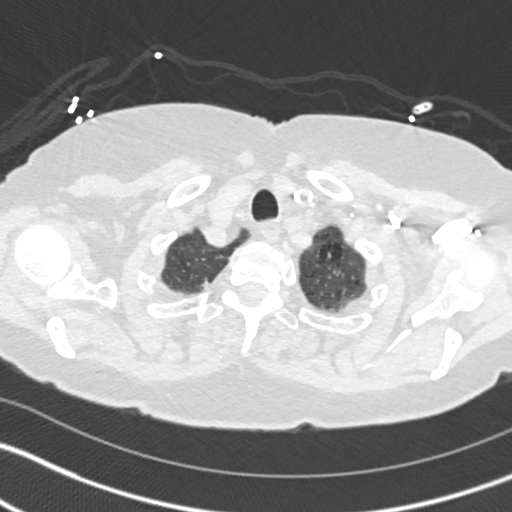
[im 453/529  mediastinal]
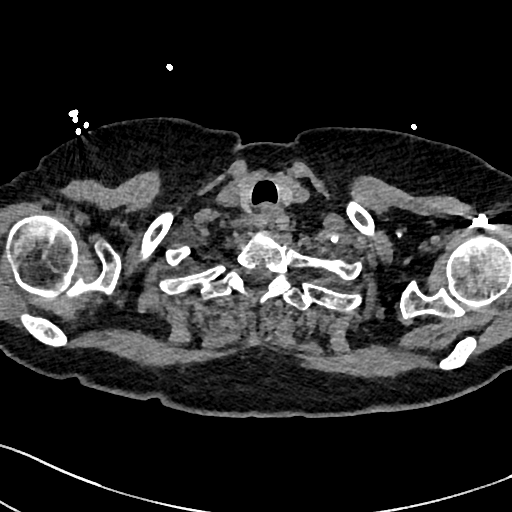
[im 478/529  lung]
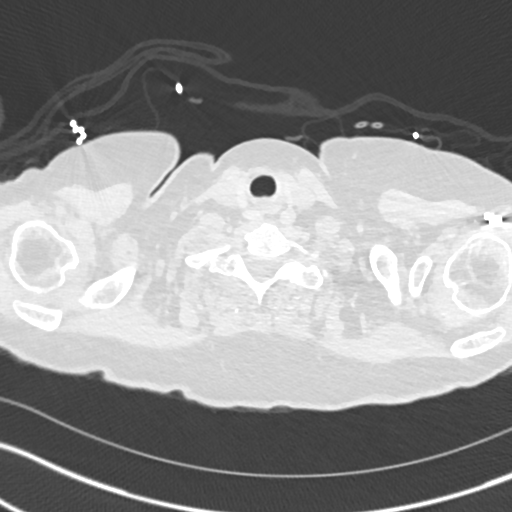
[im 503/529  mediastinal]
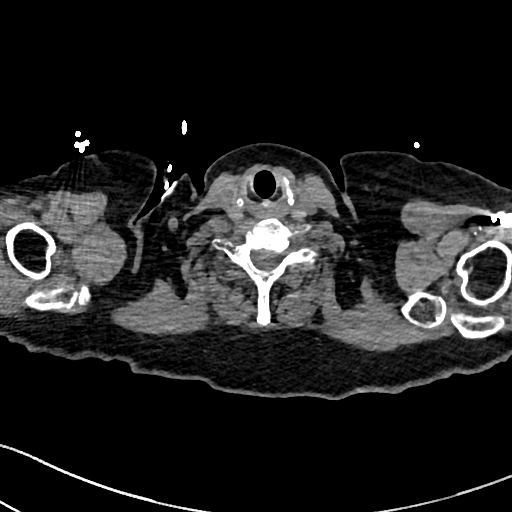

[Series 8: pe 2mm cor · coronal · 0.52mm/px · 1 of 128 slices shown]
[im 64/128  mediastinal]
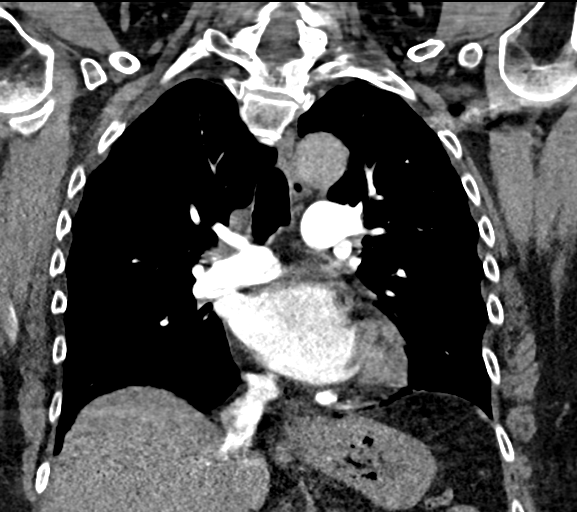

[19 of 36 positions shown; findings below may reference images not displayed]

FINDINGS: The lungs are well aerated bilaterally. Mild emphysematous changes
are noted bilaterally. No focal infiltrate or sizable effusion is
noted.

Thoracic aorta is within normal limits. The pulmonary artery
demonstrates a normal branching pattern without intraluminal filling
defect to suggest pulmonary embolism. No hilar or mediastinal
adenopathy is noted. Thoracic inlet is within normal limits.

The bony structures show no acute abnormality. A chronic appearing
compression deformity of T9 is noted.

Review of the MIP images confirms the above findings.
IMPRESSION: No acute abnormality noted.

## 2017-05-17 ENCOUNTER — Encounter: Payer: Self-pay | Admitting: Internal Medicine

## 2017-05-17 LAB — RESPIRATORY ALLERGY PROFILE REGION II ~~LOC~~

## 2017-05-17 LAB — INTERPRETATION:

## 2017-05-17 NOTE — Assessment & Plan Note (Addendum)
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 >  MM/ level 146  - 05/14/2017  Walked RA x 3 laps @ 185 ft each stopped due to  End of study, nl pace, no   desat - mild sob p 2nd lap    Symptoms are not reproducible and  Per hx are markedly disproportionate to objective findings and not clear this is actually much of a  lung problem but pt does appear to have difficult to sort out respiratory symptoms of unknown origin for which  DDX  = almost all start with A and  include Adherence, Ace Inhibitors, Acid Reflux, Active Sinus Disease, Alpha 1 Antitripsin deficiency, Anxiety masquerading as Airways dz,  ABPA,  Allergy(esp in young), Aspiration (esp in elderly), Adverse effects of meds,  Active smokers, A bunch of PE's (a small clot burden can't cause this syndrome unless there is already severe underlying pulm or vascular dz with poor reserve) plus two Bs  = Bronchiectasis and Beta blocker use..and one C= CHF   Adherence is always the initial "prime suspect" and is a multilayered concern that requires a "trust but verify" approach in every patient - starting with knowing how to use medications, especially inhalers, correctly, keeping up with refills and understanding the fundamental difference between maintenance and prns vs those medications only taken for a very short course and then stopped and not refilled.  - return with all meds in hand using a trust but verify approach to confirm accurate Medication  Reconciliation The principal here is that until we are certain that the  patients are doing what we've asked, it makes no sense to ask them to do more.   ? Acid (or non-acid) GERD > always difficult to exclude as up to 75% of pts in some series report no assoc GI/ Heartburn symptoms and "heaving" should probably count as reflux > rec max (24h)  acid suppression and diet restrictions/ reviewed and  instructions given in writing.   ? Allergy/ asthma > doubt s h/o cough or noct symptoms  though does have h/o rhinitis>>   Check profile, return for full pfts as prev rec  ? Active sinus dz > consider sinus CT though absence of noct or am flare of symptoms against this   ? Adverse drug effects > none listed   ? BB effects > not likely on such low doses of toprol  ? Anxiety /depression/ deconditioning > usually at the bottom of this list of usual suspects but should be much higher on this pt's based on H and P    ? Anemia/thyroid dz > ruled out today  ? chf > ruled out with bnp so low    I had an extended discussion with the patient reviewing all relevant studies completed to date and  lasting 25 minutes of a 40  minute acute office visit to re-establish    re  severe non-specific but potentially very serious refractory respiratory symptoms of uncertain and potentially multiple  etiologies.   Each maintenance medication was reviewed in detail including most importantly the difference between maintenance and prns and under what circumstances the prns are to be triggered using an action plan format that is not reflected in the computer generated alphabetically organized AVS.    Please see AVS for specific instructions unique to this office visit that I personally wrote and verbalized to  the the pt in detail and then reviewed with pt  by my nurse highlighting any changes in therapy/plan of care  recommended at today's visit.

## 2017-05-17 NOTE — Assessment & Plan Note (Signed)
  When respiratory symptoms begin or become refractory well after a patient reports complete smoking cessation,  Especially when this wasn't the case while they were smoking, a red flag is raised based on the work of Dr Primitivo GauzeFletcher which states:  if you quit smoking when your best day FEV1 is still well preserved it is highly unlikely you will progress to severe disease.  That is to say, once the smoking stops,  the symptoms should not suddenly erupt or markedly worsen.  If so, the differential diagnosis should include  obesity/deconditioning,  LPR/Reflux/Aspiration syndromes,  occult CHF, or  especially side effect of medications commonly used in this population.    Will ask again to return for full pfts

## 2017-05-18 NOTE — Progress Notes (Signed)
Spoke with pt and notified of results per Dr. Wert. Pt verbalized understanding and denied any questions. 

## 2017-06-11 ENCOUNTER — Ambulatory Visit (INDEPENDENT_AMBULATORY_CARE_PROVIDER_SITE_OTHER): Payer: Medicare Other | Admitting: Internal Medicine

## 2017-06-11 ENCOUNTER — Encounter: Payer: Self-pay | Admitting: Internal Medicine

## 2017-06-11 VITALS — BP 122/68 | HR 60 | Ht 65.5 in | Wt 165.0 lb

## 2017-06-11 DIAGNOSIS — J449 Chronic obstructive pulmonary disease, unspecified: Secondary | ICD-10-CM

## 2017-06-11 LAB — PULMONARY FUNCTION TEST
DL/VA % pred: 57 %
DL/VA: 2.84 ml/min/mmHg/L
DLCO cor % pred: 40 %
DLCO cor: 10.64 ml/min/mmHg
DLCO unc % pred: 40 %
DLCO unc: 10.74 ml/min/mmHg
FEF 25-75 Post: 0.77 L/sec
FEF 25-75 Pre: 0.56 L/sec
FEF2575-%Change-Post: 36 %
FEF2575-%Pred-Post: 47 %
FEF2575-%Pred-Pre: 35 %
FEV1-%Change-Post: 11 %
FEV1-%Pred-Post: 54 %
FEV1-%Pred-Pre: 48 %
FEV1-Post: 1.17 L
FEV1-Pre: 1.06 L
FEV1FVC-%Change-Post: -6 %
FEV1FVC-%Pred-Pre: 82 %
FEV6-%Change-Post: 18 %
FEV6-%Pred-Post: 73 %
FEV6-%Pred-Pre: 61 %
FEV6-Post: 2.02 L
FEV6-Pre: 1.7 L
FEV6FVC-%Change-Post: 0 %
FEV6FVC-%Pred-Post: 104 %
FEV6FVC-%Pred-Pre: 105 %
FVC-%Change-Post: 18 %
FVC-%Pred-Post: 70 %
FVC-%Pred-Pre: 59 %
FVC-Post: 2.04 L
FVC-Pre: 1.72 L
Post FEV1/FVC ratio: 57 %
Post FEV6/FVC ratio: 99 %
Pre FEV1/FVC ratio: 61 %
Pre FEV6/FVC Ratio: 100 %

## 2017-06-11 MED ORDER — GLYCOPYRROLATE-FORMOTEROL 9-4.8 MCG/ACT IN AERO: 2.0000 | INHALATION_SPRAY | Freq: Two times a day (BID) | RESPIRATORY_TRACT | 0 refills | Status: DC

## 2017-06-11 MED ORDER — GLYCOPYRROLATE-FORMOTEROL 9-4.8 MCG/ACT IN AERO: 2.0000 | INHALATION_SPRAY | Freq: Two times a day (BID) | RESPIRATORY_TRACT | 11 refills | Status: DC

## 2017-06-11 NOTE — Progress Notes (Signed)
Subjective:    Patient ID: [name], female    DOB: [birth year]     MRN: [id number]    Brief patient profile:  28 yowf   MM  quit smoking in 1986 with am throat congestion and felt fine until around 2012 with onset of doe so referred to pulmonary clinic 08/07/2015 by Dr Elmore Guise with GOLD II criteria  06/11/2017     History of Present Illness  08/07/2015 1st [facility] office visit/ Odean Mcelwain   Chief Complaint  Patient presents with  . Pulmonary Consult    Referred by Dr. [name]. Pt c/o SOB x 5 yrs- worse x 4 months. She is SOB with grocery shopping or lifting things. She also c/o occ PND and am cough- yellow to clear sputum.   indolent onset doe x 5 years  To point where has trouble carrying groceries from car to house and up x 3 steps and no real change on dulera or symbicort to date - thought advair workded better. Breathing Ok at hs and in am Does fine if rests or paces  Problems with doe also while vacuuming for more than 5 min- note Sometimes gags and heaves during coughing fits she attributes to pnds , esp in am but otherwise no excess/ purulent sputum or mucus plugs   RECS Add pepcid 20 mg at bedtime to the protonix before bfast Stop symbicort but if breathing worse restart except try no to take it am of test  GERD  Diet   Please schedule a follow up office visit in 6 weeks, call sooner if needed with pfts ok to push back a few weeks if needed > did not return as req     05/14/2017 acute extended ov/Lacosta Hargan re: sob/ cough full  pfts never done  Chief Complaint  Patient presents with  . Acute Visit    Increased SOB since end of Dec 2018. She gets SOB with exertion such as carrying her groceries in. She also c/o PND and occ cough with clear sputum.    prior to end Dec 2018 since last visit on no inhalers / on ppi before bfast s h2 hs > cough not better but able to do water aerobics twice weekly x one hour  Gagging and heaving / worse with brush teeth  since end of dec grad  worse Doe to point of = MMRC3 = can't walk 100 yards even at a slow pace at a flat grade s stopping due to sob  And reported "wheezing" with exertion (niether symptom reproduced on today's walk)  Sleeps flat ok rec Add pepcid 20 mg at bedtime GERD diet    06/11/2017  f/u ov/Jaysie Benthall re: GOLD II COPD/   cough better  Chief Complaint  Patient presents with  . Follow-up    PFT's today, dyspnea on exertion   Dyspnea:    MMRC2 = can't walk a nl pace on a flat grade s sob but does fine slow and flat eg shopping at [employer] leaning on cart  Cough: ?  Little better  Sleep: ok  SABA use: none   No obvious day to day or daytime variability or assoc excess/ purulent sputum or mucus plugs or hemoptysis or cp or chest tightness, subjective wheeze or overt sinus or hb symptoms. No unusual exposure hx or h/o childhood pna/ asthma or knowledge of premature birth.  Sleeping ok flat without nocturnal  or early am exacerbation  of respiratory  c/o's or need for noct saba.  Also denies any obvious fluctuation of symptoms with weather or environmental changes or other aggravating or alleviating factors except as outlined above   Current Allergies, Complete Past Medical History, Past Surgical History, Family History, and Social History were reviewed in [facility] electronic medical record.  ROS  The following are not active complaints unless bolded Hoarseness, sore throat, dysphagia, dental problems, itching, sneezing,  nasal congestion or discharge of excess mucus or purulent secretions, ear ache,   fever, chills, sweats, unintended wt loss or wt gain, classically pleuritic or exertional cp,  orthopnea pnd or leg swelling, presyncope, palpitations, abdominal pain, anorexia, nausea, vomiting, diarrhea  or change in bowel habits or change in bladder habits, change in stools or change in urine, dysuria, hematuria,  rash, arthralgias, visual complaints, headache, numbness, weakness or ataxia or problems with walking or  coordination,  change in mood/affect or memory.        Current Meds  Medication Sig  . aspirin EC 81 MG tablet Take 1 tablet (81 mg total) daily by mouth.  . calcium carbonate (OS-CAL) 600 MG TABS tablet Take 600 mg by mouth every evening.   . Cholecalciferol (VITAMIN D3) 1000 units CAPS Take 1,000 Units by mouth daily.  . famotidine (PEPCID) 20 MG tablet One at bedtime  . loratadine (CLARITIN) 10 MG tablet Take 10 mg by mouth daily as needed.   Marland Kitchen. losartan (COZAAR) 50 MG tablet Take 1 tablet (50 mg total) daily by mouth.  . metoprolol succinate (TOPROL-XL) 25 MG 24 hr tablet Take 1 tablet by mouth daily.  . nitroGLYCERIN (NITROSTAT) 0.4 MG SL tablet Place 1 tablet (0.4 mg total) under the tongue every 5 (five) minutes as needed. For chest pain  . pantoprazole (PROTONIX) 40 MG tablet Take 1 tablet by mouth daily.  . rosuvastatin (CRESTOR) 20 MG tablet Take 1 tablet (20 mg total) by mouth at bedtime.  . traMADol (ULTRAM) 50 MG tablet Take 50 mg by mouth every 6 (six) hours as needed.  . Zoledronic Acid (RECLAST IV) Inject into the vein. Once Yearly                            Objective:   Physical Exam  amb wf nad   06/11/2017         165  05/14/2017         163   08/07/15 164 lb (74.39 kg)  06/11/15 160 lb (72.576 kg)  05/09/15 160 lb 3 oz (72.661 kg)      Vital signs reviewed - Note on arrival 02 sats  99% on RA              HEENT: nl  turbinates bilaterally, and oropharynx. Nl external ear canals without cough reflex - upper dentures   NECK :  without JVD/Nodes/TM/ nl carotid upstrokes bilaterally   LUNGS: no acc muscle use,  Nl contour chest with minimal exp rhonchi bilaterally    CV:  RRR  no s3 or murmur or increase in P2, and no edema   ABD:  soft and nontender with nl inspiratory excursion in the supine position. No bruits or organomegaly appreciated, bowel sounds nl  MS:  Nl gait/ ext warm without deformities, calf tenderness, cyanosis or clubbing No  obvious joint restrictions   SKIN: warm and dry without lesions    NEURO:  alert, approp, nl sensorium with  no motor or cerebellar deficits apparent.     CXR PA  and Lateral:   05/14/2017 :    I personally reviewed images and agree with radiology impression as follows:   No acute cardiopulmonary disease.Stable changes of chronic bronchitis and/or asthma.                 Assessment & Plan:

## 2017-06-11 NOTE — Patient Instructions (Addendum)
Bevespi Take 2 puffs first thing in am and then another 2 puffs about 12 hours later.    Work on inhaler technique:  relax and gently blow all the way out then take a nice smooth deep breath back in, triggering the inhaler at same time you start breathing in.  Hold for up to 5 seconds if you can.  . Rinse and gargle with water when done.  Please schedule a follow up visit in 3 months but call sooner if needed - call in meantime if you want us to refer you to rehab

## 2017-06-11 NOTE — Progress Notes (Signed)
PFT done today. 

## 2017-06-12 ENCOUNTER — Telehealth: Payer: Self-pay | Admitting: Pulmonary Disease

## 2017-06-12 ENCOUNTER — Encounter: Payer: Self-pay | Admitting: Internal Medicine

## 2017-06-12 NOTE — Telephone Encounter (Signed)
Called to say she was started on bevespi yesterday. Reports "feeling weird", chest pressure, increased frequency of urination. I asked her to stop the inhaler. If symptoms continue then call back or PCP to get UA. Will CC. Dr Sherene SiresWert to see if she needs any other inhaler.  Chilton GreathousePraveen Anneta Rounds MD [facility] Pager [phone] If no answer or after 3pm call: [phone] 06/12/2017, 12:34 PM

## 2017-06-12 NOTE — Assessment & Plan Note (Signed)
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 >  MM/ level 146  - 05/14/2017  Walked RA x 3 laps @ 185 ft each stopped due to  End of study, nl pace, no   desat - mild sob p 2nd lap   - Allergy profile 05/14/17  >  Eos 1.1% /  IgE  92 RAST neg  - PFT's  06/11/2017  FEV1 1.17 (54 % ) ratio 57  p 11 % improvement from saba p nothing prior to study with DLCO  40/40c % corrects to 57  % for alv volume  - 06/11/2017  After extensive coaching inhaler device  effectiveness =    75% try bevespi     Pt is Group B in terms of symptom/risk and laba/lama therefore appropriate rx at this point and she is a good candidate for rehab but wants to try bevespi and see how it goes first.  Reviewed:  Formulary restrictions will be an ongoing challenge for the forseable future and I would be happy to pick an alternative if the pt will first  provide me a list of them but pt  will need to return here for training for any new device that is required eg dpi vs hfa vs respimat.   She is familiar with hfa so will try that first here.  In meantime we can always provide samples so the patient never runs out of any needed respiratory medications.   I had an extended discussion with the patient reviewing all relevant studies completed to date and  lasting 15 to 20 minutes of a 25 minute visit    Each maintenance medication was reviewed in detail including most importantly the difference between maintenance and prns and under what circumstances the prns are to be triggered using an action plan format that is not reflected in the computer generated alphabetically organized AVS.    Please see AVS for specific instructions unique to this visit that I personally wrote and verbalized to the the pt in detail and then reviewed with pt  by my nurse highlighting any  changes in therapy recommended at  today's visit to their plan of care.

## 2017-06-13 NOTE — Telephone Encounter (Signed)
No great options at this point, would go ahead with rehab referral and hold off further trials over the phone but set up f/u next available to go over the limited options for a different inhaler

## 2017-06-14 NOTE — Telephone Encounter (Signed)
Patient has been scheduled for 3/28 nothing further needed.

## 2017-07-01 ENCOUNTER — Ambulatory Visit (INDEPENDENT_AMBULATORY_CARE_PROVIDER_SITE_OTHER): Payer: Medicare Other | Admitting: Internal Medicine

## 2017-07-01 ENCOUNTER — Encounter: Payer: Self-pay | Admitting: Internal Medicine

## 2017-07-01 VITALS — BP 112/66 | HR 67 | Ht 65.5 in | Wt 167.4 lb

## 2017-07-01 DIAGNOSIS — J449 Chronic obstructive pulmonary disease, unspecified: Secondary | ICD-10-CM | POA: Diagnosis not present

## 2017-07-01 MED ORDER — GLYCOPYRROLATE-FORMOTEROL 9-4.8 MCG/ACT IN AERO: 2.0000 | INHALATION_SPRAY | Freq: Two times a day (BID) | RESPIRATORY_TRACT | 0 refills | Status: DC

## 2017-07-01 NOTE — Progress Notes (Signed)
Subjective:    Patient ID: [name], female    DOB: 03/02/1941     MRN: [id number]    Brief patient profile:  74 yowf   MM  quit smoking in 1986 with am throat congestion and felt fine until around 2012 with onset of doe so referred to pulmonary clinic 08/07/2015 by Dr Elmore Guise with GOLD II criteria  06/11/2017     History of Present Illness  08/07/2015 1st [facility] office visit/ [name]   Chief Complaint  Patient presents with  . Pulmonary Consult    Referred by Dr. [name]. Pt c/o SOB x 5 yrs- worse x 4 months. She is SOB with grocery shopping or lifting things. She also c/o occ PND and am cough- yellow to clear sputum.   indolent onset doe x 5 years  To point where has trouble carrying groceries from car to house and up x 3 steps and no real change on dulera or symbicort to date - thought advair workded better. Breathing Ok at hs and in am Does fine if rests or paces  Problems with doe also while vacuuming for more than 5 min- note Sometimes gags and heaves during coughing fits she attributes to pnds , esp in am but otherwise no excess/ purulent sputum or mucus plugs   RECS Add pepcid 20 mg at bedtime to the protonix before bfast Stop symbicort but if breathing worse restart except try no to take it am of test  GERD  Diet   Please schedule a follow up office visit in 6 weeks, call sooner if needed with pfts ok to push back a few weeks if needed > did not return as req     05/14/2017 acute extended ov/Elman Dettman re: sob/ cough full  pfts never done  Chief Complaint  Patient presents with  . Acute Visit    Increased SOB since end of Dec 2018. She gets SOB with exertion such as carrying her groceries in. She also c/o PND and occ cough with clear sputum.    prior to end Dec 2018 since last visit on no inhalers / on ppi before bfast s h2 hs > cough not better but able to do water aerobics twice weekly x one hour  Gagging and heaving / worse with brush teeth  since end of dec grad  worse Doe to point of = MMRC3 = can't walk 100 yards even at a slow pace at a flat grade s stopping due to sob  And reported "wheezing" with exertion (niether symptom reproduced on today's walk)  Sleeps flat ok rec Add pepcid 20 mg at bedtime GERD diet    06/11/2017  f/u ov/[name] re: GOLD II COPD/   cough better  Chief Complaint  Patient presents with  . Follow-up    PFT's today, dyspnea on exertion   Dyspnea:    MMRC2 = can't walk a nl pace on a flat grade s sob but does fine slow and flat eg shopping at [employer] leaning on cart  Cough: ?  Little better  Sleep: ok  SABA use: none  rec Bevespi Take 2 puffs first thing in am and then another 2 puffs about 12 hours later.  Work on inhaler technique   07/01/2017  f/u ov/[name] re: GOLD II copd/  Chief Complaint  Patient presents with  . Follow-up    Breathing has improved some. She has no new co's. She does not have a recue inhaler.    Dyspnea:  MMRC2 = can't walk a nl pace on a flat grade s sob but does fine slow and flat   Cough: no problem Sleep: ok flate  SABA use:  None  No obvious day to day or daytime variability or assoc excess/ purulent sputum or mucus plugs or hemoptysis or cp or chest tightness, subjective wheeze or overt sinus or hb symptoms. No unusual exposure hx or h/o childhood pna/ asthma or knowledge of premature birth.  Sleeping ok flat without nocturnal  or early am exacerbation  of respiratory  c/o's or need for noct saba. Also denies any obvious fluctuation of symptoms with weather or environmental changes or other aggravating or alleviating factors except as outlined above   Current Allergies, Complete Past Medical History, Past Surgical History, Family History, and Social History were reviewed in [facility] electronic medical record.  ROS  The following are not active complaints unless bolded Hoarseness, sore throat, dysphagia, dental problems, itching, sneezing,  nasal congestion or discharge of excess mucus or  purulent secretions, ear ache,   fever, chills, sweats, unintended wt loss or wt gain, classically pleuritic or exertional cp,  orthopnea pnd or leg swelling, presyncope, palpitations, abdominal pain, anorexia, nausea, vomiting, diarrhea  or change in bowel habits or change in bladder habits, change in stools or change in urine, dysuria, hematuria,  rash, arthralgias, visual complaints, headache, numbness, weakness or ataxia or problems with walking or coordination,  change in mood/affect or memory.        Current Meds  Medication Sig  . aspirin EC 81 MG tablet Take 1 tablet (81 mg total) daily by mouth.  . calcium carbonate (OS-CAL) 600 MG TABS tablet Take 600 mg by mouth every evening.   . Cholecalciferol (VITAMIN D3) 1000 units CAPS Take 1,000 Units by mouth daily.  . Glycopyrrolate-Formoterol (BEVESPI AEROSPHERE) 9-4.8 MCG/ACT AERO Inhale 2 puffs into the lungs 2 (two) times daily.  Marland Kitchen. loratadine (CLARITIN) 10 MG tablet Take 10 mg by mouth daily as needed.   Marland Kitchen. losartan (COZAAR) 50 MG tablet Take 1 tablet (50 mg total) daily by mouth.  . metoprolol succinate (TOPROL-XL) 25 MG 24 hr tablet Take 1 tablet by mouth daily.  . nitroGLYCERIN (NITROSTAT) 0.4 MG SL tablet Place 1 tablet (0.4 mg total) under the tongue every 5 (five) minutes as needed. For chest pain  . pantoprazole (PROTONIX) 40 MG tablet Take 1 tablet by mouth daily.  . rosuvastatin (CRESTOR) 20 MG tablet Take 1 tablet (20 mg total) by mouth at bedtime.  . traMADol (ULTRAM) 50 MG tablet Take 50 mg by mouth every 6 (six) hours as needed.  . Zoledronic Acid (RECLAST IV) Inject into the vein. Once Yearly                          Objective:   Physical Exam  amb wf nad    07/01/2017        167  06/11/2017         165  05/14/2017         163   08/07/15 164 lb (74.39 kg)  06/11/15 160 lb (72.576 kg)  05/09/15 160 lb 3 oz (72.661 kg)      Vital signs reviewed - Note on arrival 02 sats  93% on RA          HEENT: nl  dentition / oropharynx. Nl external ear canals without cough reflex - mild bilateral non-specific turbinate edema  / top dentures  NECK :  without JVD/Nodes/TM/ nl carotid upstrokes bilaterally   LUNGS: no acc muscle use,  Mod barrel  contour chest wall with dorsal kyposis and bilateral  Distant bs s audible wheeze and  without cough on insp or exp maneuver and mod   Hyperresonant  to  percussion bilaterally     CV:  RRR  no s3 or murmur or increase in P2, and no edema   ABD:  soft and nontender with pos mid insp Hoover's  in the supine position. No bruits or organomegaly appreciated, bowel sounds nl  MS:  Slow steady gait/  ext warm without deformities, calf tenderness, cyanosis or clubbing No obvious joint restrictions   SKIN: warm and dry without lesions    NEURO:  alert, approp, nl sensorium with  no motor or cerebellar deficits apparent.                            Assessment & Plan:

## 2017-07-01 NOTE — Patient Instructions (Signed)
Please see patient coordinator before you leave today  to schedule refer to rehab   Please schedule a follow up visit in 6 months but call sooner if needed

## 2017-07-02 ENCOUNTER — Telehealth (HOSPITAL_COMMUNITY): Payer: Self-pay

## 2017-07-02 NOTE — Telephone Encounter (Signed)
Referral received and passed to RN for review.

## 2017-07-04 ENCOUNTER — Encounter: Payer: Self-pay | Admitting: Internal Medicine

## 2017-07-04 NOTE — Assessment & Plan Note (Signed)
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 >  MM/ level 146  - 05/14/2017  Walked RA x 3 laps @ 185 ft each stopped due to  End of study, nl pace, no   desat - mild sob p 2nd lap   - Allergy profile 05/14/17  >  Eos 1.1% /  IgE  92 RAST neg - PFT's  06/11/2017  FEV1 1.17 (54 % ) ratio 57  p 11 % improvement from saba p nothing prior to study with DLCO  40/40c % corrects to 57  % for alv volume  - 06/11/2017  After extensive coaching inhaler device  effectiveness =    75% try bevespi  > some better  - 07/01/2017 refer to rehab/ f/u q 6 m  Pt continues = Pt is Group B in terms of symptom/risk and laba/lama therefore appropriate rx at this point so bevespi good choice as long as affordable on her plan   Next step is rehab > agrees to go and f/u her can be q 6 m now, sooner if any flares   Each maintenance medication was reviewed in detail including most importantly the difference between maintenance and as needed and under what circumstances the prns are to be used.  Please see AVS for specific  Instructions which are unique to this visit and I personally typed out  which were reviewed in detail in writing with the patient and a copy provided.

## 2017-07-06 ENCOUNTER — Telehealth (HOSPITAL_COMMUNITY): Payer: Self-pay

## 2017-07-06 NOTE — Telephone Encounter (Signed)
Attempted to call patient in regards to Pulmonary Rehab - lm on vm °

## 2017-07-06 NOTE — Telephone Encounter (Signed)
Patient returned phone call and stated she is interested in the Pulmonary Rehab program. Scheduled orientation on 08/09/2017 at 9:15am. Patient will attend the 10:30am exc class. Went over insurance with patient and patient verbally stated she understands. Mailed packet.

## 2017-07-06 NOTE — Telephone Encounter (Signed)
Patients insurance is active through Medicare A/B - Patient responsibility is 20%.  [facility] - Covers what Medicare does not. Patient is covered at 100%.

## 2017-07-09 NOTE — Progress Notes (Signed)
Office Visit Note  Patient: [name]             Date of Birth: 1940-12-12           MRN: [id number]             PCP: Burton Apley, MD Referring: Burton Apley, MD Visit Date: 07/22/2017 Occupation: @GUAROCC @    Subjective:  Hand pain   History of Present Illness: [name] is a 77 y.o. female with history of osteoporosis, osteoarthritis, and DDD.  Patient states that she has been off of Reclast for the last 2 years.  She has been taking calcium and vitamin D daily.  She denies any recent falls.  She denies any significant back pain at this time.  She states she has occasional discomfort in her bilateral knees, worse in her right.  She denies any joint swelling.  She states that her bilateral hands cause discomfort but no swelling.  She says she has some stiffness in her hands.  She states that her lower back pain has improved significantly.  She states that she does take tramadol at bedtime for pain relief if she is having increased pain.    Activities of Daily Living:  Patient reports morning stiffness for 0 minutes.   Patient Denies nocturnal pain.  Difficulty dressing/grooming: Denies Difficulty climbing stairs: Reports Difficulty getting out of chair: Reports Difficulty using hands for taps, buttons, cutlery, and/or writing: Reports   Review of Systems  Constitutional: Positive for fatigue.  HENT: Negative for mouth sores, mouth dryness and nose dryness.   Eyes: Negative for pain, visual disturbance and dryness.  Respiratory: Positive for shortness of breath (Hx of COPD, SOB w/ exertion). Negative for cough, hemoptysis and difficulty breathing.   Cardiovascular: Negative for chest pain, palpitations, hypertension and swelling in legs/feet.  Gastrointestinal: Negative for blood in stool, constipation and diarrhea.  Endocrine: Negative for increased urination.  Genitourinary: Negative for painful urination.  Musculoskeletal: Positive for arthralgias and joint pain.  Negative for joint swelling, myalgias, muscle weakness, morning stiffness, muscle tenderness and myalgias.  Skin: Negative for color change, pallor, rash, hair loss, nodules/bumps, skin tightness, ulcers and sensitivity to sunlight.  Allergic/Immunologic: Negative for susceptible to infections.  Neurological: Negative for dizziness, numbness, headaches and weakness.  Hematological: Negative for swollen glands.  Psychiatric/Behavioral: Negative for depressed mood and sleep disturbance. The patient is nervous/anxious.     PMFS History:  Patient Active Problem List   Diagnosis Date Noted  . TIA (transient ischemic attack) 03/18/2016  . Right sided weakness   . Stroke (HCC) 03/16/2016  . Stroke-like symptoms   . History of placement of stent in LAD coronary artery   . Dyspnea on exertion 08/08/2015  . Preoperative clearance 05/09/2015  . Atypical chest pain 03/21/2015  . Acute UTI 03/21/2015  . Hydronephrosis, right 03/21/2015  . Proteinuria 03/21/2015  . HTN (hypertension) 03/21/2015  . Chronic diastolic heart failure (HCC) 03/21/2015  . 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 Unstable angina (HCC) 11/16/2012  . Palpitations 07/22/2010  . COPD GOLD II 05/30/2010  . Hyperlipidemia 08/27/2009  . AMI 08/27/2009  . CAD (coronary artery disease) 08/27/2009  . Ventricular fibrillation (HCC) 08/14/2009  . COMPRESSION FRACTURE, LUMBAR VERTEBRAE 12/21/2008  . SPINAL STENOSIS, LUMBAR 08/31/2008  . Macrocytic anemia 08/30/2008  . ABDOMINAL MASS 07/11/2008  . Shortness of breath 07/10/2008  . UNSPECIFIED VITAMIN D DEFICIENCY 03/09/2008  . GERD 03/09/2008  . OSTEOARTHRITIS 11/21/2007  . OSTEOPENIA 11/21/2007    Past  Medical History:  Diagnosis Date  . Compression fracture    T9  . COPD (chronic obstructive pulmonary disease) (HCC)   . Coronary artery disease 08/14/2009   a. 08/2009: Ant MI c/b v-fib arrest, s/p PTCA/BMS to LAD. (STENT Placement); b. 2013 Myoview: no  ischemia; c. 11/2012 Cath: patent mLAD stent, RI 70 (stable), otw nonobs dzs, EF 65%;  d. 07/2016 Lexiscan MV: EF 56%, no ischemia/infarct.  Marland Kitchen. GERD (gastroesophageal reflux disease)   . HTN (hypertension)   . Hyperlipidemia   . Ischemic cardiomyopathy    a. EF 40-45% in 08/2009 at time of MI. b. Improved to 55-60% by June 2011; c. 03/2016 Echo: EF 55-60%, no rwma, Gr1 DD, mild LVH.  . Osteoarthritis   . Osteopenia   . PONV (postoperative nausea and vomiting)   . Postoperative groin pseudoaneurysm (HCC)    a. 04/2010 following diagnostic cardiac cath, s/p compression.  . Rib fractures    left sided second and sixth rib   . Shingles   . TIA (transient ischemic attack)    a. 03/2016 MRI/MRA negative/unremarkable.  . Ventricular fibrillation (HCC)    a. 08/2009: due to anterior MI.    Family History  Problem Relation Age of Onset  . Rheum arthritis Sister    Past Surgical History:  Procedure Laterality Date  . APPENDECTOMY    . BACK SURGERY    . LEFT HEART CATHETERIZATION WITH CORONARY ANGIOGRAM N/A 11/16/2012   Procedure: LEFT HEART CATHETERIZATION WITH CORONARY ANGIOGRAM;  Surgeon: Kathleene Hazelhristopher D McAlhany, MD;  [facility];  Service: Cardiovascular;  Laterality: N/A;  . LEG SURGERY     R low  . ORIF HIP FRACTURE    . TONSILLECTOMY     Social History   Social History Narrative  . Not on file     Objective: Vital Signs: BP 110/68 (BP Location: Left Arm, Patient Position: Sitting, Cuff Size: Normal)   Pulse 62   Resp 13   Ht 5' 5.5" (1.664 m)   Wt 164 lb (74.4 kg)   BMI 26.88 kg/m    Physical Exam  Constitutional: She is oriented to person, place, and time. She appears well-developed and well-nourished.  HENT:  Head: Normocephalic and atraumatic.  Eyes: Conjunctivae and EOM are normal.  Neck: Normal range of motion.  Cardiovascular: Normal rate, regular rhythm, normal heart sounds and intact distal pulses.  Pulmonary/Chest: Effort normal and breath sounds  normal.  Abdominal: Soft. Bowel sounds are normal.  Lymphadenopathy:    She has no cervical adenopathy.  Neurological: She is alert and oriented to person, place, and time.  Skin: Skin is warm and dry. Capillary refill takes less than 2 seconds.  Psychiatric: She has a normal mood and affect. Her behavior is normal.  Nursing note and vitals reviewed.    Musculoskeletal Exam: C-spine good range of motion.  Thoracic kyphosis.  Shoulder joints, elbow joints, wrist joints, MCPs, PIPs, DIPs good range of motion with no synovitis.  PIP and DIP synovial thickening consistent with osteoarthritis.  She has bilateral CMC joint synovial thickening.  Hip joints, knee joints, ankle joints, MTPs, PIPs, DIPs good range of motion with no synovitis.  PIP and DIP synovial thickening consistent with osteoarthritis of her feet.  No warmth or effusion of bilateral knees.  No tenderness of trochanteric bursa bilaterally.  CDAI Exam: No CDAI exam completed.    Investigation: No additional findings. CBC Latest Ref Rng & Units 05/14/2017 03/16/2016 03/16/2016  WBC 3.8 - 10.8 Thousand/uL 6.2 -  8.1  Hemoglobin 11.7 - 15.5 g/dL 47.8 29.5 62.1  Hematocrit 35.0 - 45.0 % 39.8 40.0 37.3  Platelets 140 - 400 Thousand/uL 315 - 278   CMP Latest Ref Rng & Units 05/14/2017 03/16/2016 03/16/2016  Glucose 70 - 99 mg/dL 308(M) 97 578(I)  BUN 6 - 23 mg/dL 13 12 7   Creatinine 0.40 - 1.20 mg/dL 6.96 2.95 2.84  Sodium 135 - 145 mEq/L 134(L) 134(L) 134(L)  Potassium 3.5 - 5.1 mEq/L 3.7 4.5 4.5  Chloride 96 - 112 mEq/L 96 95(L) 98(L)  CO2 19 - 32 mEq/L 32 - 28  Calcium 8.4 - 10.5 mg/dL 9.2 - 9.0  Total Protein 6.5 - 8.1 g/dL - - 6.6  Total Bilirubin 0.3 - 1.2 mg/dL - - 0.7  Alkaline Phos 38 - 126 U/L - - 56  AST 15 - 41 U/L - - 24  ALT 14 - 54 U/L - - 18    Imaging: No results found.  Speciality Comments: No specialty comments available.    Procedures:  No procedures performed Allergies: Codeine; Imdur [isosorbide  dinitrate]; Other; and Sulfonamide derivatives   Assessment / Plan:     Visit Diagnoses: Age-related osteoporosis without current pathological fracture - tx w/ Forteo 2012-2014-hx of vertebral fx, height loss of 4 inches, thoracic kyphosis.T-score -4.2 03/02/17 DEXA.  Patient has been off of Reclast for the past 2 years.  She has been taking calcium and vitamin D supplements daily.  She has no midline spinal tenderness or recent falls.  DDD (degenerative disc disease), lumbar - s/p fusion: No midline spinal tenderness.  She has no discomfort at this time.  Primary osteoarthritis of both hands: She has PIP and DIP synovial thickening consistent with osteoarthritis.  She has bilateral CMC joint synovial thickening.  Joint protection muscle strengthening were discussed.  She was given a handout of hand exercises that she can perform at home.  Primary osteoarthritis of right knee: No warmth or effusion.  She has good range of motion on exam.  She experiences occasional discomfort in her right knee.   Orders: Orders Placed This Encounter  Procedures  . COMPLETE METABOLIC PANEL WITH GFR  . VITAMIN D 25 Hydroxy (Vit-D Deficiency, Fractures)   No orders of the defined types were placed in this encounter.   Face-to-face time spent with patient was 30 minutes.> 50% of time was spent in counseling and coordination of care.  Follow-Up Instructions: Return in about 6 months (around 01/21/2018) for Osteoporosis, Osteoarthritis, DDD.   Sherron Ales PA-C  I examined and evaluated the patient with Sherron Ales PA.  I had detailed discussion with patient regarding osteoporosis.  We discussed the benefit and risk of Reclast.  She has taken Forteo which will be only one time in her lifetime she can take.  She states she missed Reclast infusion last time as she had shingles.  After discussing indication side effects contraindications she was willing to proceed with Reclast.  I will obtain a CMP today.  Along  with vitamin D level.  She will be scheduled for Reclast within a month.  And then she will have repeat CMP a week after her Reclast infusion.  She has been advised to take calcium and vitamin D on a regular basis and is also to resistive exercises.  The plan of care was discussed as noted above.  Pollyann Savoy, MD  Note - This record has been created using Animal nutritionist.  Chart creation errors have been sought, but may not always  have been located. Such creation errors do not reflect on  the standard of medical care.

## 2017-07-20 ENCOUNTER — Telehealth (HOSPITAL_COMMUNITY): Payer: Self-pay

## 2017-07-20 NOTE — Telephone Encounter (Signed)
Called patient to see if she was interested in attending orientation on an earlier date. Patient said that was fine. Rescheduled orientation on 07/26/2017 at 1:30pm.

## 2017-07-22 ENCOUNTER — Encounter (INDEPENDENT_AMBULATORY_CARE_PROVIDER_SITE_OTHER): Payer: Self-pay

## 2017-07-22 ENCOUNTER — Ambulatory Visit (INDEPENDENT_AMBULATORY_CARE_PROVIDER_SITE_OTHER): Payer: Medicare Other | Admitting: Rheumatology

## 2017-07-22 ENCOUNTER — Encounter: Payer: Self-pay | Admitting: Physician Assistant

## 2017-07-22 VITALS — BP 110/68 | HR 62 | Resp 13 | Ht 65.5 in | Wt 164.0 lb

## 2017-07-22 DIAGNOSIS — Z79899 Other long term (current) drug therapy: Secondary | ICD-10-CM | POA: Diagnosis not present

## 2017-07-22 DIAGNOSIS — M1711 Unilateral primary osteoarthritis, right knee: Secondary | ICD-10-CM

## 2017-07-22 DIAGNOSIS — M19041 Primary osteoarthritis, right hand: Secondary | ICD-10-CM | POA: Diagnosis not present

## 2017-07-22 DIAGNOSIS — M810 Age-related osteoporosis without current pathological fracture: Secondary | ICD-10-CM

## 2017-07-22 DIAGNOSIS — M5136 Other intervertebral disc degeneration, lumbar region: Secondary | ICD-10-CM | POA: Diagnosis not present

## 2017-07-22 DIAGNOSIS — M19042 Primary osteoarthritis, left hand: Secondary | ICD-10-CM

## 2017-07-22 NOTE — Patient Instructions (Signed)

## 2017-07-23 LAB — COMPLETE METABOLIC PANEL WITH GFR
AG Ratio: 1.4 (calc) (ref 1.0–2.5)
ALT: 14 U/L (ref 6–29)
AST: 18 U/L (ref 10–35)
Albumin: 3.9 g/dL (ref 3.6–5.1)
Alkaline phosphatase (APISO): 67 U/L (ref 33–130)
BUN: 16 mg/dL (ref 7–25)
CO2: 33 mmol/L — ABNORMAL HIGH (ref 20–32)
Calcium: 9.2 mg/dL (ref 8.6–10.4)
Chloride: 98 mmol/L (ref 98–110)
Creat: 0.71 mg/dL (ref 0.60–0.93)
GFR, Est African American: 95 mL/min/{1.73_m2} (ref >=60)
GFR, Est Non African American: 82 mL/min/{1.73_m2} (ref >=60)
Globulin: 2.8 g/dL (calc) (ref 1.9–3.7)
Glucose, Bld: 114 mg/dL — ABNORMAL HIGH (ref 65–99)
Potassium: 4.3 mmol/L (ref 3.5–5.3)
Sodium: 135 mmol/L (ref 135–146)
Total Bilirubin: 0.6 mg/dL (ref 0.2–1.2)
Total Protein: 6.7 g/dL (ref 6.1–8.1)

## 2017-07-23 LAB — VITAMIN D 25 HYDROXY (VIT D DEFICIENCY, FRACTURES): Vit D, 25-Hydroxy: 40 ng/mL (ref 30–100)

## 2017-07-26 ENCOUNTER — Ambulatory Visit (HOSPITAL_COMMUNITY): Payer: Medicare Other

## 2017-07-26 ENCOUNTER — Telehealth (HOSPITAL_COMMUNITY): Payer: Self-pay

## 2017-07-26 NOTE — Telephone Encounter (Signed)
Called patient to confirm Pulmonary Rehab orientation appointment 07/30/17 at 9:30a. Patient stated she was aware.

## 2017-07-26 NOTE — Progress Notes (Signed)
Labs are normal.  Okay to schedule Reclast fusion.

## 2017-07-27 ENCOUNTER — Other Ambulatory Visit: Payer: Self-pay | Admitting: *Deleted

## 2017-07-28 ENCOUNTER — Telehealth: Payer: Self-pay | Admitting: Rheumatology

## 2017-07-28 ENCOUNTER — Telehealth: Payer: Self-pay

## 2017-07-28 NOTE — Telephone Encounter (Signed)
Patient called stating that she was returning your call. °

## 2017-07-28 NOTE — Telephone Encounter (Signed)
Pt returned call. She would like to proceed with Reclast infusion. Sue LushAndrea, LPN will put the order in the system and then contact the pt with information on when to call and schedule appointment. Patient voices understanding and denies any questions at this time.   Please place order and contact pt with information. Thanks!  Dejon Lukas, [place], CPhT 11:12 AM

## 2017-07-28 NOTE — Telephone Encounter (Signed)
-----   Message from Henriette CombsAndrea L Hatton, LPN sent at 3/08/65784/23/2019  3:41 PM EDT ----- Regarding: Please do benefits investigation Please do benefits investigation on Reclast. Thanks!

## 2017-07-28 NOTE — Telephone Encounter (Signed)
[facility] Medicare and [facility].   - Medicare will cover 80% pt is responsible for 20% and $185 deductible  [facility]. Spoke with Erskine SquibbJane B who states that the supplemental plans covers the 20%. Patient could pay up to $275 along with $185 deductible. A pre-cert is not required, however, they may request for medical records after the services are completed. Information will be faxed to the clinic if so.   Phone: [phone] Reference number: [id number]  Called pt to update. Left message.   Chrystian Cupples, [place], CPhT 10:56 AM

## 2017-07-29 ENCOUNTER — Other Ambulatory Visit: Payer: Self-pay | Admitting: *Deleted

## 2017-07-29 DIAGNOSIS — M810 Age-related osteoporosis without current pathological fracture: Secondary | ICD-10-CM

## 2017-07-29 NOTE — Telephone Encounter (Signed)
Infusion orders placed and left message to advise patient. Provided patient with contact information to schedule infusion.

## 2017-07-30 ENCOUNTER — Encounter (HOSPITAL_COMMUNITY)
Admission: RE | Admit: 2017-07-30 | Discharge: 2017-07-30 | Disposition: A | Discharge status: Home / Self Care | Payer: Medicare Other | Source: Ambulatory Visit | Attending: Internal Medicine | Admitting: Internal Medicine

## 2017-07-30 ENCOUNTER — Other Ambulatory Visit: Payer: Self-pay | Admitting: Cardiology

## 2017-07-30 ENCOUNTER — Encounter (HOSPITAL_COMMUNITY): Payer: Self-pay

## 2017-07-30 ENCOUNTER — Encounter (INDEPENDENT_AMBULATORY_CARE_PROVIDER_SITE_OTHER): Payer: Self-pay

## 2017-07-30 VITALS — BP 127/69 | HR 63 | Temp 97.9°F | Resp 16 | Ht 65.5 in | Wt 164.7 lb

## 2017-07-30 DIAGNOSIS — J449 Chronic obstructive pulmonary disease, unspecified: Secondary | ICD-10-CM

## 2017-07-30 NOTE — Progress Notes (Signed)
[name] 77 y.o. female Pulmonary Rehab Orientation Note Patient arrived today in Cardiac and Pulmonary Rehab for orientation to Pulmonary Rehab. [facility] [employer] [facility] wheel chair. She  does not carry portable oxygen. Per pt, she  Does not uses oxygen.  Color good, skin warm and dry. Patient is oriented to time and place. Patient's medical history, psychosocial health, and medications reviewed. Psychosocial assessment reveals pt lives with her spouse who has orthopedic challenges and use a walker. Pt is currently retired from . Pt hobbies include spending time with her friends.Pt reports her stress level is low.  Pt complains of every day stress but feels she manages this well.  Areas of stress  include Health related due to the chronic and progressive nature of COPD.  Pt is hopeful that participating in cardiac rehab will give her the energy, stamina and strength to complete ADL task with ease.  Pt is fearful that she may end up on oxygen. Pt asked questions about the general make up of the class as far as diagnosis and overall condition.  Pt does not signs of depression. Although pt exhibits some signs of depression include hopelessness and difficulty maintaining sleep.These symptoms are associated with her COPD PHQ2/9 score 2/5. Pt shows good  coping skills with positive outlook . Pt offered emotional support and reassurance and felt at ease with rehab staff.  Pt participated in Cardiac rehab in 2011 and is familiar with some of the staff. Will continue to monitor and evaluate progress toward psychosocial goal(s) of  Decrease in shortness of breath when going to the grocery store and needing to pick up heavier items. Pt desires to feel better both physically which will support a positive and healthy outlook on life. Physical assessment reveals heart rate is normal, breath sounds :"clear to auscultation, no wheezes, rales, or rhonchi.  Grip strength equal, strong. Distal pulses palpable.  Patient reports she does take medications as prescribed. Patient states she follows a Regular diet. The patient reports no specific efforts to gain or lose weight. Pt is comfortable with her weight but if she would lose some weight because of the increase in physical activity she is ok with that.  Patient's weight will be monitored closely. Demonstration and practice of PLB using pulse oximeter. Patient able to return demonstration satisfactorily. Safety and hand hygiene in the exercise area reviewed with patient. Patient voices understanding of the information reviewed. Department expectations discussed with patient and achievable goals were set. The patient shows enthusiasm about attending the program and we look forward to working with this nice patient. The patient is scheduled for a 6 min walk test on May 2 at 3:45 and to begin exercise  Tentatively on 5/9 after completing required face to face evaluation with Dr. Molli KnockYacoub. 45 minutes was spent on a variety of activities such as assessment of the patient, obtaining baseline data including height, weight, BMI, and grip strength, verifying medical history, allergies, and current medications, and teaching patient strategies for performing tasks with less respiratory effort with emphasis on pursed lip breathing. [id number] Karlene Linemanarlette Carlton RN, BSN Cardiac and Pulmonary Rehab Nurse Navigator

## 2017-08-04 ENCOUNTER — Ambulatory Visit (HOSPITAL_COMMUNITY)
Admission: RE | Admit: 2017-08-04 | Discharge: 2017-08-04 | Disposition: A | Discharge status: Home / Self Care | Payer: Medicare Other | Source: Ambulatory Visit | Attending: Rheumatology | Admitting: Rheumatology

## 2017-08-04 DIAGNOSIS — M810 Age-related osteoporosis without current pathological fracture: Secondary | ICD-10-CM | POA: Diagnosis not present

## 2017-08-04 MED ORDER — ZOLEDRONIC ACID 5 MG/100ML IV SOLN
5.0000 mg | Freq: Once | INTRAVENOUS | Status: AC
  Administered 2017-08-04: 5 mg via INTRAVENOUS

## 2017-08-04 MED ORDER — ZOLEDRONIC ACID 5 MG/100ML IV SOLN
INTRAVENOUS | Status: AC
  Administered 2017-08-04: 5 mg via INTRAVENOUS
  Filled 2017-08-04: qty 100

## 2017-08-04 MED ORDER — ACETAMINOPHEN 325 MG PO TABS: 650.0000 mg | ORAL_TABLET | Freq: Once | ORAL | Status: DC

## 2017-08-04 MED ORDER — DIPHENHYDRAMINE HCL 25 MG PO CAPS: 25.0000 mg | ORAL_CAPSULE | Freq: Once | ORAL | Status: DC

## 2017-08-05 ENCOUNTER — Ambulatory Visit (HOSPITAL_COMMUNITY): Payer: Medicare Other

## 2017-08-08 NOTE — Progress Notes (Signed)
Office Visit    Patient Name: [name] Date of Encounter: 08/11/2017  Primary Care Provider:  Burton Apley, MD Primary Cardiologist:  P. [place], MD   Chief Complaint    77 year old female with a prior history of coronary artery disease status post anterior MI and LAD bare-metal stenting complicated by VF arrest in 2011, hypertension, hyperlipidemia, GERD, TIA,who presents for follow-up.  Past Medical History    Past Medical History:  Diagnosis Date  . Compression fracture    T9  . COPD (chronic obstructive pulmonary disease) (HCC)   . Coronary artery disease 08/14/2009   a. 08/2009: Ant MI c/b v-fib arrest, s/p PTCA/BMS to LAD. (STENT Placement); b. 2013 Myoview: no ischemia; c. 11/2012 Cath: patent mLAD stent, RI 70 (stable), otw nonobs dzs, EF 65%;  d. 07/2016 Lexiscan MV: EF 56%, no ischemia/infarct.  Marland Kitchen GERD (gastroesophageal reflux disease)   . HTN (hypertension)   . Hyperlipidemia   . Ischemic cardiomyopathy    a. EF 40-45% in 08/2009 at time of MI. b. Improved to 55-60% by June 2011; c. 03/2016 Echo: EF 55-60%, no rwma, Gr1 DD, mild LVH.  . Osteoarthritis   . Osteopenia   . PONV (postoperative nausea and vomiting)   . Postoperative groin pseudoaneurysm (HCC)    a. 04/2010 following diagnostic cardiac cath, s/p compression.  . Rib fractures    left sided second and sixth rib   . Shingles   . TIA (transient ischemic attack)    a. 03/2016 MRI/MRA negative/unremarkable.  . Ventricular fibrillation (HCC)    a. 08/2009: due to anterior MI.   Past Surgical History:  Procedure Laterality Date  . APPENDECTOMY    . BACK SURGERY    . LEFT HEART CATHETERIZATION WITH CORONARY ANGIOGRAM N/A 11/16/2012   Procedure: LEFT HEART CATHETERIZATION WITH CORONARY ANGIOGRAM;  Surgeon: Kathleene Hazel, MD;  Location: [facility] CATH LAB;  Service: Cardiovascular;  Laterality: N/A;  . LEG SURGERY     R low  . ORIF HIP FRACTURE    . TONSILLECTOMY      Allergies  Allergies    Allergen Reactions  . Codeine Nausea And Vomiting  . Imdur [Isosorbide Dinitrate] Nausea Only    Headache Headache  . Other Nausea And Vomiting  . Sulfonamide Derivatives Nausea And Vomiting    History of Present Illness    77 year old female with the above complex past medical history. She is status post anterior MI and VF arrest in May 2011 with bare-metal stenting of the LAD. EF was 40-45% at that time but subsequently recovered.  Her last catheterization took place in August 2014 which revealed stable 70% stenosis within the ramus intermedius and a patent LAD stent. EF was 65% at that time. Other history includes hypertension, HL, ICM with subsequent Normalization of LV function, TIA in December 2017.  In April 2018 she had a  [employer] which showed no evidence of ischemia or infarct with normal LV function.   On follow up today she is doing well from a cardiac standpoint. No chest pain. She does have dyspnea and has been seen by Dr. Sherene Sires. She has GOLD stage 2 COPD. On inhaler therapy now. Planning to start cardiac Rehab. Lipids followed by Dr. Su Hilt.   Home Medications    Allergies as of 08/11/2017      Reactions   Codeine Nausea And Vomiting   Imdur [isosorbide Dinitrate] Nausea Only   Headache Headache   Other Nausea And Vomiting   Sulfonamide Derivatives  Nausea And Vomiting      Medication List        Accurate as of 08/11/17  3:48 PM. Always use your most recent med list.          aspirin EC 81 MG tablet Take 1 tablet (81 mg total) daily by mouth.   calcium carbonate 600 MG Tabs tablet Commonly known as:  OS-CAL Take 600 mg by mouth 2 (two) times daily with a meal.   Glycopyrrolate-Formoterol 9-4.8 MCG/ACT Aero Commonly known as:  BEVESPI AEROSPHERE Inhale 2 puffs into the lungs 2 (two) times daily.   loratadine 10 MG tablet Commonly known as:  CLARITIN Take 10 mg by mouth daily as needed.   losartan 50 MG tablet Commonly known as:  COZAAR TAKE 1  TABLET (50 MG TOTAL) DAILY BY MOUTH.   metoprolol succinate 25 MG 24 hr tablet Commonly known as:  TOPROL-XL Take 1 tablet by mouth daily.   nitroGLYCERIN 0.4 MG SL tablet Commonly known as:  NITROSTAT Place 1 tablet (0.4 mg total) under the tongue every 5 (five) minutes as needed. For chest pain   pantoprazole 40 MG tablet Commonly known as:  PROTONIX Take 1 tablet by mouth daily.   RECLAST IV Inject into the vein. Once Yearly   rosuvastatin 20 MG tablet Commonly known as:  CRESTOR Take 1 tablet (20 mg total) by mouth at bedtime.   traMADol 50 MG tablet Commonly known as:  ULTRAM Take 50 mg by mouth every 6 (six) hours as needed.   Vitamin D3 1000 units Caps Take 1,000 Units by mouth daily.        Review of Systems    As above, improved dyspnea and exercise tolerance. She denies chest pain, palpitations, PND, orthopnea, dizziness, syncope, edema, or early satiety. All other systems reviewed and are otherwise negative except as noted above.  Physical Exam    VS:  BP 140/76 (BP Location: Left Arm, Patient Position: Sitting, Cuff Size: Normal)   Pulse (!) 55   Ht 5' 5.5" (1.664 m)   Wt 166 lb (75.3 kg)   BMI 27.20 kg/m  , BMI Body mass index is 27.2 kg/m. GENERAL:  Well appearing, obese WF in NAD HEENT:  PERRL, EOMI, sclera are clear. Oropharynx is clear. NECK:  No jugular venous distention, carotid upstroke brisk and symmetric, no bruits, no thyromegaly or adenopathy LUNGS:  Clear to auscultation bilaterally, decreased BS.  CHEST:  Unremarkable HEART:  RRR,  PMI not displaced or sustained,S1 and S2 within normal limits, no S3, no S4: no clicks, no rubs, no murmurs ABD:  Soft, nontender. BS +, no masses or bruits. No hepatomegaly, no splenomegaly EXT:  2 + pulses throughout, no edema, no cyanosis no clubbing SKIN:  Warm and dry.  No rashes NEURO:  Alert and oriented x 3. Cranial nerves II through XII intact. PSYCH:  Cognitively intact     Accessory Clinical  Findings     Laboratory data:  Lab Results  Component Value Date   WBC 6.2 05/14/2017   HGB 13.7 05/14/2017   HCT 39.8 05/14/2017   PLT 315 05/14/2017   GLUCOSE 114 (H) 07/22/2017   CHOL 158 03/17/2016   TRIG 72 03/17/2016   HDL 82 03/17/2016   LDLDIRECT 134.9 11/21/2007   LDLCALC 62 03/17/2016   ALT 14 07/22/2017   AST 18 07/22/2017   NA 135 07/22/2017   K 4.3 07/22/2017   CL 98 07/22/2017   CREATININE 0.71 07/22/2017   BUN 16  07/22/2017   CO2 33 (H) 07/22/2017   TSH 1.72 05/14/2017   INR 0.98 03/16/2016   HGBA1C 5.8 (H) 03/17/2016   Ecg today shows NSR with LAD, old anterolateral infarct. No acute change. I have personally reviewed and interpreted this study.   Lexiscan Myoview-07/10/2016  The left ventricular ejection fraction is normal (55-65%). Nuclear stress EF: 56%. There was no ST segment deviation noted during stress. The study is normal. This is a low risk study.   Assessment & Plan    1.  Coronary artery disease: Normal Myoview study in April 2018. Last Echo in 2017 showed normal LV function. Now asymptomatic except for dyspnea which appears to be more related to COPD.  Continue medical therapy. Encourage increase in aerobic activity.  2. Essential hypertension: Blood pressure is well controlled now.   3. Hyperlipidemia: She has resumed Crestor. LDL was 62 in December 2017. Labs followed by Dr. Su Hilt.   4. COPD. Followed by Dr. Sherene Sires. Plan pulmonary Rehab.   Follow up in 6 months.  Diasha Castleman [place], MD,FACC 08/11/2017, 3:48 PM

## 2017-08-09 ENCOUNTER — Ambulatory Visit (HOSPITAL_COMMUNITY): Payer: Medicare Other

## 2017-08-10 ENCOUNTER — Encounter (HOSPITAL_COMMUNITY)
Admission: RE | Admit: 2017-08-10 | Discharge: 2017-08-10 | Disposition: A | Discharge status: Home / Self Care | Payer: Medicare Other | Source: Ambulatory Visit | Attending: Internal Medicine | Admitting: Internal Medicine

## 2017-08-10 ENCOUNTER — Encounter (HOSPITAL_COMMUNITY): Payer: Self-pay | Admitting: *Deleted

## 2017-08-10 DIAGNOSIS — J449 Chronic obstructive pulmonary disease, unspecified: Secondary | ICD-10-CM | POA: Diagnosis present

## 2017-08-10 NOTE — Progress Notes (Signed)
S: No events, no new complaints Can walk 1 block before SOB and 1 flight of stairs before having to stop.  No CP reported  O: Well appearing, NAD Heart: RRR, Nl S1/S2 and -M/R/G. Lung: Decreased BS diffusely Abdomen: Soft, NT, ND and+BS Extremities: -edema and -tenderness Neuro: alert and interactive, moving all ext to command Skin: Intact  A/P: 77 year old with COPD who presents pulmonary rehab as initial visit.    Ok to proceed with exercise program, all questions answered, 6 MWT today.  Alyson Reedy, M.D. [facility] Pulmonary/Critical Care Medicine. Pager: [phone]. After hours pager: [phone].

## 2017-08-11 ENCOUNTER — Encounter: Payer: Self-pay | Admitting: Cardiology

## 2017-08-11 ENCOUNTER — Ambulatory Visit (INDEPENDENT_AMBULATORY_CARE_PROVIDER_SITE_OTHER): Payer: Medicare Other | Admitting: Cardiology

## 2017-08-11 VITALS — BP 140/76 | HR 55 | Ht 65.5 in | Wt 166.0 lb

## 2017-08-11 DIAGNOSIS — I10 Essential (primary) hypertension: Secondary | ICD-10-CM

## 2017-08-11 DIAGNOSIS — E785 Hyperlipidemia, unspecified: Secondary | ICD-10-CM | POA: Diagnosis not present

## 2017-08-11 DIAGNOSIS — I2510 Atherosclerotic heart disease of native coronary artery without angina pectoris: Secondary | ICD-10-CM | POA: Diagnosis not present

## 2017-08-11 DIAGNOSIS — E7800 Pure hypercholesterolemia, unspecified: Secondary | ICD-10-CM | POA: Diagnosis not present

## 2017-08-11 NOTE — Patient Instructions (Signed)
Your physician wants you to follow-up in:  6 months. You will receive a reminder letter in the mail two months in advance. If you don't receive a letter, please call our office to schedule the follow-up appointment.   

## 2017-08-12 NOTE — Progress Notes (Signed)
Pulmonary Individual Treatment Plan  Patient Details  Name: [name] MRN: [id number] Date of Birth: 27-Mar-1941 Referring Provider:     Pulmonary Rehab Walk Test from 08/10/2017 in [place]  Referring Provider  Dr. Melvyn Novas      Initial Encounter Date:    Pulmonary Rehab Walk Test from 08/10/2017 in [facility]  Date  08/12/17  Referring Provider  Dr. Melvyn Novas      Visit Diagnosis: COPD mixed type [facility])  Patient's Home Medications on Admission:   Current Outpatient Medications:  .  aspirin EC 81 MG tablet, Take 1 tablet (81 mg total) daily by mouth., Disp: 90 tablet, Rfl: 3 .  calcium carbonate (OS-CAL) 600 MG TABS tablet, Take 600 mg by mouth 2 (two) times daily with a meal. , Disp: , Rfl:  .  Cholecalciferol (VITAMIN D3) 1000 units CAPS, Take 1,000 Units by mouth daily., Disp: , Rfl:  .  Glycopyrrolate-Formoterol (BEVESPI AEROSPHERE) 9-4.8 MCG/ACT AERO, Inhale 2 puffs into the lungs 2 (two) times daily., Disp: 1 Inhaler, Rfl: 11 .  loratadine (CLARITIN) 10 MG tablet, Take 10 mg by mouth daily as needed. , Disp: , Rfl:  .  losartan (COZAAR) 50 MG tablet, TAKE 1 TABLET (50 MG TOTAL) DAILY BY MOUTH., Disp: 90 tablet, Rfl: 1 .  metoprolol succinate (TOPROL-XL) 25 MG 24 hr tablet, Take 1 tablet by mouth daily., Disp: , Rfl:  .  nitroGLYCERIN (NITROSTAT) 0.4 MG SL tablet, Place 1 tablet (0.4 mg total) under the tongue every 5 (five) minutes as needed. For chest pain, Disp: 25 tablet, Rfl: 1 .  pantoprazole (PROTONIX) 40 MG tablet, Take 1 tablet by mouth daily., Disp: , Rfl:  .  rosuvastatin (CRESTOR) 20 MG tablet, Take 1 tablet (20 mg total) by mouth at bedtime., Disp: 90 tablet, Rfl: 3 .  traMADol (ULTRAM) 50 MG tablet, Take 50 mg by mouth every 6 (six) hours as needed., Disp: , Rfl:  .  Zoledronic Acid (RECLAST IV), Inject into the vein. Once Yearly, Disp: , Rfl:   Past Medical History: Past Medical History:  Diagnosis Date   . Compression fracture    T9  . COPD (chronic obstructive pulmonary disease) ([place])   . Coronary artery disease 08/14/2009   a. 08/2009: Ant MI c/b v-fib arrest, s/p PTCA/BMS to LAD. (STENT Placement); b. 2013 Myoview: no ischemia; c. 11/2012 Cath: patent mLAD stent, RI 70 (stable), otw nonobs dzs, EF 65%;  d. 07/2016 Lexiscan MV: EF 56%, no ischemia/infarct.  [place] Kitchen GERD (gastroesophageal reflux disease)   . HTN (hypertension)   . Hyperlipidemia   . Ischemic cardiomyopathy    a. EF 40-45% in 08/2009 at time of MI. b. Improved to 55-60% by June 2011; c. 03/2016 Echo: EF 55-60%, no rwma, Gr1 DD, mild LVH.  . Osteoarthritis   . Osteopenia   . PONV (postoperative nausea and vomiting)   . Postoperative groin pseudoaneurysm ([place])    a. 04/2010 following diagnostic cardiac cath, s/p compression.  . Rib fractures    left sided second and sixth rib   . Shingles   . TIA (transient ischemic attack)    a. 03/2016 MRI/MRA negative/unremarkable.  . Ventricular fibrillation ([place])    a. 08/2009: due to anterior MI.    Tobacco Use: Social History   Tobacco Use  Smoking Status Former Smoker  . Packs/day: 2.00  . Years: 20.00  . Pack years: 40.00  . Types: Cigarettes  . Last attempt to  quit: 04/06/1984  . Years since quitting: 33.3  Smokeless Tobacco Never Used    Labs: Recent Hydrographic surveyor    Labs for ITP Cardiac and Pulmonary Rehab Latest Ref Rng & Units 05/21/2010 11/20/2010 11/15/2012 03/16/2016 03/17/2016   Cholestrol 0 - 200 mg/dL 161 096 - - 045   LDLCALC 0 - 99 mg/dL 58 54 - - 62   LDLDIRECT mg/dL - - - - -   HDL >40 mg/dL 98.11 914.78 - - 82   Trlycerides <150 mg/dL 29.5 62.1 - - 72   Hemoglobin A1c 4.8 - 5.6 % - - - - 5.8(H)   TCO2 0 - 100 mmol/L - - 28 30 -      Capillary Blood Glucose: Lab Results  Component Value Date   GLUCAP 99 03/16/2016     Pulmonary Assessment Scores: Pulmonary Assessment Scores    Row Name 08/10/17 1825 08/12/17 0758       ADL UCSD   ADL  Phase  Entry  Entry    SOB Score total  65  -      CAT Score   CAT Score  20 Entry  -      mMRC Score   mMRC Score  -  2       Pulmonary Function Assessment: Pulmonary Function Assessment - 07/30/17 1147      Breath   Bilateral Breath Sounds  Clear    Shortness of Breath  Yes;Limiting activity       Exercise Target Goals: Date: 08/12/17  Exercise Program Goal: Individual exercise prescription set using results from initial 6 min walk test and THRR while considering  patient's activity barriers and safety.    Exercise Prescription Goal: Initial exercise prescription builds to 30-45 minutes a day of aerobic activity, 2-3 days per week.  Home exercise guidelines will be given to patient during program as part of exercise prescription that the participant will acknowledge.  Activity Barriers & Risk Stratification: Activity Barriers & Cardiac Risk Stratification - 07/30/17 1028      Activity Barriers & Cardiac Risk Stratification   Activity Barriers  Shortness of Breath;Balance Concerns    Cardiac Risk Stratification  Moderate       6 Minute Walk: 6 Minute Walk    Row Name 08/12/17 0737         6 Minute Walk   Phase  Initial     Distance  1000 feet     Walk Time  6 minutes     # of Rest Breaks  0     MPH  1.89     METS  2.45     RPE  13     Perceived Dyspnea   2     Symptoms  No     Resting HR  59 bpm     Resting BP  120/80     Resting Oxygen Saturation   95 %     Exercise Oxygen Saturation  during 6 min walk  90 %     Max Ex. HR  90 bpm     Max Ex. BP  144/80     2 Minute Post BP  128/80       Interval HR   1 Minute HR  68     2 Minute HR  83     3 Minute HR  87     4 Minute HR  90     5 Minute HR  90  6 Minute HR  70     2 Minute Post HR  63     Interval Heart Rate?  Yes       Interval Oxygen   Interval Oxygen?  Yes     Baseline Oxygen Saturation %  95 %     1 Minute Oxygen Saturation %  94 %     1 Minute Liters of Oxygen  0 L     2 Minute  Oxygen Saturation %  95 %     2 Minute Liters of Oxygen  0 L     3 Minute Oxygen Saturation %  90 %     3 Minute Liters of Oxygen  0 L     4 Minute Oxygen Saturation %  90 %     4 Minute Liters of Oxygen  0 L     5 Minute Oxygen Saturation %  90 %     5 Minute Liters of Oxygen  0 L     6 Minute Oxygen Saturation %  92 %     6 Minute Liters of Oxygen  0 L     2 Minute Post Oxygen Saturation %  95 %     2 Minute Post Liters of Oxygen  0 L        Oxygen Initial Assessment: Oxygen Initial Assessment - 08/12/17 0737      Initial 6 min Walk   Oxygen Used  None      Program Oxygen Prescription   Program Oxygen Prescription  None       Oxygen Re-Evaluation:   Oxygen Discharge (Final Oxygen Re-Evaluation):   Initial Exercise Prescription: Initial Exercise Prescription - 08/12/17 0700      Date of Initial Exercise RX and Referring Provider   Date  08/12/17    Referring Provider  Dr. Sherene Sires      Recumbant Bike   Level  1    Watts  10    Minutes  17      NuStep   Level  2    SPM  80    Minutes  17    METs  1.5      Track   Laps  5    Minutes  17      Prescription Details   Frequency (times per week)  2    Duration  Progress to 45 minutes of aerobic exercise without signs/symptoms of physical distress      Intensity   THRR 40-80% of Max Heartrate  57-114    Ratings of Perceived Exertion  11-13    Perceived Dyspnea  0-4      Progression   Progression  Continue progressive overload as per policy without signs/symptoms or physical distress.      Resistance Training   Training Prescription  Yes    Weight  orange bands    Reps  10-15       Perform Capillary Blood Glucose checks as needed.  Exercise Prescription Changes:   Exercise Comments:   Exercise Goals and Review: Exercise Goals    Row Name 07/30/17 1034 07/30/17 1133           Exercise Goals   Increase Physical Activity  Yes  -      Intervention  Provide advice, education, support and  counseling about physical activity/exercise needs.;Develop an individualized exercise prescription for aerobic and resistive training based on initial evaluation findings, risk stratification, comorbidities and participant's personal goals.  -  Expected Outcomes  Short Term: Attend rehab on a regular basis to increase amount of physical activity.;Long Term: Add in home exercise to make exercise part of routine and to increase amount of physical activity.;Long Term: Exercising regularly at least 3-5 days a week.  Short Term: Attend rehab on a regular basis to increase amount of physical activity.;Long Term: Add in home exercise to make exercise part of routine and to increase amount of physical activity.;Long Term: Exercising regularly at least 3-5 days a week.      Increase Strength and Stamina  Yes  -      Intervention  Provide advice, education, support and counseling about physical activity/exercise needs.;Develop an individualized exercise prescription for aerobic and resistive training based on initial evaluation findings, risk stratification, comorbidities and participant's personal goals.  -      Expected Outcomes  Short Term: Increase workloads from initial exercise prescription for resistance, speed, and METs.;Short Term: Perform resistance training exercises routinely during rehab and add in resistance training at home;Long Term: Improve cardiorespiratory fitness, muscular endurance and strength as measured by increased METs and functional capacity ( )  -      Able to understand and use rate of perceived exertion (RPE) scale  Yes  -      Intervention  Provide education and explanation on how to use RPE scale  -      Expected Outcomes  Short Term: Able to use RPE daily in rehab to express subjective intensity level;Long Term:  Able to use RPE to guide intensity level when exercising independently  -      Able to understand and use Dyspnea scale  Yes  -      Intervention  Provide education  and explanation on how to use Dyspnea scale  -      Expected Outcomes  Short Term: Able to use Dyspnea scale daily in rehab to express subjective sense of shortness of breath during exertion;Long Term: Able to use Dyspnea scale to guide intensity level when exercising independently  -      Knowledge and understanding of Target Heart Rate Range (THRR)  Yes  -      Intervention  Provide education and explanation of THRR including how the numbers were predicted and where they are located for reference  -      Expected Outcomes  Short Term: Able to state/look up THRR;Long Term: Able to use THRR to govern intensity when exercising independently;Short Term: Able to use daily as guideline for intensity in rehab  -      Understanding of Exercise Prescription  Yes  -      Intervention  Provide education, explanation, and written materials on patient's individual exercise prescription  -      Expected Outcomes  Short Term: Able to explain program exercise prescription;Long Term: Able to explain home exercise prescription to exercise independently  -         Exercise Goals Re-Evaluation :   Discharge Exercise Prescription (Final Exercise Prescription Changes):   Nutrition:  Target Goals: Understanding of nutrition guidelines, daily intake of sodium 1500mg , cholesterol 200mg , calories 30% from fat and 7% or less from saturated fats, daily to have 5 or more servings of fruits and vegetables.  Biometrics:    Nutrition Therapy Plan and Nutrition Goals:   Nutrition Assessments: Nutrition Assessments - 08/11/17 1514      Rate Your Plate Scores   Pre Score  55       Nutrition Goals Re-Evaluation:  Nutrition Goals Discharge (Final Nutrition Goals Re-Evaluation):   Psychosocial: Target Goals: Acknowledge presence or absence of significant depression and/or stress, maximize coping skills, provide positive support system. Participant is able to verbalize types and ability to use techniques and  skills needed for reducing stress and depression.  Initial Review & Psychosocial Screening: Initial Psych Review & Screening - 07/30/17 1154      Family Dynamics   Comments  Pt has help from spouse but he has limited mobility and uses a walker.  Pt children live [place] of state.  Pt freinds are also elderly.  Pt feesl she manages the best you can.  (Pended)        Quality of Life Scores:  Scores of 19 and below usually indicate a poorer quality of life in these areas.  A difference of  2-3 points is a clinically meaningful difference.  A difference of 2-3 points in the total score of the Quality of Life Index has been associated with significant improvement in overall quality of life, self-image, physical symptoms, and general health in studies assessing change in quality of life.   PHQ-9: Recent Review Flowsheet Data    Depression screen [facility] 2/9 07/30/2017   Decreased Interest 1   Down, Depressed, Hopeless 1   PHQ - 2 Score 2   Altered sleeping 1   Tired, decreased energy 1   Change in appetite 0   Feeling bad or failure about yourself  1   Trouble concentrating 0   Moving slowly or fidgety/restless 0   Suicidal thoughts 0   PHQ-9 Score 5   Difficult doing work/chores Not difficult at all     Interpretation of Total Score  Total Score Depression Severity:  1-4 = Minimal depression, 5-9 = Mild depression, 10-14 = Moderate depression, 15-19 = Moderately severe depression, 20-27 = Severe depression   Psychosocial Evaluation and Intervention: Psychosocial Evaluation - 07/30/17 1134      Psychosocial Evaluation & Interventions   Comments  Pt reports every day stress but does not feel this is more than she can manage.    Expected Outcomes  positive and healthy coping skills and learned techniques for stress managment       Psychosocial Re-Evaluation:   Psychosocial Discharge (Final Psychosocial Re-Evaluation):   Education: Education Goals: Education classes will be provided  on a weekly basis, covering required topics. Participant will state understanding/return demonstration of topics presented.  Learning Barriers/Preferences: Learning Barriers/Preferences - 07/30/17 1026      Learning Barriers/Preferences   Learning Barriers  Sight;Hearing wears glasses  wears hearing aids    Learning Preferences  Written Material;Pictoral;Group Instruction       Education Topics: Risk Factor Reduction:  -Group instruction that is supported by a PowerPoint presentation. Instructor discusses the definition of a risk factor, different risk factors for pulmonary disease, and how the heart and lungs work together.     Nutrition for Pulmonary Patient:  -Group instruction provided by PowerPoint slides, verbal discussion, and written materials to support subject matter. The instructor gives an explanation and review of healthy diet recommendations, which includes a discussion on weight management, recommendations for fruit and vegetable consumption, as well as protein, fluid, caffeine, fiber, sodium, sugar, and alcohol. Tips for eating when patients are short of breath are discussed.   Pursed Lip Breathing:  -Group instruction that is supported by demonstration and informational handouts. Instructor discusses the benefits of pursed lip and diaphragmatic breathing and detailed demonstration on how to preform both.  Oxygen Safety:  -Group instruction provided by PowerPoint, verbal discussion, and written material to support subject matter. There is an overview of "What is Oxygen" and "Why do we need it".  Instructor also reviews how to create a safe environment for oxygen use, the importance of using oxygen as prescribed, and the risks of noncompliance. There is a brief discussion on traveling with oxygen and resources the patient may utilize.   Oxygen Equipment:  -Group instruction provided by [facility] Staff utilizing handouts, written materials, and equipment  demonstrations.   Signs and Symptoms:  -Group instruction provided by written material and verbal discussion to support subject matter. Warning signs and symptoms of infection, stroke, and heart attack are reviewed and when to call the physician/911 reinforced. Tips for preventing the spread of infection discussed.   Advanced Directives:  -Group instruction provided by verbal instruction and written material to support subject matter. Instructor reviews Advanced Directive laws and proper instruction for filling out document.   Pulmonary Video:  -Group video education that reviews the importance of medication and oxygen compliance, exercise, good nutrition, pulmonary hygiene, and pursed lip and diaphragmatic breathing for the pulmonary patient.   Exercise for the Pulmonary Patient:  -Group instruction that is supported by a PowerPoint presentation. Instructor discusses benefits of exercise, core components of exercise, frequency, duration, and intensity of an exercise routine, importance of utilizing pulse oximetry during exercise, safety while exercising, and options of places to exercise outside of rehab.     Pulmonary Medications:  -Verbally interactive group education provided by instructor with focus on inhaled medications and proper administration.   Anatomy and Physiology of the Respiratory System and Intimacy:  -Group instruction provided by PowerPoint, verbal discussion, and written material to support subject matter. Instructor reviews respiratory cycle and anatomical components of the respiratory system and their functions. Instructor also reviews differences in obstructive and restrictive respiratory diseases with examples of each. Intimacy, Sex, and Sexuality differences are reviewed with a discussion on how relationships can change when diagnosed with pulmonary disease. Common sexual concerns are reviewed.   MD DAY -A group question and answer session with a medical doctor  that allows participants to ask questions that relate to their pulmonary disease state.   OTHER EDUCATION -Group or individual verbal, written, or video instructions that support the educational goals of the pulmonary rehab program.   Holiday Eating Survival Tips:  -Group instruction provided by PowerPoint slides, verbal discussion, and written materials to support subject matter. The instructor gives patients tips, tricks, and techniques to help them not only survive but enjoy the holidays despite the onslaught of food that accompanies the holidays.   Knowledge Questionnaire Score: Knowledge Questionnaire Score - 08/10/17 1824      Knowledge Questionnaire Score   Pre Score  15/18       Core Components/Risk Factors/Patient Goals at Admission: Personal Goals and Risk Factors at Admission - 07/30/17 1019      Core Components/Risk Factors/Patient Goals on Admission    Weight Management  Weight Loss    Improve shortness of breath with ADL's  Yes    Intervention  Provide education, individualized exercise plan and daily activity instruction to help decrease symptoms of SOB with activities of daily living.    Expected Outcomes  Short Term: Improve cardiorespiratory fitness to achieve a reduction of symptoms when performing ADLs    Hypertension  Yes    Intervention  Provide education on lifestyle modifcations including regular physical activity/exercise, weight management, moderate sodium restriction and  increased consumption of fresh fruit, vegetables, and low fat dairy, alcohol moderation, and smoking cessation.;Monitor prescription use compliance.    Expected Outcomes  Short Term: Continued assessment and intervention until BP is < 140/27mm HG in hypertensive participants. < 130/45mm HG in hypertensive participants with diabetes, heart failure or chronic kidney disease.;Long Term: Maintenance of blood pressure at goal levels.    Lipids  Yes    Intervention  Provide education and support  for participant on nutrition & aerobic/resistive exercise along with prescribed medications to achieve LDL 70mg , HDL >40mg .    Expected Outcomes  Short Term: Participant states understanding of desired cholesterol values and is compliant with medications prescribed. Participant is following exercise prescription and nutrition guidelines.;Long Term: Cholesterol controlled with medications as prescribed, with individualized exercise RX and with personalized nutrition plan. Value goals: LDL < 70mg , HDL > 40 mg.    Stress  Yes    Intervention  Offer individual and/or small group education and counseling on adjustment to heart disease, stress management and health-related lifestyle change. Teach and support self-help strategies.;Refer participants experiencing significant psychosocial distress to appropriate mental health specialists for further evaluation and treatment. When possible, include family members and significant others in education/counseling sessions.    Expected Outcomes  Short Term: Participant demonstrates changes in health-related behavior, relaxation and other stress management skills, ability to obtain effective social support, and compliance with psychotropic medications if prescribed.;Long Term: Emotional wellbeing is indicated by absence of clinically significant psychosocial distress or social isolation.    Personal Goal Other  Yes    Personal Goal  Be able travel with short trips with ease and no shortness of breath    Expected Outcomes  Pt will be able to travel to [place] for her grandson high school graduation in [place].   Pt will be able to travel to [place] for vacation in June.       Core Components/Risk Factors/Patient Goals Review:    Core Components/Risk Factors/Patient Goals at Discharge (Final Review):    ITP Comments: ITP Comments    Row Name 07/30/17 1011           ITP Comments  Dr. Koren Bound, Medical Director          Comments:

## 2017-08-17 ENCOUNTER — Encounter (HOSPITAL_COMMUNITY)
Admission: RE | Admit: 2017-08-17 | Discharge: 2017-08-17 | Disposition: A | Discharge status: Home / Self Care | Payer: Medicare Other | Source: Ambulatory Visit | Attending: Internal Medicine | Admitting: Internal Medicine

## 2017-08-17 VITALS — Wt 165.3 lb

## 2017-08-17 DIAGNOSIS — J449 Chronic obstructive pulmonary disease, unspecified: Secondary | ICD-10-CM | POA: Diagnosis not present

## 2017-08-17 NOTE — Progress Notes (Signed)
Daily Session Note  Patient Details  Name: [name] MRN: [id number] Date of Birth: 1940/10/04 Referring Provider:     Pulmonary Rehab Walk Test from 08/10/2017 in [place]  Referring Provider  Dr. Melvyn Novas      Encounter Date: 08/17/2017  Check In: Session Check In - 08/17/17 1212      Check-In   Location  [facility]    Staff Present  Cloyde Reams Ericha Whittingham, MS, ACSM RCEP, Exercise Physiologist;Lisa Colletta [place], RN, [place] physician immediately available to respond to emergencies  [facility] immediately available    Physician(s)  Dr. Loleta Books    Medication changes reported      No    Fall or balance concerns reported     No    Tobacco Cessation  No Change    Warm-up and Cool-down  Performed as group-led instruction    Resistance Training Performed  Yes    VAD Patient?  No      Pain Assessment   Currently in Pain?  No/denies    Multiple Pain Sites  No       Capillary Blood Glucose: No results found for this or any previous visit (from the past 24 hour(s)).  Exercise Prescription Changes - 08/17/17 1200      Response to Exercise   Blood Pressure (Admit)  106/70    Blood Pressure (Exercise)  140/80    Blood Pressure (Exit)  110/60    Heart Rate (Admit)  65 bpm    Heart Rate (Exercise)  94 bpm    Heart Rate (Exit)  99 bpm    Oxygen Saturation (Admit)  95 %    Oxygen Saturation (Exercise)  91 %    Oxygen Saturation (Exit)  98 %    Rating of Perceived Exertion (Exercise)  14    Perceived Dyspnea (Exercise)  2    Duration  Progress to 45 minutes of aerobic exercise without signs/symptoms of physical distress    Intensity  THRR unchanged      Progression   Progression  Continue to progress workloads to maintain intensity without signs/symptoms of physical distress.      Resistance Training   Training Prescription  Yes    Weight  orange bands    Reps  10-15      Recumbant Bike   Level   2    Watts  10    Minutes  17      NuStep   Level  2    SPM  80    Minutes  17    METs  2.5      Track   Laps  12    Minutes  17       Social History   Tobacco Use  Smoking Status Former Smoker  . Packs/day: 2.00  . Years: 20.00  . Pack years: 40.00  . Types: Cigarettes  . Last attempt to quit: 04/06/1984  . Years since quitting: 33.3  Smokeless Tobacco Never Used    Goals Met:  Exercise tolerated well No report of cardiac concerns or symptoms Strength training completed today  Goals Unmet:  Not Applicable  Comments: Service time is from 10:30a to 12:00p    Dr. Rush Farmer is Medical Director for Pulmonary Rehab at [facility].

## 2017-08-19 ENCOUNTER — Encounter (HOSPITAL_COMMUNITY)
Admission: RE | Admit: 2017-08-19 | Discharge: 2017-08-19 | Disposition: A | Discharge status: Home / Self Care | Payer: Medicare Other | Source: Ambulatory Visit | Attending: Internal Medicine | Admitting: Internal Medicine

## 2017-08-19 DIAGNOSIS — J449 Chronic obstructive pulmonary disease, unspecified: Secondary | ICD-10-CM | POA: Diagnosis not present

## 2017-08-19 NOTE — Progress Notes (Signed)
Daily Session Note  Patient Details  Name: [name] MRN: [id number] Date of Birth: 01/23/1941 Referring Provider:     Pulmonary Rehab Walk Test from 08/10/2017 in [facility]  Referring Provider  Dr. Wert      Encounter Date: 08/19/2017  Check In: Session Check In - 08/19/17 1114      Check-In   Location  [facility]    Staff Present  Molly DiVincenzo, MS, ACSM RCEP, Exercise Physiologist;Lisa Hughes, RN;Annedrea Stackhouse, RN, MHA    Supervising physician immediately available to respond to emergencies  [facility] immediately available    Physician(s)  Dr. Adhikari    Medication changes reported      No    Fall or balance concerns reported     No    Tobacco Cessation  No Change    Warm-up and Cool-down  Performed as group-led instruction    Resistance Training Performed  Yes    VAD Patient?  No      Pain Assessment   Currently in Pain?  No/denies    Multiple Pain Sites  No       Capillary Blood Glucose: No results found for this or any previous visit (from the past 24 hour(s)).    Social History   Tobacco Use  Smoking Status Former Smoker  . Packs/day: 2.00  . Years: 20.00  . Pack years: 40.00  . Types: Cigarettes  . Last attempt to quit: 04/06/1984  . Years since quitting: 33.3  Smokeless Tobacco Never Used    Goals Met:  Exercise tolerated well No report of cardiac concerns or symptoms Strength training completed today  Goals Unmet:  Not Applicable  Comments: Service time is from 10:30a to 12:25p    Dr. Wesam G. Yacoub is Medical Director for Pulmonary Rehab at [facility]. 

## 2017-08-24 ENCOUNTER — Encounter (HOSPITAL_COMMUNITY): Payer: Medicare Other

## 2017-08-26 ENCOUNTER — Encounter (HOSPITAL_COMMUNITY)
Admission: RE | Admit: 2017-08-26 | Discharge: 2017-08-26 | Disposition: A | Discharge status: Home / Self Care | Payer: Medicare Other | Source: Ambulatory Visit | Attending: Internal Medicine | Admitting: Internal Medicine

## 2017-08-26 DIAGNOSIS — J449 Chronic obstructive pulmonary disease, unspecified: Secondary | ICD-10-CM

## 2017-08-26 NOTE — Progress Notes (Signed)
Daily Session Note  Patient Details  Name: [name] MRN: [id number] Date of Birth: 1941/01/20 Referring Provider:     Pulmonary Rehab Walk Test from 08/10/2017 in [place]  Referring Provider  Dr. Melvyn Novas      Encounter Date: 08/26/2017  Check In:   Capillary Blood Glucose: No results found for this or any previous visit (from the past 24 hour(s)).    Social History   Tobacco Use  Smoking Status Former Smoker  . Packs/day: 2.00  . Years: 20.00  . Pack years: 40.00  . Types: Cigarettes  . Last attempt to quit: 04/06/1984  . Years since quitting: 33.4  Smokeless Tobacco Never Used    Goals Met:  Exercise tolerated well No report of cardiac concerns or symptoms Strength training completed today  Goals Unmet:  Not Applicable  Comments: Service time is from 10:30a to 12:00p    Dr. Rush Farmer is Medical Director for Pulmonary Rehab at [facility].

## 2017-08-31 ENCOUNTER — Encounter (HOSPITAL_COMMUNITY)
Admission: RE | Admit: 2017-08-31 | Discharge: 2017-08-31 | Disposition: A | Discharge status: Home / Self Care | Payer: Medicare Other | Source: Ambulatory Visit | Attending: Internal Medicine | Admitting: Internal Medicine

## 2017-08-31 VITALS — Wt 164.9 lb

## 2017-08-31 DIAGNOSIS — J449 Chronic obstructive pulmonary disease, unspecified: Secondary | ICD-10-CM | POA: Diagnosis not present

## 2017-08-31 NOTE — Progress Notes (Signed)
Daily Session Note  Patient Details  Name: [name] MRN: [id number] Date of Birth: Jul 20, 1940 Referring Provider:     Pulmonary Rehab Walk Test from 08/10/2017 in [place]  Referring Provider  Dr. Melvyn Novas      Encounter Date: 08/31/2017  Check In: Session Check In - 08/31/17 1211      Check-In   Location  [facility]    Staff Present  Cloyde Reams Zenora Karpel, MS, ACSM RCEP, Exercise Physiologist;Annedrea Rosezella [place], RN, MHA;Olinty Celesta Aver, MS, ACSM CEP, Exercise Physiologist    Supervising physician immediately available to respond to emergencies  [facility] immediately available    Physician(s)  Dr. Wyline Copas    Medication changes reported      No    Fall or balance concerns reported     No    Tobacco Cessation  No Change    Warm-up and Cool-down  Performed as group-led instruction    Resistance Training Performed  Yes    VAD Patient?  No      Pain Assessment   Currently in Pain?  No/denies    Multiple Pain Sites  No       Capillary Blood Glucose: No results found for this or any previous visit (from the past 24 hour(s)).    Social History   Tobacco Use  Smoking Status Former Smoker  . Packs/day: 2.00  . Years: 20.00  . Pack years: 40.00  . Types: Cigarettes  . Last attempt to quit: 04/06/1984  . Years since quitting: 33.4  Smokeless Tobacco Never Used    Goals Met:  Exercise tolerated well No report of cardiac concerns or symptoms Strength training completed today  Goals Unmet:  Not Applicable  Comments: Service time is from 10:30a to 12:00p    Dr. Rush Farmer is Medical Director for Pulmonary Rehab at [facility].

## 2017-09-02 ENCOUNTER — Encounter (HOSPITAL_COMMUNITY)
Admission: RE | Admit: 2017-09-02 | Discharge: 2017-09-02 | Disposition: A | Discharge status: Home / Self Care | Payer: Medicare Other | Source: Ambulatory Visit | Attending: Internal Medicine | Admitting: Internal Medicine

## 2017-09-02 DIAGNOSIS — J449 Chronic obstructive pulmonary disease, unspecified: Secondary | ICD-10-CM | POA: Diagnosis not present

## 2017-09-02 NOTE — Progress Notes (Signed)
Daily Session Note  Patient Details  Name: KEILANA MORLOCK MRN: [id number] Date of Birth: 26-Jan-1941 Referring Provider:     Pulmonary Rehab Walk Test from 08/10/2017 in [place]  Referring Provider  Dr. Melvyn Novas      Encounter Date: 09/02/2017  Check In: Session Check In - 09/02/17 1344      Check-In   Location  [facility]       Capillary Blood Glucose: No results found for this or any previous visit (from the past 24 hour(s)).    Social History   Tobacco Use  Smoking Status Former Smoker  . Packs/day: 2.00  . Years: 20.00  . Pack years: 40.00  . Types: Cigarettes  . Last attempt to quit: 04/06/1984  . Years since quitting: 33.4  Smokeless Tobacco Never Used    Goals Met:  Exercise tolerated well No report of cardiac concerns or symptoms Strength training completed today  Goals Unmet:  Not Applicable  Comments: Service time is from 10:30A to 12:30P    Dr. Rush Farmer is Medical Director for Pulmonary Rehab at [facility].

## 2017-09-02 NOTE — Progress Notes (Signed)
[name] 77 y.o. female   DOB: September 26, 1940 MRN: [id number]          Nutrition 1. COPD mixed type [facility])    Past Medical History:  Diagnosis Date  . Compression fracture    T9  . COPD (chronic obstructive pulmonary disease) (HCC)   . Coronary artery disease 08/14/2009   a. 08/2009: Ant MI c/b v-fib arrest, s/p PTCA/BMS to LAD. (STENT Placement); b. 2013 Myoview: no ischemia; c. 11/2012 Cath: patent mLAD stent, RI 70 (stable), otw nonobs dzs, EF 65%;  d. 07/2016 Lexiscan MV: EF 56%, no ischemia/infarct.  Marland Kitchen GERD (gastroesophageal reflux disease)   . HTN (hypertension)   . Hyperlipidemia   . Ischemic cardiomyopathy    a. EF 40-45% in 08/2009 at time of MI. b. Improved to 55-60% by June 2011; c. 03/2016 Echo: EF 55-60%, no rwma, Gr1 DD, mild LVH.  . Osteoarthritis   . Osteopenia   . PONV (postoperative nausea and vomiting)   . Postoperative groin pseudoaneurysm (HCC)    a. 04/2010 following diagnostic cardiac cath, s/p compression.  . Rib fractures    left sided second and sixth rib   . Shingles   . TIA (transient ischemic attack)    a. 03/2016 MRI/MRA negative/unremarkable.  . Ventricular fibrillation (HCC)    a. 08/2009: due to anterior MI.   Meds reviewed.   Ht: Ht Readings from Last 1 Encounters:  08/11/17 5' 5.5" (1.664 m)     Wt:  Wt Readings from Last 3 Encounters:  08/31/17 164 lb 14.5 oz (74.8 kg)  08/17/17 165 lb 5.5 oz (75 kg)  08/11/17 166 lb (75.3 kg)     BMI: 27.19    Current tobacco use? No  Labs:  Lipid Panel     Component Value Date/Time   CHOL 158 03/17/2016 0243   TRIG 72 03/17/2016 0243   HDL 82 03/17/2016 0243   CHOLHDL 1.9 03/17/2016 0243   VLDL 14 03/17/2016 0243   LDLCALC 62 03/17/2016 0243   LDLDIRECT 134.9 11/21/2007 1028    Lab Results  Component Value Date   HGBA1C 5.8 (H) 03/17/2016   Note Spoke with pt. Pt is making healthy food choices the majority of the time.  Pt's Rate Your Plate results reviewed with pt. Age-appropriate  nutrition recommendations discussed. Pt states she wants to lose wt, but is not actively planning on doing anything to lose wt at this time. Pt expressed understanding of the information reviewed via feedback method.    Nutrition Diagnosis ? Food-and nutrition-related knowledge deficit related to lack of exposure to information as related to diagnosis of pulmonary disease  Nutrition Intervention ? Pt's individual nutrition plan and goals reviewed with pt. ? Benefits of adopting healthy eating habits discussed when pt's Rate Your Plate reviewed.  Goal(s) 1. Describe the benefit of including fruits, vegetables, whole grains, and low-fat dairy products in a healthy meal plan.  Plan:  Pt to attend Pulmonary Nutrition class Will provide client-centered nutrition education as part of interdisciplinary care.   Monitor and evaluate progress toward nutrition goal with team.  Monitor and Evaluate progress toward nutrition goal with team.   Mickle Plumb, M.Ed, RD, LDN, CDE 09/02/2017 12:31 PM

## 2017-09-06 NOTE — Progress Notes (Signed)
[name] 77 y.o. female  30 day  Psychosocial Note  Patient psychosocial assessment reveals no barriers to participation in Pulmonary Rehab.  [name] has not worries about housing,  Pt is financial sound and has needed resources, Pt has reliable transportation and is able to drive herself to pulmonary rehab, pt is retired from work, pt makes appropriate treatment decisions, denies any amily problems, no issues with dealing with children, no issues when dealing with spouse, no family health issues, and  No emotional problems as evidenced by signs of depression, complaint of fear, no signs of nervousness, displays no sadness,worry, loss of interest in usual activities, and  Nor spiritual/religious concerns. Patient does continue to exhibit positive coping skills to deal with her psychosocial concerns. Offered emotional support and reassurance. Patient does feel she is making progress toward Pulmonary Rehab goals. Pt is planning to attending her grandson upcoming high school graduation in [place]. Patient reports her health and activity level have improved slightly in the past 2 weeks of her participation in pulmonary rehab. as evidenced by patient's report of slight increase in her  ability to exert and participate in activities without shortness of breath. Patient states family/friends have noticed changes in her activity or mood within the last two weeks of participation in pulmonary rehab.. Patient reports  feeling positive about current and projected progression in Pulmonary Rehab. After reviewing the patient's treatment plan, the patient is making progress toward Pulmonary Rehab goals. Pt has increased her laps completed in 15 minutes from 12 to 16 laps. Pt also increased her nustep level from 2 to 3 in a very short period of time Patient's rate of progress toward rehab goals is excellent. Plan of action to help patient continue to work towards rehab goals include going to the beach later this month for  vacation. Will continue to monitor and evaluate progress toward psychosocial goal(s).  Goal(s) in progress: Continue to maintain healthy and positive outlook on life, demonstrate positive and healthy coing skills. Help patient work toward turning to meaningful activities that improve patient's QOL and are attainable with patient's lung disease [name] [name] RN, BSN Cardiac and Emergency planning/management officerulmonary Rehab Nurse Navigator

## 2017-09-06 NOTE — Progress Notes (Signed)
Pulmonary Individual Treatment Plan  Patient Details  Name: [name] MRN: [id number] Date of Birth: 27-Mar-1941 Referring Provider:     Pulmonary Rehab Walk Test from 08/10/2017 in [place]  Referring Provider  Dr. Melvyn Novas      Initial Encounter Date:    Pulmonary Rehab Walk Test from 08/10/2017 in [place]  Date  08/12/17  Referring Provider  Dr. Melvyn Novas      Visit Diagnosis: COPD mixed type [facility])  Patient's Home Medications on Admission:   Current Outpatient Medications:  .  aspirin EC 81 MG tablet, Take 1 tablet (81 mg total) daily by mouth., Disp: 90 tablet, Rfl: 3 .  calcium carbonate (OS-CAL) 600 MG TABS tablet, Take 600 mg by mouth 2 (two) times daily with a meal. , Disp: , Rfl:  .  Cholecalciferol (VITAMIN D3) 1000 units CAPS, Take 1,000 Units by mouth daily., Disp: , Rfl:  .  Glycopyrrolate-Formoterol (BEVESPI AEROSPHERE) 9-4.8 MCG/ACT AERO, Inhale 2 puffs into the lungs 2 (two) times daily., Disp: 1 Inhaler, Rfl: 11 .  loratadine (CLARITIN) 10 MG tablet, Take 10 mg by mouth daily as needed. , Disp: , Rfl:  .  losartan (COZAAR) 50 MG tablet, TAKE 1 TABLET (50 MG TOTAL) DAILY BY MOUTH., Disp: 90 tablet, Rfl: 1 .  metoprolol succinate (TOPROL-XL) 25 MG 24 hr tablet, Take 1 tablet by mouth daily., Disp: , Rfl:  .  nitroGLYCERIN (NITROSTAT) 0.4 MG SL tablet, Place 1 tablet (0.4 mg total) under the tongue every 5 (five) minutes as needed. For chest pain, Disp: 25 tablet, Rfl: 1 .  pantoprazole (PROTONIX) 40 MG tablet, Take 1 tablet by mouth daily., Disp: , Rfl:  .  rosuvastatin (CRESTOR) 20 MG tablet, Take 1 tablet (20 mg total) by mouth at bedtime., Disp: 90 tablet, Rfl: 3 .  traMADol (ULTRAM) 50 MG tablet, Take 50 mg by mouth every 6 (six) hours as needed., Disp: , Rfl:  .  Zoledronic Acid (RECLAST IV), Inject into the vein. Once Yearly, Disp: , Rfl:   Past Medical History: Past Medical History:  Diagnosis Date   . Compression fracture    T9  . COPD (chronic obstructive pulmonary disease) ([place])   . Coronary artery disease 08/14/2009   a. 08/2009: Ant MI c/b v-fib arrest, s/p PTCA/BMS to LAD. (STENT Placement); b. 2013 Myoview: no ischemia; c. 11/2012 Cath: patent mLAD stent, RI 70 (stable), otw nonobs dzs, EF 65%;  d. 07/2016 Lexiscan MV: EF 56%, no ischemia/infarct.  [place] GERD (gastroesophageal reflux disease)   . HTN (hypertension)   . Hyperlipidemia   . Ischemic cardiomyopathy    a. EF 40-45% in 08/2009 at time of MI. b. Improved to 55-60% by June 2011; c. 03/2016 Echo: EF 55-60%, no rwma, Gr1 DD, mild LVH.  . Osteoarthritis   . Osteopenia   . PONV (postoperative nausea and vomiting)   . Postoperative groin pseudoaneurysm ([place])    a. 04/2010 following diagnostic cardiac cath, s/p compression.  . Rib fractures    left sided second and sixth rib   . Shingles   . TIA (transient ischemic attack)    a. 03/2016 MRI/MRA negative/unremarkable.  . Ventricular fibrillation ([place])    a. 08/2009: due to anterior MI.    Tobacco Use: Social History   Tobacco Use  Smoking Status Former Smoker  . Packs/day: 2.00  . Years: 20.00  . Pack years: 40.00  . Types: Cigarettes  . Last attempt to  quit: 04/06/1984  . Years since quitting: 33.4  Smokeless Tobacco Never Used    Labs: Recent Chemical engineer    Labs for ITP Cardiac and Pulmonary Rehab Latest Ref Rng & Units 05/21/2010 11/20/2010 11/15/2012 03/16/2016 03/17/2016   Cholestrol 0 - 200 mg/dL 157 176 - - 158   LDLCALC 0 - 99 mg/dL 58 54 - - 62   LDLDIRECT mg/dL - - - - -   HDL >40 mg/dL 92.20 111.60 - - 82   Trlycerides <150 mg/dL 36.0 50.0 - - 72   Hemoglobin A1c 4.8 - 5.6 % - - - - 5.8(H)   TCO2 0 - 100 mmol/L - - 28 30 -      Capillary Blood Glucose: Lab Results  Component Value Date   GLUCAP 99 03/16/2016     Pulmonary Assessment Scores: Pulmonary Assessment Scores    Row Name 08/10/17 1825 08/12/17 0758       ADL UCSD   ADL  Phase  Entry  Entry    SOB Score total  65  -      CAT Score   CAT Score  20 Entry  -      mMRC Score   mMRC Score  -  2       Pulmonary Function Assessment: Pulmonary Function Assessment - 07/30/17 1147      Breath   Bilateral Breath Sounds  Clear    Shortness of Breath  Yes;Limiting activity       Exercise Target Goals:    Exercise Program Goal: Individual exercise prescription set using results from initial 6 min walk test and THRR while considering  patient's activity barriers and safety.    Exercise Prescription Goal: Initial exercise prescription builds to 30-45 minutes a day of aerobic activity, 2-3 days per week.  Home exercise guidelines will be given to patient during program as part of exercise prescription that the participant will acknowledge.  Activity Barriers & Risk Stratification: Activity Barriers & Cardiac Risk Stratification - 07/30/17 1028      Activity Barriers & Cardiac Risk Stratification   Activity Barriers  Shortness of Breath;Balance Concerns    Cardiac Risk Stratification  Moderate       6 Minute Walk: 6 Minute Walk    Row Name 08/12/17 0737         6 Minute Walk   Phase  Initial     Distance  1000 feet     Walk Time  6 minutes     # of Rest Breaks  0     MPH  1.89     METS  2.45     RPE  13     Perceived Dyspnea   2     Symptoms  No     Resting HR  59 bpm     Resting BP  120/80     Resting Oxygen Saturation   95 %     Exercise Oxygen Saturation  during 6 min walk  90 %     Max Ex. HR  90 bpm     Max Ex. BP  144/80     2 Minute Post BP  128/80       Interval HR   1 Minute HR  68     2 Minute HR  83     3 Minute HR  87     4 Minute HR  90     5 Minute HR  90  6 Minute HR  70     2 Minute Post HR  63     Interval Heart Rate?  Yes       Interval Oxygen   Interval Oxygen?  Yes     Baseline Oxygen Saturation %  95 %     1 Minute Oxygen Saturation %  94 %     1 Minute Liters of Oxygen  0 L     2 Minute Oxygen  Saturation %  95 %     2 Minute Liters of Oxygen  0 L     3 Minute Oxygen Saturation %  90 %     3 Minute Liters of Oxygen  0 L     4 Minute Oxygen Saturation %  90 %     4 Minute Liters of Oxygen  0 L     5 Minute Oxygen Saturation %  90 %     5 Minute Liters of Oxygen  0 L     6 Minute Oxygen Saturation %  92 %     6 Minute Liters of Oxygen  0 L     2 Minute Post Oxygen Saturation %  95 %     2 Minute Post Liters of Oxygen  0 L        Oxygen Initial Assessment: Oxygen Initial Assessment - 08/12/17 0737      Initial 6 min Walk   Oxygen Used  None      Program Oxygen Prescription   Program Oxygen Prescription  None       Oxygen Re-Evaluation: Oxygen Re-Evaluation    Row Name 09/07/17 0749             Program Oxygen Prescription   Program Oxygen Prescription  None         Home Oxygen   Home Oxygen Device  None       Sleep Oxygen Prescription  None       Home Exercise Oxygen Prescription  None       Home at Rest Exercise Oxygen Prescription  None          Oxygen Discharge (Final Oxygen Re-Evaluation): Oxygen Re-Evaluation - 09/07/17 0749      Program Oxygen Prescription   Program Oxygen Prescription  None      Home Oxygen   Home Oxygen Device  None    Sleep Oxygen Prescription  None    Home Exercise Oxygen Prescription  None    Home at Rest Exercise Oxygen Prescription  None       Initial Exercise Prescription: Initial Exercise Prescription - 08/12/17 0700      Date of Initial Exercise RX and Referring Provider   Date  08/12/17    Referring Provider  Dr. Melvyn Novas      Recumbant Bike   Level  1    Watts  10    Minutes  17      NuStep   Level  2    SPM  80    Minutes  17    METs  1.5      Track   Laps  5    Minutes  17      Prescription Details   Frequency (times per week)  2    Duration  Progress to 45 minutes of aerobic exercise without signs/symptoms of physical distress      Intensity   THRR 40-80% of Max Heartrate  57-114    Ratings  of Perceived  Exertion  11-13    Perceived Dyspnea  0-4      Progression   Progression  Continue progressive overload as per policy without signs/symptoms or physical distress.      Resistance Training   Training Prescription  Yes    Weight  orange bands    Reps  10-15       Perform Capillary Blood Glucose checks as needed.  Exercise Prescription Changes:  Exercise Prescription Changes    Row Name 08/17/17 1200             Response to Exercise   Blood Pressure (Admit)  106/70       Blood Pressure (Exercise)  140/80       Blood Pressure (Exit)  110/60       Heart Rate (Admit)  65 bpm       Heart Rate (Exercise)  94 bpm       Heart Rate (Exit)  99 bpm       Oxygen Saturation (Admit)  95 %       Oxygen Saturation (Exercise)  91 %       Oxygen Saturation (Exit)  98 %       Rating of Perceived Exertion (Exercise)  14       Perceived Dyspnea (Exercise)  2       Duration  Progress to 45 minutes of aerobic exercise without signs/symptoms of physical distress       Intensity  THRR unchanged         Progression   Progression  Continue to progress workloads to maintain intensity without signs/symptoms of physical distress.         Resistance Training   Training Prescription  Yes       Weight  orange bands       Reps  10-15         Recumbant Bike   Level  2       Watts  10       Minutes  17         NuStep   Level  2       SPM  80       Minutes  17       METs  2.5         Track   Laps  12       Minutes  17          Exercise Comments:   Exercise Goals and Review:  Exercise Goals    Row Name 07/30/17 1034 07/30/17 1133           Exercise Goals   Increase Physical Activity  Yes  -      Intervention  Provide advice, education, support and counseling about physical activity/exercise needs.;Develop an individualized exercise prescription for aerobic and resistive training based on initial evaluation findings, risk stratification, comorbidities and  participant's personal goals.  -      Expected Outcomes  Short Term: Attend rehab on a regular basis to increase amount of physical activity.;Long Term: Add in home exercise to make exercise part of routine and to increase amount of physical activity.;Long Term: Exercising regularly at least 3-5 days a week.  Short Term: Attend rehab on a regular basis to increase amount of physical activity.;Long Term: Add in home exercise to make exercise part of routine and to increase amount of physical activity.;Long Term: Exercising regularly at least 3-5 days a week.      Increase  Strength and Stamina  Yes  -      Intervention  Provide advice, education, support and counseling about physical activity/exercise needs.;Develop an individualized exercise prescription for aerobic and resistive training based on initial evaluation findings, risk stratification, comorbidities and participant's personal goals.  -      Expected Outcomes  Short Term: Increase workloads from initial exercise prescription for resistance, speed, and METs.;Short Term: Perform resistance training exercises routinely during rehab and add in resistance training at home;Long Term: Improve cardiorespiratory fitness, muscular endurance and strength as measured by increased METs and functional capacity (6MWT)  -      Able to understand and use rate of perceived exertion (RPE) scale  Yes  -      Intervention  Provide education and explanation on how to use RPE scale  -      Expected Outcomes  Short Term: Able to use RPE daily in rehab to express subjective intensity level;Long Term:  Able to use RPE to guide intensity level when exercising independently  -      Able to understand and use Dyspnea scale  Yes  -      Intervention  Provide education and explanation on how to use Dyspnea scale  -      Expected Outcomes  Short Term: Able to use Dyspnea scale daily in rehab to express subjective sense of shortness of breath during exertion;Long Term: Able to  use Dyspnea scale to guide intensity level when exercising independently  -      Knowledge and understanding of Target Heart Rate Range (THRR)  Yes  -      Intervention  Provide education and explanation of THRR including how the numbers were predicted and where they are located for reference  -      Expected Outcomes  Short Term: Able to state/look up THRR;Long Term: Able to use THRR to govern intensity when exercising independently;Short Term: Able to use daily as guideline for intensity in rehab  -      Understanding of Exercise Prescription  Yes  -      Intervention  Provide education, explanation, and written materials on patient's individual exercise prescription  -      Expected Outcomes  Short Term: Able to explain program exercise prescription;Long Term: Able to explain home exercise prescription to exercise independently  -         Exercise Goals Re-Evaluation : Exercise Goals Re-Evaluation    Row Name 09/07/17 0749             Exercise Goal Re-Evaluation   Exercise Goals Review  Increase Strength and Stamina;Able to understand and use Dyspnea scale;Increase Physical Activity;Able to understand and use rate of perceived exertion (RPE) scale;Knowledge and understanding of Target Heart Rate Range (THRR);Understanding of Exercise Prescription       Comments  Patient has only attended 5 rehab sessions. She is progressing well, however. She is able to walk up to 16 laps (200 ft each) in 15 minutes. Patient is open to working at higher workloads. Will cont. to monitor and motivate. I will conduct home exercise prescription so the patient knows what exactly to do at home.        Expected Outcomes  Through exercise at rehab and at home, the patient will decrease shortness of breath with daily activities and feel confident in carrying out an exercise regime at home.           Discharge Exercise Prescription (Final Exercise Prescription Changes): Exercise  Prescription Changes - 08/17/17  1200      Response to Exercise   Blood Pressure (Admit)  106/70    Blood Pressure (Exercise)  140/80    Blood Pressure (Exit)  110/60    Heart Rate (Admit)  65 bpm    Heart Rate (Exercise)  94 bpm    Heart Rate (Exit)  99 bpm    Oxygen Saturation (Admit)  95 %    Oxygen Saturation (Exercise)  91 %    Oxygen Saturation (Exit)  98 %    Rating of Perceived Exertion (Exercise)  14    Perceived Dyspnea (Exercise)  2    Duration  Progress to 45 minutes of aerobic exercise without signs/symptoms of physical distress    Intensity  THRR unchanged      Progression   Progression  Continue to progress workloads to maintain intensity without signs/symptoms of physical distress.      Resistance Training   Training Prescription  Yes    Weight  orange bands    Reps  10-15      Recumbant Bike   Level  2    Watts  10    Minutes  17      NuStep   Level  2    SPM  80    Minutes  17    METs  2.5      Track   Laps  12    Minutes  17       Nutrition:  Target Goals: Understanding of nutrition guidelines, daily intake of sodium '1500mg'$ , cholesterol '200mg'$ , calories 30% from fat and 7% or less from saturated fats, daily to have 5 or more servings of fruits and vegetables.  Biometrics:    Nutrition Therapy Plan and Nutrition Goals: Nutrition Therapy & Goals - 09/02/17 1234      Nutrition Therapy   Diet  Heart Healthy      Personal Nutrition Goals   Nutrition Goal  Describe the benefit of including fruits, vegetables, whole grains, and low-fat dairy products in a healthy meal plan.      Intervention Plan   Intervention  Prescribe, educate and counsel regarding individualized specific dietary modifications aiming towards targeted core components such as weight, hypertension, lipid management, diabetes, heart failure and other comorbidities.    Expected Outcomes  Short Term Goal: Understand basic principles of dietary content, such as calories, fat, sodium, cholesterol and  nutrients.;Long Term Goal: Adherence to prescribed nutrition plan.       Nutrition Assessments: Nutrition Assessments - 08/11/17 1514      Rate Your Plate Scores   Pre Score  55       Nutrition Goals Re-Evaluation:   Nutrition Goals Discharge (Final Nutrition Goals Re-Evaluation):   Psychosocial: Target Goals: Acknowledge presence or absence of significant depression and/or stress, maximize coping skills, provide positive support system. Participant is able to verbalize types and ability to use techniques and skills needed for reducing stress and depression.  Initial Review & Psychosocial Screening: Initial Psych Review & Screening - 07/30/17 1154      Family Dynamics   Comments  Pt has help from spouse but he has limited mobility and uses a walker.  Pt children live [place] of state.  Pt freinds are also elderly.  Pt feesl she manages the best you can.  (Pended)        Quality of Life Scores:  Scores of 19 and below usually indicate a poorer quality of life in these areas.  A difference of  2-3 points is a clinically meaningful difference.  A difference of 2-3 points in the total score of the Quality of Life Index has been associated with significant improvement in overall quality of life, self-image, physical symptoms, and general health in studies assessing change in quality of life.   PHQ-9: Recent Review Flowsheet Data    Depression screen [facility] 2/9 07/30/2017   Decreased Interest 1   Down, Depressed, Hopeless 1   PHQ - 2 Score 2   Altered sleeping 1   Tired, decreased energy 1   Change in appetite 0   Feeling bad or failure about yourself  1   Trouble concentrating 0   Moving slowly or fidgety/restless 0   Suicidal thoughts 0   PHQ-9 Score 5   Difficult doing work/chores Not difficult at all     Interpretation of Total Score  Total Score Depression Severity:  1-4 = Minimal depression, 5-9 = Mild depression, 10-14 = Moderate depression, 15-19 = Moderately severe  depression, 20-27 = Severe depression   Psychosocial Evaluation and Intervention: Psychosocial Evaluation - 09/06/17 1403      Psychosocial Evaluation & Interventions   Interventions  Stress management education;Relaxation education;Encouraged to exercise with the program and follow exercise prescription    Comments  Pt reports every day stress but does not feel this is more than she can manage.    Expected Outcomes  positive and healthy coping skills and learned techniques for stress managment    Continue Psychosocial Services   No Follow up required       Psychosocial Re-Evaluation: Psychosocial Re-Evaluation    [place] Name 09/06/17 1410 09/06/17 1411           Psychosocial Re-Evaluation   Current issues with  -  None Identified      Comments  -  Pt does not identify any reportable pyschosocial issues.      Expected Outcomes  -  Pt demonstrate positive and healthy coping skills.      Interventions  Relaxation education;Stress management education;Encouraged to attend Pulmonary Rehabilitation for the exercise  -      Continue Psychosocial Services   Follow up required by counselor  -         Psychosocial Discharge (Final Psychosocial Re-Evaluation): Psychosocial Re-Evaluation - 09/06/17 1411      Psychosocial Re-Evaluation   Current issues with  None Identified    Comments  Pt does not identify any reportable pyschosocial issues.    Expected Outcomes  Pt demonstrate positive and healthy coping skills.       Education: Education Goals: Education classes will be provided on a weekly basis, covering required topics. Participant will state understanding/return demonstration of topics presented.  Learning Barriers/Preferences: Learning Barriers/Preferences - 07/30/17 1026      Learning Barriers/Preferences   Learning Barriers  Sight;Hearing wears glasses  wears hearing aids    Learning Preferences  Written Material;Pictoral;Group Instruction       Education Topics: Risk  Factor Reduction:  -Group instruction that is supported by a PowerPoint presentation. Instructor discusses the definition of a risk factor, different risk factors for pulmonary disease, and how the heart and lungs work together.     Nutrition for Pulmonary Patient:  -Group instruction provided by PowerPoint slides, verbal discussion, and written materials to support subject matter. The instructor gives an explanation and review of healthy diet recommendations, which includes a discussion on weight management, recommendations for fruit and vegetable consumption, as well as protein, fluid, caffeine,  fiber, sodium, sugar, and alcohol. Tips for eating when patients are short of breath are discussed.   PULMONARY REHAB CHRONIC OBSTRUCTIVE PULMONARY DISEASE from 09/02/2017 in [place]  Date  08/19/17  Educator  edna  Instruction Review Code  2- Demonstrated Understanding      Pursed Lip Breathing:  -Group instruction that is supported by demonstration and informational handouts. Instructor discusses the benefits of pursed lip and diaphragmatic breathing and detailed demonstration on how to preform both.     Oxygen Safety:  -Group instruction provided by PowerPoint, verbal discussion, and written material to support subject matter. There is an overview of "What is Oxygen" and "Why do we need it".  Instructor also reviews how to create a safe environment for oxygen use, the importance of using oxygen as prescribed, and the risks of noncompliance. There is a brief discussion on traveling with oxygen and resources the patient may utilize.   Oxygen Equipment:  -Group instruction provided by [facility] Staff utilizing handouts, written materials, and equipment demonstrations.   Signs and Symptoms:  -Group instruction provided by written material and verbal discussion to support subject matter. Warning signs and symptoms of infection, stroke, and heart attack are  reviewed and when to call the physician/911 reinforced. Tips for preventing the spread of infection discussed.   Advanced Directives:  -Group instruction provided by verbal instruction and written material to support subject matter. Instructor reviews Advanced Directive laws and proper instruction for filling out document.   Pulmonary Video:  -Group video education that reviews the importance of medication and oxygen compliance, exercise, good nutrition, pulmonary hygiene, and pursed lip and diaphragmatic breathing for the pulmonary patient.   Exercise for the Pulmonary Patient:  -Group instruction that is supported by a PowerPoint presentation. Instructor discusses benefits of exercise, core components of exercise, frequency, duration, and intensity of an exercise routine, importance of utilizing pulse oximetry during exercise, safety while exercising, and options of places to exercise outside of rehab.     Pulmonary Medications:  -Verbally interactive group education provided by instructor with focus on inhaled medications and proper administration.   Anatomy and Physiology of the Respiratory System and Intimacy:  -Group instruction provided by PowerPoint, verbal discussion, and written material to support subject matter. Instructor reviews respiratory cycle and anatomical components of the respiratory system and their functions. Instructor also reviews differences in obstructive and restrictive respiratory diseases with examples of each. Intimacy, Sex, and Sexuality differences are reviewed with a discussion on how relationships can change when diagnosed with pulmonary disease. Common sexual concerns are reviewed.   PULMONARY REHAB CHRONIC OBSTRUCTIVE PULMONARY DISEASE from 09/02/2017 in [place]  Date  09/02/17  Educator  rn  Instruction Review Code  2- Demonstrated Understanding      MD DAY -A group question and answer session with a medical doctor  that allows participants to ask questions that relate to their pulmonary disease state.   OTHER EDUCATION -Group or individual verbal, written, or video instructions that support the educational goals of the pulmonary rehab program.   Holiday Eating Survival Tips:  -Group instruction provided by PowerPoint slides, verbal discussion, and written materials to support subject matter. The instructor gives patients tips, tricks, and techniques to help them not only survive but enjoy the holidays despite the onslaught of food that accompanies the holidays.   Knowledge Questionnaire Score: Knowledge Questionnaire Score - 08/10/17 1824      Knowledge Questionnaire Score   Pre  Score  15/18       Core Components/Risk Factors/Patient Goals at Admission: Personal Goals and Risk Factors at Admission - 07/30/17 1019      Core Components/Risk Factors/Patient Goals on Admission    Weight Management  Weight Loss    Improve shortness of breath with ADL's  Yes    Intervention  Provide education, individualized exercise plan and daily activity instruction to help decrease symptoms of SOB with activities of daily living.    Expected Outcomes  Short Term: Improve cardiorespiratory fitness to achieve a reduction of symptoms when performing ADLs    Hypertension  Yes    Intervention  Provide education on lifestyle modifcations including regular physical activity/exercise, weight management, moderate sodium restriction and increased consumption of fresh fruit, vegetables, and low fat dairy, alcohol moderation, and smoking cessation.;Monitor prescription use compliance.    Expected Outcomes  Short Term: Continued assessment and intervention until BP is < 140/15m HG in hypertensive participants. < 130/81mHG in hypertensive participants with diabetes, heart failure or chronic kidney disease.;Long Term: Maintenance of blood pressure at goal levels.    Lipids  Yes    Intervention  Provide education and support  for participant on nutrition & aerobic/resistive exercise along with prescribed medications to achieve LDL '70mg'$ , HDL >'40mg'$ .    Expected Outcomes  Short Term: Participant states understanding of desired cholesterol values and is compliant with medications prescribed. Participant is following exercise prescription and nutrition guidelines.;Long Term: Cholesterol controlled with medications as prescribed, with individualized exercise RX and with personalized nutrition plan. Value goals: LDL < '70mg'$ , HDL > 40 mg.    Stress  Yes    Intervention  Offer individual and/or small group education and counseling on adjustment to heart disease, stress management and health-related lifestyle change. Teach and support self-help strategies.;Refer participants experiencing significant psychosocial distress to appropriate mental health specialists for further evaluation and treatment. When possible, include family members and significant others in education/counseling sessions.    Expected Outcomes  Short Term: Participant demonstrates changes in health-related behavior, relaxation and other stress management skills, ability to obtain effective social support, and compliance with psychotropic medications if prescribed.;Long Term: Emotional wellbeing is indicated by absence of clinically significant psychosocial distress or social isolation.    Personal Goal Other  Yes    Personal Goal  Be able travel with short trips with ease and no shortness of breath    Expected Outcomes  [place]  [place]       Core Components/Risk Factors/Patient Goals Review:  Goals and Risk Factor Review    Row Name 09/06/17 1348             Core Components/Risk Factors/Patient Goals Review   Personal Goals Review  Stress;Hypertension;Lipids;Improve shortness of breath with ADL's;Weight  Management/Obesity       Review  Pt is fairly new to pulmonary rehab.  However pt is making progress toward her goals.  Pt weight remains stable with no weight gain noted.  Pt met with RD on 5/31 for discussion of strategies and eating plan.  Pt attending nutrition education class on 5/16.Pt continues to desire to lose weight.  Pt BP remain well within normal limits with the expected increase with activity and the return to pt baseline post exercise - 120's -130's/60-70's.  Pt is compliant with her medications.  pt reports she  is using strategies to help with purse lip breathing such as PLB and rest breaks.   Pt is planning to attend her grandson high school graduation in [place].  Will check back in with pt to see how she managed for the trip.  Pt is planning a vacation later this month to [place].  Will check back in with pt for progress toward the ability to travel to the beach for vacation.       Expected Outcomes  See "admission expected outcomes"          Core Components/Risk Factors/Patient Goals at Discharge (Final Review):  Goals and Risk Factor Review - 09/06/17 1348      Core Components/Risk Factors/Patient Goals Review   Personal Goals Review  Stress;Hypertension;Lipids;Improve shortness of breath with ADL's;Weight Management/Obesity    Review  Pt is fairly new to pulmonary rehab.  However pt is making progress toward her goals.  Pt weight remains stable with no weight gain noted.  Pt met with RD on 5/31 for discussion of strategies and eating plan.  Pt attending nutrition education class on 5/16.Pt continues to desire to lose weight.  Pt BP remain well within normal limits with the expected increase with activity and the return to pt baseline post exercise - 120's -130's/60-70's.  Pt is compliant with her medications.  pt reports she is using strategies to help with purse lip breathing such as PLB and rest breaks.   Pt is planning to attend her grandson high school graduation in  [place].  Will check back in with pt to see how she managed for the trip.  Pt is planning a vacation later this month to [place].  Will check back in with pt for progress toward the ability to travel to the beach for vacation.    Expected Outcomes  See "admission expected outcomes"       ITP Comments: ITP Comments    Row Name 07/30/17 1011 09/08/17 1032         ITP Comments  Dr. Jennet Maduro, Medical Director  Dr. Jennet Maduro, Medical Director         Comments: Pt has completed 6 exercise sessions. Cherre Huger, BSN Cardiac and Training and development officer

## 2017-09-07 ENCOUNTER — Encounter (HOSPITAL_COMMUNITY)
Admission: RE | Admit: 2017-09-07 | Discharge: 2017-09-07 | Disposition: A | Discharge status: Home / Self Care | Payer: Medicare Other | Source: Ambulatory Visit | Attending: Internal Medicine | Admitting: Internal Medicine

## 2017-09-07 DIAGNOSIS — J449 Chronic obstructive pulmonary disease, unspecified: Secondary | ICD-10-CM | POA: Insufficient documentation

## 2017-09-07 NOTE — Progress Notes (Signed)
Daily Session Note  Patient Details  Name: [name] MRN: [id number] Date of Birth: July 11, 1940 Referring Provider:     Pulmonary Rehab Walk Test from 08/10/2017 in [place]  Referring Provider  Dr. Melvyn Novas      Encounter Date: 09/07/2017  Check In: Session Check In - 09/07/17 1217      Check-In   Location  [facility]    Staff Present  Su Hilt, MS, ACSM RCEP, Exercise Physiologist;[name] Wilber Oliphant, Therapist, sports, BSN;Ramon Dredge, RN, MHA;Rourke Mcquitty Ysidro Evert, RN    Supervising physician immediately available to respond to emergencies  [facility] immediately available    Physician(s)   Dr. Jonnie Finner    Medication changes reported      No    Fall or balance concerns reported     No    Tobacco Cessation  No Change    Warm-up and Cool-down  Performed as group-led instruction    Resistance Training Performed  Yes    VAD Patient?  No      Pain Assessment   Currently in Pain?  No/denies    Multiple Pain Sites  No       Capillary Blood Glucose: No results found for this or any previous visit (from the past 24 hour(s)).    Social History   Tobacco Use  Smoking Status Former Smoker  . Packs/day: 2.00  . Years: 20.00  . Pack years: 40.00  . Types: Cigarettes  . Last attempt to quit: 04/06/1984  . Years since quitting: 33.4  Smokeless Tobacco Never Used    Goals Met:  Exercise tolerated well No report of cardiac concerns or symptoms Strength training completed today  Goals Unmet:  Not Applicable  Comments: Service time is from 1030 to 1210    Dr. Rush Farmer is Medical Director for Pulmonary Rehab at [facility].

## 2017-09-08 NOTE — Addendum Note (Signed)
Encounter addended by: Alyson ReedyYacoub, Wesam G, MD on: 09/08/2017 7:43 AM  Actions taken: Sign clinical note

## 2017-09-09 ENCOUNTER — Encounter (HOSPITAL_COMMUNITY)
Admission: RE | Admit: 2017-09-09 | Discharge: 2017-09-09 | Disposition: A | Discharge status: Home / Self Care | Payer: Medicare Other | Source: Ambulatory Visit | Attending: Internal Medicine | Admitting: Internal Medicine

## 2017-09-09 DIAGNOSIS — J449 Chronic obstructive pulmonary disease, unspecified: Secondary | ICD-10-CM

## 2017-09-09 NOTE — Progress Notes (Signed)
Daily Session Note  Patient Details  Name: [name] MRN: [id number] Date of Birth: [birth year] Referring Provider:     Pulmonary Rehab Walk Test from 08/10/2017 in [place]  Referring Provider  Dr. Melvyn Novas      Encounter Date: 09/09/2017  Check In: Session Check In - 09/09/17 1119      Check-In   Location  [facility]    Staff Present  Cloyde Reams Jamael Hoffmann, MS, ACSM RCEP, Exercise Physiologist;[name] Wilber Oliphant, Therapist, sports, BSN;Ramon Dredge, RN, MHA;Lisa Ysidro Evert, RN    Supervising physician immediately available to respond to emergencies  [facility] immediately available    Physician(s)  Dr. Tana Coast    Medication changes reported      No    Fall or balance concerns reported     No    Tobacco Cessation  No Change    Warm-up and Cool-down  Performed as group-led instruction    Resistance Training Performed  Yes    VAD Patient?  No      Pain Assessment   Currently in Pain?  No/denies    Multiple Pain Sites  No       Capillary Blood Glucose: No results found for this or any previous visit (from the past 24 hour(s)).    Social History   Tobacco Use  Smoking Status Former Smoker  . Packs/day: 2.00  . Years: 20.00  . Pack years: 40.00  . Types: Cigarettes  . Last attempt to quit: 04/06/1984  . Years since quitting: 33.4  Smokeless Tobacco Never Used    Goals Met:  Exercise tolerated well No report of cardiac concerns or symptoms Strength training completed today  Goals Unmet:  Not Applicable  Comments: Service time is from 10:30A to 12:30P    Dr. Rush Farmer is Medical Director for Pulmonary Rehab at [facility].

## 2017-09-09 NOTE — Progress Notes (Signed)
[name] Lesnick presents to pulmonary rehab for her bi-weekly exercise session. I have completed her thirty day face to face review and determined that [name] Mcdermott is on track for meeting their pulmonary rehab goals. There are no barriers identified that will prevent them from continuing their exercise in pulmonary rehab as prescribed.   Alyson ReedyWesam G. Yacoub, M.D. [facility]. Pager: [phone]. After hours pager: [phone].

## 2017-09-13 ENCOUNTER — Ambulatory Visit: Payer: Medicare Other | Admitting: Internal Medicine

## 2017-09-14 ENCOUNTER — Encounter (HOSPITAL_COMMUNITY)
Admission: RE | Admit: 2017-09-14 | Discharge: 2017-09-14 | Disposition: A | Discharge status: Home / Self Care | Payer: Medicare Other | Source: Ambulatory Visit | Attending: Internal Medicine | Admitting: Internal Medicine

## 2017-09-14 VITALS — Wt 168.2 lb

## 2017-09-14 DIAGNOSIS — J449 Chronic obstructive pulmonary disease, unspecified: Secondary | ICD-10-CM

## 2017-09-14 NOTE — Progress Notes (Signed)
Daily Session Note  Patient Details  Name: [name] MRN: [id number] Date of Birth: 1941-03-03 Referring Provider:     Pulmonary Rehab Walk Test from 08/10/2017 in [place]  Referring Provider  Dr. Melvyn Novas      Encounter Date: 09/14/2017  Check In: Session Check In - 09/14/17 1030      Check-In   Location  [facility]    Staff Present  Rosebud Poles, RN, BSN;Molly DiVincenzo, MS, ACSM RCEP, Exercise Physiologist;Lisa Ysidro Evert, RN;[name] Carlton, RN, Deland Pretty, MS, ACSM CEP, Exercise Physiologist;Annedrea Rosezella [place], RN, [facility]    Supervising physician immediately available to respond to emergencies  [facility] immediately available    Physician(s)  Dr. Tana Coast    Medication changes reported      No    Fall or balance concerns reported     No    Tobacco Cessation  No Change    Warm-up and Cool-down  Performed as group-led instruction    Resistance Training Performed  Yes    VAD Patient?  No      Pain Assessment   Currently in Pain?  No/denies    Multiple Pain Sites  No       Capillary Blood Glucose: No results found for this or any previous visit (from the past 24 hour(s)).  Exercise Prescription Changes - 09/14/17 1300      Response to Exercise   Blood Pressure (Admit)  120/74    Blood Pressure (Exercise)  140/70    Blood Pressure (Exit)  120/70    Heart Rate (Admit)  61 bpm    Heart Rate (Exercise)  96 bpm    Heart Rate (Exit)  62 bpm    Oxygen Saturation (Admit)  96 %    Oxygen Saturation (Exercise)  93 %    Oxygen Saturation (Exit)  93 %    Rating of Perceived Exertion (Exercise)  14    Perceived Dyspnea (Exercise)  2    Duration  Progress to 45 minutes of aerobic exercise without signs/symptoms of physical distress    Intensity  THRR unchanged      Progression   Progression  Continue to progress workloads to maintain intensity without signs/symptoms of physical distress.      Resistance Training   Training Prescription  Yes    Weight  orange bands    Reps  10-15    Time  10 Minutes      Recumbant Bike   Level  3    Watts  10    Minutes  17      NuStep   Level  4    SPM  80    Minutes  17    METs  2.6      Track   Laps  17    Minutes  17       Social History   Tobacco Use  Smoking Status Former Smoker  . Packs/day: 2.00  . Years: 20.00  . Pack years: 40.00  . Types: Cigarettes  . Last attempt to quit: 04/06/1984  . Years since quitting: 33.4  Smokeless Tobacco Never Used    Goals Met:  Exercise tolerated well Strength training completed today  Goals Unmet:  Not Applicable  Comments: Service time is from 1030 to 1225    Dr. Rush Farmer is Medical Director for Pulmonary Rehab at [facility].

## 2017-09-16 ENCOUNTER — Encounter (HOSPITAL_COMMUNITY)
Admission: RE | Admit: 2017-09-16 | Discharge: 2017-09-16 | Disposition: A | Discharge status: Home / Self Care | Payer: Medicare Other | Source: Ambulatory Visit | Attending: Internal Medicine | Admitting: Internal Medicine

## 2017-09-16 DIAGNOSIS — J449 Chronic obstructive pulmonary disease, unspecified: Secondary | ICD-10-CM

## 2017-09-16 NOTE — Progress Notes (Signed)
Daily Session Note  Patient Details  Name: [name] MRN: [id number] Date of Birth: 03/29/1941 Referring Provider:     Pulmonary Rehab Walk Test from 08/10/2017 in [facility]  Referring Provider  Dr. Wert      Encounter Date: 09/16/2017  Check In: Session Check In - 09/16/17 1030      Check-In   Location  [facility]    Staff Present  Joan Behrens, RN, BSN;Molly DiVincenzo, MS, ACSM RCEP, Exercise Physiologist;Lisa Hughes, RN;[name] Carlton, RN, BSN;Annedrea Stackhouse, RN, MHA    Supervising physician immediately available to respond to emergencies  [facility] immediately available    Physician(s)  Dr. Joseph    Medication changes reported      No    Fall or balance concerns reported     No    Tobacco Cessation  No Change    Warm-up and Cool-down  Performed as group-led instruction    Resistance Training Performed  Yes    VAD Patient?  No      Pain Assessment   Currently in Pain?  No/denies    Multiple Pain Sites  No       Capillary Blood Glucose: No results found for this or any previous visit (from the past 24 hour(s)).    Social History   Tobacco Use  Smoking Status Former Smoker  . Packs/day: 2.00  . Years: 20.00  . Pack years: 40.00  . Types: Cigarettes  . Last attempt to quit: 04/06/1984  . Years since quitting: 33.4  Smokeless Tobacco Never Used    Goals Met:  Exercise tolerated well Strength training completed today  Goals Unmet:  Not Applicable  Comments: Service time is from 1030 to 1215.    Dr. Wesam G. Yacoub is Medical Director for Pulmonary Rehab at [facility]. 

## 2017-09-21 ENCOUNTER — Encounter (HOSPITAL_COMMUNITY)
Admission: RE | Admit: 2017-09-21 | Discharge: 2017-09-21 | Disposition: A | Discharge status: Home / Self Care | Payer: Medicare Other | Source: Ambulatory Visit | Attending: Internal Medicine | Admitting: Internal Medicine

## 2017-09-21 VITALS — Wt 166.7 lb

## 2017-09-21 DIAGNOSIS — J449 Chronic obstructive pulmonary disease, unspecified: Secondary | ICD-10-CM | POA: Diagnosis not present

## 2017-09-21 NOTE — Progress Notes (Signed)
Daily Session Note  Patient Details  Name: [name] MRN: [id number] Date of Birth: [birth year] Referring Provider:     Pulmonary Rehab Walk Test from 08/10/2017 in [place]  Referring Provider  Dr. Melvyn Novas      Encounter Date: 09/21/2017  Check In: Session Check In - 09/21/17 1030      Check-In   Location  [facility]    Staff Present  Rosebud Poles, RN, BSN;Molly DiVincenzo, MS, ACSM RCEP, Exercise Physiologist;[name] Wilber Oliphant, Therapist, sports, BSN;Ramon Dredge, RN, [facility]    Supervising physician immediately available to respond to emergencies  [facility] immediately available    Physician(s)  Dr. Broadus John    Medication changes reported      No    Fall or balance concerns reported     No    Tobacco Cessation  No Change    Warm-up and Cool-down  Performed as group-led instruction    Resistance Training Performed  Yes    VAD Patient?  No      Pain Assessment   Currently in Pain?  No/denies    Multiple Pain Sites  No       Capillary Blood Glucose: No results found for this or any previous visit (from the past 24 hour(s)).    Social History   Tobacco Use  Smoking Status Former Smoker  . Packs/day: 2.00  . Years: 20.00  . Pack years: 40.00  . Types: Cigarettes  . Last attempt to quit: 04/06/1984  . Years since quitting: 33.4  Smokeless Tobacco Never Used    Goals Met:  Exercise tolerated well Strength training completed today  Goals Unmet:  Not Applicable  Comments: Service time is from 1030 to 1210    Dr. Rush Farmer is Medical Director for Pulmonary Rehab at [facility].

## 2017-09-23 ENCOUNTER — Encounter (HOSPITAL_COMMUNITY)
Admission: RE | Admit: 2017-09-23 | Discharge: 2017-09-23 | Disposition: A | Discharge status: Home / Self Care | Payer: Medicare Other | Source: Ambulatory Visit | Attending: Internal Medicine | Admitting: Internal Medicine

## 2017-09-23 VITALS — Wt 166.9 lb

## 2017-09-23 DIAGNOSIS — J449 Chronic obstructive pulmonary disease, unspecified: Secondary | ICD-10-CM | POA: Diagnosis not present

## 2017-09-23 NOTE — Progress Notes (Signed)
Daily Session Note  Patient Details  Name: [name] MRN: [id number] Date of Birth: November 16, 1940 Referring Provider:     Pulmonary Rehab Walk Test from 08/10/2017 in [place]  Referring Provider  Dr. Melvyn Novas      Encounter Date: 09/23/2017  Check In: Session Check In - 09/23/17 1030      Check-In   Location  [facility]    Staff Present  Rosebud Poles, RN, BSN;Molly DiVincenzo, MS, ACSM RCEP, Exercise Physiologist;Lisa Ysidro Evert, Felipe Drone, RN, [facility]    Supervising physician immediately available to respond to emergencies  [facility] immediately available    Physician(s)  Dr. Herbert Moors    Medication changes reported      No    Fall or balance concerns reported     No    Tobacco Cessation  No Change    Warm-up and Cool-down  Performed as group-led instruction    Resistance Training Performed  Yes    VAD Patient?  No      Pain Assessment   Currently in Pain?  No/denies    Multiple Pain Sites  No       Capillary Blood Glucose: No results found for this or any previous visit (from the past 24 hour(s)).    Social History   Tobacco Use  Smoking Status Former Smoker  . Packs/day: 2.00  . Years: 20.00  . Pack years: 40.00  . Types: Cigarettes  . Last attempt to quit: 04/06/1984  . Years since quitting: 33.4  Smokeless Tobacco Never Used    Goals Met:  Exercise tolerated well Strength training completed today  Goals Unmet:  Not Applicable  Comments: Service time is from 1030 to 1220   Dr. Rush Farmer is Medical Director for Pulmonary Rehab at [facility].

## 2017-09-24 NOTE — Progress Notes (Signed)
I have reviewed a Home Exercise Prescription with [name] . [name] is not currently exercising at home.  The patient was advised to walk 2 days a week for 30-45 minutes.  [name] and I discussed how to progress their exercise prescription.  The patient stated that their goals were to increase confidence in pushing herself.  The patient stated that they understand the exercise prescription.  We reviewed exercise guidelines, target heart rate during exercise, RPE Scale, weather conditions, NTG use, endpoints for exercise, warmup and cool down.  Patient is encouraged to come to me with any questions. I will continue to follow up with the patient to assist them with progression and safety.

## 2017-09-28 ENCOUNTER — Encounter (HOSPITAL_COMMUNITY): Payer: Medicare Other

## 2017-09-30 ENCOUNTER — Encounter (HOSPITAL_COMMUNITY): Payer: Medicare Other

## 2017-10-05 ENCOUNTER — Encounter (HOSPITAL_COMMUNITY)
Admission: RE | Admit: 2017-10-05 | Discharge: 2017-10-05 | Disposition: A | Discharge status: Home / Self Care | Payer: Medicare Other | Source: Ambulatory Visit | Attending: Internal Medicine | Admitting: Internal Medicine

## 2017-10-05 VITALS — Wt 167.3 lb

## 2017-10-05 DIAGNOSIS — J449 Chronic obstructive pulmonary disease, unspecified: Secondary | ICD-10-CM | POA: Insufficient documentation

## 2017-10-05 NOTE — Progress Notes (Signed)
Pulmonary Individual Treatment Plan  Patient Details  Name: [name] MRN: [id number] Date of Birth: 1940-10-26 Referring Provider:     Pulmonary Rehab Walk Test from 08/10/2017 in [facility]  Referring Provider  Dr. Melvyn Novas      Initial Encounter Date:    Pulmonary Rehab Walk Test from 08/10/2017 in [employer]  Date  08/12/17      Visit Diagnosis: COPD mixed type ([place])  Patient's Home Medications on Admission:   Current Outpatient Medications:  .  aspirin EC 81 MG tablet, Take 1 tablet (81 mg total) daily by mouth., Disp: 90 tablet, Rfl: 3 .  calcium carbonate (OS-CAL) 600 MG TABS tablet, Take 600 mg by mouth 2 (two) times daily with a meal. , Disp: , Rfl:  .  Cholecalciferol (VITAMIN D3) 1000 units CAPS, Take 1,000 Units by mouth daily., Disp: , Rfl:  .  Glycopyrrolate-Formoterol (BEVESPI AEROSPHERE) 9-4.8 MCG/ACT AERO, Inhale 2 puffs into the lungs 2 (two) times daily., Disp: 1 Inhaler, Rfl: 11 .  loratadine (CLARITIN) 10 MG tablet, Take 10 mg by mouth daily as needed. , Disp: , Rfl:  .  losartan (COZAAR) 50 MG tablet, TAKE 1 TABLET (50 MG TOTAL) DAILY BY MOUTH., Disp: 90 tablet, Rfl: 1 .  metoprolol succinate (TOPROL-XL) 25 MG 24 hr tablet, Take 1 tablet by mouth daily., Disp: , Rfl:  .  nitroGLYCERIN (NITROSTAT) 0.4 MG SL tablet, Place 1 tablet (0.4 mg total) under the tongue every 5 (five) minutes as needed. For chest pain, Disp: 25 tablet, Rfl: 1 .  pantoprazole (PROTONIX) 40 MG tablet, Take 1 tablet by mouth daily., Disp: , Rfl:  .  rosuvastatin (CRESTOR) 20 MG tablet, Take 1 tablet (20 mg total) by mouth at bedtime., Disp: 90 tablet, Rfl: 3 .  traMADol (ULTRAM) 50 MG tablet, Take 50 mg by mouth every 6 (six) hours as needed., Disp: , Rfl:  .  Zoledronic Acid (RECLAST IV), Inject into the vein. Once Yearly, Disp: , Rfl:   Past Medical History: Past Medical History:  Diagnosis Date  . Compression fracture    T9   . COPD (chronic obstructive pulmonary disease) ([place])   . Coronary artery disease 08/14/2009   a. 08/2009: Ant MI c/b v-fib arrest, s/p PTCA/BMS to LAD. (STENT Placement); b. 2013 Myoview: no ischemia; c. 11/2012 Cath: patent mLAD stent, RI 70 (stable), otw nonobs dzs, EF 65%;  d. 07/2016 Lexiscan MV: EF 56%, no ischemia/infarct.  [place] [facility] GERD (gastroesophageal reflux disease)   . HTN (hypertension)   . Hyperlipidemia   . Ischemic cardiomyopathy    a. EF 40-45% in 08/2009 at time of MI. b. Improved to 55-60% by June 2011; c. 03/2016 Echo: EF 55-60%, no rwma, Gr1 DD, mild LVH.  . Osteoarthritis   . Osteopenia   . PONV (postoperative nausea and vomiting)   . Postoperative groin pseudoaneurysm ([place])    a. 04/2010 following diagnostic cardiac cath, s/p compression.  . Rib fractures    left sided second and sixth rib   . Shingles   . TIA (transient ischemic attack)    a. 03/2016 MRI/MRA negative/unremarkable.  . Ventricular fibrillation ([place])    a. 08/2009: due to anterior MI.    Tobacco Use: Social History   Tobacco Use  Smoking Status Former Smoker  . Packs/day: 2.00  . Years: 20.00  . Pack years: 40.00  . Types: Cigarettes  . Last attempt to quit: 04/06/1984  . Years since  quitting: 33.5  Smokeless Tobacco Never Used    Labs: Recent Review Flowsheet Data    Labs for ITP Cardiac and Pulmonary Rehab Latest Ref Rng & Units 05/21/2010 11/20/2010 11/15/2012 03/16/2016 03/17/2016   Cholestrol 0 - 200 mg/dL 157 176 - - 158   LDLCALC 0 - 99 mg/dL 58 54 - - 62   LDLDIRECT mg/dL - - - - -   HDL >40 mg/dL 92.20 111.60 - - 82   Trlycerides <150 mg/dL 36.0 50.0 - - 72   Hemoglobin A1c 4.8 - 5.6 % - - - - 5.8(H)   TCO2 0 - 100 mmol/L - - 28 30 -      Capillary Blood Glucose: Lab Results  Component Value Date   GLUCAP 99 03/16/2016     Pulmonary Assessment Scores: Pulmonary Assessment Scores    Row Name 08/10/17 1825 08/12/17 0758       ADL UCSD   ADL Phase  Entry  Entry    SOB Score  total  65  -      CAT Score   CAT Score  20 Entry  -      mMRC Score   mMRC Score  -  2       Pulmonary Function Assessment: Pulmonary Function Assessment - 07/30/17 1147      Breath   Bilateral Breath Sounds  Clear    Shortness of Breath  Yes;Limiting activity       Exercise Target Goals:    Exercise Program Goal: Individual exercise prescription set using results from initial 6 min walk test and THRR while considering  patient's activity barriers and safety.   Exercise Prescription Goal: Initial exercise prescription builds to 30-45 minutes a day of aerobic activity, 2-3 days per week.  Home exercise guidelines will be given to patient during program as part of exercise prescription that the participant will acknowledge.  Activity Barriers & Risk Stratification: Activity Barriers & Cardiac Risk Stratification - 07/30/17 1028      Activity Barriers & Cardiac Risk Stratification   Activity Barriers  Shortness of Breath;Balance Concerns    Cardiac Risk Stratification  Moderate       6 Minute Walk: 6 Minute Walk    Row Name 08/12/17 0737         6 Minute Walk   Phase  Initial     Distance  1000 feet     Walk Time  6 minutes     # of Rest Breaks  0     MPH  1.89     METS  2.45     RPE  13     Perceived Dyspnea   2     Symptoms  No     Resting HR  59 bpm     Resting BP  120/80     Resting Oxygen Saturation   95 %     Exercise Oxygen Saturation  during 6 min walk  90 %     Max Ex. HR  90 bpm     Max Ex. BP  144/80     2 Minute Post BP  128/80       Interval HR   1 Minute HR  68     2 Minute HR  83     3 Minute HR  87     4 Minute HR  90     5 Minute HR  90     6 Minute HR  70  2 Minute Post HR  63     Interval Heart Rate?  Yes       Interval Oxygen   Interval Oxygen?  Yes     Baseline Oxygen Saturation %  95 %     1 Minute Oxygen Saturation %  94 %     1 Minute Liters of Oxygen  0 L     2 Minute Oxygen Saturation %  95 %     2 Minute Liters  of Oxygen  0 L     3 Minute Oxygen Saturation %  90 %     3 Minute Liters of Oxygen  0 L     4 Minute Oxygen Saturation %  90 %     4 Minute Liters of Oxygen  0 L     5 Minute Oxygen Saturation %  90 %     5 Minute Liters of Oxygen  0 L     6 Minute Oxygen Saturation %  92 %     6 Minute Liters of Oxygen  0 L     2 Minute Post Oxygen Saturation %  95 %     2 Minute Post Liters of Oxygen  0 L        Oxygen Initial Assessment: Oxygen Initial Assessment - 08/12/17 0737      Initial 6 min Walk   Oxygen Used  None      Program Oxygen Prescription   Program Oxygen Prescription  None       Oxygen Re-Evaluation: Oxygen Re-Evaluation    Row Name 09/07/17 0749 10/01/17 1048           Program Oxygen Prescription   Program Oxygen Prescription  None  None        Home Oxygen   Home Oxygen Device  None  None      Sleep Oxygen Prescription  None  None      Home Exercise Oxygen Prescription  None  None      Home at Rest Exercise Oxygen Prescription  None  None         Oxygen Discharge (Final Oxygen Re-Evaluation): Oxygen Re-Evaluation - 10/01/17 1048      Program Oxygen Prescription   Program Oxygen Prescription  None      Home Oxygen   Home Oxygen Device  None    Sleep Oxygen Prescription  None    Home Exercise Oxygen Prescription  None    Home at Rest Exercise Oxygen Prescription  None       Initial Exercise Prescription: Initial Exercise Prescription - 08/12/17 0700      Date of Initial Exercise RX and Referring Provider   Date  08/12/17    Referring Provider  Dr. Melvyn Novas      Recumbant Bike   Level  1    Watts  10    Minutes  17      NuStep   Level  2    SPM  80    Minutes  17    METs  1.5      Track   Laps  5    Minutes  17      Prescription Details   Frequency (times per week)  2    Duration  Progress to 45 minutes of aerobic exercise without signs/symptoms of physical distress      Intensity   THRR 40-80% of Max Heartrate  57-114    Ratings of  Perceived Exertion  11-13  Perceived Dyspnea  0-4      Progression   Progression  Continue progressive overload as per policy without signs/symptoms or physical distress.      Resistance Training   Training Prescription  Yes    Weight  orange bands    Reps  10-15       Perform Capillary Blood Glucose checks as needed.  Exercise Prescription Changes: Exercise Prescription Changes    Row Name 08/17/17 1200 09/14/17 1300 09/23/17 1115 09/23/17 1518       Response to Exercise   Blood Pressure (Admit)  106/70  120/74  118/60  -    Blood Pressure (Exercise)  140/80  140/70  120/70  -    Blood Pressure (Exit)  110/60  120/70  108/60  -    Heart Rate (Admit)  65 bpm  61 bpm  57 bpm  -    Heart Rate (Exercise)  94 bpm  96 bpm  86 bpm  -    Heart Rate (Exit)  99 bpm  62 bpm  66 bpm  -    Oxygen Saturation (Admit)  95 %  96 %  93 %  -    Oxygen Saturation (Exercise)  91 %  93 %  95 %  -    Oxygen Saturation (Exit)  98 %  93 %  93 %  -    Rating of Perceived Exertion (Exercise)  _0 -    Perceived Dyspnea (Exercise)  _1 -    Duration  Progress to 45 minutes of aerobic exercise without signs/symptoms of physical distress  Progress to 45 minutes of aerobic exercise without signs/symptoms of physical distress  Progress to 45 minutes of aerobic exercise without signs/symptoms of physical distress  -    Intensity  THRR unchanged  THRR unchanged  THRR unchanged  -      Progression   Progression  Continue to progress workloads to maintain intensity without signs/symptoms of physical distress.  Continue to progress workloads to maintain intensity without signs/symptoms of physical distress.  Continue to progress workloads to maintain intensity without signs/symptoms of physical distress.  -      Resistance Training   Training Prescription  Yes  Yes  Yes  -    Weight  orange bands  orange bands  orange bands  -    Reps  10-15  10-15  10-15  -    Time  -  10 Minutes  10 Minutes  -       Interval Training   Interval Training  -  -  No  -      Recumbant Bike   Level  2  3  -  -    Watts  10  10  -  -    Minutes  17  17  -  -      NuStep   Level  _2 -    SPM  80  80  80  -    Minutes  _3 -    METs  2.5  2.6  2.7  -      Track   Laps  _4 -    Minutes  _5 -      Home Exercise Plan   Plans to [facility]    Frequency  -  -  -  Add 2 additional days to program exercise sessions.       Exercise Comments: Exercise Comments    Row Name 09/24/17 1518           Exercise Comments  Home exercise completed          Exercise Goals and Review: Exercise Goals    Row Name 07/30/17 1034 07/30/17 1133           Exercise Goals   Increase Physical Activity  Yes  -      Intervention  Provide advice, education, support and counseling about physical activity/exercise needs.;Develop an individualized exercise prescription for aerobic and resistive training based on initial evaluation findings, risk stratification, comorbidities and participant's personal goals.  -      Expected Outcomes  Short Term: Attend rehab on a regular basis to increase amount of physical activity.;Long Term: Add in home exercise to make exercise part of routine and to increase amount of physical activity.;Long Term: Exercising regularly at least 3-5 days a week.  Short Term: Attend rehab on a regular basis to increase amount of physical activity.;Long Term: Add in home exercise to make exercise part of routine and to increase amount of physical activity.;Long Term: Exercising regularly at least 3-5 days a week.      Increase Strength and Stamina  Yes  -      Intervention  Provide advice, education, support and counseling about physical activity/exercise needs.;Develop an individualized exercise prescription for aerobic and resistive training based on initial evaluation findings, risk stratification, comorbidities and  participant's personal goals.  -      Expected Outcomes  Short Term: Increase workloads from initial exercise prescription for resistance, speed, and METs.;Short Term: Perform resistance training exercises routinely during rehab and add in resistance training at home;Long Term: Improve cardiorespiratory fitness, muscular endurance and strength as measured by increased METs and functional capacity (6MWT)  -      Able to understand and use rate of perceived exertion (RPE) scale  Yes  -      Intervention  Provide education and explanation on how to use RPE scale  -      Expected Outcomes  Short Term: Able to use RPE daily in rehab to express subjective intensity level;Long Term:  Able to use RPE to guide intensity level when exercising independently  -      Able to understand and use Dyspnea scale  Yes  -      Intervention  Provide education and explanation on how to use Dyspnea scale  -      Expected Outcomes  Short Term: Able to use Dyspnea scale daily in rehab to express subjective sense of shortness of breath during exertion;Long Term: Able to use Dyspnea scale to guide intensity level when exercising independently  -      Knowledge and understanding of Target Heart Rate Range (THRR)  Yes  -      Intervention  Provide education and explanation of THRR including how the numbers were predicted and where they are located for reference  -      Expected Outcomes  Short Term: Able to state/look up THRR;Long Term: Able to use THRR to govern intensity when exercising independently;Short Term: Able to use daily as guideline for intensity in rehab  -      Understanding of Exercise Prescription  Yes  -      Intervention  Provide  education, explanation, and written materials on patient's individual exercise prescription  -      Expected Outcomes  Short Term: Able to explain program exercise prescription;Long Term: Able to explain home exercise prescription to exercise independently  -         Exercise Goals  Re-Evaluation : Exercise Goals Re-Evaluation    Row Name 09/07/17 0749 10/01/17 1048           Exercise Goal Re-Evaluation   Exercise Goals Review  Increase Strength and Stamina;Able to understand and use Dyspnea scale;Increase Physical Activity;Able to understand and use rate of perceived exertion (RPE) scale;Knowledge and understanding of Target Heart Rate Range (THRR);Understanding of Exercise Prescription  Increase Strength and Stamina;Able to understand and use Dyspnea scale;Increase Physical Activity;Able to understand and use rate of perceived exertion (RPE) scale;Knowledge and understanding of Target Heart Rate Range (THRR);Understanding of Exercise Prescription      Comments  Patient has only attended 5 rehab sessions. She is progressing well, however. She is able to walk up to 16 laps (200 ft each) in 15 minutes. Patient is open to working at higher workloads. Will cont. to monitor and motivate. I will conduct home exercise prescription so the patient knows what exactly to do at home.   Patient is progressing well. She is able to walk up to 17 laps (200 ft each) in 15 minutes. Patient is open to working at higher workloads. Will cont. to monitor and motivate. Home exercise program has been conducted.        Expected Outcomes  Through exercise at rehab and at home, the patient will decrease shortness of breath with daily activities and feel confident in carrying out an exercise regime at home.   Through exercise at rehab and at home, the patient will decrease shortness of breath with daily activities and feel confident in carrying out an exercise regime at home.          Discharge Exercise Prescription (Final Exercise Prescription Changes): Exercise Prescription Changes - 09/23/17 1518      Home Exercise Plan   Plans to continue exercise at  [facility] (comment)    Frequency  Add 2 additional days to program exercise sessions.       Nutrition:  Target Goals: Understanding of  nutrition guidelines, daily intake of sodium <1575m, cholesterol <2039m calories 30% from fat and 7% or less from saturated fats, daily to have 5 or more servings of fruits and vegetables.  Biometrics:    Nutrition Therapy Plan and Nutrition Goals: Nutrition Therapy & Goals - 09/02/17 1234      Nutrition Therapy   Diet  Heart Healthy      Personal Nutrition Goals   Nutrition Goal  Describe the benefit of including fruits, vegetables, whole grains, and low-fat dairy products in a healthy meal plan.      Intervention Plan   Intervention  Prescribe, educate and counsel regarding individualized specific dietary modifications aiming towards targeted core components such as weight, hypertension, lipid management, diabetes, heart failure and other comorbidities.    Expected Outcomes  Short Term Goal: Understand basic principles of dietary content, such as calories, fat, sodium, cholesterol and nutrients.;Long Term Goal: Adherence to prescribed nutrition plan.       Nutrition Assessments: Nutrition Assessments - 08/11/17 1514      Rate Your Plate Scores   Pre Score  55       Nutrition Goals Re-Evaluation:   Nutrition Goals Discharge (Final Nutrition Goals Re-Evaluation):   Psychosocial:  Target Goals: Acknowledge presence or absence of significant depression and/or stress, maximize coping skills, provide positive support system. Participant is able to verbalize types and ability to use techniques and skills needed for reducing stress and depression.  Initial Review & Psychosocial Screening: Initial Psych Review & Screening - 07/30/17 1154      Family Dynamics   Comments  Pt has help from spouse but he has limited mobility and uses a walker.  Pt children [place] of [place].  Pt freinds are also elderly.  Pt feesl she manages the best you can.  (Pended)        Quality of Life Scores:  Scores of 19 and below usually indicate a poorer quality of life in these areas.  A difference  of  2-3 points is a clinically meaningful difference.  A difference of 2-3 points in the total score of the Quality of Life Index has been associated with significant improvement in overall quality of life, self-image, physical symptoms, and general health in studies assessing change in quality of life.   PHQ-9: Recent Review Flowsheet Data    Depression screen [facility] 2/9 07/30/2017   Decreased Interest 1   Down, Depressed, Hopeless 1   PHQ - 2 Score 2   Altered sleeping 1   Tired, decreased energy 1   Change in appetite 0   Feeling bad or failure about yourself  1   Trouble concentrating 0   Moving slowly or fidgety/restless 0   Suicidal thoughts 0   PHQ-9 Score 5   Difficult doing work/chores Not difficult at all     Interpretation of Total Score  Total Score Depression Severity:  1-4 = Minimal depression, 5-9 = Mild depression, 10-14 = Moderate depression, 15-19 = Moderately severe depression, 20-27 = Severe depression   Psychosocial Evaluation and Intervention: Psychosocial Evaluation - 09/06/17 1403      Psychosocial Evaluation & Interventions   Interventions  Stress management education;Relaxation education;Encouraged to exercise with the program and follow exercise prescription    Comments  Pt reports every day stress but does not feel this is more than she can manage.    Expected Outcomes  positive and healthy coping skills and learned techniques for stress managment    Continue Psychosocial Services   No Follow up required       Psychosocial Re-Evaluation: Psychosocial Re-Evaluation    [place] Name 09/06/17 1410 09/06/17 1411 10/04/17 1348         Psychosocial Re-Evaluation   Current issues with  -  None Identified  None Identified     Comments  -  Pt does not identify any reportable pyschosocial issues.  -     Expected Outcomes  -  Pt demonstrate positive and healthy coping skills.  -     Interventions  Relaxation education;Stress management education;Encouraged to  attend Pulmonary Rehabilitation for the exercise  -  Encouraged to attend Pulmonary Rehabilitation for the exercise     Continue Psychosocial Services   Follow up required by counselor  -  No Follow up required        Psychosocial Discharge (Final Psychosocial Re-Evaluation): Psychosocial Re-Evaluation - 10/04/17 1348      Psychosocial Re-Evaluation   Current issues with  None Identified    Interventions  Encouraged to attend Pulmonary Rehabilitation for the exercise    Continue Psychosocial Services   No Follow up required        Education: Education Goals: Education classes will be provided on a weekly basis,  covering required topics. Participant will state understanding/return demonstration of topics presented.  Learning Barriers/Preferences: Learning Barriers/Preferences - 07/30/17 1026      Learning Barriers/Preferences   Learning Barriers  Sight;Hearing wears glasses  wears hearing aids    Learning Preferences  Written Material;Pictoral;Group Instruction       Education Topics: How Lungs Work and Diseases: - Discuss the anatomy of the lungs and diseases that can affect the lungs, such as COPD.   Exercise: -Discuss the importance of exercise, FITT principles of exercise, normal and abnormal responses to exercise, and how to exercise safely.   Environmental Irritants: -Discuss types of environmental irritants and how to limit exposure to environmental irritants.   Meds/Inhalers and oxygen: - Discuss respiratory medications, definition of an inhaler and oxygen, and the proper way to use an inhaler and oxygen.   Energy Saving Techniques: - Discuss methods to conserve energy and decrease shortness of breath when performing activities of daily living.    Bronchial Hygiene / Breathing Techniques: - Discuss breathing mechanics, pursed-lip breathing technique,  proper posture, effective ways to clear airways, and other functional breathing techniques   Cleaning  Equipment: - Provides group verbal and written instruction about the health risks of elevated stress, cause of high stress, and healthy ways to reduce stress.   Nutrition I: Fats: - Discuss the types of cholesterol, what cholesterol does to the body, and how cholesterol levels can be controlled.   Nutrition II: Labels: -Discuss the different components of food labels and how to read food labels.   Respiratory Infections: - Discuss the signs and symptoms of respiratory infections, ways to prevent respiratory infections, and the importance of seeking medical treatment when having a respiratory infection.   Stress I: Signs and Symptoms: - Discuss the causes of stress, how stress may lead to anxiety and depression, and ways to limit stress.   Stress II: Relaxation: -Discuss relaxation techniques to limit stress.   Oxygen for Home/Travel: - Discuss how to prepare for travel when on oxygen and proper ways to transport and store oxygen to ensure safety.   Knowledge Questionnaire Score: Knowledge Questionnaire Score - 08/10/17 1824      Knowledge Questionnaire Score   Pre Score  15/18       Core Components/Risk Factors/Patient Goals at Admission: Personal Goals and Risk Factors at Admission - 07/30/17 1019      Core Components/Risk Factors/Patient Goals on Admission    Weight Management  Weight Loss    Improve shortness of breath with ADL's  Yes    Intervention  Provide education, individualized exercise plan and daily activity instruction to help decrease symptoms of SOB with activities of daily living.    Expected Outcomes  Short Term: Improve cardiorespiratory fitness to achieve a reduction of symptoms when performing ADLs    Hypertension  Yes    Intervention  Provide education on lifestyle modifcations including regular physical activity/exercise, weight management, moderate sodium restriction and increased consumption of fresh fruit, vegetables, and low fat dairy, alcohol  moderation, and smoking cessation.;Monitor prescription use compliance.    Expected Outcomes  Short Term: Continued assessment and intervention until BP is < 140/36m HG in hypertensive participants. < 130/827mHG in hypertensive participants with diabetes, heart failure or chronic kidney disease.;Long Term: Maintenance of blood pressure at goal levels.    Lipids  Yes    Intervention  Provide education and support for participant on nutrition & aerobic/resistive exercise along with prescribed medications to achieve LDL <7042mHDL >70m65m  Expected Outcomes  Short Term: Participant states understanding of desired cholesterol values and is compliant with medications prescribed. Participant is following exercise prescription and nutrition guidelines.;Long Term: Cholesterol controlled with medications as prescribed, with individualized exercise RX and with personalized nutrition plan. Value goals: LDL < '70mg'$ , HDL > 40 mg.    Stress  Yes    Intervention  Offer individual and/or small group education and counseling on adjustment to heart disease, stress management and health-related lifestyle change. Teach and support self-help strategies.;Refer participants experiencing significant psychosocial distress to appropriate mental health specialists for further evaluation and treatment. When possible, include family members and significant others in education/counseling sessions.    Expected Outcomes  Short Term: Participant demonstrates changes in health-related behavior, relaxation and other stress management skills, ability to obtain effective social support, and compliance with psychotropic medications if prescribed.;Long Term: Emotional wellbeing is indicated by absence of clinically significant psychosocial distress or social isolation.    Personal Goal Other  Yes    Personal Goal  Be able travel with short trips with ease and no shortness of breath    Expected Outcomes  Pt will be able to travel to [place]  for her grandson high school graduation in [place].   Pt will be able to travel to [place] for vacation in June.       Core Components/Risk Factors/Patient Goals Review:  Goals and Risk Factor Review    Row Name 09/06/17 1348 10/04/17 1342           Core Components/Risk Factors/Patient Goals Review   Personal Goals Review  Stress;Hypertension;Lipids;Improve shortness of breath with ADL's;Weight Management/Obesity  Weight Management/Obesity;Lipids;Hypertension;Stress      Review  Pt is fairly new to pulmonary rehab.  However pt is making progress toward her goals.  Pt weight remains stable with no weight gain noted.  Pt met with RD on 5/31 for discussion of strategies and eating plan.  Pt attending nutrition education class on 5/16.Pt continues to desire to lose weight.  Pt BP remain well within normal limits with the expected increase with activity and the return to pt baseline post exercise - 120's -130's/60-70's.  Pt is compliant with her medications.  pt reports she is using strategies to help with purse lip breathing such as PLB and rest breaks.   Pt is planning to attend her grandson high school graduation in [place].  Will check back in with pt to see how she managed for the trip.  Pt is planning a vacation later this month to [place].  Will check back in with pt for progress toward the ability to travel to the beach for vacation.  -      Expected Outcomes  See "admission expected outcomes"  Is presently on a trip to [place] and I am anxious to see if she was able to enjoy her time and be more active than before she started the program.  Her hypertension , lipids, and stress are controlled.  She has not lost any weight since starting the program.         Core Components/Risk Factors/Patient Goals at Discharge (Final Review):  Goals and Risk Factor Review - 10/04/17 1342      Core Components/Risk Factors/Patient Goals Review   Personal Goals Review  Weight  Management/Obesity;Lipids;Hypertension;Stress    Expected Outcomes  Is presently on a trip to [place] and I am anxious to see if she was able to enjoy her time and be more active than before she  started the program.  Her hypertension , lipids, and stress are controlled.  She has not lost any weight since starting the program.       ITP Comments: ITP Comments    Row Name 07/30/17 1011 09/08/17 1032         ITP Comments  Dr. Jennet Maduro, Medical Director  Dr. Jennet Maduro, Medical Director         Comments: ITP REVIEW Pt is making expected progress toward pulmonary rehab goals after completing 12 sessions. Recommend continued exercise, life style modification, education, and utilization of breathing techniques to increase stamina and strength and decrease shortness of breath with exertion.

## 2017-10-05 NOTE — Progress Notes (Signed)
Daily Session Note  Patient Details  Name: [name] MRN: [id number] Date of Birth: Oct 17, 1940 Referring Provider:     Pulmonary Rehab Walk Test from 08/10/2017 in [place]  Referring Provider  Dr. Melvyn Novas      Encounter Date: 10/05/2017  Check In: Session Check In - 10/05/17 1030      Check-In   Location  [facility]    Staff Present  Rosebud Poles, RN, BSN;[name] Carlton, RN, [facility], MS, ACSM RCEP, Exercise Physiologist;Lisa Ysidro Evert, Felipe Drone, RN, [facility]    Supervising physician immediately available to respond to emergencies  [facility] immediately available    Physician(s)  Dr. Bonner Puna    Medication changes reported      No    Fall or balance concerns reported     No    Tobacco Cessation  No Change    Warm-up and Cool-down  Performed as group-led instruction    Resistance Training Performed  Yes    VAD Patient?  No    PAD/SET Patient?  No      Pain Assessment   Currently in Pain?  No/denies    Multiple Pain Sites  No       Capillary Blood Glucose: No results found for this or any previous visit (from the past 24 hour(s)).    Social History   Tobacco Use  Smoking Status Former Smoker  . Packs/day: 2.00  . Years: 20.00  . Pack years: 40.00  . Types: Cigarettes  . Last attempt to quit: 04/06/1984  . Years since quitting: 33.5  Smokeless Tobacco Never Used    Goals Met:  Exercise tolerated well Strength training completed today  Goals Unmet:  Not Applicable  Comments: Service time is from 1030 to 1215    Dr. Rush Farmer is Medical Director for Pulmonary Rehab at [facility].

## 2017-10-05 NOTE — Progress Notes (Signed)
[name] 77 y.o. female  6860 day Psychosocial Note  Patient psychosocial assessment reveals no barriers to participation in Pulmonary Rehab. Patient does feel she is making progress toward Pulmonary Rehab goals. Patient reports her health and activity level has improved in the past 30 days as evidenced by patient's report of increased ability to exercise and go to the beach this past week with her family. Patient states family/friends have noticed changes in her activity or mood. Patient reports feeling positive about current and projected progression in Pulmonary Rehab. After reviewing the patient's treatment plan, the patient is making progress toward Pulmonary Rehab goals. Patient's rate of progress toward rehab goals is good. Plan of action to help patient continue to work towards rehab goals include increasing workloads on equipment as tolerated. Will continue to monitor and evaluate progress toward psychosocial goal(s).  Her goal was to be able to go to the beach and enjoy being more active than she has been in the past.  Goal(s) in progress: Increase workloads as appropriate and tolerated Improved coping skills Help patient work toward returning to meaningful activities that improve patient's QOL and are attainable with patient's lung disease

## 2017-10-08 NOTE — Progress Notes (Signed)
[name] presents to pulmonary rehab for her bi-weekly exercise session. I have completed her thirty day face to face review and determined that [name] is on track for meeting their pulmonary rehab goals. There are not barriers identified that will prevent them from continuing their exercise in pulmonary rehab as prescribed.   Alyson ReedyWesam G. Yacoub, M.D. [facility]. Pager: [phone]. After hours pager: [phone].

## 2017-10-12 ENCOUNTER — Encounter (HOSPITAL_COMMUNITY)
Admission: RE | Admit: 2017-10-12 | Discharge: 2017-10-12 | Disposition: A | Discharge status: Home / Self Care | Payer: Medicare Other | Source: Ambulatory Visit | Attending: Internal Medicine | Admitting: Internal Medicine

## 2017-10-12 VITALS — Wt 167.1 lb

## 2017-10-12 DIAGNOSIS — J449 Chronic obstructive pulmonary disease, unspecified: Secondary | ICD-10-CM

## 2017-10-12 NOTE — Progress Notes (Signed)
Daily Session Note  Patient Details  Name: [name] MRN: [id number] Date of Birth: 18-Dec-1940 Referring Provider:     Pulmonary Rehab Walk Test from 08/10/2017 in [place]  Referring Provider  Dr. Melvyn Novas      Encounter Date: 10/12/2017  Check In: Session Check In - 10/12/17 1030      Check-In   Location  [facility]    Staff Present  Rosebud Poles, RN, BSN;[name] Wilber Oliphant, RN, BSN;Lisa Ysidro Evert, Felipe Drone, RN, [facility]    Supervising physician immediately available to respond to emergencies  [facility] immediately available    Physician(s)  Dr. Verlon Au    Medication changes reported      No    Fall or balance concerns reported     No    Tobacco Cessation  No Change    Warm-up and Cool-down  Performed as group-led instruction    Resistance Training Performed  Yes    VAD Patient?  No    PAD/SET Patient?  No      Pain Assessment   Currently in Pain?  No/denies    Multiple Pain Sites  No       Capillary Blood Glucose: No results found for this or any previous visit (from the past 24 hour(s)).  Exercise Prescription Changes - 10/12/17 1200      Response to Exercise   Blood Pressure (Admit)  122/60    Blood Pressure (Exercise)  138/70    Blood Pressure (Exit)  105/58    Heart Rate (Admit)  62 bpm    Heart Rate (Exercise)  73 bpm    Heart Rate (Exit)  64 bpm    Oxygen Saturation (Admit)  94 %    Oxygen Saturation (Exercise)  91 %    Oxygen Saturation (Exit)  94 %    Rating of Perceived Exertion (Exercise)  15    Perceived Dyspnea (Exercise)  3    Duration  Progress to 45 minutes of aerobic exercise without signs/symptoms of physical distress    Intensity  THRR unchanged      Progression   Progression  Continue to progress workloads to maintain intensity without signs/symptoms of physical distress.      Resistance Training   Training Prescription  Yes    Weight  orange bands    Reps  10-15    Time  10  Minutes      Interval Training   Interval Training  No      Recumbant Bike   Level  3    Watts  10    Minutes  17      NuStep   Level  4    SPM  80    Minutes  17    METs  3.4      Track   Laps  17    Minutes  17       Social History   Tobacco Use  Smoking Status Former Smoker  . Packs/day: 2.00  . Years: 20.00  . Pack years: 40.00  . Types: Cigarettes  . Last attempt to quit: 04/06/1984  . Years since quitting: 33.5  Smokeless Tobacco Never Used    Goals Met:  Exercise tolerated well Strength training completed today  Goals Unmet:  Not Applicable  Comments: Service time is from 1030 to 1210.    Dr. Rush Farmer is Medical Director for Pulmonary Rehab at [facility].

## 2017-10-14 ENCOUNTER — Encounter (HOSPITAL_COMMUNITY)
Admission: RE | Admit: 2017-10-14 | Discharge: 2017-10-14 | Disposition: A | Discharge status: Home / Self Care | Payer: Medicare Other | Source: Ambulatory Visit | Attending: Internal Medicine | Admitting: Internal Medicine

## 2017-10-14 VITALS — Wt 168.2 lb

## 2017-10-14 DIAGNOSIS — J449 Chronic obstructive pulmonary disease, unspecified: Secondary | ICD-10-CM | POA: Diagnosis not present

## 2017-10-14 NOTE — Progress Notes (Signed)
Daily Session Note  Patient Details  Name: [name] MRN: [id number] Date of Birth: 04-Apr-1941 Referring Provider:     Pulmonary Rehab Walk Test from 08/10/2017 in [place]  Referring Provider  Dr. Melvyn Novas      Encounter Date: 10/14/2017  Check In: Session Check In - 10/14/17 1030      Check-In   Location  [facility]    Staff Present  Rosebud Poles, RN, BSN;[name] Wilber Oliphant, RN, BSN;Lisa Ysidro Evert, Felipe Drone, RN, [facility]    Supervising physician immediately available to respond to emergencies  [facility] immediately available    Physician(s)  Dr. Broadus John    Medication changes reported      No    Fall or balance concerns reported     No    Tobacco Cessation  No Change    Warm-up and Cool-down  Performed as group-led instruction    Resistance Training Performed  Yes    VAD Patient?  No    PAD/SET Patient?  No      Pain Assessment   Currently in Pain?  No/denies    Multiple Pain Sites  No       Capillary Blood Glucose: No results found for this or any previous visit (from the past 24 hour(s)).    Social History   Tobacco Use  Smoking Status Former Smoker  . Packs/day: 2.00  . Years: 20.00  . Pack years: 40.00  . Types: Cigarettes  . Last attempt to quit: 04/06/1984  . Years since quitting: 33.5  Smokeless Tobacco Never Used    Goals Met:  Exercise tolerated well Strength training completed today  Goals Unmet:  Not Applicable  Comments: Service time is from 1030 to 1225    Dr. Rush Farmer is Medical Director for Pulmonary Rehab at [facility].

## 2017-10-19 ENCOUNTER — Encounter (HOSPITAL_COMMUNITY)
Admission: RE | Admit: 2017-10-19 | Discharge: 2017-10-19 | Disposition: A | Discharge status: Home / Self Care | Payer: Medicare Other | Source: Ambulatory Visit | Attending: Internal Medicine | Admitting: Internal Medicine

## 2017-10-19 VITALS — Wt 168.2 lb

## 2017-10-19 DIAGNOSIS — J449 Chronic obstructive pulmonary disease, unspecified: Secondary | ICD-10-CM

## 2017-10-19 NOTE — Progress Notes (Signed)
Daily Session Note  Patient Details  Name: [name] MRN: [id number] Date of Birth: 1941-01-10 Referring Provider:     Pulmonary Rehab Walk Test from 08/10/2017 in [place]  Referring Provider  Dr. Melvyn Novas      Encounter Date: 10/19/2017  Check In: Session Check In - 10/19/17 1030      Check-In   Location  [facility]    Staff Present  Rosebud Poles, RN, BSN;[name] Carlton, RN, [facility], MS, ACSM RCEP, Exercise Physiologist;Lisa Ysidro Evert, Felipe Drone, RN, [facility]    Supervising physician immediately available to respond to emergencies  [facility] immediately available    Physician(s)  Dr. Broadus John    Medication changes reported      No    Fall or balance concerns reported     No    Tobacco Cessation  No Change    Warm-up and Cool-down  Performed as group-led instruction    Resistance Training Performed  Yes    VAD Patient?  No    PAD/SET Patient?  No      Pain Assessment   Currently in Pain?  No/denies    Multiple Pain Sites  No       Capillary Blood Glucose: No results found for this or any previous visit (from the past 24 hour(s)).    Social History   Tobacco Use  Smoking Status Former Smoker  . Packs/day: 2.00  . Years: 20.00  . Pack years: 40.00  . Types: Cigarettes  . Last attempt to quit: 04/06/1984  . Years since quitting: 33.5  Smokeless Tobacco Never Used    Goals Met:  Exercise tolerated well Strength training completed today  Goals Unmet:  Not Applicable  Comments: Service time is from 1030 to 1205    Dr. Rush Farmer is Medical Director for Pulmonary Rehab at [facility].

## 2017-10-21 ENCOUNTER — Encounter (HOSPITAL_COMMUNITY): Payer: Medicare Other

## 2017-10-25 NOTE — Progress Notes (Signed)
Pulmonary Individual Treatment Plan  Patient Details  Name: [name] MRN: [id number] Date of Birth: 10-Oct-1940 Referring Provider:     Pulmonary Rehab Walk Test from 08/10/2017 in [facility]  Referring Provider  Dr. Melvyn Novas      Initial Encounter Date:    Pulmonary Rehab Walk Test from 08/10/2017 in [facility]  Date  08/12/17      Visit Diagnosis: Stage 2 moderate COPD by GOLD classification ([place])  Patient's Home Medications on Admission:   Current Outpatient Medications:  .  aspirin EC 81 MG tablet, Take 1 tablet (81 mg total) daily by mouth., Disp: 90 tablet, Rfl: 3 .  calcium carbonate (OS-CAL) 600 MG TABS tablet, Take 600 mg by mouth 2 (two) times daily with a meal. , Disp: , Rfl:  .  Cholecalciferol (VITAMIN D3) 1000 units CAPS, Take 1,000 Units by mouth daily., Disp: , Rfl:  .  Glycopyrrolate-Formoterol (BEVESPI AEROSPHERE) 9-4.8 MCG/ACT AERO, Inhale 2 puffs into the lungs 2 (two) times daily., Disp: 1 Inhaler, Rfl: 11 .  loratadine (CLARITIN) 10 MG tablet, Take 10 mg by mouth daily as needed. , Disp: , Rfl:  .  losartan (COZAAR) 50 MG tablet, TAKE 1 TABLET (50 MG TOTAL) DAILY BY MOUTH., Disp: 90 tablet, Rfl: 1 .  metoprolol succinate (TOPROL-XL) 25 MG 24 hr tablet, Take 1 tablet by mouth daily., Disp: , Rfl:  .  nitroGLYCERIN (NITROSTAT) 0.4 MG SL tablet, Place 1 tablet (0.4 mg total) under the tongue every 5 (five) minutes as needed. For chest pain, Disp: 25 tablet, Rfl: 1 .  pantoprazole (PROTONIX) 40 MG tablet, Take 1 tablet by mouth daily., Disp: , Rfl:  .  rosuvastatin (CRESTOR) 20 MG tablet, Take 1 tablet (20 mg total) by mouth at bedtime., Disp: 90 tablet, Rfl: 3 .  traMADol (ULTRAM) 50 MG tablet, Take 50 mg by mouth every 6 (six) hours as needed., Disp: , Rfl:  .  Zoledronic Acid (RECLAST IV), Inject into the vein. Once Yearly, Disp: , Rfl:   Past Medical History: Past Medical History:  Diagnosis Date  .  Compression fracture    T9  . COPD (chronic obstructive pulmonary disease) ([place])   . Coronary artery disease 08/14/2009   a. 08/2009: Ant MI c/b v-fib arrest, s/p PTCA/BMS to LAD. (STENT Placement); b. 2013 Myoview: no ischemia; c. 11/2012 Cath: patent mLAD stent, RI 70 (stable), otw nonobs dzs, EF 65%;  d. 07/2016 Lexiscan MV: EF 56%, no ischemia/infarct.  [place] GERD (gastroesophageal reflux disease)   . HTN (hypertension)   . Hyperlipidemia   . Ischemic cardiomyopathy    a. EF 40-45% in 08/2009 at time of MI. b. Improved to 55-60% by June 2011; c. 03/2016 Echo: EF 55-60%, no rwma, Gr1 DD, mild LVH.  . Osteoarthritis   . Osteopenia   . PONV (postoperative nausea and vomiting)   . Postoperative groin pseudoaneurysm ([place])    a. 04/2010 following diagnostic cardiac cath, s/p compression.  . Rib fractures    left sided second and sixth rib   . Shingles   . TIA (transient ischemic attack)    a. 03/2016 MRI/MRA negative/unremarkable.  . Ventricular fibrillation ([place])    a. 08/2009: due to anterior MI.    Tobacco Use: Social History   Tobacco Use  Smoking Status Former Smoker  . Packs/day: 2.00  . Years: 20.00  . Pack years: 40.00  . Types: Cigarettes  . Last attempt to quit: 04/06/1984  .  Years since quitting: 33.5  Smokeless Tobacco Never Used    Labs: Recent Chemical engineer    Labs for ITP Cardiac and Pulmonary Rehab Latest Ref Rng & Units 05/21/2010 11/20/2010 11/15/2012 03/16/2016 03/17/2016   Cholestrol 0 - 200 mg/dL 157 176 - - 158   LDLCALC 0 - 99 mg/dL 58 54 - - 62   LDLDIRECT mg/dL - - - - -   HDL >40 mg/dL 92.20 111.60 - - 82   Trlycerides <150 mg/dL 36.0 50.0 - - 72   Hemoglobin A1c 4.8 - 5.6 % - - - - 5.8(H)   TCO2 0 - 100 mmol/L - - 28 30 -      Capillary Blood Glucose: Lab Results  Component Value Date   GLUCAP 99 03/16/2016     Pulmonary Assessment Scores: Pulmonary Assessment Scores    Row Name 08/12/17 0758         ADL UCSD   ADL Phase  Entry        mMRC Score   mMRC Score  2        Pulmonary Function Assessment: Pulmonary Function Assessment - 07/30/17 1147      Breath   Bilateral Breath Sounds  Clear    Shortness of Breath  Yes;Limiting activity       Exercise Target Goals:    Exercise Program Goal: Individual exercise prescription set using results from initial 6 min walk test and THRR while considering  patient's activity barriers and safety.    Exercise Prescription Goal: Initial exercise prescription builds to 30-45 minutes a day of aerobic activity, 2-3 days per week.  Home exercise guidelines will be given to patient during program as part of exercise prescription that the participant will acknowledge.  Activity Barriers & Risk Stratification: Activity Barriers & Cardiac Risk Stratification - 07/30/17 1028      Activity Barriers & Cardiac Risk Stratification   Activity Barriers  Shortness of Breath;Balance Concerns    Cardiac Risk Stratification  Moderate       6 Minute Walk: 6 Minute Walk    Row Name 08/12/17 0737         6 Minute Walk   Phase  Initial     Distance  1000 feet     Walk Time  6 minutes     # of Rest Breaks  0     MPH  1.89     METS  2.45     RPE  13     Perceived Dyspnea   2     Symptoms  No     Resting HR  59 bpm     Resting BP  120/80     Resting Oxygen Saturation   95 %     Exercise Oxygen Saturation  during 6 min walk  90 %     Max Ex. HR  90 bpm     Max Ex. BP  144/80     2 Minute Post BP  128/80       Interval HR   1 Minute HR  68     2 Minute HR  83     3 Minute HR  87     4 Minute HR  90     5 Minute HR  90     6 Minute HR  70     2 Minute Post HR  63     Interval Heart Rate?  Yes       Interval  Oxygen   Interval Oxygen?  Yes     Baseline Oxygen Saturation %  95 %     1 Minute Oxygen Saturation %  94 %     1 Minute Liters of Oxygen  0 L     2 Minute Oxygen Saturation %  95 %     2 Minute Liters of Oxygen  0 L     3 Minute Oxygen Saturation %  90 %     3  Minute Liters of Oxygen  0 L     4 Minute Oxygen Saturation %  90 %     4 Minute Liters of Oxygen  0 L     5 Minute Oxygen Saturation %  90 %     5 Minute Liters of Oxygen  0 L     6 Minute Oxygen Saturation %  92 %     6 Minute Liters of Oxygen  0 L     2 Minute Post Oxygen Saturation %  95 %     2 Minute Post Liters of Oxygen  0 L        Oxygen Initial Assessment: Oxygen Initial Assessment - 08/12/17 0737      Initial 6 min Walk   Oxygen Used  None      Program Oxygen Prescription   Program Oxygen Prescription  None       Oxygen Re-Evaluation: Oxygen Re-Evaluation    Row Name 09/07/17 0749 10/01/17 1048 10/25/17 0957         Program Oxygen Prescription   Program Oxygen Prescription  None  None  None       Home Oxygen   Home Oxygen Device  None  None  None     Sleep Oxygen Prescription  None  None  None     Home Exercise Oxygen Prescription  None  None  None     Home at Rest Exercise Oxygen Prescription  None  None  None        Oxygen Discharge (Final Oxygen Re-Evaluation): Oxygen Re-Evaluation - 10/25/17 0957      Program Oxygen Prescription   Program Oxygen Prescription  None      Home Oxygen   Home Oxygen Device  None    Sleep Oxygen Prescription  None    Home Exercise Oxygen Prescription  None    Home at Rest Exercise Oxygen Prescription  None       Initial Exercise Prescription: Initial Exercise Prescription - 08/12/17 0700      Date of Initial Exercise RX and Referring Provider   Date  08/12/17    Referring Provider  Dr. Melvyn Novas      Recumbant Bike   Level  1    Watts  10    Minutes  17      NuStep   Level  2    SPM  80    Minutes  17    METs  1.5      Track   Laps  5    Minutes  17      Prescription Details   Frequency (times per week)  2    Duration  Progress to 45 minutes of aerobic exercise without signs/symptoms of physical distress      Intensity   THRR 40-80% of Max Heartrate  57-114    Ratings of Perceived Exertion  11-13     Perceived Dyspnea  0-4      Progression   Progression  Continue  progressive overload as per policy without signs/symptoms or physical distress.      Resistance Training   Training Prescription  Yes    Weight  orange bands    Reps  10-15       Perform Capillary Blood Glucose checks as needed.  Exercise Prescription Changes:  Exercise Prescription Changes    Row Name 08/17/17 1200 09/14/17 1300 09/23/17 1115 09/23/17 1518 10/12/17 1200     Response to Exercise   Blood Pressure (Admit)  106/70  120/74  118/60  -  122/60   Blood Pressure (Exercise)  140/80  140/70  120/70  -  138/70   Blood Pressure (Exit)  110/60  120/70  108/60  -  105/58   Heart Rate (Admit)  65 bpm  61 bpm  57 bpm  -  62 bpm   Heart Rate (Exercise)  94 bpm  96 bpm  86 bpm  -  73 bpm   Heart Rate (Exit)  99 bpm  62 bpm  66 bpm  -  64 bpm   Oxygen Saturation (Admit)  95 %  96 %  93 %  -  94 %   Oxygen Saturation (Exercise)  91 %  93 %  95 %  -  91 %   Oxygen Saturation (Exit)  98 %  93 %  93 %  -  94 %   Rating of Perceived Exertion (Exercise)  '14  14  13  '$ -  15   Perceived Dyspnea (Exercise)  '2  2  2  '$ -  3   Duration  Progress to 45 minutes of aerobic exercise without signs/symptoms of physical distress  Progress to 45 minutes of aerobic exercise without signs/symptoms of physical distress  Progress to 45 minutes of aerobic exercise without signs/symptoms of physical distress  -  Progress to 45 minutes of aerobic exercise without signs/symptoms of physical distress   Intensity  THRR unchanged  THRR unchanged  THRR unchanged  -  THRR unchanged     Progression   Progression  Continue to progress workloads to maintain intensity without signs/symptoms of physical distress.  Continue to progress workloads to maintain intensity without signs/symptoms of physical distress.  Continue to progress workloads to maintain intensity without signs/symptoms of physical distress.  -  Continue to progress workloads to maintain  intensity without signs/symptoms of physical distress.     Resistance Training   Training Prescription  Yes  Yes  Yes  -  Yes   Weight  orange bands  orange bands  orange bands  -  orange bands   Reps  10-15  10-15  10-15  -  10-15   Time  -  10 Minutes  10 Minutes  -  10 Minutes     Interval Training   Interval Training  -  -  No  -  No     Recumbant Bike   Level  2  3  -  -  3   Watts  10  10  -  -  10   Minutes  17  17  -  -  17     NuStep   Level  '2  4  5  '$ -  4   SPM  80  80  80  -  80   Minutes  '17  17  17  '$ -  17   METs  2.5  2.6  2.7  -  3.4     Track  Laps  '12  17  12  '$ -  17   Minutes  '17  17  17  '$ -  17     Home Exercise Plan   Plans to continue exercise at  -  -  -  [employer] (comment)  -   Frequency  -  -  -  Add 2 additional days to program exercise sessions.  -   Row Name 10/26/17 1100             Response to Exercise   Blood Pressure (Admit)  118/68       Blood Pressure (Exercise)  130/80       Blood Pressure (Exit)  114/68       Heart Rate (Admit)  69 bpm       Heart Rate (Exercise)  102 bpm       Heart Rate (Exit)  69 bpm       Oxygen Saturation (Admit)  95 %       Oxygen Saturation (Exercise)  93 %       Oxygen Saturation (Exit)  96 %       Rating of Perceived Exertion (Exercise)  13       Perceived Dyspnea (Exercise)  2       Duration  Progress to 45 minutes of aerobic exercise without signs/symptoms of physical distress       Intensity  THRR unchanged         Progression   Progression  Continue to progress workloads to maintain intensity without signs/symptoms of physical distress.         Resistance Training   Training Prescription  Yes       Weight  orange bands       Reps  10-15       Time  10 Minutes         Interval Training   Interval Training  No         NuStep   Level  5       SPM  80       Minutes  17       METs  3         Track   Laps  12       Minutes  17          Exercise Comments:  Exercise Comments     Row Name 09/24/17 1518           Exercise Comments  Home exercise completed          Exercise Goals and Review:  Exercise Goals    [place] Name 07/30/17 1034 07/30/17 1133           Exercise Goals   Increase Physical Activity  Yes  -      Intervention  Provide advice, education, support and counseling about physical activity/exercise needs.;Develop an individualized exercise prescription for aerobic and resistive training based on initial evaluation findings, risk stratification, comorbidities and participant's personal goals.  -      Expected Outcomes  Short Term: Attend rehab on a regular basis to increase amount of physical activity.;Long Term: Add in home exercise to make exercise part of routine and to increase amount of physical activity.;Long Term: Exercising regularly at least 3-5 days a week.  Short Term: Attend rehab on a regular basis to increase amount of physical activity.;Long Term: Add in home exercise to make exercise part of routine and to increase amount of  physical activity.;Long Term: Exercising regularly at least 3-5 days a week.      Increase Strength and Stamina  Yes  -      Intervention  Provide advice, education, support and counseling about physical activity/exercise needs.;Develop an individualized exercise prescription for aerobic and resistive training based on initial evaluation findings, risk stratification, comorbidities and participant's personal goals.  -      Expected Outcomes  Short Term: Increase workloads from initial exercise prescription for resistance, speed, and METs.;Short Term: Perform resistance training exercises routinely during rehab and add in resistance training at home;Long Term: Improve cardiorespiratory fitness, muscular endurance and strength as measured by increased METs and functional capacity (6MWT)  -      Able to understand and use rate of perceived exertion (RPE) scale  Yes  -      Intervention  Provide education and explanation on  how to use RPE scale  -      Expected Outcomes  Short Term: Able to use RPE daily in rehab to express subjective intensity level;Long Term:  Able to use RPE to guide intensity level when exercising independently  -      Able to understand and use Dyspnea scale  Yes  -      Intervention  Provide education and explanation on how to use Dyspnea scale  -      Expected Outcomes  Short Term: Able to use Dyspnea scale daily in rehab to express subjective sense of shortness of breath during exertion;Long Term: Able to use Dyspnea scale to guide intensity level when exercising independently  -      Knowledge and understanding of Target Heart Rate Range (THRR)  Yes  -      Intervention  Provide education and explanation of THRR including how the numbers were predicted and where they are located for reference  -      Expected Outcomes  Short Term: Able to state/look up THRR;Long Term: Able to use THRR to govern intensity when exercising independently;Short Term: Able to use daily as guideline for intensity in rehab  -      Understanding of Exercise Prescription  Yes  -      Intervention  Provide education, explanation, and written materials on patient's individual exercise prescription  -      Expected Outcomes  Short Term: Able to explain program exercise prescription;Long Term: Able to explain home exercise prescription to exercise independently  -         Exercise Goals Re-Evaluation : Exercise Goals Re-Evaluation    Row Name 09/07/17 0749 10/01/17 1048 10/25/17 0957         Exercise Goal Re-Evaluation   Exercise Goals Review  Increase Strength and Stamina;Able to understand and use Dyspnea scale;Increase Physical Activity;Able to understand and use rate of perceived exertion (RPE) scale;Knowledge and understanding of Target Heart Rate Range (THRR);Understanding of Exercise Prescription  Increase Strength and Stamina;Able to understand and use Dyspnea scale;Increase Physical Activity;Able to understand  and use rate of perceived exertion (RPE) scale;Knowledge and understanding of Target Heart Rate Range (THRR);Understanding of Exercise Prescription  Increase Strength and Stamina;Able to understand and use Dyspnea scale;Increase Physical Activity;Able to understand and use rate of perceived exertion (RPE) scale;Knowledge and understanding of Target Heart Rate Range (THRR);Understanding of Exercise Prescription     Comments  Patient has only attended 5 rehab sessions. She is progressing well, however. She is able to walk up to 16 laps (200 ft each) in 15 minutes. Patient is  open to working at higher workloads. Will cont. to monitor and motivate. I will conduct home exercise prescription so the patient knows what exactly to do at home.   Patient is progressing well. She is able to walk up to 17 laps (200 ft each) in 15 minutes. Patient is open to working at higher workloads. Will cont. to monitor and motivate. Home exercise program has been conducted.    Patient is progressing well. She is able to walk up to 20 laps (200 ft each) in 15 minutes. Patient is open to working at higher workloads. Will cont. to monitor and motivate. Home exercise program has been conducted will encourage compliance.      Expected Outcomes  Through exercise at rehab and at home, the patient will decrease shortness of breath with daily activities and feel confident in carrying out an exercise regime at home.   Through exercise at rehab and at home, the patient will decrease shortness of breath with daily activities and feel confident in carrying out an exercise regime at home.   Through exercise at rehab and at home, the patient will decrease shortness of breath with daily activities and feel confident in carrying out an exercise regime at home.         Discharge Exercise Prescription (Final Exercise Prescription Changes): Exercise Prescription Changes - 10/26/17 1100      Response to Exercise   Blood Pressure (Admit)  118/68     Blood Pressure (Exercise)  130/80    Blood Pressure (Exit)  114/68    Heart Rate (Admit)  69 bpm    Heart Rate (Exercise)  102 bpm    Heart Rate (Exit)  69 bpm    Oxygen Saturation (Admit)  95 %    Oxygen Saturation (Exercise)  93 %    Oxygen Saturation (Exit)  96 %    Rating of Perceived Exertion (Exercise)  13    Perceived Dyspnea (Exercise)  2    Duration  Progress to 45 minutes of aerobic exercise without signs/symptoms of physical distress    Intensity  THRR unchanged      Progression   Progression  Continue to progress workloads to maintain intensity without signs/symptoms of physical distress.      Resistance Training   Training Prescription  Yes    Weight  orange bands    Reps  10-15    Time  10 Minutes      Interval Training   Interval Training  No      NuStep   Level  5    SPM  80    Minutes  17    METs  3      Track   Laps  12    Minutes  17       Nutrition:  Target Goals: Understanding of nutrition guidelines, daily intake of sodium '1500mg'$ , cholesterol '200mg'$ , calories 30% from fat and 7% or less from saturated fats, daily to have 5 or more servings of fruits and vegetables.  Biometrics:    Nutrition Therapy Plan and Nutrition Goals: Nutrition Therapy & Goals - 09/02/17 1234      Nutrition Therapy   Diet  Heart Healthy      Personal Nutrition Goals   Nutrition Goal  Describe the benefit of including fruits, vegetables, whole grains, and low-fat dairy products in a healthy meal plan.      Intervention Plan   Intervention  Prescribe, educate and counsel regarding individualized specific dietary modifications aiming towards  targeted core components such as weight, hypertension, lipid management, diabetes, heart failure and other comorbidities.    Expected Outcomes  Short Term Goal: Understand basic principles of dietary content, such as calories, fat, sodium, cholesterol and nutrients.;Long Term Goal: Adherence to prescribed nutrition plan.        Nutrition Assessments: Nutrition Assessments - 08/11/17 1514      Rate Your Plate Scores   Pre Score  55       Nutrition Goals Re-Evaluation:   Nutrition Goals Discharge (Final Nutrition Goals Re-Evaluation):   Psychosocial: Target Goals: Acknowledge presence or absence of significant depression and/or stress, maximize coping skills, provide positive support system. Participant is able to verbalize types and ability to use techniques and skills needed for reducing stress and depression.  Initial Review & Psychosocial Screening: Initial Psych Review & Screening - 07/30/17 1154      Family Dynamics   Comments  Pt has help from spouse but he has limited mobility and uses a walker.  Pt children [place] of [place].  Pt freinds are also elderly.  Pt feesl she manages the best you can.  (Pended)        Quality of Life Scores:  Scores of 19 and below usually indicate a poorer quality of life in these areas.  A difference of  2-3 points is a clinically meaningful difference.  A difference of 2-3 points in the total score of the Quality of Life Index has been associated with significant improvement in overall quality of life, self-image, physical symptoms, and general health in studies assessing change in quality of life.   PHQ-9: Recent Review Flowsheet Data    Depression screen [facility] 2/9 07/30/2017   Decreased Interest 1   Down, Depressed, Hopeless 1   PHQ - 2 Score 2   Altered sleeping 1   Tired, decreased energy 1   Change in appetite 0   Feeling bad or failure about yourself  1   Trouble concentrating 0   Moving slowly or fidgety/restless 0   Suicidal thoughts 0   PHQ-9 Score 5   Difficult doing work/chores Not difficult at all     Interpretation of Total Score  Total Score Depression Severity:  1-4 = Minimal depression, 5-9 = Mild depression, 10-14 = Moderate depression, 15-19 = Moderately severe depression, 20-27 = Severe depression   Psychosocial Evaluation and  Intervention: Psychosocial Evaluation - 10/25/17 1608      Psychosocial Evaluation & Interventions   Interventions  Stress management education;Relaxation education;Encouraged to exercise with the program and follow exercise prescription    Comments  Pt reports every day stress but does not feel this is more than she can manage.    Expected Outcomes  positive and healthy coping skills and learned techniques for stress managment    Continue Psychosocial Services   No Follow up required       Psychosocial Re-Evaluation: Psychosocial Re-Evaluation    [place] Name 09/06/17 1410 09/06/17 1411 10/04/17 1348 10/25/17 1608       Psychosocial Re-Evaluation   Current issues with  -  None Identified  None Identified  None Identified    Comments  -  Pt does not identify any reportable pyschosocial issues.  -  Pt does not identify any reportable pyschosocial issues.    Expected Outcomes  -  Pt demonstrate positive and healthy coping skills.  -  Pt demonstrate positive and healthy coping skills.    Interventions  Relaxation education;Stress management education;Encouraged to attend Pulmonary Rehabilitation  for the exercise  -  Encouraged to attend Pulmonary Rehabilitation for the exercise  Encouraged to attend Pulmonary Rehabilitation for the exercise    Continue Psychosocial Services   Follow up required by counselor  -  No Follow up required  No Follow up required       Psychosocial Discharge (Final Psychosocial Re-Evaluation): Psychosocial Re-Evaluation - 10/25/17 1608      Psychosocial Re-Evaluation   Current issues with  None Identified    Comments  Pt does not identify any reportable pyschosocial issues.    Expected Outcomes  Pt demonstrate positive and healthy coping skills.    Interventions  Encouraged to attend Pulmonary Rehabilitation for the exercise    Continue Psychosocial Services   No Follow up required       Education: Education Goals: Education classes will be provided on a  weekly basis, covering required topics. Participant will state understanding/return demonstration of topics presented.  Learning Barriers/Preferences: Learning Barriers/Preferences - 07/30/17 1026      Learning Barriers/Preferences   Learning Barriers  Sight;Hearing wears glasses  wears hearing aids    Learning Preferences  Written Material;Pictoral;Group Instruction       Education Topics: Risk Factor Reduction:  -Group instruction that is supported by a PowerPoint presentation. Instructor discusses the definition of a risk factor, different risk factors for pulmonary disease, and how the heart and lungs work together.     Nutrition for Pulmonary Patient:  -Group instruction provided by PowerPoint slides, verbal discussion, and written materials to support subject matter. The instructor gives an explanation and review of healthy diet recommendations, which includes a discussion on weight management, recommendations for fruit and vegetable consumption, as well as protein, fluid, caffeine, fiber, sodium, sugar, and alcohol. Tips for eating when patients are short of breath are discussed.   PULMONARY REHAB CHRONIC OBSTRUCTIVE PULMONARY DISEASE from 10/14/2017 in [place]  Date  08/19/17  Educator  edna  Instruction Review Code  2- Demonstrated Understanding      Pursed Lip Breathing:  -Group instruction that is supported by demonstration and informational handouts. Instructor discusses the benefits of pursed lip and diaphragmatic breathing and detailed demonstration on how to preform both.     Oxygen Safety:  -Group instruction provided by PowerPoint, verbal discussion, and written material to support subject matter. There is an overview of "What is Oxygen" and "Why do we need it".  Instructor also reviews how to create a safe environment for oxygen use, the importance of using oxygen as prescribed, and the risks of noncompliance. There is a brief  discussion on traveling with oxygen and resources the patient may utilize.   Oxygen Equipment:  -Group instruction provided by [facility] Staff utilizing handouts, written materials, and equipment demonstrations.   Signs and Symptoms:  -Group instruction provided by written material and verbal discussion to support subject matter. Warning signs and symptoms of infection, stroke, and heart attack are reviewed and when to call the physician/911 reinforced. Tips for preventing the spread of infection discussed.   PULMONARY REHAB CHRONIC OBSTRUCTIVE PULMONARY DISEASE from 10/14/2017 in [place]  Date  10/14/17  Educator  Remo Lipps  Instruction Review Code  1- Verbalizes Understanding      Advanced Directives:  -Group instruction provided by verbal instruction and written material to support subject matter. Instructor reviews Advanced Directive laws and proper instruction for filling out document.   Pulmonary Video:  -Group video education that reviews the importance of medication  and oxygen compliance, exercise, good nutrition, pulmonary hygiene, and pursed lip and diaphragmatic breathing for the pulmonary patient.   Exercise for the Pulmonary Patient:  -Group instruction that is supported by a PowerPoint presentation. Instructor discusses benefits of exercise, core components of exercise, frequency, duration, and intensity of an exercise routine, importance of utilizing pulse oximetry during exercise, safety while exercising, and options of places to exercise outside of rehab.     Pulmonary Medications:  -Verbally interactive group education provided by instructor with focus on inhaled medications and proper administration.   PULMONARY REHAB CHRONIC OBSTRUCTIVE PULMONARY DISEASE from 10/14/2017 in [place]  Date  09/23/17  Educator  Pharmacy  Instruction Review Code  1- Verbalizes Understanding      Anatomy and Physiology  of the Respiratory System and Intimacy:  -Group instruction provided by PowerPoint, verbal discussion, and written material to support subject matter. Instructor reviews respiratory cycle and anatomical components of the respiratory system and their functions. Instructor also reviews differences in obstructive and restrictive respiratory diseases with examples of each. Intimacy, Sex, and Sexuality differences are reviewed with a discussion on how relationships can change when diagnosed with pulmonary disease. Common sexual concerns are reviewed.   PULMONARY REHAB CHRONIC OBSTRUCTIVE PULMONARY DISEASE from 10/14/2017 in [place]  Date  09/02/17  Educator  rn  Instruction Review Code  2- Demonstrated Understanding      MD DAY -A group question and answer session with a medical doctor that allows participants to ask questions that relate to their pulmonary disease state.   PULMONARY REHAB CHRONIC OBSTRUCTIVE PULMONARY DISEASE from 10/14/2017 in [place]  Date  09/09/17  Educator  Nelda Marseille  Instruction Review Code  2- Demonstrated Understanding      OTHER EDUCATION -Group or individual verbal, written, or video instructions that support the educational goals of the pulmonary rehab program.   PULMONARY REHAB CHRONIC OBSTRUCTIVE PULMONARY DISEASE from 10/14/2017 in [place]  Date  09/16/17  Educator  EP Ernie Hew [place]  Instruction Review Code  2- Demonstrated Understanding      Holiday Eating Survival Tips:  -Group instruction provided by PowerPoint slides, verbal discussion, and written materials to support subject matter. The instructor gives patients tips, tricks, and techniques to help them not only survive but enjoy the holidays despite the onslaught of food that accompanies the holidays.   Knowledge Questionnaire Score:   Core Components/Risk Factors/Patient Goals at  Admission: Personal Goals and Risk Factors at Admission - 07/30/17 1019      Core Components/Risk Factors/Patient Goals on Admission    Weight Management  Weight Loss    Improve shortness of breath with ADL's  Yes    Intervention  Provide education, individualized exercise plan and daily activity instruction to help decrease symptoms of SOB with activities of daily living.    Expected Outcomes  Short Term: Improve cardiorespiratory fitness to achieve a reduction of symptoms when performing ADLs    Hypertension  Yes    Intervention  Provide education on lifestyle modifcations including regular physical activity/exercise, weight management, moderate sodium restriction and increased consumption of fresh fruit, vegetables, and low fat dairy, alcohol moderation, and smoking cessation.;Monitor prescription use compliance.    Expected Outcomes  Short Term: Continued assessment and intervention until BP is < 140/77m HG in hypertensive participants. < 130/873mHG in hypertensive participants with diabetes, heart failure or chronic kidney disease.;Long Term: Maintenance of blood pressure  at goal levels.    Lipids  Yes    Intervention  Provide education and support for participant on nutrition & aerobic/resistive exercise along with prescribed medications to achieve LDL '70mg'$ , HDL >'40mg'$ .    Expected Outcomes  Short Term: Participant states understanding of desired cholesterol values and is compliant with medications prescribed. Participant is following exercise prescription and nutrition guidelines.;Long Term: Cholesterol controlled with medications as prescribed, with individualized exercise RX and with personalized nutrition plan. Value goals: LDL < '70mg'$ , HDL > 40 mg.    Stress  Yes    Intervention  Offer individual and/or small group education and counseling on adjustment to heart disease, stress management and health-related lifestyle change. Teach and support self-help strategies.;Refer participants  experiencing significant psychosocial distress to appropriate mental health specialists for further evaluation and treatment. When possible, include family members and significant others in education/counseling sessions.    Expected Outcomes  Short Term: Participant demonstrates changes in health-related behavior, relaxation and other stress management skills, ability to obtain effective social support, and compliance with psychotropic medications if prescribed.;Long Term: Emotional wellbeing is indicated by absence of clinically significant psychosocial distress or social isolation.    Personal Goal Other  Yes    Personal Goal  Be able travel with short trips with ease and no shortness of breath    Expected Outcomes  Pt will be able to travel to [place] for her grandson high school graduation in [place].   Pt will be able to travel to [place] for vacation in June.       Core Components/Risk Factors/Patient Goals Review:  Goals and Risk Factor Review    Row Name 09/06/17 1348 10/04/17 1342 10/25/17 1602 10/27/17 1151       Core Components/Risk Factors/Patient Goals Review   Personal Goals Review  Stress;Hypertension;Lipids;Improve shortness of breath with ADL's;Weight Management/Obesity  Weight Management/Obesity;Lipids;Hypertension;Stress  Weight Management/Obesity;Stress  -    Review  Pt is fairly new to pulmonary rehab.  However pt is making progress toward her goals.  Pt weight remains stable with no weight gain noted.  Pt met with RD on 5/31 for discussion of strategies and eating plan.  Pt attending nutrition education class on 5/16.Pt continues to desire to lose weight.  Pt BP remain well within normal limits with the expected increase with activity and the return to pt baseline post exercise - 120's -130's/60-70's.  Pt is compliant with her medications.  pt reports she is using strategies to help with purse lip breathing such as PLB and rest breaks.   Pt is planning to attend her  grandson high school graduation in [place].  Will check back in with pt to see how she managed for the trip.  Pt is planning a vacation later this month to [place].  Will check back in with pt for progress toward the ability to travel to the beach for vacation.  -  Pt has completed 15 exercise sessions.  Pt weight shows increase of 1.3 kg. (however this was post beach trip) Pt continues to desire to lose weight.  Pt BP remain well within normal limits with the expected increase with activity and the return to pt baseline post exercise - 120's -130's/60-70's.  Pt is compliant with her medications. Will resolve this patient goal. pt reports she is using strategies to help with purse lip breathing such as PLB and rest breaks.  Pt has completed 15 exercise sessions.  Pt weight shows increase of 1.3 kg. (however this was post  beach trip) Pt continues to desire to lose weight.  Pt works hard with exercise increase in laps to all time high of 20 laps,  RB level 3 and nustep at 5Pt BP remain well within normal limits with the expected increase with activity and the return to pt baseline post exercise - 120's -130's/60-70's.  Pt is compliant with her medications. Will resolve this patient goal. pt reports she is using strategies to help with purse lip breathing such as PLB and rest breaks.    Expected Outcomes  See "admission expected outcomes"  Is presently on a trip to [place] and I am anxious to see if she was able to enjoy her time and be more active than before she started the program.  Her hypertension , lipids, and stress are controlled.  She has not lost any weight since starting the program.  See Admission Goals  See Admission Goals/Outcomes       Core Components/Risk Factors/Patient Goals at Discharge (Final Review):  Goals and Risk Factor Review - 10/27/17 1151      Core Components/Risk Factors/Patient Goals Review   Review  Pt has completed 15 exercise sessions.  Pt weight shows increase of  1.3 kg. (however this was post beach trip) Pt continues to desire to lose weight.  Pt works hard with exercise increase in laps to all time high of 20 laps,  RB level 3 and nustep at 5Pt BP remain well within normal limits with the expected increase with activity and the return to pt baseline post exercise - 120's -130's/60-70's.  Pt is compliant with her medications. Will resolve this patient goal. pt reports she is using strategies to help with purse lip breathing such as PLB and rest breaks.    Expected Outcomes  See Admission Goals/Outcomes       ITP Comments: ITP Comments    Row Name 07/30/17 1011 09/08/17 1032 10/25/17 1602       ITP Comments  Dr. Jennet Maduro, Medical Director  Dr. Jennet Maduro, Medical Director  Dr. Jennet Maduro, Medical Director        Comments:  Pt completed 15 exercise sessions.  Pt has completed face to face with Dr. Jennet Maduro and psychosocial assessments at least every 30 days. Cherre Huger, BSN Cardiac and Training and development officer

## 2017-10-26 ENCOUNTER — Encounter (HOSPITAL_COMMUNITY)
Admission: RE | Admit: 2017-10-26 | Discharge: 2017-10-26 | Disposition: A | Discharge status: Home / Self Care | Payer: Medicare Other | Source: Ambulatory Visit | Attending: Internal Medicine | Admitting: Internal Medicine

## 2017-10-26 VITALS — Wt 168.0 lb

## 2017-10-26 DIAGNOSIS — J449 Chronic obstructive pulmonary disease, unspecified: Secondary | ICD-10-CM

## 2017-10-26 NOTE — Progress Notes (Signed)
Daily Session Note  Patient Details  Name: [name] MRN: [id number] Date of Birth: 05/06/1940 Referring Provider:     Pulmonary Rehab Walk Test from 08/10/2017 in [facility]  Referring Provider  Dr. Wert      Encounter Date: 10/26/2017  Check In: Session Check In - 10/26/17 1030      Check-In   Location  [facility]    Staff Present   , RN, BSN;[name] Carlton, RN, BSN;Molly DiVincenzo, MS, ACSM RCEP, Exercise Physiologist;Lisa Hughes, RN;Annedrea Stackhouse, RN, MHA    Supervising physician immediately available to respond to emergencies  [facility] immediately available    Physician(s)  Dr. Emokpae    Medication changes reported      No    Fall or balance concerns reported     No    Tobacco Cessation  No Change    Warm-up and Cool-down  Performed as group-led instruction    Resistance Training Performed  Yes    VAD Patient?  No    PAD/SET Patient?  No      Pain Assessment   Currently in Pain?  No/denies    Multiple Pain Sites  No       Capillary Blood Glucose: No results found for this or any previous visit (from the past 24 hour(s)).  Exercise Prescription Changes - 10/26/17 1100      Response to Exercise   Blood Pressure (Admit)  118/68    Blood Pressure (Exercise)  130/80    Blood Pressure (Exit)  114/68    Heart Rate (Admit)  69 bpm    Heart Rate (Exercise)  102 bpm    Heart Rate (Exit)  69 bpm    Oxygen Saturation (Admit)  95 %    Oxygen Saturation (Exercise)  93 %    Oxygen Saturation (Exit)  96 %    Rating of Perceived Exertion (Exercise)  13    Perceived Dyspnea (Exercise)  2    Duration  Progress to 45 minutes of aerobic exercise without signs/symptoms of physical distress    Intensity  THRR unchanged      Progression   Progression  Continue to progress workloads to maintain intensity without signs/symptoms of physical distress.      Resistance Training   Training Prescription   Yes    Weight  orange bands    Reps  10-15    Time  10 Minutes      Interval Training   Interval Training  No      NuStep   Level  5    SPM  80    Minutes  17    METs  3      Track   Laps  12    Minutes  17       Social History   Tobacco Use  Smoking Status Former Smoker  . Packs/day: 2.00  . Years: 20.00  . Pack years: 40.00  . Types: Cigarettes  . Last attempt to quit: 04/06/1984  . Years since quitting: 33.5  Smokeless Tobacco Never Used    Goals Met:  Exercise tolerated well Strength training completed today  Goals Unmet:  Not Applicable  Comments: Service time is from 1030 to 1140    Dr. Wesam G. Yacoub is Medical Director for Pulmonary Rehab at [facility]. 

## 2017-10-27 NOTE — Progress Notes (Signed)
[name] 77 y.o. female  2890 day Psychosocial Note  Patient psychosocial assessment reveals no barriers to participation in Pulmonary Rehab. Patient does feel she is making progress toward Pulmonary Rehab goals. Patient reports her health and activity level has improved in the past 30 days as evidenced by patient's report of increased ability to exercise ambulated 15 laps around the track. Patient states family/friends have noticed changes in her activity or mood. Patient reports feeling positive about current and projected progression in Pulmonary Rehab. After reviewing the patient's treatment plan, the patient is making progress toward Pulmonary Rehab goals. Patient's rate of progress toward rehab goals is good. Plan of action to help patient continue to work towards rehab goals include increasing workloads on equipment as tolerated. Will continue to monitor and evaluate progress toward psychosocial goal(s).  Her goal was to be able to enjoy family.  Pt likes being more active than she has been in the past.  Goal(s) in progress: Increase workloads as appropriate and tolerated Improved coping skills Help patient work toward returning to meaningful activities that improve patient's QOL and are attainable with patient's lung disease  [name] [name] RN, BSN Cardiac and Emergency planning/management officerulmonary Rehab Nurse Navigator

## 2017-10-28 ENCOUNTER — Encounter (HOSPITAL_COMMUNITY)
Admission: RE | Admit: 2017-10-28 | Discharge: 2017-10-28 | Disposition: A | Discharge status: Home / Self Care | Payer: Medicare Other | Source: Ambulatory Visit | Attending: Internal Medicine | Admitting: Internal Medicine

## 2017-10-28 DIAGNOSIS — J449 Chronic obstructive pulmonary disease, unspecified: Secondary | ICD-10-CM

## 2017-10-28 NOTE — Progress Notes (Signed)
[name] presents to pulmonary rehab for her bi-weekly exercise session. I have completed her thirty day face to face review and determined that [name] EmperorFaith S Hakimi is on track for meeting their pulmonary rehab goals. There are no barriers identified that will prevent them from continuing their exercise in pulmonary rehab as prescribed.   Alyson ReedyWesam G. Emeli Goguen, M.D. [facility]. Pager: [phone]. After hours pager: [phone].

## 2017-10-28 NOTE — Progress Notes (Signed)
Daily Session Note  Patient Details  Name: [name] MRN: [id number] Date of Birth: 1940-08-13 Referring Provider:     Pulmonary Rehab Walk Test from 08/10/2017 in [place]  Referring Provider  Dr. Melvyn Novas      Encounter Date: 10/28/2017  Check In: Session Check In - 10/28/17 1110      Check-In   Supervising physician immediately available to respond to emergencies  [facility] immediately available    Physician(s)  Dr. Tawanna Solo    Location  [facility]    Staff Present  Maurice Small, RN, BSN;Molly DiVincenzo, MS, ACSM RCEP, Exercise Physiologist;Ival Basquez Ysidro Evert, RN    Medication changes reported      No    Fall or balance concerns reported     No    Tobacco Cessation  No Change    Warm-up and Cool-down  Performed as group-led instruction    Resistance Training Performed  Yes    VAD Patient?  No    PAD/SET Patient?  No      Pain Assessment   Currently in Pain?  No/denies    Multiple Pain Sites  No       Capillary Blood Glucose: No results found for this or any previous visit (from the past 24 hour(s)).    Social History   Tobacco Use  Smoking Status Former Smoker  . Packs/day: 2.00  . Years: 20.00  . Pack years: 40.00  . Types: Cigarettes  . Last attempt to quit: 04/06/1984  . Years since quitting: 33.5  Smokeless Tobacco Never Used    Goals Met:  Exercise tolerated well No report of cardiac concerns or symptoms Strength training completed today  Goals Unmet:  Not Applicable  Comments: Service time is from 1030 to 1230    Dr. Rush Farmer is Medical Director for Pulmonary Rehab at [facility].

## 2017-11-02 ENCOUNTER — Encounter (HOSPITAL_COMMUNITY)
Admission: RE | Admit: 2017-11-02 | Discharge: 2017-11-02 | Disposition: A | Discharge status: Home / Self Care | Payer: Medicare Other | Source: Ambulatory Visit | Attending: Internal Medicine | Admitting: Internal Medicine

## 2017-11-02 DIAGNOSIS — J449 Chronic obstructive pulmonary disease, unspecified: Secondary | ICD-10-CM | POA: Diagnosis not present

## 2017-11-02 NOTE — Progress Notes (Signed)
Daily Session Note  Patient Details  Name: [name] MRN: [id number] Date of Birth: Oct 02, 1940 Referring Provider:     Pulmonary Rehab Walk Test from 08/10/2017 in [place]  Referring Provider  Dr. Melvyn Novas      Encounter Date: 11/02/2017  Check In: Session Check In - 11/02/17 1149      Check-In   Supervising physician immediately available to respond to emergencies  [facility] immediately available    Physician(s)  Dr. Rodena Piety    Location  [facility]    Staff Present  Maurice Small, RN, BSN;Molly DiVincenzo, MS, ACSM RCEP, Exercise Physiologist;Joscelyn Hardrick Ysidro Evert, RN    Medication changes reported      No    Fall or balance concerns reported     No    Tobacco Cessation  No Change    Warm-up and Cool-down  Performed as group-led instruction    Resistance Training Performed  Yes    VAD Patient?  No      Pain Assessment   Currently in Pain?  No/denies    Multiple Pain Sites  No       Capillary Blood Glucose: No results found for this or any previous visit (from the past 24 hour(s)).    Social History   Tobacco Use  Smoking Status Former Smoker  . Packs/day: 2.00  . Years: 20.00  . Pack years: 40.00  . Types: Cigarettes  . Last attempt to quit: 04/06/1984  . Years since quitting: 33.5  Smokeless Tobacco Never Used    Goals Met:  Exercise tolerated well No report of cardiac concerns or symptoms Strength training completed today  Goals Unmet:  Not Applicable  Comments: Service time is from 1030 to 1205    Dr. Rush Farmer is Medical Director for Pulmonary Rehab at [facility]

## 2017-11-04 ENCOUNTER — Encounter (HOSPITAL_COMMUNITY)
Admission: RE | Admit: 2017-11-04 | Discharge: 2017-11-04 | Disposition: A | Discharge status: Home / Self Care | Payer: Medicare Other | Source: Ambulatory Visit | Attending: Internal Medicine | Admitting: Internal Medicine

## 2017-11-04 DIAGNOSIS — J449 Chronic obstructive pulmonary disease, unspecified: Secondary | ICD-10-CM | POA: Diagnosis present

## 2017-11-04 NOTE — Progress Notes (Signed)
Daily Session Note  Patient Details  Name: [name] MRN: [id number] Date of Birth: 1940-08-07 Referring Provider:     Pulmonary Rehab Walk Test from 08/10/2017 in [place]  Referring Provider  Dr. Melvyn Novas      Encounter Date: 11/04/2017  Check In: Session Check In - 11/04/17 1108      Check-In   Supervising physician immediately available to respond to emergencies  [facility] immediately available    Physician(s)  Dr. Herbert Moors    Location  [facility]    Staff Present  Su Hilt, MS, ACSM RCEP, Exercise Physiologist;Lisa Ysidro Evert, RN;[name] Carlton, RN, BSN    Medication changes reported      No    Fall or balance concerns reported     No    Tobacco Cessation  No Change    Warm-up and Cool-down  Performed as group-led instruction    Resistance Training Performed  Yes    VAD Patient?  No    PAD/SET Patient?  No      Pain Assessment   Currently in Pain?  No/denies    Multiple Pain Sites  No       Capillary Blood Glucose: No results found for this or any previous visit (from the past 24 hour(s)).    Social History   Tobacco Use  Smoking Status Former Smoker  . Packs/day: 2.00  . Years: 20.00  . Pack years: 40.00  . Types: Cigarettes  . Last attempt to quit: 04/06/1984  . Years since quitting: 33.6  Smokeless Tobacco Never Used    Goals Met:  Exercise tolerated well No report of cardiac concerns or symptoms Strength training completed today  Goals Unmet:  Not Applicable  Comments: Service time is from 10:30a to 12:30p    Dr. Rush Farmer is Medical Director for Pulmonary Rehab at [facility].

## 2017-11-09 ENCOUNTER — Encounter (HOSPITAL_COMMUNITY)
Admission: RE | Admit: 2017-11-09 | Discharge: 2017-11-09 | Disposition: A | Discharge status: Home / Self Care | Payer: Medicare Other | Source: Ambulatory Visit | Attending: Internal Medicine | Admitting: Internal Medicine

## 2017-11-09 VITALS — Wt 167.3 lb

## 2017-11-09 DIAGNOSIS — J449 Chronic obstructive pulmonary disease, unspecified: Secondary | ICD-10-CM | POA: Diagnosis not present

## 2017-11-09 NOTE — Progress Notes (Signed)
Daily Session Note  Patient Details  Name: KEONI HAVEY MRN: [id number] Date of Birth: 1940/10/24 Referring Provider:     Pulmonary Rehab Walk Test from 08/10/2017 in [place]  Referring Provider  Dr. Melvyn Novas      Encounter Date: 11/09/2017  Check In: Session Check In - 11/09/17 1044      Check-In   Supervising physician immediately available to respond to emergencies  [facility] immediately available    Physician(s)  Dr. Reesa Chew    Location  [facility]    Staff Present  Su Hilt, MS, ACSM RCEP, Exercise Physiologist;Niyla Marone Ysidro Evert, RN;[name] Carlton, RN, BSN    Medication changes reported      No    Fall or balance concerns reported     No    Tobacco Cessation  No Change    Warm-up and Cool-down  Performed as group-led instruction    Resistance Training Performed  Yes    VAD Patient?  No    PAD/SET Patient?  No      Pain Assessment   Currently in Pain?  No/denies    Multiple Pain Sites  No       Capillary Blood Glucose: No results found for this or any previous visit (from the past 24 hour(s)).  Exercise Prescription Changes - 11/09/17 1200      Response to Exercise   Blood Pressure (Admit)  104/64    Blood Pressure (Exercise)  140/74    Blood Pressure (Exit)  112/70    Heart Rate (Admit)  70 bpm    Heart Rate (Exercise)  93 bpm    Heart Rate (Exit)  70 bpm    Oxygen Saturation (Admit)  95 %    Oxygen Saturation (Exercise)  91 %    Oxygen Saturation (Exit)  94 %    Rating of Perceived Exertion (Exercise)  13    Perceived Dyspnea (Exercise)  1    Duration  Progress to 45 minutes of aerobic exercise without signs/symptoms of physical distress    Intensity  THRR unchanged      Progression   Progression  Continue to progress workloads to maintain intensity without signs/symptoms of physical distress.      Resistance Training   Training Prescription  Yes    Weight  orange bands    Reps  10-15    Time  10  Minutes      Interval Training   Interval Training  No      Recumbant Bike   Level  3    Watts  10    Minutes  17      Track   Laps  17    Minutes  17       Social History   Tobacco Use  Smoking Status Former Smoker  . Packs/day: 2.00  . Years: 20.00  . Pack years: 40.00  . Types: Cigarettes  . Last attempt to quit: 04/06/1984  . Years since quitting: 33.6  Smokeless Tobacco Never Used    Goals Met:  Exercise tolerated well No report of cardiac concerns or symptoms Strength training completed today  Goals Unmet:  Not Applicable  Comments: Service time is from 1030 to 1215    Dr. Rush Farmer is Medical Director for Pulmonary Rehab at [facility].

## 2017-11-11 ENCOUNTER — Encounter (HOSPITAL_COMMUNITY)
Admission: RE | Admit: 2017-11-11 | Discharge: 2017-11-11 | Disposition: A | Discharge status: Home / Self Care | Payer: Medicare Other | Source: Ambulatory Visit | Attending: Internal Medicine | Admitting: Internal Medicine

## 2017-11-11 DIAGNOSIS — J449 Chronic obstructive pulmonary disease, unspecified: Secondary | ICD-10-CM | POA: Diagnosis not present

## 2017-11-11 NOTE — Progress Notes (Signed)
Daily Session Note  Patient Details  Name: CIDNEY KIRKWOOD MRN: [id number] Date of Birth: [birth year] Referring Provider:     Pulmonary Rehab Walk Test from 08/10/2017 in [place]  Referring Provider  Dr. Melvyn Novas      Encounter Date: 11/11/2017  Check In: Session Check In - 11/11/17 1236      Check-In   Supervising physician immediately available to respond to emergencies  [facility] immediately available    Physician(s)  Dr. Maryland Pink    Location  [facility]    Staff Present  Su Hilt, MS, ACSM RCEP, Exercise Physiologist;Lisa Colletta Maryland, RN, MHA    Medication changes reported      No    Fall or balance concerns reported     No    Tobacco Cessation  No Change    Warm-up and Cool-down  Performed as group-led instruction    Resistance Training Performed  Yes    VAD Patient?  No    PAD/SET Patient?  No      Pain Assessment   Currently in Pain?  No/denies    Multiple Pain Sites  No       Capillary Blood Glucose: No results found for this or any previous visit (from the past 24 hour(s)).    Social History   Tobacco Use  Smoking Status Former Smoker  . Packs/day: 2.00  . Years: 20.00  . Pack years: 40.00  . Types: Cigarettes  . Last attempt to quit: 04/06/1984  . Years since quitting: 33.6  Smokeless Tobacco Never Used    Goals Met:  Achieving weight loss Exercise tolerated well Personal goals reviewed  Goals Unmet:  Not Applicable  Comments: Service time is from 10:30a to 12:15p    Dr. Rush Farmer is Medical Director for Pulmonary Rehab at [facility].

## 2017-11-16 ENCOUNTER — Encounter (HOSPITAL_COMMUNITY)
Admission: RE | Admit: 2017-11-16 | Discharge: 2017-11-16 | Disposition: A | Discharge status: Home / Self Care | Payer: Medicare Other | Source: Ambulatory Visit | Attending: Internal Medicine | Admitting: Internal Medicine

## 2017-11-16 DIAGNOSIS — J449 Chronic obstructive pulmonary disease, unspecified: Secondary | ICD-10-CM

## 2017-11-16 NOTE — Progress Notes (Signed)
Daily Session Note  Patient Details  Name: [name] MRN: [id number] Date of Birth: [birth year] Referring Provider:     Pulmonary Rehab Walk Test from 08/10/2017 in [place]  Referring Provider  Dr. Melvyn Novas      Encounter Date: 11/16/2017  Check In: Session Check In - 11/16/17 1133      Check-In   Supervising physician immediately available to respond to emergencies  [facility] immediately available    Physician(s)  Dr. Florene Glen    Location  [facility]    Staff Present  Su Hilt, MS, ACSM RCEP, Exercise Physiologist;[name] Wilber Oliphant, RN, BSN;Ramon Dredge, RN, MHA;Keelan Pomerleau Ysidro Evert, RN    Medication changes reported      No    Fall or balance concerns reported     No    Tobacco Cessation  No Change    Warm-up and Cool-down  Performed as group-led instruction    Resistance Training Performed  Yes    VAD Patient?  No    PAD/SET Patient?  No      Pain Assessment   Currently in Pain?  No/denies       Capillary Blood Glucose: No results found for this or any previous visit (from the past 24 hour(s)).    Social History   Tobacco Use  Smoking Status Former Smoker  . Packs/day: 2.00  . Years: 20.00  . Pack years: 40.00  . Types: Cigarettes  . Last attempt to quit: 04/06/1984  . Years since quitting: 33.6  Smokeless Tobacco Never Used    Goals Met:  Exercise tolerated well No report of cardiac concerns or symptoms Strength training completed today  Goals Unmet:  Not Applicable  Comments: Service time is from 1030 to 1200    Dr. Rush Farmer is Medical Director for Pulmonary Rehab at [facility].

## 2017-11-18 ENCOUNTER — Encounter (HOSPITAL_COMMUNITY)
Admission: RE | Admit: 2017-11-18 | Discharge: 2017-11-18 | Disposition: A | Discharge status: Home / Self Care | Payer: Medicare Other | Source: Ambulatory Visit | Attending: Internal Medicine | Admitting: Internal Medicine

## 2017-11-18 DIAGNOSIS — J449 Chronic obstructive pulmonary disease, unspecified: Secondary | ICD-10-CM

## 2017-11-18 NOTE — Progress Notes (Signed)
Daily Session Note  Patient Details  Name: [name] MRN: [id number] Date of Birth: Sep 03, 1940 Referring Provider:     Pulmonary Rehab Walk Test from 08/10/2017 in [place]  Referring Provider  Dr. Melvyn Novas      Encounter Date: 11/18/2017  Check In: Session Check In - 11/18/17 1057      Check-In   Supervising physician immediately available to respond to emergencies  [facility] immediately available    Physician(s)  Dr. Florene Glen    Location  [facility]    Staff Present  Su Hilt, MS, ACSM RCEP, Exercise Physiologist;[name] Wilber Oliphant, RN, BSN;Ramon Dredge, RN, MHA    Medication changes reported      No    Fall or balance concerns reported     No    Tobacco Cessation  No Change    Warm-up and Cool-down  Performed as group-led instruction    Resistance Training Performed  Yes    VAD Patient?  No    PAD/SET Patient?  No      Pain Assessment   Currently in Pain?  No/denies    Multiple Pain Sites  No       Capillary Blood Glucose: No results found for this or any previous visit (from the past 24 hour(s)).    Social History   Tobacco Use  Smoking Status Former Smoker  . Packs/day: 2.00  . Years: 20.00  . Pack years: 40.00  . Types: Cigarettes  . Last attempt to quit: 04/06/1984  . Years since quitting: 33.6  Smokeless Tobacco Never Used    Goals Met:  Achieving weight loss Exercise tolerated well Personal goals reviewed  Goals Unmet:  Not Applicable  Comments: Service time is from 10:30a to 12:30p    Dr. Rush Farmer is Medical Director for Pulmonary Rehab at [facility].

## 2017-11-23 ENCOUNTER — Encounter (HOSPITAL_COMMUNITY)
Admission: RE | Admit: 2017-11-23 | Discharge: 2017-11-23 | Disposition: A | Discharge status: Home / Self Care | Payer: Medicare Other | Source: Ambulatory Visit | Attending: Internal Medicine | Admitting: Internal Medicine

## 2017-11-23 DIAGNOSIS — J449 Chronic obstructive pulmonary disease, unspecified: Secondary | ICD-10-CM | POA: Diagnosis not present

## 2017-11-23 NOTE — Progress Notes (Signed)
Daily Session Note  Patient Details  Name: [name] MRN: [id number] Date of Birth: Nov 11, 1940 Referring Provider:     Pulmonary Rehab Walk Test from 08/10/2017 in [place]  Referring Provider  Dr. Melvyn Novas      Encounter Date: 11/23/2017  Check In: Session Check In - 11/23/17 1234      Check-In   Supervising physician immediately available to respond to emergencies  [facility] immediately available    Physician(s)  Dr. Florene Glen    Location  [facility]    Staff Present  Su Hilt, MS, ACSM RCEP, Exercise Physiologist;[name] Wilber Oliphant, RN, BSN;Ramon Dredge, RN, MHA;Lisa Ysidro Evert, RN    Medication changes reported      No    Fall or balance concerns reported     No    Tobacco Cessation  No Change    Warm-up and Cool-down  Performed as group-led instruction    Resistance Training Performed  Yes    VAD Patient?  No    PAD/SET Patient?  No      Pain Assessment   Currently in Pain?  No/denies       Capillary Blood Glucose: No results found for this or any previous visit (from the past 24 hour(s)).  Exercise Prescription Changes - 11/23/17 1300      Response to Exercise   Blood Pressure (Admit)  108/64    Blood Pressure (Exercise)  118/80    Blood Pressure (Exit)  120/60    Heart Rate (Admit)  60 bpm    Heart Rate (Exercise)  97 bpm    Heart Rate (Exit)  67 bpm    Oxygen Saturation (Admit)  96 %    Oxygen Saturation (Exercise)  94 %    Oxygen Saturation (Exit)  93 %    Rating of Perceived Exertion (Exercise)  14    Perceived Dyspnea (Exercise)  2    Duration  Progress to 45 minutes of aerobic exercise without signs/symptoms of physical distress    Intensity  THRR unchanged      Progression   Progression  Continue to progress workloads to maintain intensity without signs/symptoms of physical distress.      Resistance Training   Training Prescription  Yes    Weight  orange bands    Reps  10-15    Time   10 Minutes      Interval Training   Interval Training  No      Recumbant Bike   Level  3    Watts  10    Minutes  17      NuStep   Level  5    SPM  80    Minutes  17    METs  2.5       Social History   Tobacco Use  Smoking Status Former Smoker  . Packs/day: 2.00  . Years: 20.00  . Pack years: 40.00  . Types: Cigarettes  . Last attempt to quit: 04/06/1984  . Years since quitting: 33.6  Smokeless Tobacco Never Used    Goals Met:  Exercise tolerated well Personal goals reviewed  Goals Unmet:  Not Applicable  Comments: Service time is from 10:30a to 12:30p    Dr. Rush Farmer is Medical Director for Pulmonary Rehab at [facility].

## 2017-11-23 NOTE — Progress Notes (Signed)
Pulmonary Individual Treatment Plan  Patient Details  Name: [name] MRN: [id number] Date of Birth: [birth year] Referring Provider:     Pulmonary Rehab Walk Test from 08/10/2017 in [place]  Referring Provider  Dr. Melvyn Novas      Initial Encounter Date:    Pulmonary Rehab Walk Test from 08/10/2017 in [place]  Date  08/12/17      Visit Diagnosis: No diagnosis found.  Patient's Home Medications on Admission:   Current Outpatient Medications:  .  aspirin EC 81 MG tablet, Take 1 tablet (81 mg total) daily by mouth., Disp: 90 tablet, Rfl: 3 .  calcium carbonate (OS-CAL) 600 MG TABS tablet, Take 600 mg by mouth 2 (two) times daily with a meal. , Disp: , Rfl:  .  Cholecalciferol (VITAMIN D3) 1000 units CAPS, Take 1,000 Units by mouth daily., Disp: , Rfl:  .  Glycopyrrolate-Formoterol (BEVESPI AEROSPHERE) 9-4.8 MCG/ACT AERO, Inhale 2 puffs into the lungs 2 (two) times daily., Disp: 1 Inhaler, Rfl: 11 .  loratadine (CLARITIN) 10 MG tablet, Take 10 mg by mouth daily as needed. , Disp: , Rfl:  .  losartan (COZAAR) 50 MG tablet, TAKE 1 TABLET (50 MG TOTAL) DAILY BY MOUTH., Disp: 90 tablet, Rfl: 1 .  metoprolol succinate (TOPROL-XL) 25 MG 24 hr tablet, Take 1 tablet by mouth daily., Disp: , Rfl:  .  nitroGLYCERIN (NITROSTAT) 0.4 MG SL tablet, Place 1 tablet (0.4 mg total) under the tongue every 5 (five) minutes as needed. For chest pain, Disp: 25 tablet, Rfl: 1 .  pantoprazole (PROTONIX) 40 MG tablet, Take 1 tablet by mouth daily., Disp: , Rfl:  .  rosuvastatin (CRESTOR) 20 MG tablet, Take 1 tablet (20 mg total) by mouth at bedtime., Disp: 90 tablet, Rfl: 3 .  traMADol (ULTRAM) 50 MG tablet, Take 50 mg by mouth every 6 (six) hours as needed., Disp: , Rfl:  .  Zoledronic Acid (RECLAST IV), Inject into the vein. Once Yearly, Disp: , Rfl:   Past Medical History: Past Medical History:  Diagnosis Date  . Compression fracture    T9  .  COPD (chronic obstructive pulmonary disease) ([place])   . Coronary artery disease 08/14/2009   a. 08/2009: Ant MI c/b v-fib arrest, s/p PTCA/BMS to LAD. (STENT Placement); b. 2013 Myoview: no ischemia; c. 11/2012 Cath: patent mLAD stent, RI 70 (stable), otw nonobs dzs, EF 65%;  d. 07/2016 Lexiscan MV: EF 56%, no ischemia/infarct.  [place] GERD (gastroesophageal reflux disease)   . HTN (hypertension)   . Hyperlipidemia   . Ischemic cardiomyopathy    a. EF 40-45% in 08/2009 at time of MI. b. Improved to 55-60% by June 2011; c. 03/2016 Echo: EF 55-60%, no rwma, Gr1 DD, mild LVH.  . Osteoarthritis   . Osteopenia   . PONV (postoperative nausea and vomiting)   . Postoperative groin pseudoaneurysm ([place])    a. 04/2010 following diagnostic cardiac cath, s/p compression.  . Rib fractures    left sided second and sixth rib   . Shingles   . TIA (transient ischemic attack)    a. 03/2016 MRI/MRA negative/unremarkable.  . Ventricular fibrillation ([place])    a. 08/2009: due to anterior MI.    Tobacco Use: Social History   Tobacco Use  Smoking Status Former Smoker  . Packs/day: 2.00  . Years: 20.00  . Pack years: 40.00  . Types: Cigarettes  . Last attempt to quit: 04/06/1984  . Years since quitting:  33.6  Smokeless Tobacco Never Used    Labs: Recent Review Flowsheet Data    Labs for ITP Cardiac and Pulmonary Rehab Latest Ref Rng & Units 05/21/2010 11/20/2010 11/15/2012 03/16/2016 03/17/2016   Cholestrol 0 - 200 mg/dL 157 176 - - 158   LDLCALC 0 - 99 mg/dL 58 54 - - 62   LDLDIRECT mg/dL - - - - -   HDL >40 mg/dL 92.20 111.60 - - 82   Trlycerides <150 mg/dL 36.0 50.0 - - 72   Hemoglobin A1c 4.8 - 5.6 % - - - - 5.8(H)   TCO2 0 - 100 mmol/L - - 28 30 -      Capillary Blood Glucose: Lab Results  Component Value Date   GLUCAP 99 03/16/2016     Pulmonary Assessment Scores: Pulmonary Assessment Scores    Row Name 08/12/17 0758 11/24/17 1519 11/25/17 0721     ADL UCSD   ADL Phase  Entry  Entry  Exit    SOB Score total  -  64  -     CAT Score   CAT Score  -  23  -     mMRC Score   mMRC Score  2  -  0      Pulmonary Function Assessment: Pulmonary Function Assessment - 07/30/17 1147      Breath   Bilateral Breath Sounds  Clear    Shortness of Breath  Yes;Limiting activity       Exercise Target Goals: Exercise Program Goal: Individual exercise prescription set using results from initial 6 min walk test and THRR while considering  patient's activity barriers and safety.   Exercise Prescription Goal: Initial exercise prescription builds to 30-45 minutes a day of aerobic activity, 2-3 days per week.  Home exercise guidelines will be given to patient during program as part of exercise prescription that the participant will acknowledge.  Activity Barriers & Risk Stratification: Activity Barriers & Cardiac Risk Stratification - 07/30/17 1028      Activity Barriers & Cardiac Risk Stratification   Activity Barriers  Shortness of Breath;Balance Concerns    Cardiac Risk Stratification  Moderate       6 Minute Walk: 6 Minute Walk    Row Name 08/12/17 0737 11/25/17 0720       6 Minute Walk   Phase  Initial  Discharge    Distance  1000 feet  1410 feet    Distance Feet Change  -  410 ft    Walk Time  6 minutes  6 minutes    # of Rest Breaks  0  0    MPH  1.89  2.67    METS  2.45  3.07    RPE  13  11    Perceived Dyspnea   2  1    Symptoms  No  No    Resting HR  59 bpm  61 bpm    Resting BP  120/80  108/64    Resting Oxygen Saturation   95 %  94 %    Exercise Oxygen Saturation  during 6 min walk  90 %  94 %    Max Ex. HR  90 bpm  94 bpm    Max Ex. BP  144/80  148/80    2 Minute Post BP  128/80  -      Interval HR   1 Minute HR  68  75    2 Minute HR  83  85  3 Minute HR  87  86    4 Minute HR  90  93    5 Minute HR  90  92    6 Minute HR  70  94    2 Minute Post HR  63  -    Interval Heart Rate?  Yes  Yes      Interval Oxygen   Interval Oxygen?  Yes  Yes     Baseline Oxygen Saturation %  95 %  94 %    1 Minute Oxygen Saturation %  94 %  95 %    1 Minute Liters of Oxygen  0 L  0 L    2 Minute Oxygen Saturation %  95 %  94 %    2 Minute Liters of Oxygen  0 L  0 L    3 Minute Oxygen Saturation %  90 %  95 %    3 Minute Liters of Oxygen  0 L  0 L    4 Minute Oxygen Saturation %  90 %  95 %    4 Minute Liters of Oxygen  0 L  0 L    5 Minute Oxygen Saturation %  90 %  94 %    5 Minute Liters of Oxygen  0 L  0 L    6 Minute Oxygen Saturation %  92 %  94 %    6 Minute Liters of Oxygen  0 L  0 L    2 Minute Post Oxygen Saturation %  95 %  -    2 Minute Post Liters of Oxygen  0 L  -       Oxygen Initial Assessment: Oxygen Initial Assessment - 08/12/17 0737      Initial 6 min Walk   Oxygen Used  None      Program Oxygen Prescription   Program Oxygen Prescription  None       Oxygen Re-Evaluation: Oxygen Re-Evaluation    Row Name 09/07/17 0749 10/01/17 1048 10/25/17 0957 11/22/17 0920       Program Oxygen Prescription   Program Oxygen Prescription  None  None  None  None      Home Oxygen   Home Oxygen Device  None  None  None  None    Sleep Oxygen Prescription  None  None  None  None    Home Exercise Oxygen Prescription  None  None  None  None    Home at Rest Exercise Oxygen Prescription  None  None  None  None       Oxygen Discharge (Final Oxygen Re-Evaluation): Oxygen Re-Evaluation - 11/22/17 0920      Program Oxygen Prescription   Program Oxygen Prescription  None      Home Oxygen   Home Oxygen Device  None    Sleep Oxygen Prescription  None    Home Exercise Oxygen Prescription  None    Home at Rest Exercise Oxygen Prescription  None       Initial Exercise Prescription: Initial Exercise Prescription - 08/12/17 0700      Date of Initial Exercise RX and Referring Provider   Date  08/12/17    Referring Provider  Dr. Melvyn Novas      Recumbant Bike   Level  1    Watts  10    Minutes  17      NuStep   Level  2    SPM   80    Minutes  17    METs  1.5      Track   Laps  5    Minutes  17      Prescription Details   Frequency (times per week)  2    Duration  Progress to 45 minutes of aerobic exercise without signs/symptoms of physical distress      Intensity   THRR 40-80% of Max Heartrate  57-114    Ratings of Perceived Exertion  11-13    Perceived Dyspnea  0-4      Progression   Progression  Continue progressive overload as per policy without signs/symptoms or physical distress.      Resistance Training   Training Prescription  Yes    Weight  orange bands    Reps  10-15       Perform Capillary Blood Glucose checks as needed.  Exercise Prescription Changes:  Exercise Prescription Changes    Row Name 08/17/17 1200 09/14/17 1300 09/23/17 1115 09/23/17 1518 10/12/17 1200     Response to Exercise   Blood Pressure (Admit)  106/70  120/74  118/60  -  122/60   Blood Pressure (Exercise)  140/80  140/70  120/70  -  138/70   Blood Pressure (Exit)  110/60  120/70  108/60  -  105/58   Heart Rate (Admit)  65 bpm  61 bpm  57 bpm  -  62 bpm   Heart Rate (Exercise)  94 bpm  96 bpm  86 bpm  -  73 bpm   Heart Rate (Exit)  99 bpm  62 bpm  66 bpm  -  64 bpm   Oxygen Saturation (Admit)  95 %  96 %  93 %  -  94 %   Oxygen Saturation (Exercise)  91 %  93 %  95 %  -  91 %   Oxygen Saturation (Exit)  98 %  93 %  93 %  -  94 %   Rating of Perceived Exertion (Exercise)  '14  14  13  '$ -  15   Perceived Dyspnea (Exercise)  '2  2  2  '$ -  3   Duration  Progress to 45 minutes of aerobic exercise without signs/symptoms of physical distress  Progress to 45 minutes of aerobic exercise without signs/symptoms of physical distress  Progress to 45 minutes of aerobic exercise without signs/symptoms of physical distress  -  Progress to 45 minutes of aerobic exercise without signs/symptoms of physical distress   Intensity  THRR unchanged  THRR unchanged  THRR unchanged  -  THRR unchanged     Progression   Progression  Continue to  progress workloads to maintain intensity without signs/symptoms of physical distress.  Continue to progress workloads to maintain intensity without signs/symptoms of physical distress.  Continue to progress workloads to maintain intensity without signs/symptoms of physical distress.  -  Continue to progress workloads to maintain intensity without signs/symptoms of physical distress.     Resistance Training   Training Prescription  Yes  Yes  Yes  -  Yes   Weight  orange bands  orange bands  orange bands  -  orange bands   Reps  10-15  10-15  10-15  -  10-15   Time  -  10 Minutes  10 Minutes  -  10 Minutes     Interval Training   Interval Training  -  -  No  -  No     Recumbant Bike   Level  2  3  -  -  3   Watts  10  10  -  -  10   Minutes  17  17  -  -  17     NuStep   Level  '2  4  5  '$ -  4   SPM  80  80  80  -  80   Minutes  '17  17  17  '$ -  17   METs  2.5  2.6  2.7  -  3.4     Track   Laps  '12  17  12  '$ -  17   Minutes  '17  17  17  '$ -  17     Home Exercise Plan   Plans to continue exercise at  -  -  [employer] (comment)  -   Frequency  -  -  -  Add 2 additional days to program exercise sessions.  -   Row Name 10/26/17 1100 11/09/17 1200 11/23/17 1300         Response to Exercise   Blood Pressure (Admit)  118/68  104/64  108/64     Blood Pressure (Exercise)  130/80  140/74  118/80     Blood Pressure (Exit)  114/68  112/70  120/60     Heart Rate (Admit)  69 bpm  70 bpm  60 bpm     Heart Rate (Exercise)  102 bpm  93 bpm  97 bpm     Heart Rate (Exit)  69 bpm  70 bpm  67 bpm     Oxygen Saturation (Admit)  95 %  95 %  96 %     Oxygen Saturation (Exercise)  93 %  91 %  94 %     Oxygen Saturation (Exit)  96 %  94 %  93 %     Rating of Perceived Exertion (Exercise)  '13  13  14     '$ Perceived Dyspnea (Exercise)  '2  1  2     '$ Duration  Progress to 45 minutes of aerobic exercise without signs/symptoms of physical distress  Progress to 45 minutes of aerobic exercise without  signs/symptoms of physical distress  Progress to 45 minutes of aerobic exercise without signs/symptoms of physical distress     Intensity  THRR unchanged  THRR unchanged  THRR unchanged       Progression   Progression  Continue to progress workloads to maintain intensity without signs/symptoms of physical distress.  Continue to progress workloads to maintain intensity without signs/symptoms of physical distress.  Continue to progress workloads to maintain intensity without signs/symptoms of physical distress.       Resistance Training   Training Prescription  Yes  Yes  Yes     Weight  orange bands  orange bands  orange bands     Reps  10-15  10-15  10-15     Time  10 Minutes  10 Minutes  10 Minutes       Interval Training   Interval Training  No  No  No       Recumbant Bike   Level  -  3  3     Watts  -  10  10     Minutes  -  17  17       NuStep   Level  5  -  5     SPM  80  -  80  Minutes  17  -  17     METs  3  -  2.5       Track   Laps  12  17  -     Minutes  17  17  -        Exercise Comments:  Exercise Comments    Row Name 09/24/17 1518 11/25/17 0722         Exercise Comments  Home exercise completed  Patient has graduated from Pulmonary Rehab. She increased her 6MWT by 410 ft. She will cont. to exercise at home and the [facility]. Encouraged to call us in the fuiture if she is unable to stick to an exercise regime.          Exercise Goals and Review:  Exercise Goals    Row Name 07/30/17 1034 07/30/17 1133           Exercise Goals   Increase Physical Activity  Yes  -      Intervention  Provide advice, education, support and counseling about physical activity/exercise needs.;Develop an individualized exercise prescription for aerobic and resistive training based on initial evaluation findings, risk stratification, comorbidities and participant's personal goals.  -      Expected Outcomes  Short Term: Attend rehab on a regular basis to increase amount of physical  activity.;Long Term: Add in home exercise to make exercise part of routine and to increase amount of physical activity.;Long Term: Exercising regularly at least 3-5 days a week.  Short Term: Attend rehab on a regular basis to increase amount of physical activity.;Long Term: Add in home exercise to make exercise part of routine and to increase amount of physical activity.;Long Term: Exercising regularly at least 3-5 days a week.      Increase Strength and Stamina  Yes  -      Intervention  Provide advice, education, support and counseling about physical activity/exercise needs.;Develop an individualized exercise prescription for aerobic and resistive training based on initial evaluation findings, risk stratification, comorbidities and participant's personal goals.  -      Expected Outcomes  Short Term: Increase workloads from initial exercise prescription for resistance, speed, and METs.;Short Term: Perform resistance training exercises routinely during rehab and add in resistance training at home;Long Term: Improve cardiorespiratory fitness, muscular endurance and strength as measured by increased METs and functional capacity (6MWT)  -      Able to understand and use rate of perceived exertion (RPE) scale  Yes  -      Intervention  Provide education and explanation on how to use RPE scale  -      Expected Outcomes  Short Term: Able to use RPE daily in rehab to express subjective intensity level;Long Term:  Able to use RPE to guide intensity level when exercising independently  -      Able to understand and use Dyspnea scale  Yes  -      Intervention  Provide education and explanation on how to use Dyspnea scale  -      Expected Outcomes  Short Term: Able to use Dyspnea scale daily in rehab to express subjective sense of shortness of breath during exertion;Long Term: Able to use Dyspnea scale to guide intensity level when exercising independently  -      Knowledge and understanding of Target Heart Rate  Range (THRR)  Yes  -      Intervention  Provide education and explanation of THRR including how the numbers were predicted and  where they are located for reference  -      Expected Outcomes  Short Term: Able to state/look up THRR;Long Term: Able to use THRR to govern intensity when exercising independently;Short Term: Able to use daily as guideline for intensity in rehab  -      Understanding of Exercise Prescription  Yes  -      Intervention  Provide education, explanation, and written materials on patient's individual exercise prescription  -      Expected Outcomes  Short Term: Able to explain program exercise prescription;Long Term: Able to explain home exercise prescription to exercise independently  -         Exercise Goals Re-Evaluation : Exercise Goals Re-Evaluation    Row Name 09/07/17 0749 10/01/17 1048 10/25/17 0957 11/22/17 0920       Exercise Goal Re-Evaluation   Exercise Goals Review  Increase Strength and Stamina;Able to understand and use Dyspnea scale;Increase Physical Activity;Able to understand and use rate of perceived exertion (RPE) scale;Knowledge and understanding of Target Heart Rate Range (THRR);Understanding of Exercise Prescription  Increase Strength and Stamina;Able to understand and use Dyspnea scale;Increase Physical Activity;Able to understand and use rate of perceived exertion (RPE) scale;Knowledge and understanding of Target Heart Rate Range (THRR);Understanding of Exercise Prescription  Increase Strength and Stamina;Able to understand and use Dyspnea scale;Increase Physical Activity;Able to understand and use rate of perceived exertion (RPE) scale;Knowledge and understanding of Target Heart Rate Range (THRR);Understanding of Exercise Prescription  Increase Strength and Stamina;Able to understand and use Dyspnea scale;Increase Physical Activity;Able to understand and use rate of perceived exertion (RPE) scale;Knowledge and understanding of Target Heart Rate Range  (THRR);Understanding of Exercise Prescription    Comments  Patient has only attended 5 rehab sessions. She is progressing well, however. She is able to walk up to 16 laps (200 ft each) in 15 minutes. Patient is open to working at higher workloads. Will cont. to monitor and motivate. I will conduct home exercise prescription so the patient knows what exactly to do at home.   Patient is progressing well. She is able to walk up to 17 laps (200 ft each) in 15 minutes. Patient is open to working at higher workloads. Will cont. to monitor and motivate. Home exercise program has been conducted.    Patient is progressing well. She is able to walk up to 20 laps (200 ft each) in 15 minutes. Patient is open to working at higher workloads. Will cont. to monitor and motivate. Home exercise program has been conducted will encourage compliance.   Patient is progressing well. She is able to walk up to 20 laps (200 ft each) in 15 minutes. Patient is open to working at higher workloads. Will cont. to monitor and motivate. Home exercise program has been conducted will encourage compliance. Patient will graduate on 11/23/17 and will cont to exercise at home and the [facility].     Expected Outcomes  Through exercise at rehab and at home, the patient will decrease shortness of breath with daily activities and feel confident in carrying out an exercise regime at home.   Through exercise at rehab and at home, the patient will decrease shortness of breath with daily activities and feel confident in carrying out an exercise regime at home.   Through exercise at rehab and at home, the patient will decrease shortness of breath with daily activities and feel confident in carrying out an exercise regime at home.   Through exercise at rehab and at home, the patient  will decrease shortness of breath with daily activities and feel confident in carrying out an exercise regime at home.        Discharge Exercise Prescription (Final Exercise  Prescription Changes): Exercise Prescription Changes - 11/23/17 1300      Response to Exercise   Blood Pressure (Admit)  108/64    Blood Pressure (Exercise)  118/80    Blood Pressure (Exit)  120/60    Heart Rate (Admit)  60 bpm    Heart Rate (Exercise)  97 bpm    Heart Rate (Exit)  67 bpm    Oxygen Saturation (Admit)  96 %    Oxygen Saturation (Exercise)  94 %    Oxygen Saturation (Exit)  93 %    Rating of Perceived Exertion (Exercise)  14    Perceived Dyspnea (Exercise)  2    Duration  Progress to 45 minutes of aerobic exercise without signs/symptoms of physical distress    Intensity  THRR unchanged      Progression   Progression  Continue to progress workloads to maintain intensity without signs/symptoms of physical distress.      Resistance Training   Training Prescription  Yes    Weight  orange bands    Reps  10-15    Time  10 Minutes      Interval Training   Interval Training  No      Recumbant Bike   Level  3    Watts  10    Minutes  17      NuStep   Level  5    SPM  80    Minutes  17    METs  2.5       Nutrition:  Target Goals: Understanding of nutrition guidelines, daily intake of sodium '1500mg'$ , cholesterol '200mg'$ , calories 30% from fat and 7% or less from saturated fats, daily to have 5 or more servings of fruits and vegetables.  Biometrics:    Nutrition Therapy Plan and Nutrition Goals: Nutrition Therapy & Goals - 09/02/17 1234      Nutrition Therapy   Diet  Heart Healthy      Personal Nutrition Goals   Nutrition Goal  Describe the benefit of including fruits, vegetables, whole grains, and low-fat dairy products in a healthy meal plan.      Intervention Plan   Intervention  Prescribe, educate and counsel regarding individualized specific dietary modifications aiming towards targeted core components such as weight, hypertension, lipid management, diabetes, heart failure and other comorbidities.    Expected Outcomes  Short Term Goal: Understand  basic principles of dietary content, such as calories, fat, sodium, cholesterol and nutrients.;Long Term Goal: Adherence to prescribed nutrition plan.       Nutrition Assessments: Nutrition Assessments - 11/24/17 1424      Rate Your Plate Scores   Pre Score  55    Post Score  40       Nutrition Goals Re-Evaluation: Nutrition Goals Re-Evaluation    [place] Name 11/24/17 1424             Goals   Current Weight  167 lb 5.3 oz (75.9 kg)       Nutrition Goal  Describe the benefit of including fruits, vegetables, whole grains, and low-fat dairy products in a healthy meal plan.       Comment  nutrition goal met          Nutrition Goals Discharge (Final Nutrition Goals Re-Evaluation): Nutrition Goals Re-Evaluation - 11/24/17 1424  Goals   Current Weight  167 lb 5.3 oz (75.9 kg)    Nutrition Goal  Describe the benefit of including fruits, vegetables, whole grains, and low-fat dairy products in a healthy meal plan.    Comment  nutrition goal met       Psychosocial: Target Goals: Acknowledge presence or absence of significant depression and/or stress, maximize coping skills, provide positive support system. Participant is able to verbalize types and ability to use techniques and skills needed for reducing stress and depression.  Initial Review & Psychosocial Screening: Initial Psych Review & Screening - 07/30/17 1154      Family Dynamics   Comments  Pt has help from spouse but he has limited mobility and uses a walker.  Pt children [place] of [place].  Pt freinds are also elderly.  Pt feesl she manages the best you can.  (Pended)        Quality of Life Scores:  Scores of 19 and below usually indicate a poorer quality of life in these areas.  A difference of  2-3 points is a clinically meaningful difference.  A difference of 2-3 points in the total score of the Quality of Life Index has been associated with significant improvement in overall quality of life, self-image, physical  symptoms, and general health in studies assessing change in quality of life.  PHQ-9: Recent Review Flowsheet Data    Depression screen [facility] 2/9 11/23/2017 07/30/2017   Decreased Interest 0 1   Down, Depressed, Hopeless 0 1   PHQ - 2 Score 0 2   Altered sleeping 1 1   Tired, decreased energy 1 1   Change in appetite 0 0   Feeling bad or failure about yourself  0 1   Trouble concentrating 0 0   Moving slowly or fidgety/restless 0 0   Suicidal thoughts 0 0   PHQ-9 Score 2 5   Difficult doing work/chores Not difficult at all Not difficult at all     Interpretation of Total Score  Total Score Depression Severity:  1-4 = Minimal depression, 5-9 = Mild depression, 10-14 = Moderate depression, 15-19 = Moderately severe depression, 20-27 = Severe depression   Psychosocial Evaluation and Intervention: Psychosocial Evaluation - 11/23/17 1634      Psychosocial Evaluation & Interventions   Comments  no identifiable barriers    Expected Outcomes  Pt displays positive and healthy coping skills and learned techniques for stress managment      Discharge Psychosocial Assessment & Intervention   Comments  no further follow up is warranted.  Pt feels she is in a much bettter "head space" than when she started pulmonary rehab       Psychosocial Re-Evaluation: Psychosocial Re-Evaluation    [place] Name 09/06/17 1410 09/06/17 1411 10/04/17 1348 10/25/17 1608 11/23/17 1634     Psychosocial Re-Evaluation   Current issues with  -  None Identified  None Identified  None Identified  None Identified   Comments  -  Pt does not identify any reportable pyschosocial issues.  -  Pt does not identify any reportable pyschosocial issues.  Pt does not identify any reportable pyschosocial issues.   Expected Outcomes  -  Pt demonstrate positive and healthy coping skills.  -  Pt demonstrate positive and healthy coping skills.  Pt demonstrate positive and healthy coping skills.   Interventions  Relaxation education;Stress  management education;Encouraged to attend Pulmonary Rehabilitation for the exercise  -  Encouraged to attend Pulmonary Rehabilitation for the exercise  Encouraged to attend Pulmonary Rehabilitation for the exercise  Encouraged to attend Pulmonary Rehabilitation for the exercise   Continue Psychosocial Services   Follow up required by counselor  -  No Follow up required  No Follow up required  No Follow up required      Psychosocial Discharge (Final Psychosocial Re-Evaluation): Psychosocial Re-Evaluation - 11/23/17 1634      Psychosocial Re-Evaluation   Current issues with  None Identified    Comments  Pt does not identify any reportable pyschosocial issues.    Expected Outcomes  Pt demonstrate positive and healthy coping skills.    Interventions  Encouraged to attend Pulmonary Rehabilitation for the exercise    Continue Psychosocial Services   No Follow up required       Education: Education Goals: Education classes will be provided on a weekly basis, covering required topics. Participant will state understanding/return demonstration of topics presented.  Learning Barriers/Preferences: Learning Barriers/Preferences - 07/30/17 1026      Learning Barriers/Preferences   Learning Barriers  Sight;Hearing   wears glasses  wears hearing aids   Learning Preferences  Written Material;Pictoral;Group Instruction       Education Topics: Risk Factor Reduction:  -Group instruction that is supported by a PowerPoint presentation. Instructor discusses the definition of a risk factor, different risk factors for pulmonary disease, and how the heart and lungs work together.     Nutrition for Pulmonary Patient:  -Group instruction provided by PowerPoint slides, verbal discussion, and written materials to support subject matter. The instructor gives an explanation and review of healthy diet recommendations, which includes a discussion on weight management, recommendations for fruit and vegetable  consumption, as well as protein, fluid, caffeine, fiber, sodium, sugar, and alcohol. Tips for eating when patients are short of breath are discussed.   PULMONARY REHAB CHRONIC OBSTRUCTIVE PULMONARY DISEASE from 11/18/2017 in [place]  Date  11/04/17  Educator  edna  Instruction Review Code  2- Demonstrated Understanding      Pursed Lip Breathing:  -Group instruction that is supported by demonstration and informational handouts. Instructor discusses the benefits of pursed lip and diaphragmatic breathing and detailed demonstration on how to preform both.     Oxygen Safety:  -Group instruction provided by PowerPoint, verbal discussion, and written material to support subject matter. There is an overview of "What is Oxygen" and "Why do we need it".  Instructor also reviews how to create a safe environment for oxygen use, the importance of using oxygen as prescribed, and the risks of noncompliance. There is a brief discussion on traveling with oxygen and resources the patient may utilize.   Oxygen Equipment:  -Group instruction provided by [facility] Staff utilizing handouts, written materials, and equipment demonstrations.   PULMONARY REHAB CHRONIC OBSTRUCTIVE PULMONARY DISEASE from 11/18/2017 in [place]  Date  10/28/17  Educator  Ace Gins  Instruction Review Code  2- Demonstrated Understanding      Signs and Symptoms:  -Group instruction provided by written material and verbal discussion to support subject matter. Warning signs and symptoms of infection, stroke, and heart attack are reviewed and when to call the physician/911 reinforced. Tips for preventing the spread of infection discussed.   PULMONARY REHAB CHRONIC OBSTRUCTIVE PULMONARY DISEASE from 11/18/2017 in [place]  Date  10/14/17  Educator  Remo Lipps  Instruction Review Code  1- Verbalizes Understanding      Advanced Directives:   -Group instruction provided by verbal  instruction and written material to support subject matter. Instructor reviews Advanced Directive laws and proper instruction for filling out document.   Pulmonary Video:  -Group video education that reviews the importance of medication and oxygen compliance, exercise, good nutrition, pulmonary hygiene, and pursed lip and diaphragmatic breathing for the pulmonary patient.   Exercise for the Pulmonary Patient:  -Group instruction that is supported by a PowerPoint presentation. Instructor discusses benefits of exercise, core components of exercise, frequency, duration, and intensity of an exercise routine, importance of utilizing pulse oximetry during exercise, safety while exercising, and options of places to exercise outside of rehab.     PULMONARY REHAB CHRONIC OBSTRUCTIVE PULMONARY DISEASE from 11/18/2017 in [place]  Date  11/04/17  Educator  Cloyde Reams  Instruction Review Code  1- Verbalizes Understanding      Pulmonary Medications:  -Verbally interactive group education provided by instructor with focus on inhaled medications and proper administration.   PULMONARY REHAB CHRONIC OBSTRUCTIVE PULMONARY DISEASE from 11/18/2017 in [place]  Date  09/23/17  Educator  Pharmacy  Instruction Review Code  1- Verbalizes Understanding      Anatomy and Physiology of the Respiratory System and Intimacy:  -Group instruction provided by PowerPoint, verbal discussion, and written material to support subject matter. Instructor reviews respiratory cycle and anatomical components of the respiratory system and their functions. Instructor also reviews differences in obstructive and restrictive respiratory diseases with examples of each. Intimacy, Sex, and Sexuality differences are reviewed with a discussion on how relationships can change when diagnosed with pulmonary disease. Common sexual concerns are  reviewed.   PULMONARY REHAB CHRONIC OBSTRUCTIVE PULMONARY DISEASE from 11/18/2017 in [place]  Date  09/02/17  Educator  rn  Instruction Review Code  2- Demonstrated Understanding      MD DAY -A group question and answer session with a medical doctor that allows participants to ask questions that relate to their pulmonary disease state.   PULMONARY REHAB CHRONIC OBSTRUCTIVE PULMONARY DISEASE from 11/18/2017 in [place]  Date  11/09/17  Educator  Nelda Marseille  Instruction Review Code  2- Demonstrated Understanding      OTHER EDUCATION -Group or individual verbal, written, or video instructions that support the educational goals of the pulmonary rehab program.   PULMONARY REHAB CHRONIC OBSTRUCTIVE PULMONARY DISEASE from 11/18/2017 in [place]  Date  09/16/17  Educator  EP Ernie Hew [place]  Instruction Review Code  2- Demonstrated Understanding      Holiday Eating Survival Tips:  -Group instruction provided by PowerPoint slides, verbal discussion, and written materials to support subject matter. The instructor gives patients tips, tricks, and techniques to help them not only survive but enjoy the holidays despite the onslaught of food that accompanies the holidays.   Knowledge Questionnaire Score: Knowledge Questionnaire Score - 11/24/17 1519      Knowledge Questionnaire Score   Post Score  15/18       Core Components/Risk Factors/Patient Goals at Admission: Personal Goals and Risk Factors at Admission - 07/30/17 1019      Core Components/Risk Factors/Patient Goals on Admission    Weight Management  Weight Loss    Improve shortness of breath with ADL's  Yes    Intervention  Provide education, individualized exercise plan and daily activity instruction to help decrease symptoms of SOB with activities of daily living.    Expected Outcomes  Short Term: Improve cardiorespiratory  fitness to achieve a reduction of symptoms when performing ADLs    Hypertension  Yes    Intervention  Provide education on lifestyle modifcations including regular physical activity/exercise, weight management, moderate sodium restriction and increased consumption of fresh fruit, vegetables, and low fat dairy, alcohol moderation, and smoking cessation.;Monitor prescription use compliance.    Expected Outcomes  Short Term: Continued assessment and intervention until BP is < 140/17m HG in hypertensive participants. < 130/822mHG in hypertensive participants with diabetes, heart failure or chronic kidney disease.;Long Term: Maintenance of blood pressure at goal levels.    Lipids  Yes    Intervention  Provide education and support for participant on nutrition & aerobic/resistive exercise along with prescribed medications to achieve LDL '70mg'$ , HDL >'40mg'$ .    Expected Outcomes  Short Term: Participant states understanding of desired cholesterol values and is compliant with medications prescribed. Participant is following exercise prescription and nutrition guidelines.;Long Term: Cholesterol controlled with medications as prescribed, with individualized exercise RX and with personalized nutrition plan. Value goals: LDL < '70mg'$ , HDL > 40 mg.    Stress  Yes    Intervention  Offer individual and/or small group education and counseling on adjustment to heart disease, stress management and health-related lifestyle change. Teach and support self-help strategies.;Refer participants experiencing significant psychosocial distress to appropriate mental health specialists for further evaluation and treatment. When possible, include family members and significant others in education/counseling sessions.    Expected Outcomes  Short Term: Participant demonstrates changes in health-related behavior, relaxation and other stress management skills, ability to obtain effective social support, and compliance with psychotropic  medications if prescribed.;Long Term: Emotional wellbeing is indicated by absence of clinically significant psychosocial distress or social isolation.    Personal Goal Other  Yes    Personal Goal  Be able travel with short trips with ease and no shortness of breath    Expected Outcomes  [place]  [place]       Core Components/Risk Factors/Patient Goals Review:  Goals and Risk Factor Review    Row Name 09/06/17 1348 10/04/17 1342 10/25/17 1602 10/27/17 1151 11/23/17 1635     Core Components/Risk Factors/Patient Goals Review   Personal Goals Review  Stress;Hypertension;Lipids;Improve shortness of breath with ADL's;Weight Management/Obesity  Weight Management/Obesity;Lipids;Hypertension;Stress  Weight Management/Obesity;Stress  -  Weight Management/Obesity;Stress   Review  Pt is fairly new to pulmonary rehab.  However pt is making progress toward her goals.  Pt weight remains stable with no weight gain noted.  Pt met with RD on 5/31 for discussion of strategies and eating plan.  Pt attending nutrition education class on 5/16.Pt continues to desire to lose weight.  Pt BP remain well within normal limits with the expected increase with activity and the return to pt baseline post exercise - 120's -130's/60-70's.  Pt is compliant with her medications.  pt reports she is using strategies to help with purse lip breathing such as PLB and rest breaks.   Pt is planning to attend her grandson high school [place] Will check back in with pt to see how she managed for the trip.  Pt [place] Will check back in with pt for progress toward the ability to travel to the beach for vacation.  -  Pt has completed 15 exercise sessions.  Pt weight shows increase of 1.3 kg. (however this was post beach trip) Pt continues to  desire to lose weight.  Pt BP remain well within normal limits with the expected increase with activity and the return to pt baseline post exercise - 120's -130's/60-70's.  Pt is compliant with her medications. Will resolve this patient goal. pt reports she is using strategies to help with purse lip breathing such as PLB and rest breaks.  Pt has completed 15 exercise sessions.  Pt weight shows increase of 1.3 kg. (however this was post beach trip) Pt continues to desire to lose weight.  Pt works hard with exercise increase in laps to all time high of 20 laps,  RB level 3 and nustep at 5Pt BP remain well within normal limits with the expected increase with activity and the return to pt baseline post exercise - 120's -130's/60-70's.  Pt is compliant with her medications. Will resolve this patient goal. pt reports she is using strategies to help with purse lip breathing such as PLB and rest breaks.  Pt graduates with the completion of 24 exercise sessions.  Pt weight shows overall  increase of 1.7 kg. Pt continues to desire to lose weight.  Pt reports feeling much better since participating in pulmonary rehab.  RB level 3 and nustep at 5., Pt plans to return to the [facility] for water aerobics.   Expected Outcomes  See "admission expected outcomes"  Is presently on a trip to [place] and I am anxious to see if she was able to enjoy her time and be more active than before she started the program.  Her hypertension , lipids, and stress are controlled.  She has not lost any weight since starting the program.  See Admission Goals  See Admission Goals/Outcomes  -   Row Name 11/24/17 0932             Core Components/Risk Factors/Patient Goals Review   Personal Goals Review  Weight Management/Obesity;Stress;Improve shortness of breath with ADL's;Increase knowledge of respiratory medications and ability to use respiratory devices properly.;Develop more efficient breathing techniques such as purse lipped breathing and  diaphragmatic breathing and practicing self-pacing with activity.          Core Components/Risk Factors/Patient Goals at Discharge (Final Review):  Goals and Risk Factor Review - 11/24/17 0932      Core Components/Risk Factors/Patient Goals Review   Personal Goals Review  Weight Management/Obesity;Stress;Improve shortness of breath with ADL's;Increase knowledge of respiratory medications and ability to use respiratory devices properly.;Develop more efficient breathing techniques such as purse lipped breathing and diaphragmatic breathing and practicing self-pacing with activity.       ITP Comments: ITP Comments    Row Name 07/30/17 1011 09/08/17 1032 10/25/17 1602 11/24/17 0931     ITP Comments  Dr. Jennet Maduro, Medical Director  Dr. Jennet Maduro, Medical Director  Dr. Jennet Maduro, Medical Director  Dr. Jennet Maduro, Medical Director       Comments:  Pt graduates on 8/20 with the completion of 24 exercise sessions. Cherre Huger, BSN Cardiac and Training and development officer

## 2017-11-24 ENCOUNTER — Encounter (HOSPITAL_COMMUNITY): Payer: Self-pay | Admitting: *Deleted

## 2017-11-24 NOTE — Progress Notes (Signed)
[name] 77 y.o. female  120 day Psychosocial Note  Patient psychosocial assessment reveals no barriers to participation in Pulmonary Rehab. Patient graduates and feels she has made progress toward Pulmonary Rehab goals. Patient reports her health and activity level hasimproved in the past 30 days as evidenced by patient's report of increasedability to exercise ambulated 15 laps around the track. Patient states family/friendshavenoticed changes in her activity or mood. Patient reports feeling positive and confident after completing Pulmonary Rehab. Plan of action to help patient continue to be consistent with [facility]  Pt plans to start back with water aerobics and [facility] the [facility] equipment [facility] [employer] [facility]. Her goal was to be able to enjoy family.  Pt feels self confident to get out of the house more and be involved in community activities with her husband. Pt likes being more active than she has been in the past.  Goals  Achieved upon Graduation: Pt has returned to meaningful activities that improve patient's QOL and are attainable with patient's lung disease. Pt with decrease in PHQ2/9 scores. It was truly a delight to have [place] in the pulmonary rehab program.   Karlene Linemanarlette Ronnell Makarewicz RN, BSN Cardiac and Pulmonary Rehab Nurse Navigator

## 2017-11-25 ENCOUNTER — Encounter (HOSPITAL_COMMUNITY): Payer: Medicare Other

## 2017-11-26 NOTE — Progress Notes (Signed)
Discharge Progress Report  Patient Details  Name: [name] MRN: [id number] Date of Birth: 01-09-1941 Referring Provider:     Pulmonary Rehab Walk Test from 08/10/2017 in [place]  Referring Provider  Dr. Melvyn Novas       Number of Visits: 24  Reason for Discharge:  Patient reached a stable level of exercise. Patient independent in their exercise. Patient has met program and personal goals.  Smoking History:  Social History   Tobacco Use  Smoking Status Former Smoker  . Packs/day: 2.00  . Years: 20.00  . Pack years: 40.00  . Types: Cigarettes  . Last attempt to quit: 04/06/1984  . Years since quitting: 33.6  Smokeless Tobacco Never Used    Diagnosis:  No diagnosis found.  ADL [facility]: Pulmonary Assessment Scores    Row Name 08/12/17 0758 11/24/17 1519 11/25/17 0721     ADL [facility]   ADL Phase  Entry  Entry  Exit   SOB Score total  -  64  -     CAT Score   CAT Score  -  23  -     mMRC Score   mMRC Score  2  -  0      Initial Exercise Prescription: Initial Exercise Prescription - 08/12/17 0700      Date of Initial Exercise RX and Referring Provider   Date  08/12/17    Referring Provider  Dr. Melvyn Novas      Recumbant Bike   Level  1    Watts  10    Minutes  17      NuStep   Level  2    SPM  80    Minutes  17    METs  1.5      Track   Laps  5    Minutes  17      Prescription Details   Frequency (times per week)  2    Duration  Progress to 45 minutes of aerobic exercise without signs/symptoms of physical distress      Intensity   THRR 40-80% of Max Heartrate  57-114    Ratings of Perceived Exertion  11-13    Perceived Dyspnea  0-4      Progression   Progression  Continue progressive overload as per policy without signs/symptoms or physical distress.      Resistance Training   Training Prescription  Yes    Weight  orange bands    Reps  10-15       Discharge Exercise Prescription (Final Exercise Prescription  Changes): Exercise Prescription Changes - 11/23/17 1300      Response to Exercise   Blood Pressure (Admit)  108/64    Blood Pressure (Exercise)  118/80    Blood Pressure (Exit)  120/60    Heart Rate (Admit)  60 bpm    Heart Rate (Exercise)  97 bpm    Heart Rate (Exit)  67 bpm    Oxygen Saturation (Admit)  96 %    Oxygen Saturation (Exercise)  94 %    Oxygen Saturation (Exit)  93 %    Rating of Perceived Exertion (Exercise)  14    Perceived Dyspnea (Exercise)  2    Duration  Progress to 45 minutes of aerobic exercise without signs/symptoms of physical distress    Intensity  THRR unchanged      Progression   Progression  Continue to progress workloads to maintain intensity without signs/symptoms of physical distress.  Resistance Training   Training Prescription  Yes    Weight  orange bands    Reps  10-15    Time  10 Minutes      Interval Training   Interval Training  No      Recumbant Bike   Level  3    Watts  10    Minutes  17      NuStep   Level  5    SPM  80    Minutes  17    METs  2.5       Functional Capacity: 6 Minute Walk    Row Name 08/12/17 0737 11/25/17 0720       6 Minute Walk   Phase  Initial  Discharge    Distance  1000 feet  1410 feet    Distance Feet Change  -  410 ft    Walk Time  6 minutes  6 minutes    # of Rest Breaks  0  0    MPH  1.89  2.67    METS  2.45  3.07    RPE  13  11    Perceived Dyspnea   2  1    Symptoms  No  No    Resting HR  59 bpm  61 bpm    Resting BP  120/80  108/64    Resting Oxygen Saturation   95 %  94 %    Exercise Oxygen Saturation  during 6 min walk  90 %  94 %    Max Ex. HR  90 bpm  94 bpm    Max Ex. BP  144/80  148/80    2 Minute Post BP  128/80  -      Interval HR   1 Minute HR  68  75    2 Minute HR  83  85    3 Minute HR  87  86    4 Minute HR  90  93    5 Minute HR  90  92    6 Minute HR  70  94    2 Minute Post HR  63  -    Interval Heart Rate?  Yes  Yes      Interval Oxygen   Interval  Oxygen?  Yes  Yes    Baseline Oxygen Saturation %  95 %  94 %    1 Minute Oxygen Saturation %  94 %  95 %    1 Minute Liters of Oxygen  0 L  0 L    2 Minute Oxygen Saturation %  95 %  94 %    2 Minute Liters of Oxygen  0 L  0 L    3 Minute Oxygen Saturation %  90 %  95 %    3 Minute Liters of Oxygen  0 L  0 L    4 Minute Oxygen Saturation %  90 %  95 %    4 Minute Liters of Oxygen  0 L  0 L    5 Minute Oxygen Saturation %  90 %  94 %    5 Minute Liters of Oxygen  0 L  0 L    6 Minute Oxygen Saturation %  92 %  94 %    6 Minute Liters of Oxygen  0 L  0 L    2 Minute Post Oxygen Saturation %  95 %  -    2  Minute Post Liters of Oxygen  0 L  -       Psychological, QOL, Others - Outcomes: PHQ 2/9: Depression screen [facility] 2/9 11/23/2017 07/30/2017  Decreased Interest 0 1  Down, Depressed, Hopeless 0 1  PHQ - 2 Score 0 2  Altered sleeping 1 1  Tired, decreased energy 1 1  Change in appetite 0 0  Feeling bad or failure about yourself  0 1  Trouble concentrating 0 0  Moving slowly or fidgety/restless 0 0  Suicidal thoughts 0 0  PHQ-9 Score 2 5  Difficult doing work/chores Not difficult at all Not difficult at all    Quality of Life:   Personal Goals: Goals established at orientation with interventions provided to work toward goal. Personal Goals and Risk Factors at Admission - 07/30/17 1019      Core Components/Risk Factors/Patient Goals on Admission    Weight Management  Weight Loss    Improve shortness of breath with ADL's  Yes    Intervention  Provide education, individualized exercise plan and daily activity instruction to help decrease symptoms of SOB with activities of daily living.    Expected Outcomes  Short Term: Improve cardiorespiratory fitness to achieve a reduction of symptoms when performing ADLs    Hypertension  Yes    Intervention  Provide education on lifestyle modifcations including regular physical activity/exercise, weight management, moderate sodium  restriction and increased consumption of fresh fruit, vegetables, and low fat dairy, alcohol moderation, and smoking cessation.;Monitor prescription use compliance.    Expected Outcomes  Short Term: Continued assessment and intervention until BP is < 140/48m HG in hypertensive participants. < 130/840mHG in hypertensive participants with diabetes, heart failure or chronic kidney disease.;Long Term: Maintenance of blood pressure at goal levels.    Lipids  Yes    Intervention  Provide education and support for participant on nutrition & aerobic/resistive exercise along with prescribed medications to achieve LDL '70mg'$ , HDL >'40mg'$ .    Expected Outcomes  Short Term: Participant states understanding of desired cholesterol values and is compliant with medications prescribed. Participant is following exercise prescription and nutrition guidelines.;Long Term: Cholesterol controlled with medications as prescribed, with individualized exercise RX and with personalized nutrition plan. Value goals: LDL < '70mg'$ , HDL > 40 mg.    Stress  Yes    Intervention  Offer individual and/or small group education and counseling on adjustment to heart disease, stress management and health-related lifestyle change. Teach and support self-help strategies.;Refer participants experiencing significant psychosocial distress to appropriate mental health specialists for further evaluation and treatment. When possible, include family members and significant others in education/counseling sessions.    Expected Outcomes  Short Term: Participant demonstrates changes in health-related behavior, relaxation and other stress management skills, ability to obtain effective social support, and compliance with psychotropic medications if prescribed.;Long Term: Emotional wellbeing is indicated by absence of clinically significant psychosocial distress or social isolation.    Personal Goal Other  Yes    Personal Goal  Be able travel with short trips with  ease and no shortness of breath    Expected Outcomes  [place]  [place]        Personal Goals Discharge: Goals and Risk Factor Review    Row Name 09/06/17 1348 10/04/17 1342 10/25/17 1602 10/27/17 1151 11/23/17 1635     Core Components/Risk Factors/Patient Goals  Review   Personal Goals Review  Stress;Hypertension;Lipids;Improve shortness of breath with ADL's;Weight Management/Obesity  Weight Management/Obesity;Lipids;Hypertension;Stress  Weight Management/Obesity;Stress  -  Weight Management/Obesity;Stress   Review  Pt is fairly new to pulmonary rehab.  However pt is making progress toward her goals.  Pt weight remains stable with no weight gain noted.  Pt met with RD on 5/31 for discussion of strategies and eating plan.  Pt attending nutrition education class on 5/16.Pt continues to desire to lose weight.  Pt BP remain well within normal limits with the expected increase with activity and the return to pt baseline post exercise - 120's -130's/60-70's.  Pt is compliant with her medications.  pt reports she is using strategies to help with purse lip breathing such as PLB and rest breaks.   Pt is planning to attend her grandson high school graduation in [place].  Will check back in with pt to see how she managed for the trip.  Pt is planning a vacation later this month to [place].  Will check back in with pt for progress toward the ability to travel to the beach for vacation.  -  Pt has completed 15 exercise sessions.  Pt weight shows increase of 1.3 kg. (however this was post beach trip) Pt continues to desire to lose weight.  Pt BP remain well within normal limits with the expected increase with activity and the return to pt baseline post exercise - 120's -130's/60-70's.  Pt is compliant with her medications. Will resolve this patient goal. pt reports she is  using strategies to help with purse lip breathing such as PLB and rest breaks.  Pt has completed 15 exercise sessions.  Pt weight shows increase of 1.3 kg. (however this was post beach trip) Pt continues to desire to lose weight.  Pt works hard with exercise increase in laps to all time high of 20 laps,  RB level 3 and nustep at 5Pt BP remain well within normal limits with the expected increase with activity and the return to pt baseline post exercise - 120's -130's/60-70's.  Pt is compliant with her medications. Will resolve this patient goal. pt reports she is using strategies to help with purse lip breathing such as PLB and rest breaks.  Pt graduates with the completion of 24 exercise sessions.  Pt weight shows overall  increase of 1.7 kg. Pt continues to desire to lose weight.  Pt reports feeling much better since participating in pulmonary rehab.  RB level 3 and nustep at 5., Pt plans to return to the [facility] for water aerobics.   Expected Outcomes  See "admission expected outcomes"  Is presently on a trip to [place] and I am anxious to see if she was able to enjoy her time and be more active than before she started the program.  Her hypertension , lipids, and stress are controlled.  She has not lost any weight since starting the program.  See Admission Goals  See Admission Goals/Outcomes  -   Row Name 11/24/17 0932             Core Components/Risk Factors/Patient Goals Review   Personal Goals Review  Weight Management/Obesity;Stress;Improve shortness of breath with ADL's;Increase knowledge of respiratory medications and ability to use respiratory devices properly.;Develop more efficient breathing techniques such as purse lipped breathing and diaphragmatic breathing and practicing self-pacing with activity.          Exercise Goals and Review: Exercise Goals    Row Name 07/30/17 1034 07/30/17  1133           Exercise Goals   Increase Physical Activity  Yes  -      Intervention  Provide  advice, education, support and counseling about physical activity/exercise needs.;Develop an individualized exercise prescription for aerobic and resistive training based on initial evaluation findings, risk stratification, comorbidities and participant's personal goals.  -      Expected Outcomes  Short Term: Attend rehab on a regular basis to increase amount of physical activity.;Long Term: Add in home exercise to make exercise part of routine and to increase amount of physical activity.;Long Term: Exercising regularly at least 3-5 days a week.  Short Term: Attend rehab on a regular basis to increase amount of physical activity.;Long Term: Add in home exercise to make exercise part of routine and to increase amount of physical activity.;Long Term: Exercising regularly at least 3-5 days a week.      Increase Strength and Stamina  Yes  -      Intervention  Provide advice, education, support and counseling about physical activity/exercise needs.;Develop an individualized exercise prescription for aerobic and resistive training based on initial evaluation findings, risk stratification, comorbidities and participant's personal goals.  -      Expected Outcomes  Short Term: Increase workloads from initial exercise prescription for resistance, speed, and METs.;Short Term: Perform resistance training exercises routinely during rehab and add in resistance training at home;Long Term: Improve cardiorespiratory fitness, muscular endurance and strength as measured by increased METs and functional capacity (6MWT)  -      Able to understand and use rate of perceived exertion (RPE) scale  Yes  -      Intervention  Provide education and explanation on how to use RPE scale  -      Expected Outcomes  Short Term: Able to use RPE daily in rehab to express subjective intensity level;Long Term:  Able to use RPE to guide intensity level when exercising independently  -      Able to understand and use Dyspnea scale  Yes  -       Intervention  Provide education and explanation on how to use Dyspnea scale  -      Expected Outcomes  Short Term: Able to use Dyspnea scale daily in rehab to express subjective sense of shortness of breath during exertion;Long Term: Able to use Dyspnea scale to guide intensity level when exercising independently  -      Knowledge and understanding of Target Heart Rate Range (THRR)  Yes  -      Intervention  Provide education and explanation of THRR including how the numbers were predicted and where they are located for reference  -      Expected Outcomes  Short Term: Able to state/look up THRR;Long Term: Able to use THRR to govern intensity when exercising independently;Short Term: Able to use daily as guideline for intensity in rehab  -      Understanding of Exercise Prescription  Yes  -      Intervention  Provide education, explanation, and written materials on patient's individual exercise prescription  -      Expected Outcomes  Short Term: Able to explain program exercise prescription;Long Term: Able to explain home exercise prescription to exercise independently  -         Nutrition & Weight - Outcomes:    Nutrition: Nutrition Therapy & Goals - 09/02/17 1234      Nutrition Therapy   Diet  Heart Healthy  Personal Nutrition Goals   Nutrition Goal  Describe the benefit of including fruits, vegetables, whole grains, and low-fat dairy products in a healthy meal plan.      Intervention Plan   Intervention  Prescribe, educate and counsel regarding individualized specific dietary modifications aiming towards targeted core components such as weight, hypertension, lipid management, diabetes, heart failure and other comorbidities.    Expected Outcomes  Short Term Goal: Understand basic principles of dietary content, such as calories, fat, sodium, cholesterol and nutrients.;Long Term Goal: Adherence to prescribed nutrition plan.       Nutrition Discharge: Nutrition Assessments - 11/24/17  1424      Rate Your Plate Scores   Pre Score  55    Post Score  40       Education Questionnaire Score: Knowledge Questionnaire Score - 11/24/17 1519      Knowledge Questionnaire Score   Post Score  15/18       Goals reviewed with patient Pt graduates with the completion of 24 exercise sessions from 08/17/17 -11/23/17. Cherre Huger, BSN Cardiac and Training and development officer

## 2017-11-30 ENCOUNTER — Encounter (HOSPITAL_COMMUNITY): Payer: Medicare Other

## 2017-12-09 ENCOUNTER — Other Ambulatory Visit (INDEPENDENT_AMBULATORY_CARE_PROVIDER_SITE_OTHER): Payer: Medicare Other

## 2017-12-09 ENCOUNTER — Ambulatory Visit (INDEPENDENT_AMBULATORY_CARE_PROVIDER_SITE_OTHER)
Admission: RE | Admit: 2017-12-09 | Discharge: 2017-12-09 | Disposition: A | Discharge status: Home / Self Care | Payer: Medicare Other | Source: Ambulatory Visit | Attending: Nurse Practitioner | Admitting: Nurse Practitioner

## 2017-12-09 ENCOUNTER — Encounter: Payer: Self-pay | Admitting: Nurse Practitioner

## 2017-12-09 ENCOUNTER — Ambulatory Visit (INDEPENDENT_AMBULATORY_CARE_PROVIDER_SITE_OTHER): Payer: Medicare Other | Admitting: Nurse Practitioner

## 2017-12-09 VITALS — BP 132/68 | HR 77 | Ht 64.5 in | Wt 164.2 lb

## 2017-12-09 DIAGNOSIS — R0609 Other forms of dyspnea: Secondary | ICD-10-CM | POA: Diagnosis not present

## 2017-12-09 DIAGNOSIS — J189 Pneumonia, unspecified organism: Secondary | ICD-10-CM

## 2017-12-09 DIAGNOSIS — J449 Chronic obstructive pulmonary disease, unspecified: Secondary | ICD-10-CM

## 2017-12-09 DIAGNOSIS — J181 Lobar pneumonia, unspecified organism: Secondary | ICD-10-CM

## 2017-12-09 LAB — BASIC METABOLIC PANEL
BUN: 10 mg/dL (ref 6–23)
CO2: 37 mEq/L — ABNORMAL HIGH (ref 19–32)
Calcium: 10.3 mg/dL (ref 8.4–10.5)
Chloride: 91 mEq/L — ABNORMAL LOW (ref 96–112)
Creatinine, Ser: 0.65 mg/dL (ref 0.40–1.20)
GFR: 93.65 mL/min (ref >60.00)
Glucose, Bld: 90 mg/dL (ref 70–99)
Potassium: 3.6 mEq/L (ref 3.5–5.1)
Sodium: 134 mEq/L — ABNORMAL LOW (ref 135–145)

## 2017-12-09 LAB — CBC WITH DIFFERENTIAL/PLATELET
Basophils Absolute: 0.1 10*3/uL (ref 0.0–0.1)
Basophils Relative: 0.6 % (ref 0.0–3.0)
Eosinophils Absolute: 0.1 10*3/uL (ref 0.0–0.7)
Eosinophils Relative: 1.1 % (ref 0.0–5.0)
HCT: 36.8 % (ref 36.0–46.0)
Hemoglobin: 12.8 g/dL (ref 12.0–15.0)
Lymphocytes Relative: 11.9 % — ABNORMAL LOW (ref 12.0–46.0)
Lymphs Abs: 1.1 10*3/uL (ref 0.7–4.0)
MCHC: 34.8 g/dL (ref 30.0–36.0)
MCV: 99.5 fl (ref 78.0–100.0)
Monocytes Absolute: 0.6 10*3/uL (ref 0.1–1.0)
Monocytes Relative: 6.7 % (ref 3.0–12.0)
Neutro Abs: 7.5 10*3/uL (ref 1.4–7.7)
Neutrophils Relative %: 79.7 % — ABNORMAL HIGH (ref 43.0–77.0)
Platelets: 469 10*3/uL — ABNORMAL HIGH (ref 150.0–400.0)
RBC: 3.7 Mil/uL — ABNORMAL LOW (ref 3.87–5.11)
RDW: 12.5 % (ref 11.5–15.5)
WBC: 9.4 10*3/uL (ref 4.0–10.5)

## 2017-12-09 MED ORDER — AMOXICILLIN-POT CLAVULANATE 875-125 MG PO TABS: 1.0000 | ORAL_TABLET | Freq: Two times a day (BID) | ORAL | 0 refills | Status: DC

## 2017-12-09 NOTE — Patient Instructions (Addendum)
Will check x ray today will call with results Will check BMP and CBC will call with results Stay well hydrated Continue bevespi Continue current medications Get plenty of rest Keep scheduled follow up with Dr. Sherene Sires on 01/03/18 Call if symptoms worsen

## 2017-12-09 NOTE — Assessment & Plan Note (Signed)
Patient Instructions  Will check x ray today will call with results Will check BMP and CBC will call with results Stay well hydrated Continue bevespi Continue current medications Get plenty of rest Keep scheduled follow up with Dr. Sherene Sires on 01/03/18 Call if symptoms worsen

## 2017-12-09 NOTE — Progress Notes (Signed)
@Patient  ID: [name], female    DOB: 1941/01/04, 77 y.o.   MRN: [id number]  Chief Complaint  Patient presents with  . Follow-up    Trouble breathing since last week. Getting over stomach bug, has not been right since.    Referring provider: Burton Apley, MD  HPI   77 year old female former smoker (quit 1986) with COPD GOLD II who is followed by Dr. Sherene Sires.   Tests: Spirometry technically poor study 05/30/10 with FEV1  1.58 and ratio 69%  - alpha one phenotype 08/07/15 >  MM/ level 146  - Allergy profile 05/14/17  >  Eos 1.1% /  IgE  92 RAST neg - PFT's  06/11/2017  FEV1 1.17 (54 % ) ratio 57  p 11 % improvement from saba p nothing prior to study with DLCO  40/40c % corrects to 57  % for alv volume  - 06/11/2017  After extensive coaching inhaler device  effectiveness =    75% try bevespi  > some better  OV 12/09/17 - Acute shortness of breath and fatigue after stomach virus last week  Patient presents today with shortness of breath and generalized fatigue after recent stomach virus/vomiting/diarrhea. She states that she has gotten over the virus, but shortness of breath has progressively worsened. She denies any fever, chest pain, or edema. She does have a cough, but it is non productive. She is compliant with medications. Taking Bevespbi as directed.      Allergies  Allergen Reactions  . Codeine Nausea And Vomiting  . Imdur [Isosorbide Dinitrate] Nausea Only    Headache Headache  . Other Nausea And Vomiting  . Sulfonamide Derivatives Nausea And Vomiting    Immunization History  Administered Date(s) Administered  . Influenza Whole 03/04/2010, 01/04/2017  . Pneumococcal Polysaccharide-23 05/21/2006  . Td 05/21/2010  . Zoster 03/13/2008    Past Medical History:  Diagnosis Date  . Compression fracture    T9  . COPD (chronic obstructive pulmonary disease) (HCC)   . Coronary artery disease 08/14/2009   a. 08/2009: Ant MI c/b v-fib arrest, s/p PTCA/BMS to LAD. (STENT  Placement); b. 2013 Myoview: no ischemia; c. 11/2012 Cath: patent mLAD stent, RI 70 (stable), otw nonobs dzs, EF 65%;  d. 07/2016 Lexiscan MV: EF 56%, no ischemia/infarct.  Marland Kitchen GERD (gastroesophageal reflux disease)   . HTN (hypertension)   . Hyperlipidemia   . Ischemic cardiomyopathy    a. EF 40-45% in 08/2009 at time of MI. b. Improved to 55-60% by June 2011; c. 03/2016 Echo: EF 55-60%, no rwma, Gr1 DD, mild LVH.  . Osteoarthritis   . Osteopenia   . PONV (postoperative nausea and vomiting)   . Postoperative groin pseudoaneurysm (HCC)    a. 04/2010 following diagnostic cardiac cath, s/p compression.  . Rib fractures    left sided second and sixth rib   . Shingles   . TIA (transient ischemic attack)    a. 03/2016 MRI/MRA negative/unremarkable.  . Ventricular fibrillation (HCC)    a. 08/2009: due to anterior MI.    Tobacco History: Social History   Tobacco Use  Smoking Status Former Smoker  . Packs/day: 2.00  . Years: 20.00  . Pack years: 40.00  . Types: Cigarettes  . Last attempt to quit: 04/06/1984  . Years since quitting: 33.6  Smokeless Tobacco Never Used   Counseling given: Yes   Outpatient Encounter Medications as of 12/09/2017  Medication Sig  . aspirin EC 81 MG tablet Take 1 tablet (81  mg total) daily by mouth.  . calcium carbonate (OS-CAL) 600 MG TABS tablet Take 600 mg by mouth 2 (two) times daily with a meal.   . Cholecalciferol (VITAMIN D3) 1000 units CAPS Take 1,000 Units by mouth daily.  . Glycopyrrolate-Formoterol (BEVESPI AEROSPHERE) 9-4.8 MCG/ACT AERO Inhale 2 puffs into the lungs 2 (two) times daily.  Marland Kitchen loratadine (CLARITIN) 10 MG tablet Take 10 mg by mouth daily as needed.   Marland Kitchen losartan (COZAAR) 50 MG tablet TAKE 1 TABLET (50 MG TOTAL) DAILY BY MOUTH.  . metoprolol succinate (TOPROL-XL) 25 MG 24 hr tablet Take 1 tablet by mouth daily.  . nitroGLYCERIN (NITROSTAT) 0.4 MG SL tablet Place 1 tablet (0.4 mg total) under the tongue every 5 (five) minutes as needed. For  chest pain  . pantoprazole (PROTONIX) 40 MG tablet Take 1 tablet by mouth daily.  . rosuvastatin (CRESTOR) 20 MG tablet Take 1 tablet (20 mg total) by mouth at bedtime.  . traMADol (ULTRAM) 50 MG tablet Take 50 mg by mouth every 6 (six) hours as needed.  . Zoledronic Acid (RECLAST IV) Inject into the vein. Once Yearly  . amoxicillin-clavulanate (AUGMENTIN) 875-125 MG tablet Take 1 tablet by mouth 2 (two) times daily.   No facility-administered encounter medications on file as of 12/09/2017.      Review of Systems  Review of Systems  Constitutional: Positive for fatigue. Negative for chills and fever.  HENT: Negative.   Respiratory: Positive for cough and shortness of breath.   Cardiovascular: Negative.   Gastrointestinal: Negative.   Allergic/Immunologic: Negative.   Neurological: Negative.   Psychiatric/Behavioral: Negative.        Physical Exam  BP 132/68 (BP Location: Left Arm, Patient Position: Sitting, Cuff Size: Normal)   Pulse 77   Ht 5' 4.5" (1.638 m)   Wt 164 lb 3.2 oz (74.5 kg)   SpO2 94%   BMI 27.75 kg/m   Wt Readings from Last 5 Encounters:  12/09/17 164 lb 3.2 oz (74.5 kg)  11/09/17 167 lb 5.3 oz (75.9 kg)  10/26/17 167 lb 15.9 oz (76.2 kg)  10/19/17 168 lb 3.4 oz (76.3 kg)  10/14/17 168 lb 3.4 oz (76.3 kg)     Physical Exam  Constitutional: She is oriented to person, place, and time. She appears well-developed and well-nourished. No distress.  Cardiovascular: Normal rate and regular rhythm.  Pulmonary/Chest: Effort normal and breath sounds normal. No respiratory distress. She has no wheezes. She has no rales.  Neurological: She is alert and oriented to person, place, and time.  Psychiatric: She has a normal mood and affect.  Nursing note and vitals reviewed.    Lab Results:  CBC    Component Value Date/Time   WBC 9.4 12/09/2017 1430   RBC 3.70 (L) 12/09/2017 1430   HGB 12.8 12/09/2017 1430   HCT 36.8 12/09/2017 1430   PLT 469.0 (H)  12/09/2017 1430   MCV 99.5 12/09/2017 1430   MCH 34.0 (H) 05/14/2017 1137   MCHC 34.8 12/09/2017 1430   RDW 12.5 12/09/2017 1430   LYMPHSABS 1.1 12/09/2017 1430   MONOABS 0.6 12/09/2017 1430   EOSABS 0.1 12/09/2017 1430   BASOSABS 0.1 12/09/2017 1430    BMET    Component Value Date/Time   NA 134 (L) 12/09/2017 1430   K 3.6 12/09/2017 1430   CL 91 (L) 12/09/2017 1430   CO2 37 (H) 12/09/2017 1430   GLUCOSE 90 12/09/2017 1430   BUN 10 12/09/2017 1430   CREATININE 0.65 12/09/2017  1430   CREATININE 0.71 07/22/2017 0923   CALCIUM 10.3 12/09/2017 1430   GFRNONAA 82 07/22/2017 0923   GFRAA 95 07/22/2017 0923    BNP No results found for: BNP  ProBNP    Component Value Date/Time   PROBNP 42.0 05/14/2017 0947    Imaging: Dg Chest 2 View  Result Date: 12/09/2017 CLINICAL DATA:  Dyspnea and cough for the past 3 weeks.  Ex-smoker. EXAM: CHEST - 2 VIEW COMPARISON:  05/14/2017. FINDINGS: Borderline enlarged cardiac silhouette. Interval patchy opacity in the left upper lung zone posteriorly. Clear right lung. The interstitial markings remain mildly prominent. Tortuous aorta. Coronary artery stents. Diffuse osteopenia. Stable thoracolumbar spine vertebral compression fractures and kyphoplasty material and degenerative changes. IMPRESSION: 1. Interval superior segment left lower lobe pneumonia. 2. Stable mild chronic interstitial lung disease. 3. Coronary artery stents. Electronically Signed   By: Beckie Salts M.D.   On: 12/09/2017 14:49     Assessment & Plan:   Pneumonia of left lower lobe due to infectious organism (HCC) Chest x ray showed LLL PNA - Augmentin ordered Patient Instructions  Will check x ray today will call with results Will check BMP and CBC will call with results Stay well hydrated Continue bevespi Continue current medications Get plenty of rest Keep scheduled follow up with Dr. Sherene Sires on 01/03/18 Call if symptoms worsen    COPD GOLD II Patient Instructions    Will check x ray today will call with results Will check BMP and CBC will call with results Stay well hydrated Continue bevespi Continue current medications Get plenty of rest Keep scheduled follow up with Dr. Sherene Sires on 01/03/18 Call if symptoms worsen       Ivonne Andrew, NP 12/09/2017

## 2017-12-09 NOTE — Assessment & Plan Note (Signed)
Chest x ray showed LLL PNA - Augmentin ordered Patient Instructions  Will check x ray today will call with results Will check BMP and CBC will call with results Stay well hydrated Continue bevespi Continue current medications Get plenty of rest Keep scheduled follow up with Dr. Sherene Sires on 01/03/18 Call if symptoms worsen

## 2017-12-10 NOTE — Progress Notes (Signed)
Chart and office note reviewed in detail along with available xrays/ labs > agree with a/p as outlined  

## 2017-12-14 ENCOUNTER — Telehealth: Payer: Self-pay | Admitting: Internal Medicine

## 2017-12-14 NOTE — Telephone Encounter (Signed)
Spoke with pt, advised her she should not be contagious after being on the ABX for 5 days. Pt understood and nothing further is needed.

## 2018-01-03 ENCOUNTER — Ambulatory Visit (INDEPENDENT_AMBULATORY_CARE_PROVIDER_SITE_OTHER): Payer: Medicare Other | Admitting: Internal Medicine

## 2018-01-03 ENCOUNTER — Ambulatory Visit (INDEPENDENT_AMBULATORY_CARE_PROVIDER_SITE_OTHER)
Admission: RE | Admit: 2018-01-03 | Discharge: 2018-01-03 | Disposition: A | Discharge status: Home / Self Care | Payer: Medicare Other | Source: Ambulatory Visit | Attending: Internal Medicine | Admitting: Internal Medicine

## 2018-01-03 ENCOUNTER — Encounter: Payer: Self-pay | Admitting: Internal Medicine

## 2018-01-03 VITALS — BP 122/80 | HR 60 | Ht 65.0 in | Wt 165.0 lb

## 2018-01-03 DIAGNOSIS — J449 Chronic obstructive pulmonary disease, unspecified: Secondary | ICD-10-CM

## 2018-01-03 DIAGNOSIS — J181 Lobar pneumonia, unspecified organism: Secondary | ICD-10-CM

## 2018-01-03 DIAGNOSIS — I2510 Atherosclerotic heart disease of native coronary artery without angina pectoris: Secondary | ICD-10-CM | POA: Diagnosis not present

## 2018-01-03 DIAGNOSIS — J189 Pneumonia, unspecified organism: Secondary | ICD-10-CM

## 2018-01-03 NOTE — Patient Instructions (Addendum)
Ok to use clariton or zyrtec over the counter  as needed for drainage   No change with bevespi  - if not happy bring back you formulary and we will picky you an alternative    Please remember to go to the  x-ray department downstairs in the basement  for your tests - we will call you with the results when they are available.      Please schedule a follow up visit in 6 months but call sooner if needed Add:  Change f/u to 4 week with cxr

## 2018-01-03 NOTE — Assessment & Plan Note (Signed)
Dx 12/09/17 > rx augmentin 875 bid x 7 days > improved 01/03/2018 needs f/u cxr in 4 weeks   Discussed in detail all the  indications, usual  risks and alternatives  relative to the benefits with patient who agrees to proceed with conservative f/u as outlined  = 4 weeks    I had an extended discussion with the patient reviewing all relevant studies completed to date and  lasting 15 to 20 minutes of a 25 minute visit    See device teaching which extended face to face time for this visit.  Each maintenance medication was reviewed in detail including emphasizing most importantly the difference between maintenance and prns and under what circumstances the prns are to be triggered using an action plan format that is not reflected in the computer generated alphabetically organized AVS which I have not found useful in most complex patients, especially with respiratory illnesses  Please see AVS for specific instructions unique to this visit that I personally wrote and verbalized to the the pt in detail and then reviewed with pt  by my nurse highlighting any  changes in therapy recommended at today's visit to their plan of care.

## 2018-01-03 NOTE — Progress Notes (Signed)
Subjective:    Patient ID: [name], female    DOB: [birth year]     MRN: [id number]    Brief patient profile:  64 yowf   MM  quit smoking in 1986 with am throat congestion and felt fine until around 2012 with onset of doe so referred to pulmonary clinic 08/07/2015 by Dr Elmore Guise with GOLD II criteria  06/11/2017     History of Present Illness  08/07/2015 1st [facility] office visit/ Arnola Crittendon   Chief Complaint  Patient presents with  . Pulmonary Consult    Referred by Dr. [name]. Pt c/o SOB x 5 yrs- worse x 4 months. She is SOB with grocery shopping or lifting things. She also c/o occ PND and am cough- yellow to clear sputum.   indolent onset doe x 5 years  To point where has trouble carrying groceries from car to house and up x 3 steps and no real change on dulera or symbicort to date - thought advair workded better. Breathing Ok at hs and in am Does fine if rests or paces  Problems with doe also while vacuuming for more than 5 min- note Sometimes gags and heaves during coughing fits she attributes to pnds , esp in am but otherwise no excess/ purulent sputum or mucus plugs   RECS Add pepcid 20 mg at bedtime to the protonix before bfast Stop symbicort but if breathing worse restart except try no to take it am of test  GERD  Diet   Please schedule a follow up office visit in 6 weeks, call sooner if needed with pfts ok to push back a few weeks if needed > did not return as req     05/14/2017 acute extended ov/Michon Kaczmarek re: sob/ cough full  pfts never done  Chief Complaint  Patient presents with  . Acute Visit    Increased SOB since end of Dec 2018. She gets SOB with exertion such as carrying her groceries in. She also c/o PND and occ cough with clear sputum.    prior to end Dec 2018 since last visit on no inhalers / on ppi before bfast s h2 hs > cough not better but able to do water aerobics twice weekly x one hour  Gagging and heaving / worse with brush teeth  since end of dec grad  worse Doe to point of = MMRC3 = can't walk 100 yards even at a slow pace at a flat grade s stopping due to sob  And reported "wheezing" with exertion (niether symptom reproduced on today's walk)  Sleeps flat ok rec Add pepcid 20 mg at bedtime GERD diet    06/11/2017  f/u ov/Helane Briceno re: GOLD II COPD/   cough better  Chief Complaint  Patient presents with  . Follow-up    PFT's today, dyspnea on exertion   Dyspnea:    MMRC2 = can't walk a nl pace on a flat grade s sob but does fine slow and flat eg shopping at [employer] leaning on cart  Cough: ?  Little better  Sleep: ok  SABA use: none  rec Bevespi Take 2 puffs first thing in am and then another 2 puffs about 12 hours later.  Work on inhaler technique   07/01/2017  f/u ov/Elmin Wiederholt re: GOLD II copd/ maint bevespi Chief Complaint  Patient presents with  . Follow-up    Breathing has improved some. She has no new co's. She does not have a recue inhaler.   Dyspnea:  MMRC2 = can't walk a nl pace on a flat grade s sob but does fine slow and flat   Cough: no problem Sleep: ok flate  SABA use:  None rec Please see patient coordinator before you leave today  to schedule refer to rehab> graduated mid August 2019     NP 12/09/17  Acute ov   Will check BMP and CBC will call with results Stay well hydrated Continue bevespi Continue current medications Get plenty of rest cxr c/w ? pna > augmentin x 7 days    01/03/2018  f/u ov/Markelle Asaro re: copd GOLD II Chief Complaint  Patient presents with  . Follow-up    Breathing has improved back to her normal baseline. She still has some cough with clear sputum.    Dyspnea:  MMRC2 = can't walk a nl pace on a flat grade s sob but does fine slow and flat   Cough: not much, am's only s truly excess/ purulent sputum or mucus plugs   Sleeping: flat bed, one pillow SABA use: none 02: none      No obvious day to day or daytime variability or assoc   hemoptysis or cp or chest tightness, subjective wheeze or overt sinus  or hb symptoms.   Sleeping as above without nocturnal  or early am exacerbation  of respiratory  c/o's or need for noct saba. Also denies any obvious fluctuation of symptoms with weather or environmental changes or other aggravating or alleviating factors except as outlined above   No unusual exposure hx or h/o childhood pna/ asthma or knowledge of premature birth.  Current Allergies, Complete Past Medical History, Past Surgical History, Family History, and Social History were reviewed in [employer] record.  ROS  The following are not active complaints unless bolded Hoarseness, sore throat, dysphagia, dental problems, itching, sneezing,  nasal congestion or discharge of excess mucus or purulent secretions, ear ache,   fever, chills, sweats, unintended wt loss or wt gain, classically pleuritic or exertional cp,  orthopnea pnd or arm/hand swelling  or leg swelling, presyncope, palpitations, abdominal pain, anorexia, nausea, vomiting, diarrhea  or change in bowel habits or change in bladder habits, change in stools or change in urine, dysuria, hematuria,  rash, arthralgias, visual complaints, headache, numbness, weakness or ataxia or problems with walking or coordination,  change in mood or  memory.        Current Meds  Medication Sig  . aspirin EC 81 MG tablet Take 1 tablet (81 mg total) daily by mouth.  . calcium carbonate (OS-CAL) 600 MG TABS tablet Take 600 mg by mouth 2 (two) times daily with a meal.   . Cholecalciferol (VITAMIN D3) 1000 units CAPS Take 1,000 Units by mouth daily.  . Glycopyrrolate-Formoterol (BEVESPI AEROSPHERE) 9-4.8 MCG/ACT AERO Inhale 2 puffs into the lungs 2 (two) times daily.  Marland Kitchen loratadine (CLARITIN) 10 MG tablet Take 10 mg by mouth daily as needed.   Marland Kitchen losartan (COZAAR) 50 MG tablet TAKE 1 TABLET (50 MG TOTAL) DAILY BY MOUTH.  . metoprolol succinate (TOPROL-XL) 25 MG 24 hr tablet Take 1 tablet by mouth daily.  . nitroGLYCERIN (NITROSTAT) 0.4 MG  SL tablet Place 1 tablet (0.4 mg total) under the tongue every 5 (five) minutes as needed. For chest pain  . pantoprazole (PROTONIX) 40 MG tablet Take 1 tablet by mouth daily.  . rosuvastatin (CRESTOR) 20 MG tablet Take 1 tablet (20 mg total) by mouth at bedtime.  . traMADol (ULTRAM) 50 MG tablet  Take 50 mg by mouth every 6 (six) hours as needed.  . Zoledronic Acid (RECLAST IV) Inject into the vein. Once Yearly                    Objective:   Physical Exam  amb wf nad   01/03/2018       165  07/01/2017       167  06/11/2017         165  05/14/2017         163   08/07/15 164 lb (74.39 kg)  06/11/15 160 lb (72.576 kg)  05/09/15 160 lb 3 oz (72.661 kg)      Vital signs reviewed - Note on arrival 02 sats  96% on RA            HEENT: nl  oropharynx. Nl external ear canals without cough reflex -  Mild bilateral non-specific turbinate edema and top dentures     NECK :  without JVD/Nodes/TM/ nl carotid upstrokes bilaterally   LUNGS: no acc muscle use,  Mod kyphotic/ barrel  contour chest wall with bilateral  Distant bs s audible wheeze and  without cough on insp or exp maneuver and mild  Hyperresonant  to  percussion bilaterally     CV:  RRR  no s3 or murmur or increase in P2, and no edema   ABD:  soft and nontender with pos mid/late insp Hoover's  in the supine position. No bruits or organomegaly appreciated, bowel sounds nl  MS:   Nl gait/  ext warm without deformities, calf tenderness, cyanosis or clubbing No obvious joint restrictions   SKIN: warm and dry without lesions    NEURO:  alert, approp, nl sensorium with  no motor or cerebellar deficits apparent.        CXR PA and Lateral:   01/03/2018 :    I personally reviewed images and agree with radiology impression as follows:    Persistent abnormal increased density in the superior segment of the left lower lobe. There is been minimal interval improvement. There is overall increase in the interstitial markings of both  lungs which could reflect superimposed acute bronchitis. However, chest CT scanning is recommended in an effort to exclude developing malignancy in the left lower lobe. - my impression: now more of a linear atx pattern, not mass like c/w resolving pna          Assessment & Plan:

## 2018-01-03 NOTE — Assessment & Plan Note (Signed)
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 >  MM/ level 146  - 05/14/2017  Walked RA x 3 laps @ 185 ft each stopped due to  End of study, nl pace, no   desat - mild sob p 2nd lap   - Allergy profile 05/14/17  >  Eos 1.1% /  IgE  92 RAST neg - PFT's  06/11/2017  FEV1 1.17 (54 % ) ratio 57  p 11 % improvement from saba p nothing prior to study with DLCO  40/40c % corrects to 57  % for alv volume  - 06/11/2017  After extensive coaching inhaler device  effectiveness =    75% try bevespi  > some better - 07/01/2017 refer to rehab/ f/u q 6 m  - The proper method of use, as well as anticipated side effects, of a metered-dose inhaler are discussed and demonstrated to the patient.    Pt is Group B in terms of symptom/risk and laba/lama therefore appropriate rx at this point > continue bevespi 2 bid

## 2018-01-04 NOTE — Progress Notes (Signed)
Office Visit Note  Patient: [name]             Date of Birth: 1940-10-18           MRN: [id number]             PCP: Burton Apley, MD Referring: Burton Apley, MD Visit Date: 01/18/2018 Occupation: @GUAROCC @  Subjective:  Back pain   History of Present Illness: [name] is a 77 y.o. female with history of osteoporosis, osteoarthritis, and DDD. She receives Reclast infusions once yearly. Per patient her last infusion was in April 2019.  She reports that she continues to notice worsening kyphosis.  She states that she feels as though the kyphosis has been affecting her breathing.  She continues to follow-up with Dr. Sherene Sires for COPD.  She continues to take calcium and vitamin D on a daily basis.  She reports that her lower back has been doing well.  She denies any radiation or numbness.  She denies any recent falls.  She reports that she went to pulmonary rehab class back in August and noticed significant improvement.  She states she has occasional discomfort in bilateral hands and has some difficulty opening jars and buttons.  She denies any other joint pain or joint swelling at this time.    Activities of Daily Living:  Patient reports morning stiffness for 5 minutes.   Patient Reports nocturnal pain.  Difficulty dressing/grooming: Denies Difficulty climbing stairs: Denies Difficulty getting out of chair: Denies Difficulty using hands for taps, buttons, cutlery, and/or writing: Reports  Review of Systems  Constitutional: Negative for fatigue.  HENT: Negative for mouth sores, trouble swallowing, trouble swallowing, mouth dryness and nose dryness.   Eyes: Negative for pain, redness, visual disturbance and dryness.  Respiratory: Positive for difficulty breathing. Negative for cough, hemoptysis and shortness of breath.        Due to COPD   Cardiovascular: Negative for chest pain, palpitations, hypertension and swelling in legs/feet.  Gastrointestinal: Negative for abdominal  pain, blood in stool, constipation, diarrhea, nausea and vomiting.  Endocrine: Negative for increased urination.  Genitourinary: Negative for painful urination, nocturia and pelvic pain.  Musculoskeletal: Positive for arthralgias, joint pain, joint swelling and morning stiffness. Negative for myalgias, muscle weakness, muscle tenderness and myalgias.  Skin: Negative for color change, pallor, rash, hair loss, nodules/bumps, skin tightness, ulcers and sensitivity to sunlight.  Allergic/Immunologic: Negative for susceptible to infections.  Neurological: Negative for dizziness, light-headedness, numbness, headaches, memory loss and weakness.  Hematological: Negative for swollen glands.  Psychiatric/Behavioral: Negative for depressed mood, confusion and sleep disturbance. The patient is not nervous/anxious.     PMFS History:  Patient Active Problem List   Diagnosis Date Noted  . CAP (community acquired pneumonia) 01/03/2018  . Pneumonia of left lower lobe due to infectious organism (HCC) 12/09/2017  . TIA (transient ischemic attack) 03/18/2016  . Right sided weakness   . Stroke (HCC) 03/16/2016  . Stroke-like symptoms   . History of placement of stent in LAD coronary artery   . Dyspnea on exertion 08/08/2015  . Preoperative clearance 05/09/2015  . Atypical chest pain 03/21/2015  . Acute UTI 03/21/2015  . Hydronephrosis, right 03/21/2015  . Proteinuria 03/21/2015  . HTN (hypertension) 03/21/2015  . Chronic diastolic heart failure (HCC) 03/21/2015  . 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 Unstable angina (HCC) 11/16/2012  . Palpitations 07/22/2010  . COPD GOLD II 05/30/2010  . Hyperlipidemia 08/27/2009  . AMI 08/27/2009  . CAD (  coronary artery disease) 08/27/2009  . Ventricular fibrillation (HCC) 08/14/2009  . COMPRESSION FRACTURE, LUMBAR VERTEBRAE 12/21/2008  . SPINAL STENOSIS, LUMBAR 08/31/2008  . Macrocytic anemia 08/30/2008  . ABDOMINAL MASS 07/11/2008  .  Shortness of breath 07/10/2008  . UNSPECIFIED VITAMIN D DEFICIENCY 03/09/2008  . GERD 03/09/2008  . OSTEOARTHRITIS 11/21/2007  . OSTEOPENIA 11/21/2007    Past Medical History:  Diagnosis Date  . Compression fracture    T9  . COPD (chronic obstructive pulmonary disease) (HCC)   . Coronary artery disease 08/14/2009   a. 08/2009: Ant MI c/b v-fib arrest, s/p PTCA/BMS to LAD. (STENT Placement); b. 2013 Myoview: no ischemia; c. 11/2012 Cath: patent mLAD stent, RI 70 (stable), otw nonobs dzs, EF 65%;  d. 07/2016 Lexiscan MV: EF 56%, no ischemia/infarct.  Marland Kitchen GERD (gastroesophageal reflux disease)   . HTN (hypertension)   . Hyperlipidemia   . Ischemic cardiomyopathy    a. EF 40-45% in 08/2009 at time of MI. b. Improved to 55-60% by June 2011; c. 03/2016 Echo: EF 55-60%, no rwma, Gr1 DD, mild LVH.  . Osteoarthritis   . Osteopenia   . PONV (postoperative nausea and vomiting)   . Postoperative groin pseudoaneurysm    a. 04/2010 following diagnostic cardiac cath, s/p compression.  . Rib fractures    left sided second and sixth rib   . Shingles   . TIA (transient ischemic attack)    a. 03/2016 MRI/MRA negative/unremarkable.  . Ventricular fibrillation (HCC)    a. 08/2009: due to anterior MI.    Family History  Problem Relation Age of Onset  . Rheum arthritis Sister    Past Surgical History:  Procedure Laterality Date  . APPENDECTOMY    . BACK SURGERY    . LEFT HEART CATHETERIZATION WITH CORONARY ANGIOGRAM N/A 11/16/2012   Procedure: LEFT HEART CATHETERIZATION WITH CORONARY ANGIOGRAM;  Surgeon: Kathleene Hazel, MD;  Location: [facility] CATH LAB;  Service: Cardiovascular;  Laterality: N/A;  . LEG SURGERY     R low  . ORIF HIP FRACTURE    . TONSILLECTOMY     Social History   Social History Narrative  . Not on file    Objective: Vital Signs: BP 120/73 (BP Location: Left Arm, Patient Position: Sitting, Cuff Size: Normal)   Pulse (!) 57   Resp 14   Ht 5\' 5"  (1.651 m)   Wt 170 lb 9.6 oz  (77.4 kg)   BMI 28.39 kg/m    Physical Exam  Constitutional: She is oriented to person, place, and time. She appears well-developed and well-nourished.  HENT:  Head: Normocephalic and atraumatic.  Eyes: Conjunctivae and EOM are normal.  Neck: Normal range of motion.  Cardiovascular: Normal rate, regular rhythm, normal heart sounds and intact distal pulses.  Pulmonary/Chest: Effort normal and breath sounds normal.  Abdominal: Soft. Bowel sounds are normal.  Lymphadenopathy:    She has no cervical adenopathy.  Neurological: She is alert and oriented to person, place, and time.  Skin: Skin is warm and dry. Capillary refill takes less than 2 seconds.  Psychiatric: She has a normal mood and affect. Her behavior is normal.  Nursing note and vitals reviewed.    Musculoskeletal Exam: C-spine good range of motion.  Thoracic kyphosis noted.  She has limited range of motion of thoracic and lumbar spine.  No midline spinal tenderness.  No SI joint tenderness.  Shoulder joints, elbow joints, wrist joints, MCPs and PIPs, DIPs good range of motion no synovitis.  She has PIP and  DIP synovial thickening consistent with osteoarthritis of bilateral hands.  She has bilateral CMC joint synovial thickening.  She has complete fist formation bilaterally.  Hip joints, knee joints, ankle joints, MTPs, PIPs, DIPs good range of motion no synovitis.  She has PIP and DIP synovial thickening consistent with osteoarthritis of bilateral feet.  No warmth or effusion bilateral knee joints.  No tenderness of trochanter bursa bilaterally.  No Achilles tendinitis or plantar fasciitis.  CDAI Exam: CDAI Score: Not documented Patient Global Assessment: Not documented; Provider Global Assessment: Not documented Swollen: Not documented; Tender: Not documented Joint Exam   Not documented   There is currently no information documented on the homunculus. Go to the Rheumatology activity and complete the homunculus joint  exam.  Investigation: No additional findings.  Imaging: Dg Chest 2 View  Result Date: 01/03/2018 CLINICAL DATA:  Follow-up recent episode of pneumonia. Persistent mild congestion. History of COPD, coronary artery disease, former smoker. EXAM: CHEST - 2 VIEW COMPARISON:  PA and lateral chest x-ray of September 5th 2019 and May 14, 2017 FINDINGS: There has been partial clearing of the density in the left perihilar region. Density in the left suprahilar region has also slightly decreased in conspicuity. The interstitial markings of both lungs are coarse and overall slightly more conspicuous today. There is no pleural effusion. The heart and pulmonary vascularity are normal. Coronary artery calcifications are visible. There is calcification in the wall of the aortic arch and tortuosity in the course of the aorta. There is prominent thoracic kyphosis. The patient has undergone kyphoplasty at T12 or L1. IMPRESSION: Persistent abnormal increased density in the superior segment of the left lower lobe. There is been minimal interval improvement. There is overall increase in the interstitial markings of both lungs which could reflect superimposed acute bronchitis. However, chest CT scanning is recommended in an effort to exclude developing malignancy in the left lower lobe. Coronary artery and thoracic aortic calcifications. Electronically Signed   By: David  [place] M.D.   On: 01/03/2018 16:13   Mr Brain Wo Contrast  Result Date: 01/10/2018 CLINICAL DATA:  Stroke.  Right-sided weakness. EXAM: MRI HEAD WITHOUT CONTRAST TECHNIQUE: Multiplanar, multiecho pulse sequences of the brain and surrounding structures were obtained without intravenous contrast. COMPARISON:  CT head 01/10/2018 FINDINGS: Brain: Negative for acute infarct. Chronic microvascular ischemic change in the white matter and pons of a moderate degree. Multiple areas of chronic microhemorrhage in the brain. No mass or fluid collection. No midline  shift. Vascular: Normal arterial flow voids Skull and upper cervical spine: Negative Sinuses/Orbits: Mild mucosal edema paranasal sinuses. Bilateral mastoid effusion. Bilateral cataract surgery Other: None IMPRESSION: Negative for acute infarct. Moderate atrophy and chronic microvascular ischemia. Multiple areas of chronic microhemorrhage likely due to hypertension. Electronically Signed   By: Marlan Palau M.D.   On: 01/10/2018 15:00   Ct Head Code Stroke Wo Contrast  Result Date: 01/10/2018 CLINICAL DATA:  Code stroke. Code stroke. Right-sided weakness. Left facial droop. EXAM: CT HEAD WITHOUT CONTRAST TECHNIQUE: Contiguous axial images were obtained from the base of the skull through the vertex without intravenous contrast. COMPARISON:  MRI brain 03/16/2016. CT head without contrast 03/16/2016. FINDINGS: Brain: Advanced white matter disease bilaterally is similar the prior exam. No acute intracranial abnormality is present. Remote ischemic changes are again noted in the thalami bilaterally. Generalized atrophy is unchanged. No acute infarct, hemorrhage, or mass lesion is present. The brainstem and cerebellum are normal. Basal ganglia calcifications are again noted. Vascular: Atherosclerotic calcifications are present  within the cavernous internal carotid arteries bilaterally. There is no hyperdense vessel. Skull: Calvarium is intact. No focal lytic or blastic lesions are present. Sinuses/Orbits: The paranasal sinuses and left mastoid air cells are clear. Fluid is present in the right mastoid air cells with some sclerosis, suggesting chronic disease. No obstructing nasopharyngeal lesion is evident. ASPECTS [facility] Stroke Program Early CT Score) - Ganglionic level infarction (caudate, lentiform nuclei, internal capsule, insula, M1-M3 cortex): 7/7 - Supraganglionic infarction (M4-M6 cortex): 3/3 Total score (0-10 with 10 being normal): 10/10 IMPRESSION: 1. Stable advanced atrophy and white matter disease. This  likely reflects the sequela of chronic microvascular ischemia. 2. Atherosclerosis. 3. No acute intracranial abnormality. 4. ASPECTS is 10/10 The above was relayed via text pager to Dr. Wilford Corner on 01/10/2018 at 12:45 . Electronically Signed   By: Marin Roberts M.D.   On: 01/10/2018 12:45    Recent Labs: Lab Results  Component Value Date   WBC 7.6 01/10/2018   HGB 12.2 01/10/2018   PLT 238 01/10/2018   NA 135 01/10/2018   K 4.3 01/10/2018   CL 99 01/10/2018   CO2 24 01/10/2018   GLUCOSE 76 01/10/2018   BUN 10 01/10/2018   CREATININE 0.70 01/10/2018   BILITOT 0.8 01/10/2018   ALKPHOS 51 01/10/2018   AST 26 01/10/2018   ALT 17 01/10/2018   PROT 6.1 (L) 01/10/2018   ALBUMIN 3.4 (L) 01/10/2018   CALCIUM 8.5 (L) 01/10/2018   GFRAA >60 01/10/2018    Speciality Comments: No specialty comments available.  Procedures:  No procedures performed Allergies: Codeine; Imdur [isosorbide dinitrate]; Other; and Sulfonamide derivatives   Assessment / Plan:     Visit Diagnoses: Age-related osteoporosis without current pathological fracture -  tx w/ Forteo 2012-2014-hx of vertebral fx, height loss of 4 inches, thoracic kyphosis.T-score -4.2 03/02/17 DEXA.  She restarted on reclast in April 2019.  She continues to take calcium and vitamin D supplements on a daily basis.  She has not had any recent falls.  She has no midline spinal tenderness.  She has significant thoracic kyphosis.  We discussed back exercises that she can perform at home.  She requested a referral to physical therapy.  DDD (degenerative disc disease), lumbar - s/p fusion: Limited range of motion.  She has no discomfort at this time.  No midline spinal tenderness.  Primary osteoarthritis of both hands: She has PIP and DIP synovial thickening consistent with osteoarthritis of bilateral hands.  She has bilateral CMC joint synovial thickening.  She is complete fist formation bilaterally.  No synovitis was noted.  Joint protection and  muscle strengthening were discussed.  Primary osteoarthritis of right knee: No warmth or effusion.  She has good range of motion with no discomfort.  She has right knee crepitus on exam.  She has occasional discomfort in her right knee joint.  History of COPD: She follows up with Dr. Sherene Sires.  She went to pulmonary rehab in August.  Other medical conditions are listed as follows:  History of gastroesophageal reflux (GERD)  History of placement of stent in LAD coronary artery  History of hyperlipidemia   Orders: Orders Placed This Encounter  Procedures  . Ambulatory referral to Physical Therapy   No orders of the defined types were placed in this encounter.   Face-to-face time spent with patient was 30 minutes. Greater than 50% of time was spent in counseling and coordination of care.  Follow-Up Instructions: Return in about 6 months (around 07/20/2018) for Osteoporosis, Osteoarthritis, DDD.  Ofilia Neas, PA-C  Note - This record has been created using Dragon software.  Chart creation errors have been sought, but may not always  have been located. Such creation errors do not reflect on  the standard of medical care.

## 2018-01-10 ENCOUNTER — Other Ambulatory Visit: Payer: Self-pay

## 2018-01-10 ENCOUNTER — Emergency Department (HOSPITAL_COMMUNITY)
Admission: EM | Admit: 2018-01-10 | Discharge: 2018-01-10 | Disposition: A | Discharge status: Home / Self Care | Payer: Medicare Other | Attending: Emergency Medicine | Admitting: Emergency Medicine

## 2018-01-10 ENCOUNTER — Encounter (HOSPITAL_COMMUNITY): Payer: Self-pay

## 2018-01-10 ENCOUNTER — Emergency Department (HOSPITAL_COMMUNITY): Payer: Medicare Other

## 2018-01-10 DIAGNOSIS — R531 Weakness: Secondary | ICD-10-CM | POA: Insufficient documentation

## 2018-01-10 DIAGNOSIS — I5032 Chronic diastolic (congestive) heart failure: Secondary | ICD-10-CM | POA: Diagnosis not present

## 2018-01-10 DIAGNOSIS — I2510 Atherosclerotic heart disease of native coronary artery without angina pectoris: Secondary | ICD-10-CM | POA: Diagnosis not present

## 2018-01-10 DIAGNOSIS — I255 Ischemic cardiomyopathy: Secondary | ICD-10-CM | POA: Diagnosis not present

## 2018-01-10 DIAGNOSIS — I110 Hypertensive heart disease with heart failure: Secondary | ICD-10-CM | POA: Diagnosis not present

## 2018-01-10 DIAGNOSIS — R2990 Unspecified symptoms and signs involving the nervous system: Secondary | ICD-10-CM

## 2018-01-10 DIAGNOSIS — R29810 Facial weakness: Secondary | ICD-10-CM | POA: Insufficient documentation

## 2018-01-10 DIAGNOSIS — J449 Chronic obstructive pulmonary disease, unspecified: Secondary | ICD-10-CM | POA: Insufficient documentation

## 2018-01-10 DIAGNOSIS — Z87891 Personal history of nicotine dependence: Secondary | ICD-10-CM | POA: Insufficient documentation

## 2018-01-10 LAB — COMPREHENSIVE METABOLIC PANEL
ALT: 17 U/L (ref 0–44)
AST: 26 U/L (ref 15–41)
Albumin: 3.4 g/dL — ABNORMAL LOW (ref 3.5–5.0)
Alkaline Phosphatase: 51 U/L (ref 38–126)
Anion gap: 8 (ref 5–15)
BUN: 10 mg/dL (ref 8–23)
CO2: 24 mmol/L (ref 22–32)
Calcium: 8.5 mg/dL — ABNORMAL LOW (ref 8.9–10.3)
Chloride: 102 mmol/L (ref 98–111)
Creatinine, Ser: 0.81 mg/dL (ref 0.44–1.00)
GFR calc Af Amer: >60 mL/min (ref >60)
GFR calc non Af Amer: >60 mL/min (ref >60)
Glucose, Bld: 78 mg/dL (ref 70–99)
Potassium: 4.4 mmol/L (ref 3.5–5.1)
Sodium: 134 mmol/L — ABNORMAL LOW (ref 135–145)
Total Bilirubin: 0.8 mg/dL (ref 0.3–1.2)
Total Protein: 6.1 g/dL — ABNORMAL LOW (ref 6.5–8.1)

## 2018-01-10 LAB — DIFFERENTIAL
Abs Immature Granulocytes: 0 10*3/uL (ref 0.0–0.1)
Basophils Absolute: 0 10*3/uL (ref 0.0–0.1)
Basophils Relative: 0 %
Eosinophils Absolute: 0.1 10*3/uL (ref 0.0–0.7)
Eosinophils Relative: 1 %
Immature Granulocytes: 0 %
Lymphocytes Relative: 19 %
Lymphs Abs: 1.4 10*3/uL (ref 0.7–4.0)
Monocytes Absolute: 0.7 10*3/uL (ref 0.1–1.0)
Monocytes Relative: 9 %
Neutro Abs: 5.3 10*3/uL (ref 1.7–7.7)
Neutrophils Relative %: 71 %

## 2018-01-10 LAB — I-STAT CHEM 8, ED
BUN: 10 mg/dL (ref 8–23)
Calcium, Ion: 1.07 mmol/L — ABNORMAL LOW (ref 1.15–1.40)
Chloride: 99 mmol/L (ref 98–111)
Creatinine, Ser: 0.7 mg/dL (ref 0.44–1.00)
Glucose, Bld: 76 mg/dL (ref 70–99)
HCT: 36 % (ref 36.0–46.0)
Hemoglobin: 12.2 g/dL (ref 12.0–15.0)
Potassium: 4.3 mmol/L (ref 3.5–5.1)
Sodium: 135 mmol/L (ref 135–145)
TCO2: 28 mmol/L (ref 22–32)

## 2018-01-10 LAB — CBC
HCT: 37.6 % (ref 36.0–46.0)
Hemoglobin: 12.4 g/dL (ref 12.0–15.0)
MCH: 34 pg (ref 26.0–34.0)
MCHC: 33 g/dL (ref 30.0–36.0)
MCV: 103 fL — ABNORMAL HIGH (ref 78.0–100.0)
Platelets: 238 10*3/uL (ref 150–400)
RBC: 3.65 MIL/uL — ABNORMAL LOW (ref 3.87–5.11)
RDW: 12.7 % (ref 11.5–15.5)
WBC: 7.6 10*3/uL (ref 4.0–10.5)

## 2018-01-10 LAB — I-STAT TROPONIN, ED: Troponin i, poc: 0 ng/mL (ref 0.00–0.08)

## 2018-01-10 LAB — PROTIME-INR
INR: 1.02
Prothrombin Time: 13.3 seconds (ref 11.4–15.2)

## 2018-01-10 LAB — CBG MONITORING, ED: Glucose-Capillary: 69 mg/dL — ABNORMAL LOW (ref 70–99)

## 2018-01-10 LAB — APTT: aPTT: 30 seconds (ref 24–36)

## 2018-01-10 NOTE — Consult Note (Addendum)
Neurology Consultation  Reason for Consult: Code stroke Referring Physician: Dr. Jacqulyn Bath  CC: Right facial droop and weakness  History is obtained from: Patient  HPI: [name] is a 77 y.o. female with history of ventricular fibrillation, TIA, ischemic cardiomyopathy, hyperlipidemia, hypertension, CAD.  Patient noted while she is at home at approximately 930 she felt as though the right side of her face arm and leg did not feel the same.  At approximately 1150 she approached her husband and asked if she had a facial droop secondary to the symptoms.  Husband called EMS for these reasons.  Patient was brought to the hospital and in route patient stated that some of her symptoms might have resolved slightly.  Upon arriving at the hospital patient had inconsistencies in her exam including intermittent right facial droop along with give way weakness on the right-hand side.  CT the head was negative.  Given exam and minimal symptoms noted TPA was not administered  Also of note patient has had similar symptoms in the past including right-sided weakness and the work-up has remained unrevealing for any apparent cause.  LKW: 0 930 on 01/10/2018 tpa given?: no, minimal symptoms and inconsistent exam Premorbid modified Rankin scale (mRS): 0 NIH stroke scale: 2  ROS:ROS was performed and is negative except as noted in the HPI.  Past Medical History:  Diagnosis Date  . Compression fracture    T9  . COPD (chronic obstructive pulmonary disease) (HCC)   . Coronary artery disease 08/14/2009   a. 08/2009: Ant MI c/b v-fib arrest, s/p PTCA/BMS to LAD. (STENT Placement); b. 2013 Myoview: no ischemia; c. 11/2012 Cath: patent mLAD stent, RI 70 (stable), otw nonobs dzs, EF 65%;  d. 07/2016 Lexiscan MV: EF 56%, no ischemia/infarct.  Marland Kitchen GERD (gastroesophageal reflux disease)   . HTN (hypertension)   . Hyperlipidemia   . Ischemic cardiomyopathy    a. EF 40-45% in 08/2009 at time of MI. b. Improved to 55-60% by June  2011; c. 03/2016 Echo: EF 55-60%, no rwma, Gr1 DD, mild LVH.  . Osteoarthritis   . Osteopenia   . PONV (postoperative nausea and vomiting)   . Postoperative groin pseudoaneurysm (HCC)    a. 04/2010 following diagnostic cardiac cath, s/p compression.  . Rib fractures    left sided second and sixth rib   . Shingles   . TIA (transient ischemic attack)    a. 03/2016 MRI/MRA negative/unremarkable.  . Ventricular fibrillation (HCC)    a. 08/2009: due to anterior MI.     Family History  Problem Relation Age of Onset  . Rheum arthritis Sister     Social History:   reports that she quit smoking about 33 years ago. Her smoking use included cigarettes. She has a 40.00 pack-year smoking history. She has never used smokeless tobacco. She reports that she drinks alcohol. She reports that she does not use drugs.  Medications No current facility-administered medications for this encounter.   Current Outpatient Medications:  .  aspirin EC 81 MG tablet, Take 1 tablet (81 mg total) daily by mouth., Disp: 90 tablet, Rfl: 3 .  calcium carbonate (OS-CAL) 600 MG TABS tablet, Take 600 mg by mouth 2 (two) times daily with a meal. , Disp: , Rfl:  .  Cholecalciferol (VITAMIN D3) 1000 units CAPS, Take 1,000 Units by mouth daily., Disp: , Rfl:  .  Glycopyrrolate-Formoterol (BEVESPI AEROSPHERE) 9-4.8 MCG/ACT AERO, Inhale 2 puffs into the lungs 2 (two) times daily., Disp: 1 Inhaler, Rfl: 11 .  loratadine (CLARITIN) 10 MG tablet, Take 10 mg by mouth daily as needed. , Disp: , Rfl:  .  losartan (COZAAR) 50 MG tablet, TAKE 1 TABLET (50 MG TOTAL) DAILY BY MOUTH., Disp: 90 tablet, Rfl: 1 .  metoprolol succinate (TOPROL-XL) 25 MG 24 hr tablet, Take 1 tablet by mouth daily., Disp: , Rfl:  .  nitroGLYCERIN (NITROSTAT) 0.4 MG SL tablet, Place 1 tablet (0.4 mg total) under the tongue every 5 (five) minutes as needed. For chest pain, Disp: 25 tablet, Rfl: 1 .  pantoprazole (PROTONIX) 40 MG tablet, Take 1 tablet by mouth  daily., Disp: , Rfl:  .  rosuvastatin (CRESTOR) 20 MG tablet, Take 1 tablet (20 mg total) by mouth at bedtime., Disp: 90 tablet, Rfl: 3 .  traMADol (ULTRAM) 50 MG tablet, Take 50 mg by mouth every 6 (six) hours as needed., Disp: , Rfl:  .  Zoledronic Acid (RECLAST IV), Inject into the vein. Once Yearly, Disp: , Rfl:   Exam: Current vital signs: BP 118/73   Pulse (!) 59   Temp 97.8 F (36.6 C) (Oral)   Resp 20   Ht 5' 5.5" (1.664 m)   Wt 78.1 kg   SpO2 96%   BMI 28.20 kg/m  Vital signs in last 24 hours: Temp:  [97.8 F (36.6 C)] 97.8 F (36.6 C) (10/07 1254) Pulse Rate:  [58-62] 59 (10/07 1345) Resp:  [15-22] 20 (10/07 1345) BP: (115-124)/(65-73) 118/73 (10/07 1345) SpO2:  [94 %-97 %] 96 % (10/07 1345) Weight:  [78.1 kg] 78.1 kg (10/07 1255)  Physical Exam  Constitutional: Appears well-developed and well-nourished.  Psych: Affect appropriate to situation Eyes: No scleral injection HENT: No OP obstrucion Head: Normocephalic.  Cardiovascular: Normal rate and regular rhythm.  Respiratory: Effort normal, non-labored breathing GI: Soft.  No distension. There is no tenderness.  Skin: WDI  Neuro: Mental Status: Patient is awake, alert, oriented to person, place, month, year, and situation. Patient is able to give a clear and coherent history. No signs of aphasia or neglect Cranial Nerves: II: Visual Fields are full. Pupils are equal, round, and reactive to light.   III,IV, VI: EOMI without ptosis or diploplia.  V: Facial sensation is symmetric to temperature VII: Intermittent right facial droop when smiling.  VIII: hearing is intact to voice X: Uvula elevates symmetrically XI: Strength noted on the wrong sternocleidomastoid XII: tongue is midline without atrophy or fasciculations.  Motor: Tone is normal. Bulk is normal. 5/5 strength was present in all four extremities.  Sensory: Sensation is symmetric to light touch and temperature in the arms and legs. Deep Tendon  Reflexes: 2+ and symmetric in the biceps and patellae.  Plantars: Toes are downgoing bilaterally.  Cerebellar: FNF and HKS are intact bilaterally  Labs I have reviewed labs in epic and the results pertinent to this consultation are:   CBC    Component Value Date/Time   WBC 7.6 01/10/2018 1233   RBC 3.65 (L) 01/10/2018 1233   HGB 12.2 01/10/2018 1240   HCT 36.0 01/10/2018 1240   PLT 238 01/10/2018 1233   MCV 103.0 (H) 01/10/2018 1233   MCH 34.0 01/10/2018 1233   MCHC 33.0 01/10/2018 1233   RDW 12.7 01/10/2018 1233   LYMPHSABS 1.4 01/10/2018 1233   MONOABS 0.7 01/10/2018 1233   EOSABS 0.1 01/10/2018 1233   BASOSABS 0.0 01/10/2018 1233    CMP     Component Value Date/Time   NA 135 01/10/2018 1240   K 4.3 01/10/2018 1240  CL 99 01/10/2018 1240   CO2 37 (H) 12/09/2017 1430   GLUCOSE 76 01/10/2018 1240   BUN 10 01/10/2018 1240   CREATININE 0.70 01/10/2018 1240   CREATININE 0.71 07/22/2017 0923   CALCIUM 10.3 12/09/2017 1430   PROT 6.7 07/22/2017 0923   ALBUMIN 3.6 03/16/2016 1219   AST 18 07/22/2017 0923   ALT 14 07/22/2017 0923   ALKPHOS 56 03/16/2016 1219   BILITOT 0.6 07/22/2017 0923   GFRNONAA 82 07/22/2017 0923   GFRAA 95 07/22/2017 0923    Lipid Panel     Component Value Date/Time   CHOL 158 03/17/2016 0243   TRIG 72 03/17/2016 0243   HDL 82 03/17/2016 0243   CHOLHDL 1.9 03/17/2016 0243   VLDL 14 03/17/2016 0243   LDLCALC 62 03/17/2016 0243   LDLDIRECT 134.9 11/21/2007 1028  Felicie Morn PA-C [facility] [phone]  M-F  (9:00 am- 5:00 PM)  01/10/2018, 12:53 PM    Attending addendum Patient seen and examined Imaging reviewed personally.  Imaging I have reviewed the images obtained:  CT-scan of the brain- stable advanced atrophy and white matter disease.  Likely reflects the sequela of chronic microvascular ischemia.  No intracranial abnormalities that are acute   MRI examination of the brain--pending  Assessment: 77 year old  female presenting with right-sided paresthesias as well as right facial droop that has since resolved. She has had similar episodes in the past with a completely negative work-up. This could represent a left cerebral hemispheric TIA versus a stroke but her exam has some inconsistencies which makes me also wonder whether this could be something psychogenic.  Not a candidate for TPA due to nearly resolved symptoms Not a candidate for endovascular thrombectomy due to no signs of LVO.  Impression: Right sided weakness  Recommendations: -Obtain MRI brain without contrast - If MRI brain does show stroke she will need admission for stroke work-up - If MRI brain does not show stroke at this point time no further stroke work-up - Continue current aspirin and Crestor -Outpatient neurology follow-up - if MRI negative for stroke. -Plan relayed to the ED provider Dr. Jacqulyn Bath in person.  Please call neurology with questions  -- Milon Dikes, MD [facility] Pager: [phone] If 7pm to 7am, please call on call as listed on AMION.   Addendum MRI brain negative for acute stroke.  Chronic microhemorrhages and chronic white matter disease. No further inpatient work-up required at this time. Continue her antiplatelets and statin and follow-up with [facility] neurology, where she is an established patient.  -- Milon Dikes, MD [facility] Pager: [phone] If 7pm to 7am, please call on call as listed on AMION.

## 2018-01-10 NOTE — Discharge Instructions (Signed)
You were seen in the ED today with stroke-like symptoms. Your MRI did not show a stroke. You should follow up with your PCP and call the Neurologist listed to schedule a follow up. Return to the ED with any new or worsening symptoms.

## 2018-01-10 NOTE — Code Documentation (Signed)
77 year old female coming from home with sudden onset of right sided weakness and facial droop that started at 1150 this morning. EMS was called by family and patient's symptoms decreased during examination. Before transport started, EMS noted that patient's symptoms returned. EMS activated a Code Stroke. Stroke Team met patient upon arrival. Initial NIHSS 4 due to slight right sided facial droop, right arm drift, right leg drift, and decreased sensation to the right side. CT completed. No hemorrhage noted. Neurologist noted similar instance happened before now with no TIA or changes on CT/MRI. Upon neurologist exam, symptoms changed and exam was not consistent. Pt to have MRI completed. Too mild to treat. Handoff given to [place], Therapist, sports. Pt remains in the window until 1620. q30 Neuro and q15 VS to be completed until MRI is completed. Not an IR candidate.

## 2018-01-10 NOTE — ED Triage Notes (Signed)
Pt arrives EMS from home with c/o left sided facial weakness/numbness, right sided weakness and numbness.EMS states symptoms resolved and pt ambulated to stretcher then recured in ambulance .  Pt arrives at bridge for labs drawn and to ct #3 with Dr. Della Goo. CT performed. To room 21

## 2018-01-10 NOTE — ED Notes (Signed)
Pt states she understands instruct5iosne stable with husband.

## 2018-01-10 NOTE — ED Provider Notes (Signed)
Emergency Department Provider Note   I have reviewed the triage vital signs and the nursing notes.   HISTORY  Chief Complaint Code Stroke   HPI [name] is a 77 y.o. female with PMH of COPD, CAD, HTN, HLD, and OA's to the emergency department as a code stroke.  The patient developed acute onset right-sided weakness with left face droop.  Patient has had similar episodes in the past.  She alerted family around 11:30 AM and was transported to the emergency department.  He denies any chest pain, shortness of breath, heart palpitations.  No abdominal discomfort. No radiation of symptoms or modifying factors.   Past Medical History:  Diagnosis Date  . Compression fracture    T9  . COPD (chronic obstructive pulmonary disease) (HCC)   . Coronary artery disease 08/14/2009   a. 08/2009: Ant MI c/b v-fib arrest, s/p PTCA/BMS to LAD. (STENT Placement); b. 2013 Myoview: no ischemia; c. 11/2012 Cath: patent mLAD stent, RI 70 (stable), otw nonobs dzs, EF 65%;  d. 07/2016 Lexiscan MV: EF 56%, no ischemia/infarct.  Marland Kitchen GERD (gastroesophageal reflux disease)   . HTN (hypertension)   . Hyperlipidemia   . Ischemic cardiomyopathy    a. EF 40-45% in 08/2009 at time of MI. b. Improved to 55-60% by June 2011; c. 03/2016 Echo: EF 55-60%, no rwma, Gr1 DD, mild LVH.  . Osteoarthritis   . Osteopenia   . PONV (postoperative nausea and vomiting)   . Postoperative groin pseudoaneurysm    a. 04/2010 following diagnostic cardiac cath, s/p compression.  . Rib fractures    left sided second and sixth rib   . Shingles   . TIA (transient ischemic attack)    a. 03/2016 MRI/MRA negative/unremarkable.  . Ventricular fibrillation (HCC)    a. 08/2009: due to anterior MI.    Patient Active Problem List   Diagnosis Date Noted  . CAP (community acquired pneumonia) 01/03/2018  . Pneumonia of left lower lobe due to infectious organism (HCC) 12/09/2017  . TIA (transient ischemic attack) 03/18/2016  . Right sided  weakness   . Stroke (HCC) 03/16/2016  . Stroke-like symptoms   . History of placement of stent in LAD coronary artery   . Dyspnea on exertion 08/08/2015  . Preoperative clearance 05/09/2015  . Atypical chest pain 03/21/2015  . Acute UTI 03/21/2015  . Hydronephrosis, right 03/21/2015  . Proteinuria 03/21/2015  . HTN (hypertension) 03/21/2015  . Chronic diastolic heart failure (HCC) 03/21/2015  . 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 Unstable angina (HCC) 11/16/2012  . Palpitations 07/22/2010  . COPD GOLD II 05/30/2010  . Hyperlipidemia 08/27/2009  . AMI 08/27/2009  . CAD (coronary artery disease) 08/27/2009  . Ventricular fibrillation (HCC) 08/14/2009  . COMPRESSION FRACTURE, LUMBAR VERTEBRAE 12/21/2008  . SPINAL STENOSIS, LUMBAR 08/31/2008  . Macrocytic anemia 08/30/2008  . ABDOMINAL MASS 07/11/2008  . Shortness of breath 07/10/2008  . UNSPECIFIED VITAMIN D DEFICIENCY 03/09/2008  . GERD 03/09/2008  . OSTEOARTHRITIS 11/21/2007  . OSTEOPENIA 11/21/2007    Past Surgical History:  Procedure Laterality Date  . APPENDECTOMY    . BACK SURGERY    . LEFT HEART CATHETERIZATION WITH CORONARY ANGIOGRAM N/A 11/16/2012   Procedure: LEFT HEART CATHETERIZATION WITH CORONARY ANGIOGRAM;  Surgeon: Kathleene Hazel, MD;  Location: [facility] CATH LAB;  Service: Cardiovascular;  Laterality: N/A;  . LEG SURGERY     R low  . ORIF HIP FRACTURE    . TONSILLECTOMY     Allergies Codeine;  Imdur [isosorbide dinitrate]; Other; and Sulfonamide derivatives  Family History  Problem Relation Age of Onset  . Rheum arthritis Sister     Social History Social History   Tobacco Use  . Smoking status: Former Smoker    Packs/day: 2.00    Years: 20.00    Pack years: 40.00    Types: Cigarettes    Last attempt to quit: 04/06/1984    Years since quitting: 33.7  . Smokeless tobacco: Never Used  Substance Use Topics  . Alcohol use: Yes    Comment: a couple glasses of wine nightly  .  Drug use: No    Review of Systems  Constitutional: No fever/chills Eyes: No visual changes. ENT: No sore throat. Cardiovascular: Denies chest pain. Respiratory: Denies shortness of breath. Gastrointestinal: No abdominal pain.  No nausea, no vomiting.  No diarrhea.  No constipation. Genitourinary: Negative for dysuria. Musculoskeletal: Negative for back pain. Skin: Negative for rash. Neurological: Negative for headaches. Positive right arm/leg weakness and left face weakness.   10-point ROS otherwise negative.  ____________________________________________   PHYSICAL EXAM:  VITAL SIGNS: ED Triage Vitals  Enc Vitals Group     BP 01/10/18 1254 115/65     Pulse Rate 01/10/18 1254 (!) 59     Resp 01/10/18 1254 15     Temp 01/10/18 1254 97.8 F (36.6 C)     Temp Source 01/10/18 1254 Oral     SpO2 01/10/18 1254 97 %     Weight 01/10/18 1200 172 lb 1.6 oz (78.1 kg)     Height 01/10/18 1255 5' 5.5" (1.664 m)     Pain Score 01/10/18 1254 2   Constitutional: Alert and oriented. Well appearing and in no acute distress. Eyes: Conjunctivae are normal. Head: Atraumatic. Nose: No congestion/rhinnorhea. Mouth/Throat: Mucous membranes are moist.  Neck: No stridor.   Cardiovascular: Normal rate, regular rhythm. Good peripheral circulation. Grossly normal heart sounds.   Respiratory: Normal respiratory effort.  No retractions. Lungs CTAB. Gastrointestinal: Soft and nontender. No distention.  Musculoskeletal: No lower extremity tenderness nor edema. No gross deformities of extremities. Neurologic:  Normal speech and language. No gross focal neurologic deficits are appreciated.  Skin:  Skin is warm, dry and intact. No rash noted.  ____________________________________________   LABS (all labs ordered are listed, but only abnormal results are displayed)  Labs Reviewed  CBC - Abnormal; Notable for the following components:      Result Value   RBC 3.65 (*)    MCV 103.0 (*)    All  other components within normal limits  COMPREHENSIVE METABOLIC PANEL - Abnormal; Notable for the following components:   Sodium 134 (*)    Calcium 8.5 (*)    Total Protein 6.1 (*)    Albumin 3.4 (*)    All other components within normal limits  CBG MONITORING, ED - Abnormal; Notable for the following components:   Glucose-Capillary 69 (*)    All other components within normal limits  I-STAT CHEM 8, ED - Abnormal; Notable for the following components:   Calcium, Ion 1.07 (*)    All other components within normal limits  PROTIME-INR  APTT  DIFFERENTIAL  I-STAT TROPONIN, ED   ____________________________________________  EKG   EKG Interpretation  Date/Time:  Monday January 10 2018 12:54:34 EDT Ventricular Rate:  62 PR Interval:    QRS Duration: 96 QT Interval:  423 QTC Calculation: 430 R Axis:   -27 Text Interpretation:  Sinus rhythm Borderline left axis deviation Low voltage, extremity and  precordial leads Consider anterior infarct No significant change since last tracing Confirmed by Raeford Razor [phone]) on 01/10/2018 12:57:23 PM Also confirmed by Raeford Razor [phone]), editor Barbette Hair [phone])  on 01/10/2018 1:34:11 PM       ____________________________________________  RADIOLOGY  Mr Brain Wo Contrast  Result Date: 01/10/2018 CLINICAL DATA:  Stroke.  Right-sided weakness. EXAM: MRI HEAD WITHOUT CONTRAST TECHNIQUE: Multiplanar, multiecho pulse sequences of the brain and surrounding structures were obtained without intravenous contrast. COMPARISON:  CT head 01/10/2018 FINDINGS: Brain: Negative for acute infarct. Chronic microvascular ischemic change in the white matter and pons of a moderate degree. Multiple areas of chronic microhemorrhage in the brain. No mass or fluid collection. No midline shift. Vascular: Normal arterial flow voids Skull and upper cervical spine: Negative Sinuses/Orbits: Mild mucosal edema paranasal sinuses. Bilateral mastoid effusion. Bilateral  cataract surgery Other: None IMPRESSION: Negative for acute infarct. Moderate atrophy and chronic microvascular ischemia. Multiple areas of chronic microhemorrhage likely due to hypertension. Electronically Signed   By: Marlan Palau M.D.   On: 01/10/2018 15:00   Ct Head Code Stroke Wo Contrast  Result Date: 01/10/2018 CLINICAL DATA:  Code stroke. Code stroke. Right-sided weakness. Left facial droop. EXAM: CT HEAD WITHOUT CONTRAST TECHNIQUE: Contiguous axial images were obtained from the base of the skull through the vertex without intravenous contrast. COMPARISON:  MRI brain 03/16/2016. CT head without contrast 03/16/2016. FINDINGS: Brain: Advanced white matter disease bilaterally is similar the prior exam. No acute intracranial abnormality is present. Remote ischemic changes are again noted in the thalami bilaterally. Generalized atrophy is unchanged. No acute infarct, hemorrhage, or mass lesion is present. The brainstem and cerebellum are normal. Basal ganglia calcifications are again noted. Vascular: Atherosclerotic calcifications are present within the cavernous internal carotid arteries bilaterally. There is no hyperdense vessel. Skull: Calvarium is intact. No focal lytic or blastic lesions are present. Sinuses/Orbits: The paranasal sinuses and left mastoid air cells are clear. Fluid is present in the right mastoid air cells with some sclerosis, suggesting chronic disease. No obstructing nasopharyngeal lesion is evident. ASPECTS [facility] Stroke Program Early CT Score) - Ganglionic level infarction (caudate, lentiform nuclei, internal capsule, insula, M1-M3 cortex): 7/7 - Supraganglionic infarction (M4-M6 cortex): 3/3 Total score (0-10 with 10 being normal): 10/10 IMPRESSION: 1. Stable advanced atrophy and white matter disease. This likely reflects the sequela of chronic microvascular ischemia. 2. Atherosclerosis. 3. No acute intracranial abnormality. 4. ASPECTS is 10/10 The above was relayed via text  pager to Dr. Wilford Corner on 01/10/2018 at 12:45 . Electronically Signed   By: Marin Roberts M.D.   On: 01/10/2018 12:45    ____________________________________________   PROCEDURES  Procedure(s) performed:   Procedures  None ____________________________________________   INITIAL IMPRESSION / ASSESSMENT AND PLAN / ED COURSE  Pertinent labs & imaging results that were available during my care of the patient were reviewed by me and considered in my medical decision making (see chart for details).  Patient presents to the emergency department as a code stroke.  Evaluated by Dr. Jerrell Belfast on arrival.  No tPA.  Has had similar episodes in the past.  CT head reviewed with no acute findings.  Labs are pending.  MRI ordered and will re-evaluate after imaging.   Labs and MRI reviewed. Patient symptoms have resolved. Spoke with Dr. Jerrell Belfast who advises discharge with PCP and Neurology follow up. Provided contact info for local Neurology.   At this time, I do not feel there is any life-threatening condition present. I have reviewed and  discussed all results (EKG, imaging, lab, urine as appropriate), exam findings with patient. I have reviewed nursing notes and appropriate previous records.  I feel the patient is safe to be discharged home without further emergent workup. Discussed usual and customary return precautions. Patient and family (if present) verbalize understanding and are comfortable with this plan.  Patient will follow-up with their primary care provider. If they do not have a primary care provider, information for follow-up has been provided to them. All questions have been answered.  ____________________________________________  FINAL CLINICAL IMPRESSION(S) / ED DIAGNOSES  Final diagnoses:  Stroke-like symptoms    Note:  This document was prepared using Dragon voice recognition software and may include unintentional dictation errors.  Alona Bene, MD Emergency Medicine    [name],  [name] Repress, MD 01/10/18 2101

## 2018-01-10 NOTE — ED Notes (Signed)
Pt returns from MRI ° °

## 2018-01-10 NOTE — ED Notes (Signed)
Pt to MRI

## 2018-01-18 ENCOUNTER — Ambulatory Visit (INDEPENDENT_AMBULATORY_CARE_PROVIDER_SITE_OTHER): Payer: Medicare Other | Admitting: Physician Assistant

## 2018-01-18 ENCOUNTER — Ambulatory Visit: Payer: Medicare Other | Admitting: Rheumatology

## 2018-01-18 ENCOUNTER — Encounter: Payer: Self-pay | Admitting: Physician Assistant

## 2018-01-18 VITALS — BP 120/73 | HR 57 | Resp 14 | Ht 65.0 in | Wt 170.6 lb

## 2018-01-18 DIAGNOSIS — Z955 Presence of coronary angioplasty implant and graft: Secondary | ICD-10-CM

## 2018-01-18 DIAGNOSIS — M810 Age-related osteoporosis without current pathological fracture: Secondary | ICD-10-CM | POA: Diagnosis not present

## 2018-01-18 DIAGNOSIS — I2510 Atherosclerotic heart disease of native coronary artery without angina pectoris: Secondary | ICD-10-CM

## 2018-01-18 DIAGNOSIS — M19041 Primary osteoarthritis, right hand: Secondary | ICD-10-CM | POA: Diagnosis not present

## 2018-01-18 DIAGNOSIS — M4014 Other secondary kyphosis, thoracic region: Secondary | ICD-10-CM

## 2018-01-18 DIAGNOSIS — Z8719 Personal history of other diseases of the digestive system: Secondary | ICD-10-CM

## 2018-01-18 DIAGNOSIS — M1711 Unilateral primary osteoarthritis, right knee: Secondary | ICD-10-CM

## 2018-01-18 DIAGNOSIS — Z8639 Personal history of other endocrine, nutritional and metabolic disease: Secondary | ICD-10-CM

## 2018-01-18 DIAGNOSIS — Z8709 Personal history of other diseases of the respiratory system: Secondary | ICD-10-CM

## 2018-01-18 DIAGNOSIS — M5136 Other intervertebral disc degeneration, lumbar region: Secondary | ICD-10-CM

## 2018-01-18 DIAGNOSIS — M19042 Primary osteoarthritis, left hand: Secondary | ICD-10-CM

## 2018-01-20 ENCOUNTER — Other Ambulatory Visit: Payer: Self-pay | Admitting: Cardiology

## 2018-01-25 ENCOUNTER — Ambulatory Visit: Payer: Medicare Other | Attending: Physician Assistant | Admitting: Physical Therapy

## 2018-01-25 ENCOUNTER — Other Ambulatory Visit: Payer: Self-pay

## 2018-01-25 ENCOUNTER — Encounter: Payer: Self-pay | Admitting: Physical Therapy

## 2018-01-25 DIAGNOSIS — M6281 Muscle weakness (generalized): Secondary | ICD-10-CM | POA: Diagnosis present

## 2018-01-25 DIAGNOSIS — R293 Abnormal posture: Secondary | ICD-10-CM | POA: Diagnosis present

## 2018-01-25 NOTE — Therapy (Signed)
[facility] [address] [place], [place], [address] Phone: [phone]   Fax:  [phone]  Physical Therapy Evaluation  Patient Details  Name: [name] MRN: [id number] Date of Birth: Aug 09, 1940 Referring Provider (PT): Sherron Ales PA  Progress Note Reporting Period 01/25/2018 to 03/08/2018  See note below for Objective Data and Assessment of Progress/Goals.      Encounter Date: 01/25/2018  PT End of Session - 01/25/18 1425    Visit Number  1    Number of Visits  12    Date for PT Re-Evaluation  03/08/18    Authorization Type  Medicare progress note at 10 visits kx at 15     PT Start Time  1415    PT Stop Time  1458    PT Time Calculation (min)  43 min    Activity Tolerance  Patient tolerated treatment well    Behavior During Therapy  [facility] for tasks assessed/performed       Past Medical History:  Diagnosis Date  . Compression fracture    T9  . COPD (chronic obstructive pulmonary disease) (HCC)   . Coronary artery disease 08/14/2009   a. 08/2009: Ant MI c/b v-fib arrest, s/p PTCA/BMS to LAD. (STENT Placement); b. 2013 Myoview: no ischemia; c. 11/2012 Cath: patent mLAD stent, RI 70 (stable), otw nonobs dzs, EF 65%;  d. 07/2016 Lexiscan MV: EF 56%, no ischemia/infarct.  Marland Kitchen GERD (gastroesophageal reflux disease)   . HTN (hypertension)   . Hyperlipidemia   . Ischemic cardiomyopathy    a. EF 40-45% in 08/2009 at time of MI. b. Improved to 55-60% by June 2011; c. 03/2016 Echo: EF 55-60%, no rwma, Gr1 DD, mild LVH.  . Osteoarthritis   . Osteopenia   . PONV (postoperative nausea and vomiting)   . Postoperative groin pseudoaneurysm    a. 04/2010 following diagnostic cardiac cath, s/p compression.  . Rib fractures    left sided second and sixth rib   . Shingles   . TIA (transient ischemic attack)    a. 03/2016 MRI/MRA negative/unremarkable.  . Ventricular fibrillation (HCC)    a. 08/2009: due to anterior MI.    Past  Surgical History:  Procedure Laterality Date  . APPENDECTOMY    . BACK SURGERY    . LEFT HEART CATHETERIZATION WITH CORONARY ANGIOGRAM N/A 11/16/2012   Procedure: LEFT HEART CATHETERIZATION WITH CORONARY ANGIOGRAM;  Surgeon: Kathleene Hazel, MD;  Location: [facility] CATH LAB;  Service: Cardiovascular;  Laterality: N/A;  . LEG SURGERY     R low  . ORIF HIP FRACTURE    . TONSILLECTOMY      There were no vitals filed for this visit.   Subjective Assessment - 01/25/18 1421    Subjective  Patient has a history of Osteoperosis and COPD. She feels like over time her UE atre becoming weaker. Her MD feels like her posture is likley effecting her COPD.     Currently in Pain?  No/denies   Patient had an episode of neck pain which she thought might be a TIA on 01/10/2018         [facility] PT Assessment - 01/25/18 0001      Assessment   Medical Diagnosis  Ostepoperosis/ Kyphosis     Referring Provider (PT)  Sherron Ales PA    Onset Date/Surgical Date  --   Several years    Hand Dominance  Right    Next MD Visit  None scheduled  Prior Therapy  None       Precautions   Precautions  None      Restrictions   Weight Bearing Restrictions  No      Balance Screen   Has the patient fallen in the past 6 months  No    Has the patient had a decrease in activity level because of a fear of falling?   No    Is the patient reluctant to leave their home because of a fear of falling?   No      Home Environment   Additional Comments  Nothing pertinant       Prior Function   Level of Independence  Independent    Vocation  Retired    Leisure  liked to walk       Cognition   Overall Cognitive Status  Within Functional Limits for tasks assessed    Attention  Focused    Focused Attention  Appears intact    Memory  Appears intact    Awareness  Appears intact    Problem Solving  Appears intact      Observation/Other Assessments   Observations  dowengers hump     Focus on Therapeutic Outcomes  (FOTO)   50% limitation       Sensation   Light Touch  Appears Intact    Additional Comments  Denies parathesias       Coordination   Gross Motor Movements are Fluid and Coordinated  Yes    Fine Motor Movements are Fluid and Coordinated  Yes      Posture/Postural Control   Posture/Postural Control  Postural limitations    Postural Limitations  Increased thoracic kyphosis;Forward head;Rounded Shoulders      ROM / Strength   AROM / PROM / Strength  AROM;PROM;Strength      AROM   Overall AROM Comments  full active ROM of the shoulder but pain going to the back of the head     AROM Assessment Site  Lumbar    Lumbar Flexion  60    Lumbar Extension  10    Lumbar - Right Side Bend  Pain on the left sidebennding to the right     Lumbar - Right Rotation  limited 25%     Lumbar - Left Rotation  limited 25%       Strength   Strength Assessment Site  Shoulder    Right/Left Shoulder  Right;Left    Right Shoulder Flexion  4/5    Right Shoulder Internal Rotation  4+/5    Right Shoulder External Rotation  5/5    Left Shoulder Flexion  4/5    Left Shoulder Internal Rotation  4+/5    Left Shoulder External Rotation  4+/5      Palpation   Palpation comment  spasming of lumbar parapsinals                 Objective measurements completed on examination: See above findings.                PT Short Term Goals - 01/25/18 1538      PT SHORT TERM GOAL #1   Title  Patient will be independnet with basic HEP for postrue     Time  3    Period  Weeks    Status  New    Target Date  02/15/18      PT SHORT TERM GOAL #2   Title  Patient will demsontrate 5/5 gross  bilateral UE strength     Time  3    Period  Weeks    Status  New    Target Date  02/15/18      PT SHORT TERM GOAL #3   Title  Patient will reach behind her head with right UE without pain     Time  3    Period  Weeks    Status  New    Target Date  02/15/18        PT Long Term Goals - 01/25/18 1539       PT LONG TERM GOAL #1   Title  Patient will be indepdnent with a complete osteoperosis program with potentail progression back to the gym     Time  6    Period  Weeks    Status  New    Target Date  03/08/18      PT LONG TERM GOAL #2   Title  Patient will sit with improved posture without cuing     Time  6    Period  Weeks    Status  New    Target Date  03/08/18      PT LONG TERM GOAL #3   Title  Patient will demonstrate a  45% limitation on FOTO     Time  6    Period  Weeks    Status  New    Target Date  03/08/18             Plan - 01/25/18 1531    Clinical Impression Statement  Patient is a 77 year old female with an increased kyphosis and osteoperosis. She presents with UE weakness and an abnormal psoture in sitting and standing. She would benefit from skilled therapy to improve posture and to develop a porgram that wil promote posture and improve bone density through low stress loading.     Clinical Presentation  Evolving    Clinical Decision Making  Moderate    Rehab Potential  Good    PT Frequency  2x / week    PT Duration  6 weeks    PT Treatment/Interventions  ADLs/Self Care Home Management;Cryotherapy;Electrical Stimulation;Neuromuscular re-education;Patient/family education;Manual techniques;Passive range of motion;Splinting;Taping    PT Next Visit Plan  consider light LE loading activity such as standing 3 way hip and heel raises; review current HEP. consdier bilateral abduction; Consider open book stretching if tolerated. Add the nu-step; would like to eventually progress to gym.     PT Home Exercise Plan  bilteral ER; shoulder flexion with band abdcution; scap retraction; shoulder extension    Consulted and Agree with Plan of Care  Patient       Patient will benefit from skilled therapeutic intervention in order to improve the following deficits and impairments:  Improper body mechanics, Postural dysfunction, Decreased range of motion, Decreased activity  tolerance, Decreased strength, Impaired UE functional use  Visit Diagnosis: Abnormal posture - Plan: PT plan of care cert/re-cert  Muscle weakness (generalized) - Plan: PT plan of care cert/re-cert     Problem List Patient Active Problem List   Diagnosis Date Noted  . CAP (community acquired pneumonia) 01/03/2018  . Pneumonia of left lower lobe due to infectious organism (HCC) 12/09/2017  . TIA (transient ischemic attack) 03/18/2016  . Right sided weakness   . Stroke (HCC) 03/16/2016  . Stroke-like symptoms   . History of placement of stent in LAD coronary artery   . Dyspnea on exertion 08/08/2015  . Preoperative  clearance 05/09/2015  . Atypical chest pain 03/21/2015  . Acute UTI 03/21/2015  . Hydronephrosis, right 03/21/2015  . Proteinuria 03/21/2015  . HTN (hypertension) 03/21/2015  . Chronic diastolic heart failure (HCC) 03/21/2015  . 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 Unstable angina (HCC) 11/16/2012  . Palpitations 07/22/2010  . COPD GOLD II 05/30/2010  . Hyperlipidemia 08/27/2009  . AMI 08/27/2009  . CAD (coronary artery disease) 08/27/2009  . Ventricular fibrillation (HCC) 08/14/2009  . COMPRESSION FRACTURE, LUMBAR VERTEBRAE 12/21/2008  . SPINAL STENOSIS, LUMBAR 08/31/2008  . Macrocytic anemia 08/30/2008  . ABDOMINAL MASS 07/11/2008  . Shortness of breath 07/10/2008  . UNSPECIFIED VITAMIN D DEFICIENCY 03/09/2008  . GERD 03/09/2008  . OSTEOARTHRITIS 11/21/2007  . OSTEOPENIA 11/21/2007    Dessie Coma PT DPT 01/25/2018, 4:19 PM   Donzetta Kohut SPT  01/25/2018   [facility] [address] [place], [place], [address] Phone: [phone]   Fax:  [phone]  Name: TOBY AYAD MRN: [id number] Date of Birth: Dec 26, 1940

## 2018-02-02 ENCOUNTER — Ambulatory Visit: Payer: Medicare Other | Admitting: Physical Therapy

## 2018-02-02 ENCOUNTER — Encounter: Payer: Self-pay | Admitting: Physical Therapy

## 2018-02-02 DIAGNOSIS — R293 Abnormal posture: Secondary | ICD-10-CM

## 2018-02-02 DIAGNOSIS — M6281 Muscle weakness (generalized): Secondary | ICD-10-CM

## 2018-02-02 NOTE — Patient Instructions (Signed)
Knee High   Holding stable object, raise knee to hip level, then lower knee. Repeat with other knee. Complete __10_ repetitions. Do __2__ sessions per day.  ABDUCTION: Standing (Active)   Stand, feet flat. Lift right leg out to side. Use _0__ lbs. Complete __10_ repetitions. Perform __2_ sessions per day.         EXTENSION: Standing (Active)  Stand, both feet flat. Draw right leg behind body as far as possible. Use 0___ lbs. Complete 10 repetitions. Perform __2_ sessions per day.  Copyright  VHI. All rights reserved   

## 2018-02-02 NOTE — Therapy (Signed)
[facility]patient [facility] [address] [place], [place], [address] Phone: [phone]   Fax:  [phone]  Physical Therapy Treatment  Patient Details  Name: [name] MRN: [id number] Date of Birth: 11-17-1940 Referring Provider (PT): Sherron Ales PA   Encounter Date: 02/02/2018  PT End of Session - 02/02/18 1307    Visit Number  2    Number of Visits  12    Date for PT Re-Evaluation  03/08/18    Authorization Type  Medicare progress note at 10 visits kx at 15     PT Start Time  0102    PT Stop Time  0140    PT Time Calculation (min)  38 min       Past Medical History:  Diagnosis Date  . Compression fracture    T9  . COPD (chronic obstructive pulmonary disease) (HCC)   . Coronary artery disease 08/14/2009   a. 08/2009: Ant MI c/b v-fib arrest, s/p PTCA/BMS to LAD. (STENT Placement); b. 2013 Myoview: no ischemia; c. 11/2012 Cath: patent mLAD stent, RI 70 (stable), otw nonobs dzs, EF 65%;  d. 07/2016 Lexiscan MV: EF 56%, no ischemia/infarct.  Marland Kitchen GERD (gastroesophageal reflux disease)   . HTN (hypertension)   . Hyperlipidemia   . Ischemic cardiomyopathy    a. EF 40-45% in 08/2009 at time of MI. b. Improved to 55-60% by June 2011; c. 03/2016 Echo: EF 55-60%, no rwma, Gr1 DD, mild LVH.  . Osteoarthritis   . Osteopenia   . PONV (postoperative nausea and vomiting)   . Postoperative groin pseudoaneurysm    a. 04/2010 following diagnostic cardiac cath, s/p compression.  . Rib fractures    left sided second and sixth rib   . Shingles   . TIA (transient ischemic attack)    a. 03/2016 MRI/MRA negative/unremarkable.  . Ventricular fibrillation (HCC)    a. 08/2009: due to anterior MI.    Past Surgical History:  Procedure Laterality Date  . APPENDECTOMY    . BACK SURGERY    . LEFT HEART CATHETERIZATION WITH CORONARY ANGIOGRAM N/A 11/16/2012   Procedure: LEFT HEART CATHETERIZATION WITH CORONARY ANGIOGRAM;  Surgeon: Kathleene Hazel, MD;   Location: [facility] CATH LAB;  Service: Cardiovascular;  Laterality: N/A;  . LEG SURGERY     R low  . ORIF HIP FRACTURE    . TONSILLECTOMY      There were no vitals filed for this visit.  Subjective Assessment - 02/02/18 1304    Subjective  Once in awhile the exercises make me feel achey.  Doing well otherwise.     Currently in Pain?  No/denies                       [facility] Adult PT Treatment/Exercise - 02/02/18 0001      Self-Care   Self-Care  ADL's    ADL's  Began education on body mechanics as it relates to osteoporosis, pt receptive to body mechanics demonstrations including hip hinge and golfers Pick up       Exercises   Exercises  Knee/Hip;Lumbar      Lumbar Exercises: Aerobic   Nustep  L3 (reduced to level 2 ) x 5 minutes.       Lumbar Exercises: Standing   Row  15 reps;Theraband    Theraband Level (Row)  Level 1 (Yellow)    Shoulder Extension  15 reps    Theraband Level (Shoulder Extension)  Level 1 (Yellow)  Other Standing Lumbar Exercises  2 # bicep curls 10 x 2 bilat     Other Standing Lumbar Exercises  standing 3 way hip at counter x 10 each bilateral       Lumbar Exercises: Supine   Other Supine Lumbar Exercises  supine yellow band horizontal abduction and narrow grip pullovers x 15 each              PT Education - 02/02/18 1337    Education Details  HEP     Person(s) Educated  Patient    Methods  Explanation;Handout    Comprehension  Verbalized understanding       PT Short Term Goals - 01/25/18 1538      PT SHORT TERM GOAL #1   Title  Patient will be independnet with basic HEP for postrue     Time  3    Period  Weeks    Status  New    Target Date  02/15/18      PT SHORT TERM GOAL #2   Title  Patient will demsontrate 5/5 gross bilateral UE strength     Time  3    Period  Weeks    Status  New    Target Date  02/15/18      PT SHORT TERM GOAL #3   Title  Patient will reach behind her head with right UE without pain     Time  3     Period  Weeks    Status  New    Target Date  02/15/18        PT Long Term Goals - 01/25/18 1539      PT LONG TERM GOAL #1   Title  Patient will be indepdnent with a complete osteoperosis program with potentail progression back to the gym     Time  6    Period  Weeks    Status  New    Target Date  03/08/18      PT LONG TERM GOAL #2   Title  Patient will sit with improved posture without cuing     Time  6    Period  Weeks    Status  New    Target Date  03/08/18      PT LONG TERM GOAL #3   Title  Patient will demonstrate a  45% limitation on FOTO     Time  6    Period  Weeks    Status  New    Target Date  03/08/18            Plan - 02/02/18 1307    Clinical Impression Statement  Pt reports compliance with HEP. Progressed HEP with closed chain and began Nustep. LE fatigue (calves) on Nustep so reduced resistance to Level 2. A little SOB reported with 3 way hip. Added standing bicep curls 2# with arm fatigue reported. Overall she reports feeling fine at end of session.     PT Next Visit Plan  body mechanics/ osteoporosis handout; consider light LE loading activity such as standing 3 way hip and heel raises; review current HEP. consdier bilateral abduction; Consider open book stretching if tolerated. Add the nu-step; would like to eventually progress to gym.     PT Home Exercise Plan  bilteral ER; shoulder flexion with band abdcution; scap retraction; shoulder extension, standing 3 way hip     Consulted and Agree with Plan of Care  Patient       Patient  will benefit from skilled therapeutic intervention in order to improve the following deficits and impairments:  Improper body mechanics, Postural dysfunction, Decreased range of motion, Decreased activity tolerance, Decreased strength, Impaired UE functional use  Visit Diagnosis: Abnormal posture  Muscle weakness (generalized)     Problem List Patient Active Problem List   Diagnosis Date Noted  . CAP (community  acquired pneumonia) 01/03/2018  . Pneumonia of left lower lobe due to infectious organism (HCC) 12/09/2017  . TIA (transient ischemic attack) 03/18/2016  . Right sided weakness   . Stroke (HCC) 03/16/2016  . Stroke-like symptoms   . History of placement of stent in LAD coronary artery   . Dyspnea on exertion 08/08/2015  . Preoperative clearance 05/09/2015  . Atypical chest pain 03/21/2015  . Acute UTI 03/21/2015  . Hydronephrosis, right 03/21/2015  . Proteinuria 03/21/2015  . HTN (hypertension) 03/21/2015  . Chronic diastolic heart failure (HCC) 03/21/2015  . 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 Unstable angina (HCC) 11/16/2012  . Palpitations 07/22/2010  . COPD GOLD II 05/30/2010  . Hyperlipidemia 08/27/2009  . AMI 08/27/2009  . CAD (coronary artery disease) 08/27/2009  . Ventricular fibrillation (HCC) 08/14/2009  . COMPRESSION FRACTURE, LUMBAR VERTEBRAE 12/21/2008  . SPINAL STENOSIS, LUMBAR 08/31/2008  . Macrocytic anemia 08/30/2008  . ABDOMINAL MASS 07/11/2008  . Shortness of breath 07/10/2008  . UNSPECIFIED VITAMIN D DEFICIENCY 03/09/2008  . GERD 03/09/2008  . OSTEOARTHRITIS 11/21/2007  . OSTEOPENIA 11/21/2007    Sherrie Mustache, PTA 02/02/2018, 1:54 PM  [facility] [address] [place], [place], [address] Phone: [phone]   Fax:  [phone]  Name: TIMOTHEA BODENHEIMER MRN: [id number] Date of Birth: 1940/07/06

## 2018-02-08 ENCOUNTER — Ambulatory Visit: Payer: Medicare Other | Attending: Physician Assistant | Admitting: Physical Therapy

## 2018-02-08 DIAGNOSIS — M6281 Muscle weakness (generalized): Secondary | ICD-10-CM

## 2018-02-08 DIAGNOSIS — R293 Abnormal posture: Secondary | ICD-10-CM | POA: Diagnosis not present

## 2018-02-09 ENCOUNTER — Encounter: Payer: Self-pay | Admitting: Physical Therapy

## 2018-02-09 NOTE — Patient Instructions (Signed)
Seated Gentle Upper Trapezius Stretch reps: 3 hold: 20 sec  daily: 2 weekly: 7   Exercise image step 1   Exercise image step 2 Setup  Begin sitting upright on a table grasping the edge with one hand. Movement  Turn your head toward the side with your straight arm, then bend your neck sideways to your opposite shoulder. You should feel a stretch in the side of your neck and upper back. Tip  Make sure to keep your back straight during the exercise. Gentle Levator Scapulae Stretch sets: 3 hold: 20 sec daily: 2 weekly: 7   Exercise image step 1   Exercise image step 2 Prior to looking at left arm hook the right wrist with left arm to set the shoulder. Keep opposite shoulder relaxed while performing stretch   Setup  Begin sitting upright in a chair, grasping the edge with one hand. Movement  Rotate your head to the side opposite your anchored arm, then tuck your chin towards your chest. You should feel a stretch on the back of your neck and above your shoulder blade. Tip  Make sure to keep your back straight during the exercise.

## 2018-02-09 NOTE — Therapy (Addendum)
[facility] Outpatient Rehabilitation [facility] [address] [place], [place], [address] Phone: [phone]   Fax:  [phone]  Physical Therapy Treatment  Patient Details  Name: [name] MRN: [id number] Date of Birth: [birth year] Referring Provider (PT): Sherron Ales PA   Encounter Date: 02/08/2018  PT End of Session - 02/09/18 0749    Visit Number  3    Number of Visits  12    Date for PT Re-Evaluation  03/08/18    Authorization Type  Medicare progress note at 10 visits kx at 15     PT Start Time  1555    PT Stop Time  1650    PT Time Calculation (min)  55 min    Activity Tolerance  Patient tolerated treatment well    Behavior During Therapy  [facility] for tasks assessed/performed       Past Medical History:  Diagnosis Date  . Compression fracture    T9  . COPD (chronic obstructive pulmonary disease) (HCC)   . Coronary artery disease 08/14/2009   a. 08/2009: Ant MI c/b v-fib arrest, s/p PTCA/BMS to LAD. (STENT Placement); b. 2013 Myoview: no ischemia; c. 11/2012 Cath: patent mLAD stent, RI 70 (stable), otw nonobs dzs, EF 65%;  d. 07/2016 Lexiscan MV: EF 56%, no ischemia/infarct.  Marland Kitchen GERD (gastroesophageal reflux disease)   . HTN (hypertension)   . Hyperlipidemia   . Ischemic cardiomyopathy    a. EF 40-45% in 08/2009 at time of MI. b. Improved to 55-60% by June 2011; c. 03/2016 Echo: EF 55-60%, no rwma, Gr1 DD, mild LVH.  . Osteoarthritis   . Osteopenia   . PONV (postoperative nausea and vomiting)   . Postoperative groin pseudoaneurysm    a. 04/2010 following diagnostic cardiac cath, s/p compression.  . Rib fractures    left sided second and sixth rib   . Shingles   . TIA (transient ischemic attack)    a. 03/2016 MRI/MRA negative/unremarkable.  . Ventricular fibrillation (HCC)    a. 08/2009: due to anterior MI.    Past Surgical History:  Procedure Laterality Date  . APPENDECTOMY    . BACK SURGERY    . LEFT HEART CATHETERIZATION WITH CORONARY ANGIOGRAM  N/A 11/16/2012   Procedure: LEFT HEART CATHETERIZATION WITH CORONARY ANGIOGRAM;  Surgeon: Kathleene Hazel, MD;  Location: [facility] CATH LAB;  Service: Cardiovascular;  Laterality: N/A;  . LEG SURGERY     R low  . ORIF HIP FRACTURE    . TONSILLECTOMY      There were no vitals filed for this visit.  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 Pain in R eg is 2/10. Had mild soreness after last vitit but didn't last long.     Currently in Pain?  No/denies   denies pain in back or neck   Pain Score  0-No pain    Pain Location  Neck    Pain Orientation  Right    Pain Descriptors / Indicators  Tightness    Pain Type  Acute pain    Pain Onset  More than a month ago    Pain Frequency  Intermittent    Aggravating Factors   standing, exercises                        [facility] Adult PT Treatment/Exercise - 02/09/18  0001      Lumbar Exercises: Aerobic   Nustep  L2 5 min      Lumbar Exercises: Supine   Other Supine Lumbar Exercises  decompression series ; head presses 1x5 3 sec holds; shoulder presses 1x5 3 sec holds; leg lengthener Bil 1x5 3 sec holds ; leg press 1x5 3sec holds bil ; mod cueing for technique with head press and leg lengthener ; ab sets with breathing 1x5; ab sets with ball squeezes 2x10 increased tightness in R side of neck       Modalities   Modalities  Moist Heat      Moist Heat Therapy   Number Minutes Moist Heat  10 Minutes    Moist Heat Location  Cervical   thoracic spine     Manual Therapy   Manual Therapy  Soft tissue mobilization    Manual therapy comments  trigger point release to upper trap and levator     Soft tissue mobilization  soft tissue mobilzation to bil upper traps       Neck Exercises: Stretches   Upper Trapezius Stretch  2 reps;30 seconds    Levator Stretch  2 reps;30 seconds             PT Education -  02/09/18 0851    Education Details  HEP; heat vs. Ice ; alternating carrying bags on R shoulder    Person(s) Educated  Patient    Methods  Explanation;Demonstration;Handout;Verbal cues;Tactile cues    Comprehension  Verbalized understanding;Returned demonstration;Verbal cues required       PT Short Term Goals - 02/09/18 0805      PT SHORT TERM GOAL #1   Title  Patient will be independnet with basic HEP for postrue     Time  3    Period  Weeks    Status  On-going      PT SHORT TERM GOAL #2   Title  Patient will demsontrate 5/5 gross bilateral UE strength     Time  3    Period  Weeks    Status  On-going      PT SHORT TERM GOAL #3   Title  Patient will reach behind her head with right UE without pain     Time  3    Period  Weeks    Status  On-going        PT Long Term Goals - 02/09/18 0805      PT LONG TERM GOAL #1   Title  Patient will be indepdnent with a complete osteoperosis program with potentail progression back to the gym     Time  6    Period  Weeks    Status  On-going      PT LONG TERM GOAL #2   Title  Patient will sit with improved posture without cuing     Time  6    Period  Weeks    Status  On-going      PT LONG TERM GOAL #3   Title  Patient will demonstrate a  45% limitation on FOTO     Time  6    Period  Weeks    Status  On-going            Plan - 02/09/18 0750    Clinical Impression Statement  Pt tolerated treatment well. Patient able to do 5 minutes on Nu-step without any SOB. Pt reported increased tightness in right upper trap with light core stabilization and decompression  series. Manual therapy focused on soft tissue mobilization and trigger point relase to upper trap and levator scapula. Patient reported decreased tightness following manual therapy, self stretching, and heat to cervical and thoracic region.     Clinical Presentation  Evolving    Clinical Decision Making  Moderate    Rehab Potential  Good    PT Frequency  2x / week    PT  Duration  6 weeks    PT Treatment/Interventions  ADLs/Self Care Home Management;Cryotherapy;Electrical Stimulation;Neuromuscular re-education;Patient/family education;Manual techniques;Passive range of motion;Splinting;Taping    PT Next Visit Plan  body mechanics/ osteoporosis handout; consider light LE loading activity such as standing 3 way hip and heel raises; consdier bilateral abduction; Consider open book stretching if tolerated. Add the nu-step; would like to eventually progress to gym.     PT Home Exercise Plan  bilteral ER; shoulder flexion with band abdcution; scap retraction; shoulder extension, standing 3 way hip; decompression series; upper trap stretch     Consulted and Agree with Plan of Care  Patient       Patient will benefit from skilled therapeutic intervention in order to improve the following deficits and impairments:  Improper body mechanics, Postural dysfunction, Decreased range of motion, Decreased activity tolerance, Decreased strength, Impaired UE functional use  Visit Diagnosis: Abnormal posture  Muscle weakness (generalized)     Problem List Patient Active Problem List   Diagnosis Date Noted  . CAP (community acquired pneumonia) 01/03/2018  . Pneumonia of left lower lobe due to infectious organism (HCC) 12/09/2017  . TIA (transient ischemic attack) 03/18/2016  . Right sided weakness   . Stroke (HCC) 03/16/2016  . Stroke-like symptoms   . History of placement of stent in LAD coronary artery   . Dyspnea on exertion 08/08/2015  . Preoperative clearance 05/09/2015  . Atypical chest pain 03/21/2015  . Acute UTI 03/21/2015  . Hydronephrosis, right 03/21/2015  . Proteinuria 03/21/2015  . HTN (hypertension) 03/21/2015  . Chronic diastolic heart failure (HCC) 03/21/2015  . 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 Unstable angina (HCC) 11/16/2012  . Palpitations 07/22/2010  . COPD GOLD II 05/30/2010  . Hyperlipidemia 08/27/2009  . AMI  08/27/2009  . CAD (coronary artery disease) 08/27/2009  . Ventricular fibrillation (HCC) 08/14/2009  . COMPRESSION FRACTURE, LUMBAR VERTEBRAE 12/21/2008  . SPINAL STENOSIS, LUMBAR 08/31/2008  . Macrocytic anemia 08/30/2008  . ABDOMINAL MASS 07/11/2008  . Shortness of breath 07/10/2008  . UNSPECIFIED VITAMIN D DEFICIENCY 03/09/2008  . GERD 03/09/2008  . OSTEOARTHRITIS 11/21/2007  . OSTEOPENIA 11/21/2007   Lorayne Bender PT DPT  02/09/2018  Donzetta Kohut  SPT 02/09/2018, 8:53 AM   During this treatment session, the therapist was present, participating in and directing the treatment.   [facility] Outpatient Rehabilitation [facility] [address] [place], [place], [address] Phone: [phone]   Fax:  [phone]  Name: [name] MRN: [id number] Date of Birth: [birth year]

## 2018-02-15 ENCOUNTER — Ambulatory Visit: Payer: Medicare Other | Admitting: Physical Therapy

## 2018-02-22 ENCOUNTER — Encounter: Payer: Medicare Other | Admitting: Physical Therapy

## 2018-02-24 ENCOUNTER — Ambulatory Visit: Payer: Medicare Other | Admitting: Physical Therapy

## 2018-02-24 ENCOUNTER — Encounter: Payer: Self-pay | Admitting: Physical Therapy

## 2018-02-24 DIAGNOSIS — M6281 Muscle weakness (generalized): Secondary | ICD-10-CM

## 2018-02-24 DIAGNOSIS — R293 Abnormal posture: Secondary | ICD-10-CM | POA: Diagnosis not present

## 2018-02-24 NOTE — Patient Instructions (Signed)
Standing Hip Abduction with Counter Support reps: 10 sets: 3 daily: 1 weekly: 7   Exercise image step 1   Exercise image step 2  Setup  Begin in a standing upright position with your hands resting on a counter. Movement  Lift your leg out to your side, then return to the starting position and repeat.  Tip  Make sure to keep your moving leg straight and do not bend or rotate your trunk during the exercise. Use the counter to help you balance as needed. Standing Hip Extension with Counter Support reps: 10 sets: 3 daily: 1 weekly: 7   Exercise image step 1   Exercise image step 2  Setup  Begin in a standing upright position with your hands resting on a counter. Movement  Tighten your buttock muscles and slowly lift your leg backward. Return to the starting position and repeat. Tip  Make sure to keep your moving leg straight and keep your shoulders and hips facing forward during the exercise. Use the counter to help you balance as needed. Disclaimer: This program provides exercises related to your condition that you can perform at home. As there is a risk of injury with any activity, use caution when performing exercises. If you experience any pain or discomfort, discontinue the exercises and contact your health care provider.  Login URL: [email or web address] . Access Code: [email or web address] . Date printed: 02/24/2018 Page 2  Heel rises with counter support reps: 10 sets: 3 daily: 1 weekly: 7   Exercise image step 1   Exercise image step 2  Setup  Begin in a standing upright position with your hands resting on a counter in front of you. Movement  Slowly raise your heels off the ground, hold briefly, then lower them back down and repeat. Tip  Make sure to maintain an upright posture and use the counter to help you balance as needed. Do not let your ankles rotate inward or outward. Supine Hip Adduction Isometric with Ball reps: 10 sets: 3 daily: 1 weekly: 7   Exercise image step 1    Exercise image step 2  Setup  Begin lying on your back with your legs bent, feet resting on the floor, and a soft ball positioned between your knees.  Movement  Squeeze your knees together into the ball, then release and repeat.  Tip  Make sure to keep your back flat against the floor during the exercise.

## 2018-02-24 NOTE — Therapy (Addendum)
[place], [place], [address] Phone: [phone]   Fax:  [phone]  Physical Therapy Treatment  Patient Details  Name: [name] MRN: [id number] Date of Birth: 1940/09/10 Referring Provider (PT): Hazel Sams PA   Encounter Date: 02/24/2018  PT End of Session - 02/24/18 1742    Visit Number  4    Number of Visits  12    Date for PT Re-Evaluation  03/08/18    Authorization Type  Medicare progress note at 10 visits kx at 28     PT Start Time  1415    PT Stop Time  1510    PT Time Calculation (min)  55 min    Activity Tolerance  Patient tolerated treatment well    Behavior During Therapy  [facility] for tasks assessed/performed       Past Medical History:  Diagnosis Date  . Compression fracture    T9  . COPD (chronic obstructive pulmonary disease) ([place])   . Coronary artery disease 08/14/2009   a. 08/2009: Ant MI c/b v-fib arrest, s/p PTCA/BMS to LAD. (STENT Placement); b. 2013 Myoview: no ischemia; c. 11/2012 Cath: patent mLAD stent, RI 70 (stable), otw nonobs dzs, EF 65%;  d. 07/2016 Lexiscan MV: EF 56%, no ischemia/infarct.  Marland Kitchen GERD (gastroesophageal reflux disease)   . HTN (hypertension)   . Hyperlipidemia   . Ischemic cardiomyopathy    a. EF 40-45% in 08/2009 at time of MI. b. Improved to 55-60% by June 2011; c. 03/2016 Echo: EF 55-60%, no rwma, Gr1 DD, mild LVH.  . Osteoarthritis   . Osteopenia   . PONV (postoperative nausea and vomiting)   . Postoperative groin pseudoaneurysm    a. 04/2010 following diagnostic cardiac cath, s/p compression.  . Rib fractures    left sided second and sixth rib   . Shingles   . TIA (transient ischemic attack)    a. 03/2016 MRI/MRA negative/unremarkable.  . Ventricular fibrillation ([place])    a. 08/2009: due to anterior MI.    Past Surgical History:  Procedure Laterality Date  . APPENDECTOMY    . BACK SURGERY    . LEFT HEART CATHETERIZATION WITH CORONARY ANGIOGRAM  N/A 11/16/2012   Procedure: LEFT HEART CATHETERIZATION WITH CORONARY ANGIOGRAM;  Surgeon: Burnell Blanks, MD;  Location: [facility] CATH LAB;  Service: Cardiovascular;  Laterality: N/A;  . LEG SURGERY     R low  . ORIF HIP FRACTURE    . TONSILLECTOMY      There were no vitals filed for this visit.  Subjective Assessment - 02/24/18 1739    Subjective  Had to cancel last week because I was sick. Lymph nodes swollen and upper trap increased neck pain so stopped performing that. No pain today.     Currently in Pain?  No/denies    Pain Onset  More than a month ago    Pain Frequency  Intermittent    Aggravating Factors   standing, exercises                        [facility] Adult PT Treatment/Exercise - 02/24/18 0001      Lumbar Exercises: Aerobic   Nustep  L1 5 min      Lumbar Exercises: Standing   Other Standing Lumbar Exercises  Standing 3 way hip with cues for positioning and form 2x5 Bil ; heel toe raises x20; FWD step up on 4" step x10; hip marching 1x10 Bil  Lumbar Exercises: Supine   Other Supine Lumbar Exercises  decompression series ; head presses 1x5 5 sec holds; shoulder presses 1x5 5 sec holds; leg lengthener Bil 1x5 5 sec holds ; leg press 1x5 5sec holds bil ; mod cueing for technique with head press and leg lengthener ; ab sets with ball squeezes 2x10 with cues to prevent holding breath Piriformis stretch 2x20sec holds       Moist Heat Therapy   Number Minutes Moist Heat  10 Minutes    Moist Heat Location  Cervical;Lumbar Spine             PT Education - 02/24/18 1740    Education Details  New HEP and alternating exercises given thus far throughout the week; symptom manangement     Person(s) Educated  Patient    Methods  Explanation;Demonstration;Tactile cues;Verbal cues    Comprehension  Verbalized understanding;Returned demonstration;Verbal cues required       PT Short Term Goals - 02/24/18 1746      PT SHORT TERM GOAL #1   Title  Patient  will be independnet with basic HEP for postrue     Baseline  Brings all exercise handouts with her to therapy and discusses HEP at beginning of visits     Time  3    Period  Weeks    Status  Partially Met      PT SHORT TERM GOAL #2   Title  Patient will demsontrate 5/5 gross bilateral UE strength     Baseline  unable to assess     Time  3    Period  Weeks    Status  On-going      PT SHORT TERM GOAL #3   Title  Patient will reach behind her head with right UE without pain     Baseline  pt reports no pain with movements since getting over sickness     Time  3    Period  Weeks    Status  On-going        PT Long Term Goals - 02/24/18 1747      PT LONG TERM GOAL #1   Title  Patient will be indepdnent with a complete osteoperosis program with potentail progression back to the gym     Baseline  given additional LE strengthening exercises today     Time  6    Period  Weeks    Status  On-going      PT LONG TERM GOAL #2   Title  Patient will sit with improved posture without cuing     Baseline  no seated exercises performed today     Time  6    Period  Weeks    Status  On-going      PT LONG TERM GOAL #3   Title  Patient will demonstrate a  45% limitation on FOTO     Baseline  unable to assess    Time  6    Period  Weeks    Status  Unable to assess            Plan - 02/24/18 1742    Clinical Impression Statement  Focus of therapy on core and hip strengthening at todays session. Pt with min cues for technique with standing exercises, encouraged to perform at kitchen sink with Bil UE support to prevent LOB. Disccused POC going forward and possible discharge at the next visit.    Clinical Presentation  Evolving    Clinical  Decision Making  Moderate    Rehab Potential  Good    PT Frequency  2x / week    PT Duration  6 weeks    PT Treatment/Interventions  ADLs/Self Care Home Management;Cryotherapy;Electrical Stimulation;Neuromuscular re-education;Patient/family  education;Manual techniques;Passive range of motion;Splinting;Taping    PT Next Visit Plan  body mechanics/ osteoporosis handout;  consdier bilateral abduction; Consider open book stretching if tolerated. Add the nu-step; would like to eventually progress to gym.     PT Home Exercise Plan  bilteral ER; shoulder flexion with band abdcution; scap retraction; shoulder extension, standing 3 way hip; decompression series; upper trap stretch     Consulted and Agree with Plan of Care  Patient       Patient will benefit from skilled therapeutic intervention in order to improve the following deficits and impairments:  Improper body mechanics, Postural dysfunction, Decreased range of motion, Decreased activity tolerance, Decreased strength, Impaired UE functional use  Visit Diagnosis: Abnormal posture  Muscle weakness (generalized)     Problem List Patient Active Problem List   Diagnosis Date Noted  . CAP (community acquired pneumonia) 01/03/2018  . Pneumonia of left lower lobe due to infectious organism ([place]) 12/09/2017  . TIA (transient ischemic attack) 03/18/2016  . Right sided weakness   . Stroke ([place]) 03/16/2016  . Stroke-like symptoms   . History of placement of stent in LAD coronary artery   . Dyspnea on exertion 08/08/2015  . Preoperative clearance 05/09/2015  . Atypical chest pain 03/21/2015  . Acute UTI 03/21/2015  . Hydronephrosis, right 03/21/2015  . Proteinuria 03/21/2015  . HTN (hypertension) 03/21/2015  . Chronic diastolic heart failure ([place]) 03/21/2015  . 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 Unstable angina ([place]) 11/16/2012  . Palpitations 07/22/2010  . COPD GOLD II 05/30/2010  . Hyperlipidemia 08/27/2009  . AMI 08/27/2009  . CAD (coronary artery disease) 08/27/2009  . Ventricular fibrillation ([place]) 08/14/2009  . COMPRESSION FRACTURE, LUMBAR VERTEBRAE 12/21/2008  . SPINAL STENOSIS, LUMBAR 08/31/2008  . Macrocytic anemia 08/30/2008  . ABDOMINAL  MASS 07/11/2008  . Shortness of breath 07/10/2008  . UNSPECIFIED VITAMIN D DEFICIENCY 03/09/2008  . GERD 03/09/2008  . OSTEOARTHRITIS 11/21/2007  . OSTEOPENIA 11/21/2007    Carolyne Littles PT DPT  02/25/2018   Einar Crow DPT 02/24/2018, 5:52 PM  During this treatment session, the therapist was present, participating in and directing the treatment.   [place], [place], [address] Phone: [phone]   Fax:  [phone]  Name: [name] MRN: [id number] Date of Birth: 1940/11/29

## 2018-02-25 ENCOUNTER — Encounter: Payer: Self-pay | Admitting: Physical Therapy

## 2018-03-14 ENCOUNTER — Encounter: Payer: Self-pay | Admitting: Physical Therapy

## 2018-03-14 ENCOUNTER — Ambulatory Visit: Payer: Medicare Other | Attending: Physician Assistant | Admitting: Physical Therapy

## 2018-03-14 DIAGNOSIS — M6281 Muscle weakness (generalized): Secondary | ICD-10-CM | POA: Diagnosis present

## 2018-03-14 DIAGNOSIS — R293 Abnormal posture: Secondary | ICD-10-CM | POA: Insufficient documentation

## 2018-03-14 NOTE — Therapy (Addendum)
[place], [place], [address] Phone: [phone]   Fax:  [phone]  Physical Therapy Treatment/Discharge   Patient Details  Name: CHIARA COLTRIN MRN: [id number] Date of Birth: [birth year] Referring Provider (PT): Hazel Sams PA   Encounter Date: 03/14/2018  PT End of Session - 03/14/18 1344    Visit Number  5    Number of Visits  12    Date for PT Re-Evaluation  03/08/18    Authorization Type  Medicare progress note at 10 visits kx at 10     PT Start Time  9024    PT Stop Time  1415    PT Time Calculation (min)  38 min    Activity Tolerance  Patient tolerated treatment well    Behavior During Therapy  [facility] for tasks assessed/performed       Past Medical History:  Diagnosis Date  . Compression fracture    T9  . COPD (chronic obstructive pulmonary disease) ([place])   . Coronary artery disease 08/14/2009   a. 08/2009: Ant MI c/b v-fib arrest, s/p PTCA/BMS to LAD. (STENT Placement); b. 2013 Myoview: no ischemia; c. 11/2012 Cath: patent mLAD stent, RI 70 (stable), otw nonobs dzs, EF 65%;  d. 07/2016 Lexiscan MV: EF 56%, no ischemia/infarct.  Marland Kitchen GERD (gastroesophageal reflux disease)   . HTN (hypertension)   . Hyperlipidemia   . Ischemic cardiomyopathy    a. EF 40-45% in 08/2009 at time of MI. b. Improved to 55-60% by June 2011; c. 03/2016 Echo: EF 55-60%, no rwma, Gr1 DD, mild LVH.  . Osteoarthritis   . Osteopenia   . PONV (postoperative nausea and vomiting)   . Postoperative groin pseudoaneurysm    a. 04/2010 following diagnostic cardiac cath, s/p compression.  . Rib fractures    left sided second and sixth rib   . Shingles   . TIA (transient ischemic attack)    a. 03/2016 MRI/MRA negative/unremarkable.  . Ventricular fibrillation ([place])    a. 08/2009: due to anterior MI.    Past Surgical History:  Procedure Laterality Date  . APPENDECTOMY    . BACK SURGERY    . LEFT HEART CATHETERIZATION WITH CORONARY  ANGIOGRAM N/A 11/16/2012   Procedure: LEFT HEART CATHETERIZATION WITH CORONARY ANGIOGRAM;  Surgeon: Burnell Blanks, MD;  Location: [facility] CATH LAB;  Service: Cardiovascular;  Laterality: N/A;  . LEG SURGERY     R low  . ORIF HIP FRACTURE    . TONSILLECTOMY      There were no vitals filed for this visit.  Subjective Assessment - 03/14/18 1342    Subjective  Patient reports she has not been doing her exercises as much as she should but she has been doing some. She i ahving some minor digestive problems but overall she is doing OK.     Currently in Pain?  No/denies         [facility] PT Assessment - 03/14/18 0001      AROM   Lumbar - Right Rotation  no limitation       Strength   Right Shoulder Flexion  5/5    Right Shoulder Internal Rotation  5/5    Right Shoulder External Rotation  5/5    Left Shoulder Flexion  5/5    Left Shoulder Internal Rotation  5/5    Left Shoulder External Rotation  5/5      Palpation   Palpation comment  spasming from time to time but nothing today  [place] Adult PT Treatment/Exercise - 03/14/18 0001      Lumbar Exercises: Aerobic   Nustep  L1 5 min      Lumbar Exercises: Standing   Row  15 reps;Theraband    Theraband Level (Row)  Level 2 (Red)    Shoulder Extension  15 reps    Theraband Level (Shoulder Extension)  Level 2 (Red)      Lumbar Exercises: Seated   Other Seated Lumbar Exercises  seated bilateral UE band red 2x10; Seated UE abduction 2x10      Lumbar Exercises: Supine   Other Supine Lumbar Exercises  decompression series ; head presses 1x5 5 sec holds; shoulder presses 1x5 5 sec holds; leg lengthener Bil 1x5 5 sec holds ; leg press 1x5 5sec holds bil ; mod cueing for technique with head press and leg lengthener ; ab sets with ball squeezes 2x10 with cues to prevent holding breath             PT Education - 03/14/18 1344    Education Details  reviewed gym activity going forward     Person(s)  Educated  Patient    Methods  Explanation;Demonstration;Tactile cues;Verbal cues    Comprehension  Verbalized understanding;Returned demonstration;Verbal cues required;Tactile cues required       PT Short Term Goals - 03/14/18 1347      PT SHORT TERM GOAL #1   Title  Patient will be independnet with basic HEP for postrue     Baseline  Indepdnent with HEP     Time  3    Period  Weeks    Status  Achieved      PT SHORT TERM GOAL #2   Title  Patient will demsontrate 5/5 gross bilateral UE strength     Baseline  5/5 today     Time  3    Period  Weeks    Status  Achieved      PT SHORT TERM GOAL #3   Title  Patient will reach behind her head with right UE without pain     Baseline  can reach behind her head without pain     Time  3    Period  Weeks    Status  Achieved        PT Long Term Goals - 03/14/18 1348      PT LONG TERM GOAL #1   Title  Patient will be indepdnent with a complete osteoperosis program with potentail progression back to the gym     Baseline  implimented gym exercises today     Time  6    Period  Weeks    Status  Achieved      PT LONG TERM GOAL #2   Title  Patient will sit with improved posture without cuing     Baseline  improved posture noted lessslumped posture             Plan - 03/14/18 1356    Clinical Impression Statement  Patient feels comfortable with her exercises. Therapy reviewed decompression position with a progression into UE and LE exercises. Patient expressed some intrest in going to the gym. Therapy reviewed how to progress to gym activity. Therapy also advanced her to a red banc. Patient has reached all goals for therapy D/C to HEP.     Clinical Presentation  Evolving    Clinical Decision Making  Moderate    Rehab Potential  Good    PT Frequency  2x / week  PT Duration  6 weeks    PT Treatment/Interventions  ADLs/Self Care Home Management;Cryotherapy;Electrical Stimulation;Neuromuscular re-education;Patient/family  education;Manual techniques;Passive range of motion;Splinting;Taping    PT Next Visit Plan  body mechanics/ osteoporosis handout;  consdier bilateral abduction; Consider open book stretching if tolerated. Add the nu-step; would like to eventually progress to gym.     PT Home Exercise Plan  bilteral ER; shoulder flexion with band abdcution; scap retraction; shoulder extension, standing 3 way hip; decompression series; upper trap stretch     Consulted and Agree with Plan of Care  Patient       Patient will benefit from skilled therapeutic intervention in order to improve the following deficits and impairments:  Improper body mechanics, Postural dysfunction, Decreased range of motion, Decreased activity tolerance, Decreased strength, Impaired UE functional use  Visit Diagnosis: Abnormal posture  Muscle weakness (generalized)  PHYSICAL THERAPY DISCHARGE SUMMARY  Visits from Start of Care: 5 Current functional level related to goals / functional outcomes: Has an HEP for osteoporosis    Remaining deficits: Pain at times     Education / Equipment: HEP   Plan: Patient agrees to discharge.  Patient goals were met. Patient is being discharged due to meeting the stated rehab goals.  ?????       Problem List Patient Active Problem List   Diagnosis Date Noted  . CAP (community acquired pneumonia) 01/03/2018  . Pneumonia of left lower lobe due to infectious organism ([place]) 12/09/2017  . TIA (transient ischemic attack) 03/18/2016  . Right sided weakness   . Stroke ([place]) 03/16/2016  . Stroke-like symptoms   . History of placement of stent in LAD coronary artery   . Dyspnea on exertion 08/08/2015  . Preoperative clearance 05/09/2015  . Atypical chest pain 03/21/2015  . Acute UTI 03/21/2015  . Hydronephrosis, right 03/21/2015  . Proteinuria 03/21/2015  . HTN (hypertension) 03/21/2015  . Chronic diastolic heart failure ([place]) 03/21/2015  . Stenosis of ureteropelvic junction (UPJ)  03/21/2015  . Unstable angina ([place]) 11/16/2012  . Palpitations 07/22/2010  . COPD GOLD II 05/30/2010  . Hyperlipidemia 08/27/2009  . AMI 08/27/2009  . CAD (coronary artery disease) 08/27/2009  . Ventricular fibrillation ([place]) 08/14/2009  . COMPRESSION FRACTURE, LUMBAR VERTEBRAE 12/21/2008  . SPINAL STENOSIS, LUMBAR 08/31/2008  . Macrocytic anemia 08/30/2008  . ABDOMINAL MASS 07/11/2008  . Shortness of breath 07/10/2008  . UNSPECIFIED VITAMIN D DEFICIENCY 03/09/2008  . GERD 03/09/2008  . OSTEOARTHRITIS 11/21/2007  . OSTEOPENIA 11/21/2007    Carney Living PT DPT  03/14/2018, 4:52 PM  [facility] [address] [place], [place], [address] Phone: [phone]   Fax:  [phone]  Name: BURGANDY HACKWORTH MRN: [id number] Date of Birth: [birth year]

## 2018-03-18 NOTE — Progress Notes (Signed)
Office Visit    Patient Name: [name] Date of Encounter: 03/22/2018  Primary Care Provider:  Burton Apley, MD Primary Cardiologist:  P. [place], MD   Chief Complaint    77 year old female with a prior history of coronary artery disease status post anterior MI and LAD bare-metal stenting complicated by VF arrest in 2011, hypertension, hyperlipidemia, GERD, TIA,who presents for follow-up.  Past Medical History    Past Medical History:  Diagnosis Date  . Compression fracture    T9  . COPD (chronic obstructive pulmonary disease) ([facility])   . Coronary artery disease 08/14/2009   a. 08/2009: Ant MI c/b v-fib arrest, s/p PTCA/BMS to LAD. (STENT Placement); b. 2013 Myoview: no ischemia; c. 11/2012 Cath: patent mLAD stent, RI 70 (stable), otw nonobs dzs, EF 65%;  d. 07/2016 Lexiscan MV: EF 56%, no ischemia/infarct.  Marland Kitchen GERD (gastroesophageal reflux disease)   . HTN (hypertension)   . Hyperlipidemia   . Ischemic cardiomyopathy    a. EF 40-45% in 08/2009 at time of MI. b. Improved to 55-60% by June 2011; c. 03/2016 Echo: EF 55-60%, no rwma, Gr1 DD, mild LVH.  . Osteoarthritis   . Osteopenia   . PONV (postoperative nausea and vomiting)   . Postoperative groin pseudoaneurysm    a. 04/2010 following diagnostic cardiac cath, s/p compression.  . Rib fractures    left sided second and sixth rib   . Shingles   . TIA (transient ischemic attack)    a. 03/2016 MRI/MRA negative/unremarkable.  . Ventricular fibrillation ([facility])    a. 08/2009: due to anterior MI.   Past Surgical History:  Procedure Laterality Date  . APPENDECTOMY    . BACK SURGERY    . LEFT HEART CATHETERIZATION WITH CORONARY ANGIOGRAM N/A 11/16/2012   Procedure: LEFT HEART CATHETERIZATION WITH CORONARY ANGIOGRAM;  Surgeon: Kathleene Hazel, MD;  Location: [facility] CATH LAB;  Service: Cardiovascular;  Laterality: N/A;  . LEG SURGERY     R low  . ORIF HIP FRACTURE    . TONSILLECTOMY      Allergies  Allergies  Allergen  Reactions  . Codeine Nausea And Vomiting  . Imdur [Isosorbide Dinitrate] Nausea Only    Headache Headache  . Other Nausea And Vomiting  . Sulfonamide Derivatives Nausea And Vomiting    History of Present Illness    77 year old female with the above complex past medical history. She is status post anterior MI and VF arrest in May 2011 with bare-metal stenting of the LAD. EF was 40-45% at that time but subsequently recovered.  Her last catheterization took place in August 2014 which revealed stable 70% stenosis within the ramus intermedius and a patent LAD stent. EF was 65% at that time. Other history includes hypertension, HL, ICM with subsequent Normalization of LV function, TIA in December 2017.  In April 2018 she had a  [employer] which showed no evidence of ischemia or infarct with normal LV function.   In October she was seen in the ED with right sided weakness and facial droop. Symptoms resolved. CT and MRI did not show any acute change and she was DC home. MRI did show microhemorrhages due to HTN.  On follow up today she is doing well from a cardiac standpoint. She denies any chest pain. She does have dyspnea and is followed  by Dr. Sherene Sires with GOLD stage 2 COPD. On inhaler therapy now. States she did Pulmonary Rehab. She also received outpatient PT for her spine. Overall feels well.  Home Medications    Allergies as of 03/22/2018      Reactions   Codeine Nausea And Vomiting   Imdur [isosorbide Dinitrate] Nausea Only   Headache Headache   Other Nausea And Vomiting   Sulfonamide Derivatives Nausea And Vomiting      Medication List       Accurate as of March 22, 2018 11:51 AM. Always use your most recent med list.        aspirin EC 81 MG tablet Take 1 tablet (81 mg total) daily by mouth.   calcium carbonate 600 MG Tabs tablet Commonly known as:  OS-CAL Take 600 mg by mouth 2 (two) times daily with a meal.   Glycopyrrolate-Formoterol 9-4.8 MCG/ACT  Aero Commonly known as:  BEVESPI AEROSPHERE Inhale 2 puffs into the lungs 2 (two) times daily.   loratadine 10 MG tablet Commonly known as:  CLARITIN Take 10 mg by mouth daily as needed.   losartan 50 MG tablet Commonly known as:  COZAAR TAKE 1 TABLET (50 MG TOTAL) DAILY BY MOUTH.   metoprolol succinate 25 MG 24 hr tablet Commonly known as:  TOPROL-XL Take 1 tablet by mouth daily.   nitroGLYCERIN 0.4 MG SL tablet Commonly known as:  NITROSTAT Place 1 tablet (0.4 mg total) under the tongue every 5 (five) minutes as needed. For chest pain   pantoprazole 40 MG tablet Commonly known as:  PROTONIX Take 1 tablet by mouth daily.   RECLAST IV Inject into the vein. Once Yearly   rosuvastatin 20 MG tablet Commonly known as:  CRESTOR Take 1 tablet (20 mg total) by mouth at bedtime.   traMADol 50 MG tablet Commonly known as:  ULTRAM Take 50 mg by mouth every 6 (six) hours as needed.   Vitamin D3 25 MCG (1000 UT) Caps Take 1,000 Units by mouth daily.        Review of Systems    As noted in HPI. All other systems reviewed and are otherwise negative except as noted above.  Physical Exam    VS:  BP 132/76   Pulse 68   Ht 5' 5.5" (1.664 m)   Wt 170 lb 9.6 oz (77.4 kg)   BMI 27.96 kg/m  , BMI Body mass index is 27.96 kg/m. GENERAL:  Well appearing overweight WF in NAD HEENT:  PERRL, EOMI, sclera are clear. Oropharynx is clear. NECK:  No jugular venous distention, carotid upstroke brisk and symmetric, no bruits, no thyromegaly or adenopathy LUNGS:  Clear to auscultation bilaterally CHEST:  Unremarkable HEART:  RRR,  PMI not displaced or sustained,S1 and S2 within normal limits, no S3, no S4: no clicks, no rubs, no murmurs ABD:  Soft, nontender. BS +, no masses or bruits. No hepatomegaly, no splenomegaly EXT:  2 + pulses throughout, no edema, no cyanosis no clubbing SKIN:  Warm and dry.  No rashes NEURO:  Alert and oriented x 3. Cranial nerves II through XII  intact. PSYCH:  Cognitively intact        Accessory Clinical Findings     Laboratory data:  Lab Results  Component Value Date   WBC 7.6 01/10/2018   HGB 12.2 01/10/2018   HCT 36.0 01/10/2018   PLT 238 01/10/2018   GLUCOSE 76 01/10/2018   CHOL 158 03/17/2016   TRIG 72 03/17/2016   HDL 82 03/17/2016   LDLDIRECT 134.9 11/21/2007   LDLCALC 62 03/17/2016   ALT 17 01/10/2018   AST 26 01/10/2018   NA 135 01/10/2018   K  4.3 01/10/2018   CL 99 01/10/2018   CREATININE 0.70 01/10/2018   BUN 10 01/10/2018   CO2 24 01/10/2018   TSH 1.72 05/14/2017   INR 1.02 01/10/2018   HGBA1C 5.8 (H) 03/17/2016    Lexiscan Myoview-07/10/2016  The left ventricular ejection fraction is normal (55-65%). Nuclear stress EF: 56%. There was no ST segment deviation noted during stress. The study is normal. This is a low risk study.   Assessment & Plan    1.  Coronary artery disease: s/p anterior MI in 2011 with stenting of the LAD. Normal Myoview study in April 2018. Last Echo in 2017 showed normal LV function. She is asymptomatic. She has some dyspnea more related to COPD.  Continue medical therapy. Encourage increase in aerobic activity.  2. Essential hypertension: Blood pressure is well controlled now.   3. Hyperlipidemia: She is on Crestor. LDL was 62 in December 2017. Will request a copy of lab work from Dr. Su Hiltoberts.   4. COPD. Followed by Dr. Sherene SiresWert.   Follow up in 6 months.  Peter [place]azil[place]Jordan, MD,FACC 03/22/2018, 11:51 AM

## 2018-03-22 ENCOUNTER — Encounter: Payer: Self-pay | Admitting: Cardiology

## 2018-03-22 ENCOUNTER — Ambulatory Visit (INDEPENDENT_AMBULATORY_CARE_PROVIDER_SITE_OTHER): Payer: Medicare Other | Admitting: Cardiology

## 2018-03-22 VITALS — BP 132/76 | HR 68 | Ht 65.5 in | Wt 170.6 lb

## 2018-03-22 DIAGNOSIS — I10 Essential (primary) hypertension: Secondary | ICD-10-CM | POA: Diagnosis not present

## 2018-03-22 DIAGNOSIS — I2510 Atherosclerotic heart disease of native coronary artery without angina pectoris: Secondary | ICD-10-CM | POA: Diagnosis not present

## 2018-03-22 DIAGNOSIS — E7800 Pure hypercholesterolemia, unspecified: Secondary | ICD-10-CM

## 2018-04-27 ENCOUNTER — Other Ambulatory Visit: Payer: Self-pay | Admitting: *Deleted

## 2018-04-28 ENCOUNTER — Other Ambulatory Visit: Payer: Self-pay

## 2018-04-28 MED ORDER — LOSARTAN POTASSIUM 50 MG PO TABS: 50.0000 mg | ORAL_TABLET | Freq: Every day | ORAL | 3 refills | Status: AC

## 2018-06-13 ENCOUNTER — Other Ambulatory Visit: Payer: Self-pay | Admitting: Internal Medicine

## 2018-06-20 ENCOUNTER — Telehealth: Payer: Self-pay | Admitting: Internal Medicine

## 2018-06-20 MED ORDER — GLYCOPYRROLATE-FORMOTEROL 9-4.8 MCG/ACT IN AERO: 2.0000 | INHALATION_SPRAY | Freq: Two times a day (BID) | RESPIRATORY_TRACT | 11 refills | Status: DC

## 2018-06-20 NOTE — Telephone Encounter (Signed)
LMOM for pt that refill for Bevespi was sent to pharmacy as requested.  Nothing further needed at this time.

## 2018-07-04 ENCOUNTER — Ambulatory Visit: Payer: Medicare Other | Admitting: Internal Medicine

## 2018-07-11 ENCOUNTER — Ambulatory Visit
Admission: RE | Admit: 2018-07-11 | Discharge: 2018-07-11 | Disposition: A | Discharge status: Home / Self Care | Payer: Medicare Other | Source: Ambulatory Visit | Attending: Internal Medicine | Admitting: Internal Medicine

## 2018-07-11 ENCOUNTER — Other Ambulatory Visit: Payer: Self-pay | Admitting: Internal Medicine

## 2018-07-11 ENCOUNTER — Other Ambulatory Visit: Payer: Self-pay

## 2018-07-11 DIAGNOSIS — R0781 Pleurodynia: Secondary | ICD-10-CM

## 2018-07-13 NOTE — Progress Notes (Signed)
Virtual Visit via Telephone Note  I connected with [name] on 07/13/18 at  1:30 PM EDT by telephone and verified that I am speaking with the correct person using two identifiers.   I discussed the limitations, risks, security and privacy concerns of performing an evaluation and management service by telephone and the availability of in person appointments. I also discussed with the patient that there may be a patient responsible charge related to this service. The patient expressed understanding and agreed to proceed.  CC: Recent fall   History of Present Illness: Patient is a 78 year old female with a past medical history of osteoporosis, DDD, and osteoarthritis.  She receives Reclast infusions on a yearly basis.  Her last infusion was in April 2019.  She states she fell 2 weeks ago and she fractured a rib.  She had a X-ray that revealed the fracture but no pneumothorax.  She states the pain has started to subside. She is taking ibuprofen and tramadol for pain relief. She has started tapering off of tramadol.  She has intermittent pain in both hands and the right knee joint.  She would like a refill of voltaren gel.    Review of Systems  Constitutional: Positive for malaise/fatigue. Negative for fever.  Eyes: Negative for photophobia, pain, discharge and redness.  Respiratory: Negative for cough, shortness of breath and wheezing.   Cardiovascular: Negative for chest pain and palpitations.  Gastrointestinal: Negative for blood in stool, constipation and diarrhea.  Genitourinary: Negative for dysuria.  Musculoskeletal: Positive for joint pain. Negative for back pain, myalgias and neck pain.  Skin: Negative for rash.  Neurological: Negative for dizziness and headaches.  Psychiatric/Behavioral: Negative for depression. The patient is not nervous/anxious and does not have insomnia.    Observations/Objective:  Physical Exam  Constitutional: She is oriented to person, place, and time.   Neurological: She is alert and oriented to person, place, and time.  Psychiatric: Mood, memory, affect and judgment normal.   Patient reports morning stiffness for 5-10 minutes.   Patient denies nocturnal pain.  Difficulty dressing/grooming: Denies Difficulty climbing stairs: Denies Difficulty getting out of chair: Denies Difficulty using hands for taps, buttons, cutlery, and/or writing: Denies   Assessment and Plan: Visit Diagnoses: Age-related osteoporosis without current pathological fracture -  She was treated w/ [facility] 2012-2014.  She has a hx of vertebral fx, height loss of 4 inches, thoracic kyphosis.T-score -4.2 03/02/17 DEXA.  She restarted on reclast in April 2019.  She is due for her next infusion in May.  She was advised to obtain lab work 1 week prior to the infusion.   Future orders for CBC and CMP were placed today.  She fell 2 weeks ago and had a x-ray that revealed a fractured rib.  She has been taking tramadol and ibuprofen for pain relief.  The pain has started to subside.   She will follow up in 4-5 months.  DDD (degenerative disc disease), lumbar - s/p fusion: She has no discomfort at this time.  Primary osteoarthritis of both hands: She has intermittent pain in both hands.  She denies any joint swelling. She would like a refill of voltaren gel.    Primary osteoarthritis of right knee: She has intermittent right knee discomfort.  A refill of voltaren gel was sent to the pharmacy.   History of COPD: She follows up with Dr. Sherene Sires.     Follow Up Instructions: She will follow up in 4-5 months.   Future orders for  CBC and CMP were placed today. A prescription of voltaren gel was sent to the pharmacy.    I discussed the assessment and treatment plan with the patient. The patient was provided an opportunity to ask questions and all were answered. The patient agreed with the plan and demonstrated an understanding of the instructions.   The patient was advised to call  back or seek an in-person evaluation if the symptoms worsen or if the condition fails to improve as anticipated.  I provided 25 minutes of non-face-to-face time during this encounter. Pollyann SavoyShaili Deveshwar, MD   Scribed by-  Sherron Alesaylor Dale, PA-C

## 2018-07-20 ENCOUNTER — Telehealth (INDEPENDENT_AMBULATORY_CARE_PROVIDER_SITE_OTHER): Payer: Medicare Other | Admitting: Rheumatology

## 2018-07-20 ENCOUNTER — Telehealth: Payer: Self-pay

## 2018-07-20 ENCOUNTER — Other Ambulatory Visit: Payer: Self-pay

## 2018-07-20 ENCOUNTER — Encounter: Payer: Self-pay | Admitting: Rheumatology

## 2018-07-20 DIAGNOSIS — M19041 Primary osteoarthritis, right hand: Secondary | ICD-10-CM

## 2018-07-20 DIAGNOSIS — Z8709 Personal history of other diseases of the respiratory system: Secondary | ICD-10-CM

## 2018-07-20 DIAGNOSIS — M810 Age-related osteoporosis without current pathological fracture: Secondary | ICD-10-CM | POA: Diagnosis not present

## 2018-07-20 DIAGNOSIS — Z8639 Personal history of other endocrine, nutritional and metabolic disease: Secondary | ICD-10-CM

## 2018-07-20 DIAGNOSIS — M1711 Unilateral primary osteoarthritis, right knee: Secondary | ICD-10-CM

## 2018-07-20 DIAGNOSIS — Z8719 Personal history of other diseases of the digestive system: Secondary | ICD-10-CM

## 2018-07-20 DIAGNOSIS — M5136 Other intervertebral disc degeneration, lumbar region: Secondary | ICD-10-CM

## 2018-07-20 DIAGNOSIS — Z955 Presence of coronary angioplasty implant and graft: Secondary | ICD-10-CM

## 2018-07-20 DIAGNOSIS — Z5181 Encounter for therapeutic drug level monitoring: Secondary | ICD-10-CM

## 2018-07-20 DIAGNOSIS — M19042 Primary osteoarthritis, left hand: Secondary | ICD-10-CM

## 2018-07-20 MED ORDER — DICLOFENAC SODIUM 1 % TD GEL: TRANSDERMAL | 2 refills | Status: AC

## 2018-07-20 NOTE — Telephone Encounter (Signed)
Noted.Will place after lab.

## 2018-07-20 NOTE — Telephone Encounter (Signed)
Patient is due for reclast infusion in May 2020. She is also to obtain lab work 1 week prior to the infusion. Lab orders have been placed. Please place infusion order.

## 2018-07-21 ENCOUNTER — Telehealth: Payer: Self-pay | Admitting: Rheumatology

## 2018-07-21 ENCOUNTER — Ambulatory Visit: Payer: Medicare Other | Admitting: Physician Assistant

## 2018-07-21 NOTE — Telephone Encounter (Signed)
I LMOM for patient to call, and schedule her 5 month follow up appt with Sherron Ales, PAC.

## 2018-07-21 NOTE — Telephone Encounter (Signed)
-----   Message from [name], CMA sent at 07/20/2018  1:29 PM EDT ----- Patient had virtual visit today with Dr. Corliss Skains. Please call to schedule 4-5 month follow up. Thanks!

## 2018-10-10 ENCOUNTER — Ambulatory Visit (INDEPENDENT_AMBULATORY_CARE_PROVIDER_SITE_OTHER): Payer: Medicare Other | Admitting: Internal Medicine

## 2018-10-10 ENCOUNTER — Encounter: Payer: Self-pay | Admitting: Internal Medicine

## 2018-10-10 ENCOUNTER — Other Ambulatory Visit: Payer: Self-pay

## 2018-10-10 DIAGNOSIS — J449 Chronic obstructive pulmonary disease, unspecified: Secondary | ICD-10-CM | POA: Diagnosis not present

## 2018-10-10 DIAGNOSIS — R0609 Other forms of dyspnea: Secondary | ICD-10-CM

## 2018-10-10 MED ORDER — BEVESPI AEROSPHERE 9-4.8 MCG/ACT IN AERO: 2.0000 | INHALATION_SPRAY | Freq: Two times a day (BID) | RESPIRATORY_TRACT | 0 refills | Status: DC

## 2018-10-10 NOTE — Patient Instructions (Signed)
Goal is to keep your 02 at above 90% while walking.  Work on inhaler technique:  relax and gently blow all the way out then take a nice smooth deep breath back in, triggering the inhaler at same time you start breathing in.  Hold for up to 5 seconds if you can.   Rinse and gargle with water when done.  If not satisfied with bevespi, return with your drug formulary.   Please schedule a follow up visit in 6 months but call sooner if needed

## 2018-10-10 NOTE — Progress Notes (Signed)
Subjective:   Patient ID: [name], female    DOB: 12/29/1940     MRN: [id number]    Brief patient profile:  [id number] yowf   MM  quit smoking in 1986 with am throat congestion and felt fine until around 2012 with onset of doe so referred to pulmonary clinic 08/07/2015 by Dr Elmore GuiseEd Green with GOLD II criteria  06/11/2017     History of Present Illness  08/07/2015 1st [facility] office visit/ [name]   Chief Complaint  Patient presents with  . Pulmonary Consult    Referred by Dr. Nila NephewEdwin Green. Pt c/o SOB x 5 yrs- worse x 4 months. She is SOB with grocery shopping or lifting things. She also c/o occ PND and am cough- yellow to clear sputum.   indolent onset doe x 5 years  To point where has trouble carrying groceries from car to house and up x 3 steps and no real change on dulera or symbicort to date - thought advair workded better. Breathing Ok at hs and in am Does fine if rests or paces  Problems with doe also while vacuuming for more than 5 min- note Sometimes gags and heaves during coughing fits she attributes to pnds , esp in am but otherwise no excess/ purulent sputum or mucus plugs   RECS Add pepcid 20 mg at bedtime to the protonix before bfast Stop symbicort but if breathing worse restart except try no to take it am of test  GERD  Diet   Please schedule a follow up office visit in 6 weeks, call sooner if needed with pfts ok to push back a few weeks if needed > did not return as req     05/14/2017 acute extended ov/[name] re: sob/ cough full  pfts never done  Chief Complaint  Patient presents with  . Acute Visit    Increased SOB since end of Dec 2018. She gets SOB with exertion such as carrying her groceries in. She also c/o PND and occ cough with clear sputum.    prior to end Dec 2018 since last visit on no inhalers / on ppi before bfast s h2 hs > cough not better but able to do water aerobics twice weekly x one hour  Gagging and heaving / worse with brush teeth  since end of dec grad  worse Doe to point of = MMRC3 = can't walk 100 yards even at a slow pace at a flat grade s stopping due to sob  And reported "wheezing" with exertion (niether symptom reproduced on today's walk)  Sleeps flat ok rec Add pepcid 20 mg at bedtime GERD diet    06/11/2017  f/u ov/Lamount Bankson re: GOLD II COPD/   cough better  Chief Complaint  Patient presents with  . Follow-up    PFT's today, dyspnea on exertion   Dyspnea:    MMRC2 = can't walk a nl pace on a flat grade s sob but does fine slow and flat eg shopping at [employer] leaning on cart  Cough: ?  Little better  Sleep: ok  SABA use: none  rec Bevespi Take 2 puffs first thing in am and then another 2 puffs about 12 hours later.  Work on inhaler technique   07/01/2017  f/u ov/[name] re: GOLD II copd/ maint bevespi Chief Complaint  Patient presents with  . Follow-up    Breathing has improved some. She has no new co's. She does not have a recue inhaler.   Dyspnea:  MMRC2 =  can't walk a nl pace on a flat grade s sob but does fine slow and flat   Cough: no problem Sleep: ok flate  SABA use:  None rec Please see patient coordinator before you leave today  to schedule refer to rehab> graduated mid August 2019     NP 12/09/17  Acute ov   Will check BMP and CBC will call with results Stay well hydrated Continue bevespi Continue current medications Get plenty of rest cxr c/w ? pna > augmentin x 7 days    01/03/2018  f/u ov/[name] re: copd GOLD II Chief Complaint  Patient presents with  . Follow-up    Breathing has improved back to her normal baseline. She still has some cough with clear sputum.    Dyspnea:  MMRC2 = can't walk a nl pace on a flat grade s sob but does fine slow and flat   Cough: not much, am's only s truly excess/ purulent sputum or mucus plugs   Sleeping: flat bed, one pillow SABA use: none 02: none    rec Ok to use clariton or zyrtec over the counter  as needed for drainage  No change with bevespi  - if not happy bring back  you formulary and we will picky you an alternative    10/10/2018  f/u ov/[name] re: GOLD II/ maint on bevespi  Chief Complaint  Patient presents with  . Follow-up    6 month for COPD   Dyspnea:  No change = MMRC2 = can't walk a nl pace on a flat grade s sob but does fine slow and flat eg Cough: none Sleeping: ok flat one pillow SABA ZOX:WRUEuse:none 02: none -sats low 90s even walking    No obvious day to day or daytime variability or assoc excess/ purulent sputum or mucus plugs or hemoptysis or cp or chest tightness, subjective wheeze or overt sinus or hb symptoms.   Sleeping ok  without nocturnal  or early am exacerbation  of respiratory  c/o's or need for noct saba. Also denies any obvious fluctuation of symptoms with weather or environmental changes or other aggravating or alleviating factors except as outlined above   No unusual exposure hx or h/o childhood pna/ asthma or knowledge of premature birth.  Current Allergies, Complete Past Medical History, Past Surgical History, Family History, and Social History were reviewed in [facility] electronic medical record.  ROS  The following are not active complaints unless bolded Hoarseness, sore throat, dysphagia, dental problems, itching, sneezing,  nasal congestion or discharge of excess mucus or purulent secretions, ear ache,   fever, chills, sweats, unintended wt loss or wt gain, classically pleuritic or exertional cp,  orthopnea pnd or arm/hand swelling  or leg swelling, presyncope, palpitations, abdominal pain, anorexia, nausea, vomiting, diarrhea  or change in bowel habits or change in bladder habits, change in stools or change in urine, dysuria, hematuria,  rash, arthralgias, visual complaints, headache, numbness, weakness or ataxia or problems with walking or coordination,  change in mood or  memory.        Current Meds  Medication Sig  . aspirin EC 81 MG tablet Take 1 tablet (81 mg total) daily by mouth.  . calcium carbonate (OS-CAL)  600 MG TABS tablet Take 600 mg by mouth 2 (two) times daily with a meal.   . Cholecalciferol (VITAMIN D3) 1000 units CAPS Take 1,000 Units by mouth daily.  . diclofenac sodium (VOLTAREN) 1 % GEL Apply 2 grams to 4 grams to affected  area up to 4 times daily PRN.  Marland Kitchen Glycopyrrolate-Formoterol (BEVESPI AEROSPHERE) 9-4.8 MCG/ACT AERO Inhale 2 puffs into the lungs 2 (two) times daily.  Marland Kitchen loratadine (CLARITIN) 10 MG tablet Take 10 mg by mouth daily as needed.   Marland Kitchen losartan (COZAAR) 50 MG tablet Take 1 tablet (50 mg total) by mouth daily.  . metoprolol succinate (TOPROL-XL) 25 MG 24 hr tablet Take 1 tablet by mouth daily.  . nitroGLYCERIN (NITROSTAT) 0.4 MG SL tablet Place 1 tablet (0.4 mg total) under the tongue every 5 (five) minutes as needed. For chest pain  . pantoprazole (PROTONIX) 40 MG tablet Take 1 tablet by mouth daily.  . rosuvastatin (CRESTOR) 20 MG tablet Take 1 tablet (20 mg total) by mouth at bedtime.  . traMADol (ULTRAM) 50 MG tablet Take 50 mg by mouth every 6 (six) hours as needed.                    Objective:   Physical Exam  amb wf nad   10/10/2018        168  01/03/2018       165  07/01/2017       167  06/11/2017         165  05/14/2017         163   08/07/15 164 lb (74.39 kg)  06/11/15 160 lb (72.576 kg)  05/09/15 160 lb 3 oz (72.661 kg)      Vital signs reviewed - Note on arrival 02 sats  95% on RA         HEENT: full dentures/ nl oropharynx. Nl external ear canals without cough reflex -  Mild bilateral non-specific turbinate edema     NECK :  without JVD/Nodes/TM/ nl carotid upstrokes bilaterally   LUNGS: no acc muscle use,  Mod kyphosis, mild barrel  contour chest wall with bilateral  Distant bs s audible wheeze and  without cough on insp or exp maneuver and mild  Hyperresonant  to  percussion bilaterally     CV:  RRR  no s3 or murmur or increase in P2, and no edema   ABD:  soft and nontender with pos late insp Hoover's  in the supine position. No bruits or  organomegaly appreciated, bowel sounds nl  MS:   Nl gait/  ext warm without deformities, calf tenderness, cyanosis or clubbing No obvious joint restrictions   SKIN: warm and dry without lesions    NEURO:  alert, approp, nl sensorium with  no motor or cerebellar deficits apparent.              I personally reviewed images and agree with radiology impression as follows:  CXR:   07/11/2018 Slightly displaced fracture of the anterolateral aspect of the right eleventh rib.      Assessment & Plan:

## 2018-10-11 ENCOUNTER — Encounter: Payer: Self-pay | Admitting: Internal Medicine

## 2018-10-11 NOTE — Assessment & Plan Note (Signed)
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 >  MM/ level 146  - 05/14/2017  Walked RA x 3 laps @ 185 ft each stopped due to  End of study, nl pace, no   desat - mild sob p 2nd lap   - Allergy profile 05/14/17  >  Eos 1.1% /  IgE  92 RAST neg - PFT's  06/11/2017  FEV1 1.17 (54 % ) ratio 57  p 11 % improvement from saba p nothing prior to study with DLCO  40/40c % corrects to 57  % for alv volume  - 06/11/2017    try bevespi  > some better - 07/01/2017 refer to rehab  - 10/10/2018  After extensive coaching inhaler device,  effectiveness =    75% (short ti)    Pt is Group B in terms of symptom/risk and laba/lama therefore appropriate rx at this point >>>  Continue bevespi for now  Advised:  formulary restrictions will be an ongoing challenge for the forseable future and I would be happy to pick an alternative if the pt will first  provide me a list of them -  pt  will need to return here for training for any new device that is required eg dpi vs hfa vs respimat.    In the meantime we can always provide samples so that the patient never runs out of any needed respiratory medications.

## 2018-10-11 NOTE — Assessment & Plan Note (Signed)
-   05/14/2017  Walked RA x 3 laps @ 185 ft each stopped due to  End of study, nl pace, no   desat - mild sob p 2nd lap   - Allergy profile 05/14/17  >  Eos 1.1% /  IgE  92 RAST neg  rec continued paced ex and keep track of 02 sats during exertion with goal of keeping > 90% or walking at a slower pace   I had an extended discussion with the patient reviewing all relevant studies completed to date and  lasting 15 to 20 minutes of a 25 minute visit    See device teaching which extended face to face time for this visit.  Each maintenance medication was reviewed in detail including emphasizing most importantly the difference between maintenance and prns and under what circumstances the prns are to be triggered using an action plan format that is not reflected in the computer generated alphabetically organized AVS which I have not found useful in most complex patients, especially with respiratory illnesses  Please see AVS for specific instructions unique to this visit that I personally wrote and verbalized to the the pt in detail and then reviewed with pt  by my nurse highlighting any  changes in therapy recommended at today's visit to their plan of care.

## 2018-10-31 NOTE — Telephone Encounter (Signed)
Called to remind patient that she is due for Reclast infusion and will need labs prior to orders.  No answer. Left voicemail.

## 2018-11-02 ENCOUNTER — Other Ambulatory Visit: Payer: Self-pay

## 2018-11-02 ENCOUNTER — Ambulatory Visit (INDEPENDENT_AMBULATORY_CARE_PROVIDER_SITE_OTHER): Payer: Medicare Other | Admitting: Podiatry

## 2018-11-02 ENCOUNTER — Encounter: Payer: Self-pay | Admitting: Podiatry

## 2018-11-02 VITALS — Temp 97.0°F

## 2018-11-02 DIAGNOSIS — M79674 Pain in right toe(s): Secondary | ICD-10-CM | POA: Diagnosis not present

## 2018-11-02 DIAGNOSIS — B351 Tinea unguium: Secondary | ICD-10-CM

## 2018-11-02 DIAGNOSIS — M79675 Pain in left toe(s): Secondary | ICD-10-CM | POA: Diagnosis not present

## 2018-11-02 DIAGNOSIS — L600 Ingrowing nail: Secondary | ICD-10-CM

## 2018-11-02 NOTE — Progress Notes (Signed)
Complaint:  Visit Type: Patient returns to my office for continued preventative foot care services. Complaint: Patient states" my nails have grown long and thick and become painful to walk and wear shoes" The patient presents for preventative foot care services. No changes to ROS  Podiatric Exam: Vascular: dorsalis pedis  are palpable bilateral.  Posterior tibial pulses are absent  B/L. Capillary return is immediate. Temperature gradient is WNL. Skin turgor WNL  Sensorium: Normal Semmes Weinstein monofilament test. Normal tactile sensation bilaterally. Nail Exam: Pt has thick disfigured discolored nails with subungual debris noted bilateral entire nail hallux through fifth toenails.  Pincer nails  B/l Ulcer Exam: There is no evidence of ulcer or pre-ulcerative changes or infection. Orthopedic Exam: Muscle tone and strength are WNL. No limitations in general ROM. No crepitus or effusions noted. Foot type and digits show no abnormalities. Bony prominences are unremarkable. Skin: No Porokeratosis. No infection or ulcers  Diagnosis:  Onychomycosis, , Pain in right toe, pain in left toes  Treatment & Plan Procedures and Treatment: Consent by patient was obtained for treatment procedures.   Debridement of mycotic and hypertrophic toenails, 1 through 5 bilateral and clearing of subungual debris. No ulceration, no infection noted.  Return Visit-Office Procedure: Patient instructed to return to the office for a follow up visit 3 months for continued evaluation and treatment.    Shaquoya Cosper DPM 

## 2018-11-04 NOTE — Telephone Encounter (Signed)
Attempted to contact the patient and left message for patient to contact the office.  

## 2018-11-11 NOTE — Telephone Encounter (Signed)
Patient advised she is due for Reclast infusion. Patient states she would to post pone another week as she has family coming in. Patient states she will come to the office to have lab work done the week after next.

## 2018-11-22 NOTE — Progress Notes (Signed)
Office Visit    Patient Name: [name] Date of Encounter: 11/23/2018  Primary Care Provider:  Burton Apleyoberts, Ronald, MD Primary Cardiologist:  P. SwazilandJordan, MD   Chief Complaint    78 year old female with a prior history of coronary artery disease status post anterior MI and LAD bare-metal stenting complicated by VF arrest in 2011, hypertension, hyperlipidemia, GERD, TIA,who presents for follow-up.  Past Medical History    Past Medical History:  Diagnosis Date  . Compression fracture    T9  . COPD (chronic obstructive pulmonary disease) (HCC)   . Coronary artery disease 08/14/2009   a. 08/2009: Ant MI c/b v-fib arrest, s/p PTCA/BMS to LAD. (STENT Placement); b. 2013 Myoview: no ischemia; c. 11/2012 Cath: patent mLAD stent, RI 70 (stable), otw nonobs dzs, EF 65%;  d. 07/2016 Lexiscan MV: EF 56%, no ischemia/infarct.  Marland Kitchen. GERD (gastroesophageal reflux disease)   . HTN (hypertension)   . Hyperlipidemia   . Ischemic cardiomyopathy    a. EF 40-45% in 08/2009 at time of MI. b. Improved to 55-60% by June 2011; c. 03/2016 Echo: EF 55-60%, no rwma, Gr1 DD, mild LVH.  . Osteoarthritis   . Osteopenia   . PONV (postoperative nausea and vomiting)   . Postoperative groin pseudoaneurysm    a. 04/2010 following diagnostic cardiac cath, s/p compression.  . Rib fractures    left sided second and sixth rib   . Shingles   . TIA (transient ischemic attack)    a. 03/2016 MRI/MRA negative/unremarkable.  . Ventricular fibrillation (HCC)    a. 08/2009: due to anterior MI.   Past Surgical History:  Procedure Laterality Date  . APPENDECTOMY    . BACK SURGERY    . LEFT HEART CATHETERIZATION WITH CORONARY ANGIOGRAM N/A 11/16/2012   Procedure: LEFT HEART CATHETERIZATION WITH CORONARY ANGIOGRAM;  Surgeon: Kathleene Hazelhristopher D McAlhany, MD;  [facility];  Service: Cardiovascular;  Laterality: N/A;  . LEG SURGERY     R low  . ORIF HIP FRACTURE    . TONSILLECTOMY      Allergies  Allergies  Allergen  Reactions  . Codeine Nausea And Vomiting and Other (See Comments)  . Imdur [Isosorbide Dinitrate] Nausea Only    Headache Headache  . Other Nausea And Vomiting  . Sulfa Antibiotics Other (See Comments)  . Sulfonamide Derivatives Nausea And Vomiting    History of Present Illness    78 year old female with the above complex past medical history. She is status post anterior MI and VF arrest in May 2011 with bare-metal stenting of the LAD. EF was 40-45% at that time but subsequently recovered.  Her last catheterization took place in August 2014 which revealed stable 70% stenosis within the ramus intermedius and a patent LAD stent. EF was 65% at that time. Other history includes hypertension, HL, ICM with subsequent Normalization of LV function, TIA in December 2017.  In April 2018 she had a  [facility] Myoview which showed no evidence of ischemia or infarct with normal LV function.   She does have dyspnea and is followed  by Dr. Sherene SiresWert with GOLD stage 2 COPD. On inhaler therapy now.   On follow up today she is doing OK. States breathing is more difficult with the hot weather. No cough. No chest pain. No edema. Does a lot of waiting on her husband who gets around with a walker.   Home Medications    Allergies as of 11/23/2018      Reactions   Codeine Nausea  And Vomiting, Other (See Comments)   Imdur [isosorbide Dinitrate] Nausea Only   Headache Headache   Other Nausea And Vomiting   Sulfa Antibiotics Other (See Comments)   Sulfonamide Derivatives Nausea And Vomiting      Medication List       Accurate as of November 23, 2018  2:40 PM. If you have any questions, ask your nurse or doctor.        aspirin EC 81 MG tablet Take 1 tablet (81 mg total) daily by mouth.   Bevespi Aerosphere 9-4.8 MCG/ACT Aero Generic drug: Glycopyrrolate-Formoterol Inhale 2 puffs into the lungs 2 (two) times daily.   calcium carbonate 600 MG Tabs tablet Commonly known as: OS-CAL Take 600 mg by mouth 2  (two) times daily with a meal.   diclofenac sodium 1 % Gel Commonly known as: VOLTAREN Apply 2 grams to 4 grams to affected area up to 4 times daily PRN.   loratadine 10 MG tablet Commonly known as: CLARITIN Take 10 mg by mouth daily as needed.   losartan 50 MG tablet Commonly known as: COZAAR Take 1 tablet (50 mg total) by mouth daily.   metoprolol succinate 25 MG 24 hr tablet Commonly known as: TOPROL-XL Take 1 tablet by mouth daily.   nitroGLYCERIN 0.4 MG SL tablet Commonly known as: NITROSTAT Place 1 tablet (0.4 mg total) under the tongue every 5 (five) minutes as needed. For chest pain   pantoprazole 40 MG tablet Commonly known as: PROTONIX Take 1 tablet by mouth daily.   RECLAST IV Inject into the vein. Once Yearly   rosuvastatin 20 MG tablet Commonly known as: CRESTOR Take 1 tablet (20 mg total) by mouth at bedtime.   traMADol 50 MG tablet Commonly known as: ULTRAM Take 50 mg by mouth every 6 (six) hours as needed.   Vitamin D3 25 MCG (1000 UT) Caps Take 1,000 Units by mouth daily.        Review of Systems    As noted in HPI. All other systems reviewed and are otherwise negative except as noted above.  Physical Exam    VS:  BP 134/86   Pulse 71   Temp (!) 96.9 F (36.1 C)   Ht 5' 5.5" (1.664 m)   Wt 168 lb 3.2 oz (76.3 kg)   SpO2 95%   BMI 27.56 kg/m  , BMI Body mass index is 27.56 kg/m. GENERAL:  Well appearing overweight WF in NAD HEENT:  PERRL, EOMI, sclera are clear. Oropharynx is clear. NECK:  No jugular venous distention, carotid upstroke brisk and symmetric, no bruits, no thyromegaly or adenopathy LUNGS:  Scattered bilateral rhonchi CHEST:  Unremarkable HEART:  RRR,  PMI not displaced or sustained,S1 and S2 within normal limits, no S3, no S4: no clicks, no rubs, no murmurs ABD:  Soft, nontender. BS +, no masses or bruits. No hepatomegaly, no splenomegaly EXT:  2 + pulses throughout, no edema, no cyanosis no clubbing. Varicose veins.  SKIN:  Warm and dry.  No rashes NEURO:  Alert and oriented x 3. Cranial nerves II through XII intact. PSYCH:  Cognitively intact        Accessory Clinical Findings     Laboratory data:  Lab Results  Component Value Date   WBC 7.6 01/10/2018   HGB 12.2 01/10/2018   HCT 36.0 01/10/2018   PLT 238 01/10/2018   GLUCOSE 76 01/10/2018   CHOL 158 03/17/2016   TRIG 72 03/17/2016   HDL 82 03/17/2016   LDLDIRECT 134.9  11/21/2007   LDLCALC 62 03/17/2016   ALT 17 01/10/2018   AST 26 01/10/2018   NA 135 01/10/2018   K 4.3 01/10/2018   CL 99 01/10/2018   CREATININE 0.70 01/10/2018   BUN 10 01/10/2018   CO2 24 01/10/2018   TSH 1.72 05/14/2017   INR 1.02 01/10/2018   HGBA1C 5.8 (H) 03/17/2016    Lexiscan Myoview-07/10/2016  The left ventricular ejection fraction is normal (55-65%). Nuclear stress EF: 56%. There was no ST segment deviation noted during stress. The study is normal. This is a low risk study.  Ecg today shows NSR with PACs rate 65. Low voltage. I have personally reviewed and interpreted this study.    Assessment & Plan    1.  Coronary artery disease: s/p anterior MI in 2011 with stenting of the LAD. Normal Myoview study in April 2018. Last Echo in 2017 showed normal LV function. She is asymptomatic. She has some dyspnea more related to COPD.  Continue medical therapy.   2. Essential hypertension: Blood pressure is well controlled.   3. Hyperlipidemia: She is on Crestor. LDL was 62 in December 2017. Labs followed by Dr Su Hiltoberts. Goal LDL <70.  4. COPD. Followed by Dr. Sherene SiresWert.   Follow up in 6 months.  Peter SwazilandJordan, MD,FACC 11/23/2018, 2:40 PM

## 2018-11-23 ENCOUNTER — Encounter: Payer: Self-pay | Admitting: Cardiology

## 2018-11-23 ENCOUNTER — Ambulatory Visit (INDEPENDENT_AMBULATORY_CARE_PROVIDER_SITE_OTHER): Payer: Medicare Other | Admitting: Cardiology

## 2018-11-23 ENCOUNTER — Other Ambulatory Visit: Payer: Self-pay

## 2018-11-23 VITALS — BP 134/86 | HR 71 | Temp 96.9°F | Ht 65.5 in | Wt 168.2 lb

## 2018-11-23 DIAGNOSIS — E7800 Pure hypercholesterolemia, unspecified: Secondary | ICD-10-CM

## 2018-11-23 DIAGNOSIS — I10 Essential (primary) hypertension: Secondary | ICD-10-CM

## 2018-11-23 DIAGNOSIS — I2510 Atherosclerotic heart disease of native coronary artery without angina pectoris: Secondary | ICD-10-CM | POA: Diagnosis not present

## 2018-11-28 ENCOUNTER — Other Ambulatory Visit: Payer: Self-pay

## 2018-11-28 DIAGNOSIS — M810 Age-related osteoporosis without current pathological fracture: Secondary | ICD-10-CM

## 2018-11-28 DIAGNOSIS — Z5181 Encounter for therapeutic drug level monitoring: Secondary | ICD-10-CM

## 2018-11-29 LAB — CBC WITH DIFFERENTIAL/PLATELET
Absolute Monocytes: 582 cells/uL (ref 200–950)
Basophils Absolute: 28 cells/uL (ref 0–200)
Basophils Relative: 0.4 %
Eosinophils Absolute: 213 cells/uL (ref 15–500)
Eosinophils Relative: 3 %
HCT: 37.8 % (ref 35.0–45.0)
Hemoglobin: 12.7 g/dL (ref 11.7–15.5)
Lymphs Abs: 1441 cells/uL (ref 850–3900)
MCH: 34.1 pg — ABNORMAL HIGH (ref 27.0–33.0)
MCHC: 33.6 g/dL (ref 32.0–36.0)
MCV: 101.6 fL — ABNORMAL HIGH (ref 80.0–100.0)
MPV: 10.2 fL (ref 7.5–12.5)
Monocytes Relative: 8.2 %
Neutro Abs: 4835 cells/uL (ref 1500–7800)
Neutrophils Relative %: 68.1 %
Platelets: 264 10*3/uL (ref 140–400)
RBC: 3.72 10*6/uL — ABNORMAL LOW (ref 3.80–5.10)
RDW: 11.5 % (ref 11.0–15.0)
Total Lymphocyte: 20.3 %
WBC: 7.1 10*3/uL (ref 3.8–10.8)

## 2018-11-29 LAB — COMPLETE METABOLIC PANEL WITH GFR
AG Ratio: 1.3 (calc) (ref 1.0–2.5)
ALT: 12 U/L (ref 6–29)
AST: 19 U/L (ref 10–35)
Albumin: 4 g/dL (ref 3.6–5.1)
Alkaline phosphatase (APISO): 67 U/L (ref 37–153)
BUN: 15 mg/dL (ref 7–25)
CO2: 29 mmol/L (ref 20–32)
Calcium: 9.2 mg/dL (ref 8.6–10.4)
Chloride: 98 mmol/L (ref 98–110)
Creat: 0.77 mg/dL (ref 0.60–0.93)
GFR, Est African American: 85 mL/min/{1.73_m2} (ref >=60)
GFR, Est Non African American: 73 mL/min/{1.73_m2} (ref >=60)
Globulin: 3 g/dL (calc) (ref 1.9–3.7)
Glucose, Bld: 80 mg/dL (ref 65–99)
Potassium: 5.3 mmol/L (ref 3.5–5.3)
Sodium: 134 mmol/L — ABNORMAL LOW (ref 135–146)
Total Bilirubin: 0.5 mg/dL (ref 0.2–1.2)
Total Protein: 7 g/dL (ref 6.1–8.1)

## 2018-11-29 NOTE — Progress Notes (Signed)
Sodium is borderline low.  We will continue to monitor.  MCV and MCH are elevated and RBC count is borderline low. Hgb and Hct are WNL.  Please forward to PCP.

## 2018-12-02 ENCOUNTER — Telehealth: Payer: Self-pay | Admitting: Rheumatology

## 2018-12-02 ENCOUNTER — Other Ambulatory Visit: Payer: Self-pay | Admitting: *Deleted

## 2018-12-02 DIAGNOSIS — M810 Age-related osteoporosis without current pathological fracture: Secondary | ICD-10-CM

## 2018-12-02 NOTE — Telephone Encounter (Signed)
Patient advised Recast infusion orders have been placed.

## 2018-12-02 NOTE — Telephone Encounter (Signed)
Patient was calling because she thought you were to send over lab results to [facility] for her next infusion. Patient called [facility], and was told they did not have those results. Patient was advised we would make sure order for next infusion is put in so patient can schedule appt. Also, [facility] should be able to see lab results if they are in patient's chart, that they do not need to be "sent" over. Please call patient to clear up any confusion.

## 2018-12-19 ENCOUNTER — Other Ambulatory Visit: Payer: Self-pay

## 2018-12-19 ENCOUNTER — Ambulatory Visit (HOSPITAL_COMMUNITY)
Admission: RE | Admit: 2018-12-19 | Discharge: 2018-12-19 | Disposition: A | Discharge status: Home / Self Care | Payer: Medicare Other | Source: Ambulatory Visit | Attending: Rheumatology | Admitting: Rheumatology

## 2018-12-19 DIAGNOSIS — M810 Age-related osteoporosis without current pathological fracture: Secondary | ICD-10-CM

## 2018-12-19 MED ORDER — ZOLEDRONIC ACID 5 MG/100ML IV SOLN
5.0000 mg | Freq: Once | INTRAVENOUS | Status: AC
  Administered 2018-12-19: 13:00:00 5 mg via INTRAVENOUS

## 2018-12-19 MED ORDER — DIPHENHYDRAMINE HCL 25 MG PO CAPS: 25.0000 mg | ORAL_CAPSULE | ORAL | Status: DC

## 2018-12-19 MED ORDER — ACETAMINOPHEN 325 MG PO TABS
650.0000 mg | ORAL_TABLET | ORAL | Status: AC
  Administered 2018-12-19: 13:00:00 650 mg via ORAL

## 2018-12-19 MED ORDER — ACETAMINOPHEN 325 MG PO TABS
ORAL_TABLET | ORAL | Status: AC
  Administered 2018-12-19: 650 mg via ORAL
  Filled 2018-12-19: qty 2

## 2018-12-19 MED ORDER — ZOLEDRONIC ACID 5 MG/100ML IV SOLN
INTRAVENOUS | Status: AC
  Administered 2018-12-19: 5 mg via INTRAVENOUS
  Filled 2018-12-19: qty 100

## 2019-01-23 ENCOUNTER — Other Ambulatory Visit: Payer: Self-pay

## 2019-01-23 DIAGNOSIS — Z20822 Contact with and (suspected) exposure to covid-19: Secondary | ICD-10-CM

## 2019-01-25 LAB — NOVEL CORONAVIRUS, NAA: SARS-CoV-2, NAA: NOT DETECTED

## 2019-02-08 ENCOUNTER — Ambulatory Visit: Payer: Medicare Other | Admitting: Podiatry

## 2019-02-20 ENCOUNTER — Other Ambulatory Visit: Payer: Self-pay

## 2019-02-20 ENCOUNTER — Ambulatory Visit (INDEPENDENT_AMBULATORY_CARE_PROVIDER_SITE_OTHER): Payer: Medicare Other | Admitting: Pulmonary Disease

## 2019-02-20 ENCOUNTER — Encounter: Payer: Self-pay | Admitting: Pulmonary Disease

## 2019-02-20 DIAGNOSIS — J449 Chronic obstructive pulmonary disease, unspecified: Secondary | ICD-10-CM | POA: Diagnosis not present

## 2019-02-20 DIAGNOSIS — J309 Allergic rhinitis, unspecified: Secondary | ICD-10-CM | POA: Insufficient documentation

## 2019-02-20 MED ORDER — PREDNISONE 10 MG PO TABS: ORAL_TABLET | ORAL | 0 refills | Status: DC

## 2019-02-20 MED ORDER — DOXYCYCLINE HYCLATE 100 MG PO TABS: 100.0000 mg | ORAL_TABLET | Freq: Two times a day (BID) | ORAL | 0 refills | Status: DC

## 2019-02-20 NOTE — Patient Instructions (Addendum)
You were seen today by Lauraine Rinne, NP  for:   1. COPD GOLD II  - predniSONE (DELTASONE) 10 MG tablet; 4 tabs for 2 days, then 3 tabs for 2 days, 2 tabs for 2 days, then 1 tab for 2 days, then stop  Dispense: 20 tablet; Refill: 0 - doxycycline (VIBRA-TABS) 100 MG tablet; Take 1 tablet (100 mg total) by mouth 2 (two) times daily.  Dispense: 14 tablet; Refill: 0  Bevespi Aerosphere inhaler >>>2 puffs daily twice a day (4 puffs total daily) >>>This is not a rescue inhaler >>>You take this daily no matter what  Note your daily symptoms > remember "red flags" for COPD:   >>>Increase in cough >>>increase in sputum production >>>increase in shortness of breath or activity  intolerance.   If you notice these symptoms, please call the office to be seen.    2. Allergic rhinitis, unspecified seasonality, unspecified trigger  Continue Claritin daily  Please start taking Flonase 1 spray each nostril daily as needed for nasal congestion or allergies, okay to increase to Flonase 1 spray each nostril every 12 hours for worsening nasal congestion or allergies  Please also start nasal saline rinses 1-2 times daily prior to Flonase use   We recommend today:  No orders of the defined types were placed in this encounter.  No orders of the defined types were placed in this encounter.  Meds ordered this encounter  Medications  . predniSONE (DELTASONE) 10 MG tablet    Sig: 4 tabs for 2 days, then 3 tabs for 2 days, 2 tabs for 2 days, then 1 tab for 2 days, then stop    Dispense:  20 tablet    Refill:  0  . doxycycline (VIBRA-TABS) 100 MG tablet    Sig: Take 1 tablet (100 mg total) by mouth 2 (two) times daily.    Dispense:  14 tablet    Refill:  0    Follow Up:    Return in about 4 weeks (around 03/20/2019), or if symptoms worsen or fail to improve, for Follow up with Dr. Melvyn Novas.   Please do your part to reduce the spread of COVID-19:      Reduce your risk of any infection  and  COVID19 by using the similar precautions used for avoiding the common cold or flu:  Marland Kitchen Wash your hands often with soap and warm water for at least 20 seconds.  If soap and water are not readily available, use an alcohol-based hand sanitizer with at least 60% alcohol.  . If coughing or sneezing, cover your mouth and nose by coughing or sneezing into the elbow areas of your shirt or coat, into a tissue or into your sleeve (not your hands). Langley Gauss A MASK when in public  . Avoid shaking hands with others and consider head nods or verbal greetings only. . Avoid touching your eyes, nose, or mouth with unwashed hands.  . Avoid close contact with people who are sick. . Avoid places or events with large numbers of people in one location, like concerts or sporting events. . If you have some symptoms but not all symptoms, continue to monitor at home and seek medical attention if your symptoms worsen. . If you are having a medical emergency, call 911.   [place] / e-Visit: [email or web address]         [facility]: [facility]: [phone]  [facility]: [phone]     It is flu season:   >>> Best ways to protect herself from the flu: Receive the yearly flu vaccine, practice good hand hygiene washing with soap and also using hand sanitizer when available, eat a nutritious meals, get adequate rest, hydrate appropriately   Please contact the office if your symptoms worsen or you have concerns that you are not improving.   Thank you for choosing [facility] for your healthcare, and for allowing [place] to partner with you on your healthcare journey. I am thankful to be able to provide care to you today.   Wyn Quaker FNP-C

## 2019-02-20 NOTE — Assessment & Plan Note (Addendum)
Plan: Doxycycline today Prednisone today Continue Bevespi Close follow-up in our office in 2 to 4-week follow-up with Dr. With all medications and hand

## 2019-02-20 NOTE — Progress Notes (Signed)
Virtual Visit via Telephone Note  I connected with [name] on 02/20/19 at 11:30 AM EST by telephone and verified that I am speaking with the correct person using two identifiers.  Location: Patient: Home Provider: Office [facility] - [address] [place], [address], [place], [place] [address]   I discussed the limitations, risks, security and privacy concerns of performing an evaluation and management service by telephone and the availability of in person appointments. I also discussed with the patient that there may be a patient responsible charge related to this service. The patient expressed understanding and agreed to proceed.  Patient consented to consult via telephone: Yes People present and their role in pt care: Pt    History of Present Illness:  78 year old female former smoker followed in our office for COPD  Past medical history: Hyperlipidemia, macrocytic anemia, CAD, GERD, osteoarthritis, osteopenia, hypertension Smoking history: Former smoker.  Quit 1986.  40-pack-year smoking history. Maintenance: Bevespi Patient of Dr. Sherene Sires  Chief complaint: Worsening shortness of breath and wheezing   78 year old female former smoker followed in our office for COPD.  Patient contacted our office on 02/20/2019 to report that she has had worsened shortness of breath.  Patient reports that she has noticed that her breathing has worsened with the change of the weather.  She reports this is a typical flare for her.  She has noticed that she has increased dyspnea especially by the end of the day.  She has increased nasal drainage as well.  She has noticed that her work of breathing has increased.  Patient remains adherent to her Bevespi.  She does not have a rescue inhaler that she uses.  She feels that her cough and mucus production is at baseline.  She was treated for bronchitis-like episode by her primary care doctor about a month ago.  She was started on amoxicillin.  She took this for  a few days then she stopped on her own accord due to feeling that she was having side effects to the medication.  She has not followed up with primary care regarding this.  She also was tested for COVID-19 at that time and was found to be negative.  Patient reports that she remains afebrile.  She does feel that she is having more clear nasal congestion.  She reports that she continues to take her Claritin.  She does have Flonase at home when she was previously trialed on this.  Allergy profile and February/2019 showed IgE of 92 as well as peripheral eosinophilia.  Observations/Objective:  01/23/2019-SARS-CoV-2-not detected  11/28/2018-CBC with differential-eosinophils relative 3, eosinophils absolute 213  06/11/2017-pulmonary function test-FVC 1.72 (59% predicted), postbronchodilator ratio 57, postbronchodilator FEV1 1.17 (54% predicted), positive bronchodilator response in FVC, mid flow reversibility, DLCO 10.74 (40% predicted)  Assessment and Plan:  Allergic rhinitis Plan: Continue Claritin Start Flonase 1 spray each nostril for management of nasal congestion allergy-like symptoms Start nasal saline rinses 1-2 times daily  COPD GOLD II Plan: Doxycycline today Prednisone today Continue Bevespi Close follow-up in our office in 2 to 4-week follow-up with Dr. With all medications and hand    Follow Up Instructions:  Return in about 4 weeks (around 03/20/2019), or if symptoms worsen or fail to improve, for Follow up with Dr. Sherene Sires.   I discussed the assessment and treatment plan with the patient. The patient was provided an opportunity to ask questions and all were answered. The patient agreed with the plan and demonstrated an understanding of the instructions.  The patient was advised to call back or seek an in-person evaluation if the symptoms worsen or if the condition fails to improve as anticipated.  I provided 23 minutes of non-face-to-face time during this encounter.   Lauraine Rinne, NP

## 2019-02-20 NOTE — Assessment & Plan Note (Signed)
Plan: Continue Claritin Start Flonase 1 spray each nostril for management of nasal congestion allergy-like symptoms Start nasal saline rinses 1-2 times daily

## 2019-03-22 ENCOUNTER — Other Ambulatory Visit: Payer: Self-pay

## 2019-03-22 ENCOUNTER — Encounter: Payer: Self-pay | Admitting: Internal Medicine

## 2019-03-22 ENCOUNTER — Ambulatory Visit (INDEPENDENT_AMBULATORY_CARE_PROVIDER_SITE_OTHER): Payer: Medicare Other | Admitting: Internal Medicine

## 2019-03-22 DIAGNOSIS — J449 Chronic obstructive pulmonary disease, unspecified: Secondary | ICD-10-CM | POA: Diagnosis not present

## 2019-03-22 MED ORDER — ALBUTEROL SULFATE HFA 108 (90 BASE) MCG/ACT IN AERS: INHALATION_SPRAY | RESPIRATORY_TRACT | 1 refills | Status: AC

## 2019-03-22 MED ORDER — PREDNISONE 10 MG PO TABS: ORAL_TABLET | ORAL | 0 refills | Status: DC

## 2019-03-22 MED ORDER — BREZTRI AEROSPHERE 160-9-4.8 MCG/ACT IN AERO: 2.0000 | INHALATION_SPRAY | Freq: Two times a day (BID) | RESPIRATORY_TRACT | 11 refills | Status: AC

## 2019-03-22 NOTE — Patient Instructions (Addendum)
If flare :  Prednisone is Take 4 for two days three for two days two for two days one for two days   Instead of refilling bevespi,  Change Breztri Take 2 puffs first thing in am and then another 2 puffs about 12 hours later.   Only use your albuterol as a rescue medication to be used if you can't catch your breath by resting or doing a relaxed purse lip breathing pattern.  - The less you use it, the better it will work when you need it. - Ok to use up to 2 puffs  every 4 hours if you must but call for immediate appointment if use goes up over your usual need - Don't leave home without it !!  (think of it like the spare tire for your car)   Please schedule a follow up visit in 6 months but call sooner if needed

## 2019-03-22 NOTE — Assessment & Plan Note (Addendum)
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 >  MM/ level 146  - 05/14/2017  Walked RA x 3 laps @ 185 ft each stopped due to  End of study, nl pace, no   desat - mild sob p 2nd lap   - Allergy profile 05/14/17  >  Eos 1.1% /  IgE  92 RAST neg - PFT's  06/11/2017  FEV1 1.17 (54 % ) ratio 57  p 11 % improvement from saba p nothing prior to study with DLCO  40/40c % corrects to 57  % for alv volume  - 06/11/2017    try bevespi  > some better - 07/01/2017 refer to rehab  - 10/10/2018  After extensive coaching inhaler device,  effectiveness =    75% (short ti)  - 03/22/2019  Only better while on pred/bevespi so try breztri if covered   Apparently  Group D in terms of symptom/risk and laba/lama/ICS  therefore appropriate rx at this point >>>  breztri or trelegy best single inhaler options   Discussed in detail all the  indications, usual  risks and alternatives  relative to the benefits with patient who agrees to proceed with Rx as outlined.      Advised:  formulary restrictions will be an ongoing challenge for the forseable future and I would be happy to pick an alternative if the pt will first  provide me a list of them -  pt  will need to return here for training for any new device that is required eg dpi vs hfa vs respimat.    In the meantime we can always provide samples so that the patient never runs out of any needed respiratory medications.   Pt informed of the seriousness of COVID 19 infection as a direct risk to their health  and safey and to those of their loved ones and should continue to wear facemask in public and minimize exposure to public locations but especially avoid any area or activity where non-close contacts are not observing distancing or wearing an appropriate face mask and vaccine as soon as offered.    Each maintenance medication was reviewed in  detail including most importantly the difference between maintenance and as needed and under what circumstances the prns are to be used.  Please see AVS for specific  Instructions which are unique to this visit and I personally typed out  which were reviewed in detail over the phone with the patient and a copy provided via MyChart

## 2019-03-22 NOTE — Progress Notes (Signed)
Subjective:   Patient ID: [name], female    DOB: 12/29/1940     MRN: [id number]    Brief patient profile:  [id number] [id number]   MM  quit smoking in 1986 with am throat congestion and felt fine until around 2012 with onset of doe so referred to pulmonary clinic 08/07/2015 by Dr Elmore GuiseEd Green with GOLD II criteria  06/11/2017     History of Present Illness  08/07/2015 1st [facility] office visit/ Sandrika Schwinn   Chief Complaint  Patient presents with  . Pulmonary Consult    Referred by Dr. Nila NephewEdwin Green. Pt c/o SOB x 5 yrs- worse x 4 months. She is SOB with grocery shopping or lifting things. She also c/o occ PND and am cough- yellow to clear sputum.   indolent onset doe x 5 years  To point where has trouble carrying groceries from car to house and up x 3 steps and no real change on dulera or symbicort to date - thought advair workded better. Breathing Ok at hs and in am Does fine if rests or paces  Problems with doe also while vacuuming for more than 5 min- note Sometimes gags and heaves during coughing fits she attributes to pnds , esp in am but otherwise no excess/ purulent sputum or mucus plugs   RECS Add pepcid 20 mg at bedtime to the protonix before bfast Stop symbicort but if breathing worse restart except try no to take it am of test  GERD  Diet   Please schedule a follow up office visit in 6 weeks, call sooner if needed with pfts ok to push back a few weeks if needed > did not return as req     05/14/2017 acute extended ov/Brielyn Bosak re: sob/ cough full  pfts never done  Chief Complaint  Patient presents with  . Acute Visit    Increased SOB since end of Dec 2018. She gets SOB with exertion such as carrying her groceries in. She also c/o PND and occ cough with clear sputum.    prior to end Dec 2018 since last visit on no inhalers / on ppi before bfast s h2 hs > cough not better but able to do water aerobics twice weekly x one hour  Gagging and heaving / worse with brush teeth  since end of dec grad  worse Doe to point of = MMRC3 = can't walk 100 yards even at a slow pace at a flat grade s stopping due to sob  And reported "wheezing" with exertion (niether symptom reproduced on today's walk)  Sleeps flat ok rec Add pepcid 20 mg at bedtime GERD diet    06/11/2017  f/u ov/Lydia Meng re: GOLD II COPD/   cough better  Chief Complaint  Patient presents with  . Follow-up    PFT's today, dyspnea on exertion   Dyspnea:    MMRC2 = can't walk a nl pace on a flat grade s sob but does fine slow and flat eg shopping at [employer] leaning on cart  Cough: ?  Little better  Sleep: ok  SABA use: none  rec Bevespi Take 2 puffs first thing in am and then another 2 puffs about 12 hours later.  Work on inhaler technique   07/01/2017  f/u ov/Tory Septer re: GOLD II copd/ maint bevespi Chief Complaint  Patient presents with  . Follow-up    Breathing has improved some. She has no new co's. She does not have a recue inhaler.   Dyspnea:  MMRC2 =  can't walk a nl pace on a flat grade s sob but does fine slow and flat   Cough: no problem Sleep: ok flate  SABA use:  None rec Please see patient coordinator before you leave today  to schedule refer to rehab> graduated mid August 2019     NP 12/09/17  Acute ov   Will check BMP and CBC will call with results Stay well hydrated Continue bevespi Continue current medications Get plenty of rest cxr c/w ? pna > augmentin x 7 days    01/03/2018  f/u ov/Mialynn Shelvin re: copd GOLD II Chief Complaint  Patient presents with  . Follow-up    Breathing has improved back to her normal baseline. She still has some cough with clear sputum.    Dyspnea:  MMRC2 = can't walk a nl pace on a flat grade s sob but does fine slow and flat   Cough: not much, am's only s truly excess/ purulent sputum or mucus plugs   Sleeping: flat bed, one pillow SABA use: none 02: none    rec Ok to use clariton or zyrtec over the counter  as needed for drainage  No change with bevespi  - if not happy bring back  you formulary and we will picky you an alternative    10/10/2018  f/u ov/Keywon Mestre re: GOLD II/ maint on bevespi  Chief Complaint  Patient presents with  . Follow-up    6 month for COPD   Dyspnea:  No change = MMRC2 = can't walk a nl pace on a flat grade s sob but does fine slow and flat   Cough: none Sleeping: ok flat one pillow SABA TMA:UQJF 02: none -sats low 90s even walking  rec Goal is to keep your 02 at above 90% while walking. Work on inhaler technique:   If not satisfied with bevespi, return with your drug formulary.  02/20/2019 - predniSONE (DELTASONE) 10 MG tablet; 4 tabs for 2 days, then 3 tabs for 2 days, 2 tabs for 2 days, then 1 tab for 2 days, then stop  Dispense: 20 tablet;   - doxycycline (VIBRA-TABS) 100 MG tablet; Take 1 tablet (100 mg total) by mouth 2 (two) times daily.  Dispense: 14 tablet;     Virtual Visit via Telephone Note 03/22/2019   I connected with [name] on 03/22/19 at 10:45 AM EST by telephone and verified that I am speaking with the correct person using two identifiers.   I discussed the limitations, risks, security and privacy concerns of performing an evaluation and management service by telephone and the availability of in person appointments. I also discussed with the patient that there may be a patient responsible charge related to this service. The patient expressed understanding and agreed to proceed.   History of Present Illness: Was better p pred, now worse sob / dry cough on maint bevespi 2 bid  Dyspnea:  MMRC2 = can't walk a nl pace on a flat grade s sob but does fine slow and flat  Cough: comes and goes  Sleeping: ok flat one pillow  SABA use: none  02: none    No obvious day to day or daytime variability or assoc excess/ purulent sputum or mucus plugs or hemoptysis or cp or chest tightness, subjective wheeze or overt sinus or hb symptoms.    Also denies any obvious fluctuation of symptoms with weather or environmental changes or  other aggravating or alleviating factors except as outlined above.   Meds reviewed/  med reconciliation completed        Observations/Objective: Good voice texture, no rattling or obvious congestion on voluntary cough   Assessment and Plan: See problem list for active a/p's   Follow Up Instructions: See avs for instructions unique to this ov which includes revised/ updated med list     I discussed the assessment and treatment plan with the patient. The patient was provided an opportunity to ask questions and all were answered. The patient agreed with the plan and demonstrated an understanding of the instructions.   The patient was advised to call back or seek an in-person evaluation if the symptoms worsen or if the condition fails to improve as anticipated.  I provided 25 minutes of non-face-to-face time during this encounter.   Sandrea Hughs, MD

## 2019-04-12 ENCOUNTER — Ambulatory Visit: Payer: Medicare Other | Admitting: Internal Medicine

## 2019-05-17 ENCOUNTER — Encounter: Payer: Self-pay | Admitting: Podiatry

## 2019-05-17 ENCOUNTER — Other Ambulatory Visit: Payer: Self-pay

## 2019-05-17 ENCOUNTER — Ambulatory Visit (INDEPENDENT_AMBULATORY_CARE_PROVIDER_SITE_OTHER): Payer: Medicare Other | Admitting: Podiatry

## 2019-05-17 DIAGNOSIS — B351 Tinea unguium: Secondary | ICD-10-CM | POA: Diagnosis not present

## 2019-05-17 DIAGNOSIS — M79674 Pain in right toe(s): Secondary | ICD-10-CM | POA: Diagnosis not present

## 2019-05-17 DIAGNOSIS — M79675 Pain in left toe(s): Secondary | ICD-10-CM | POA: Diagnosis not present

## 2019-05-17 NOTE — Progress Notes (Signed)
Complaint:  Visit Type: Patient returns to my office for continued preventative foot care services. Complaint: Patient states" my nails have grown long and thick and become painful to walk and wear shoes" The patient presents for preventative foot care services. No changes to ROS  Podiatric Exam: Vascular: dorsalis pedis  are palpable bilateral.  Posterior tibial pulses are absent  B/L. Capillary return is immediate. Temperature gradient is WNL. Skin turgor WNL  Sensorium: Normal Semmes Weinstein monofilament test. Normal tactile sensation bilaterally. Nail Exam: Pt has thick disfigured discolored nails with subungual debris noted bilateral entire nail hallux through fifth toenails.  Pincer nails  B/l Ulcer Exam: There is no evidence of ulcer or pre-ulcerative changes or infection. Orthopedic Exam: Muscle tone and strength are WNL. No limitations in general ROM. No crepitus or effusions noted. Foot type and digits show no abnormalities. Bony prominences are unremarkable. Skin: No Porokeratosis. No infection or ulcers  Diagnosis:  Onychomycosis, , Pain in right toe, pain in left toes  Treatment & Plan Procedures and Treatment: Consent by patient was obtained for treatment procedures.   Debridement of mycotic and hypertrophic toenails, 1 through 5 bilateral and clearing of subungual debris. No ulceration, no infection noted.  Return Visit-Office Procedure: Patient instructed to return to the office for a follow up visit 3 months for continued evaluation and treatment.    Helane Gunther DPM

## 2019-05-21 ENCOUNTER — Ambulatory Visit: Payer: Medicare Other

## 2019-05-28 NOTE — Progress Notes (Deleted)
Office Visit    Patient Name: [name] Date of Encounter: 05/28/2019  Primary Care Provider:  Lorene Dy, MD Primary Cardiologist:  P. Martinique, MD   Chief Complaint    79 year old female with a prior history of coronary artery disease status post anterior MI and LAD bare-metal stenting complicated by VF arrest in 2011, hypertension, hyperlipidemia, GERD, TIA,who presents for follow-up.  Past Medical History    Past Medical History:  Diagnosis Date  . Compression fracture    T9  . COPD (chronic obstructive pulmonary disease) ([place])   . Coronary artery disease 08/14/2009   a. 08/2009: Ant MI c/b v-fib arrest, s/p PTCA/BMS to LAD. (STENT Placement); b. 2013 Myoview: no ischemia; c. 11/2012 Cath: patent mLAD stent, RI 70 (stable), otw nonobs dzs, EF 65%;  d. 07/2016 Lexiscan MV: EF 56%, no ischemia/infarct.  Marland Kitchen GERD (gastroesophageal reflux disease)   . HTN (hypertension)   . Hyperlipidemia   . Ischemic cardiomyopathy    a. EF 40-45% in 08/2009 at time of MI. b. Improved to 55-60% by June 2011; c. 03/2016 Echo: EF 55-60%, no rwma, Gr1 DD, mild LVH.  . Osteoarthritis   . Osteopenia   . PONV (postoperative nausea and vomiting)   . Postoperative groin pseudoaneurysm    a. 04/2010 following diagnostic cardiac cath, s/p compression.  . Rib fractures    left sided second and sixth rib   . Shingles   . TIA (transient ischemic attack)    a. 03/2016 MRI/MRA negative/unremarkable.  . Ventricular fibrillation ([place])    a. 08/2009: due to anterior MI.   Past Surgical History:  Procedure Laterality Date  . APPENDECTOMY    . BACK SURGERY    . LEFT HEART CATHETERIZATION WITH CORONARY ANGIOGRAM N/A 11/16/2012   Procedure: LEFT HEART CATHETERIZATION WITH CORONARY ANGIOGRAM;  Surgeon: Burnell Blanks, MD;  Location: [facility] CATH LAB;  Service: Cardiovascular;  Laterality: N/A;  . LEG SURGERY     R low  . ORIF HIP FRACTURE    . TONSILLECTOMY      Allergies  Allergies  Allergen  Reactions  . Codeine Nausea And Vomiting and Other (See Comments)  . Imdur [Isosorbide Dinitrate] Nausea Only    Headache Headache  . Other Nausea And Vomiting  . Sulfa Antibiotics Other (See Comments)  . Sulfonamide Derivatives Nausea And Vomiting    History of Present Illness    79 year old female with the above complex past medical history. She is status post anterior MI and VF arrest in May 2011 with bare-metal stenting of the LAD. EF was 40-45% at that time but subsequently recovered.  Her last catheterization took place in August 2014 which revealed stable 70% stenosis within the ramus intermedius and a patent LAD stent. EF was 65% at that time. Other history includes hypertension, HL, ICM with subsequent Normalization of LV function, TIA in December 2017.  In April 2018 she had a  [employer] which showed no evidence of ischemia or infarct with normal LV function.   She does have dyspnea and is followed  by Dr. Melvyn Novas with GOLD stage 2 COPD. On inhaler therapy now.   On follow up today she is doing OK. States breathing is more difficult with the hot weather. No cough. No chest pain. No edema. Does a lot of waiting on her husband who gets around with a walker.   Home Medications    Allergies as of 06/01/2019      Reactions   Codeine Nausea  And Vomiting, Other (See Comments)   Imdur [isosorbide Dinitrate] Nausea Only   Headache Headache   Other Nausea And Vomiting   Sulfa Antibiotics Other (See Comments)   Sulfonamide Derivatives Nausea And Vomiting      Medication List       Accurate as of May 28, 2019  4:15 PM. If you have any questions, ask your nurse or doctor.        albuterol 108 (90 Base) MCG/ACT inhaler Commonly known as: ProAir HFA 2 puffs every 4 hours as needed only  if your can't catch your breath   aspirin EC 81 MG tablet Take 1 tablet (81 mg total) daily by mouth.   Bevespi Aerosphere 9-4.8 MCG/ACT Aero Generic drug:  Glycopyrrolate-Formoterol Inhale 2 puffs into the lungs 2 (two) times daily.   Breztri Aerosphere 160-9-4.8 MCG/ACT Aero Generic drug: Budeson-Glycopyrrol-Formoterol Inhale 2 puffs into the lungs 2 (two) times daily.   calcium carbonate 600 MG Tabs tablet Commonly known as: OS-CAL Take 600 mg by mouth 2 (two) times daily with a meal.   diclofenac sodium 1 % Gel Commonly known as: VOLTAREN Apply 2 grams to 4 grams to affected area up to 4 times daily PRN.   loratadine 10 MG tablet Commonly known as: CLARITIN Take 10 mg by mouth daily as needed.   losartan 50 MG tablet Commonly known as: COZAAR Take 1 tablet (50 mg total) by mouth daily.   metoprolol succinate 25 MG 24 hr tablet Commonly known as: TOPROL-XL Take 1 tablet by mouth daily.   nitroGLYCERIN 0.4 MG SL tablet Commonly known as: NITROSTAT Place 1 tablet (0.4 mg total) under the tongue every 5 (five) minutes as needed. For chest pain   pantoprazole 40 MG tablet Commonly known as: PROTONIX Take 1 tablet by mouth daily.   predniSONE 10 MG tablet Commonly known as: DELTASONE Take 4 for two days three for two days two for two days one for two days   RECLAST IV Inject into the vein. Once Yearly   rosuvastatin 20 MG tablet Commonly known as: CRESTOR Take 1 tablet (20 mg total) by mouth at bedtime.   traMADol 50 MG tablet Commonly known as: ULTRAM Take 50 mg by mouth every 6 (six) hours as needed.   Vitamin D3 25 MCG (1000 UT) Caps Take 1,000 Units by mouth daily.        Review of Systems    As noted in HPI. All other systems reviewed and are otherwise negative except as noted above.  Physical Exam    VS:  There were no vitals taken for this visit. , BMI There is no height or weight on file to calculate BMI. GENERAL:  Well appearing overweight WF in NAD HEENT:  PERRL, EOMI, sclera are clear. Oropharynx is clear. NECK:  No jugular venous distention, carotid upstroke brisk and symmetric, no bruits, no  thyromegaly or adenopathy LUNGS:  Scattered bilateral rhonchi CHEST:  Unremarkable HEART:  RRR,  PMI not displaced or sustained,S1 and S2 within normal limits, no S3, no S4: no clicks, no rubs, no murmurs ABD:  Soft, nontender. BS +, no masses or bruits. No hepatomegaly, no splenomegaly EXT:  2 + pulses throughout, no edema, no cyanosis no clubbing. Varicose veins. SKIN:  Warm and dry.  No rashes NEURO:  Alert and oriented x 3. Cranial nerves II through XII intact. PSYCH:  Cognitively intact        Accessory Clinical Findings     Laboratory data:  Lab Results  Component Value Date   WBC 7.1 11/28/2018   HGB 12.7 11/28/2018   HCT 37.8 11/28/2018   PLT 264 11/28/2018   GLUCOSE 80 11/28/2018   CHOL 158 03/17/2016   TRIG 72 03/17/2016   HDL 82 03/17/2016   LDLDIRECT 134.9 11/21/2007   LDLCALC 62 03/17/2016   ALT 12 11/28/2018   AST 19 11/28/2018   NA 134 (L) 11/28/2018   K 5.3 11/28/2018   CL 98 11/28/2018   CREATININE 0.77 11/28/2018   BUN 15 11/28/2018   CO2 29 11/28/2018   TSH 1.72 05/14/2017   INR 1.02 01/10/2018   HGBA1C 5.8 (H) 03/17/2016    Lexiscan Myoview-07/10/2016  The left ventricular ejection fraction is normal (55-65%). Nuclear stress EF: 56%. There was no ST segment deviation noted during stress. The study is normal. This is a low risk study.  Ecg today shows NSR with PACs rate 65. Low voltage. I have personally reviewed and interpreted this study.    Assessment & Plan    1.  Coronary artery disease: s/p anterior MI in 2011 with stenting of the LAD. Normal Myoview study in April 2018. Last Echo in 2017 showed normal LV function. She is asymptomatic. She has some dyspnea more related to COPD.  Continue medical therapy.   2. Essential hypertension: Blood pressure is well controlled.   3. Hyperlipidemia: She is on Crestor. LDL was 62 in December 2017. Labs followed by Dr Su Hilt. Goal LDL <70.  4. COPD. Followed by Dr. Sherene Sires.   Follow up in 6  months.  Tytus Strahle [place], MD,FACC 05/28/2019, 4:15 PM

## 2019-06-01 ENCOUNTER — Ambulatory Visit: Payer: Medicare Other | Admitting: Cardiology

## 2019-06-03 ENCOUNTER — Ambulatory Visit: Payer: Medicare Other | Attending: Internal Medicine

## 2019-06-03 DIAGNOSIS — Z23 Encounter for immunization: Secondary | ICD-10-CM

## 2019-06-03 NOTE — Progress Notes (Signed)
   Covid-19 Vaccination Clinic  Name:  NASHIRA MCGLYNN    MRN: [id number] DOB: [birth year]  06/03/2019  Ms. Cinquemani was observed post Covid-19 immunization for 15 minutes without incidence. She was provided with Vaccine Information Sheet and instruction to access the V-Safe system.   Ms. Cumbee was instructed to call 911 with any severe reactions post vaccine: Marland Kitchen Difficulty breathing  . Swelling of your face and throat  . A fast heartbeat  . A bad rash all over your body  . Dizziness and weakness    Immunizations Administered    Name Date Dose VIS Date Route   Pfizer COVID-19 Vaccine 06/03/2019  5:43 PM 0.3 mL 03/17/2019 Intramuscular   Manufacturer: [employer], [employer]   Lot: [id number]   NDC: [address]-5

## 2019-07-13 NOTE — Progress Notes (Signed)
Office Visit    Patient Name: [name] Date of Encounter: 07/14/2019  Primary Care Provider:  Lorene Dy, MD Primary Cardiologist:  P. Martinique, MD   Chief Complaint    79 year old female with a prior history of coronary artery disease status post anterior MI and LAD bare-metal stenting complicated by VF arrest in 2011, hypertension, hyperlipidemia, GERD, TIA,who presents for follow-up.  Past Medical History    Past Medical History:  Diagnosis Date  . Compression fracture    T9  . COPD (chronic obstructive pulmonary disease) ([place])   . Coronary artery disease 08/14/2009   a. 08/2009: Ant MI c/b v-fib arrest, s/p PTCA/BMS to LAD. (STENT Placement); b. 2013 Myoview: no ischemia; c. 11/2012 Cath: patent mLAD stent, RI 70 (stable), otw nonobs dzs, EF 65%;  d. 07/2016 Lexiscan MV: EF 56%, no ischemia/infarct.  Marland Kitchen GERD (gastroesophageal reflux disease)   . HTN (hypertension)   . Hyperlipidemia   . Ischemic cardiomyopathy    a. EF 40-45% in 08/2009 at time of MI. b. Improved to 55-60% by June 2011; c. 03/2016 Echo: EF 55-60%, no rwma, Gr1 DD, mild LVH.  . Osteoarthritis   . Osteopenia   . PONV (postoperative nausea and vomiting)   . Postoperative groin pseudoaneurysm    a. 04/2010 following diagnostic cardiac cath, s/p compression.  . Rib fractures    left sided second and sixth rib   . Shingles   . TIA (transient ischemic attack)    a. 03/2016 MRI/MRA negative/unremarkable.  . Ventricular fibrillation ([place])    a. 08/2009: due to anterior MI.   Past Surgical History:  Procedure Laterality Date  . APPENDECTOMY    . BACK SURGERY    . LEFT HEART CATHETERIZATION WITH CORONARY ANGIOGRAM N/A 11/16/2012   Procedure: LEFT HEART CATHETERIZATION WITH CORONARY ANGIOGRAM;  Surgeon: Burnell Blanks, MD;  Location: [facility] CATH LAB;  Service: Cardiovascular;  Laterality: N/A;  . LEG SURGERY     R low  . ORIF HIP FRACTURE    . TONSILLECTOMY      Allergies  Allergies  Allergen  Reactions  . Codeine Nausea And Vomiting and Other (See Comments)  . Imdur [Isosorbide Dinitrate] Nausea Only    Headache Headache  . Other Nausea And Vomiting  . Sulfa Antibiotics Other (See Comments)  . Sulfonamide Derivatives Nausea And Vomiting    History of Present Illness    79 year old female with the above complex past medical history. She is status post anterior MI and VF arrest in May 2011 with bare-metal stenting of the LAD. EF was 40-45% at that time but subsequently recovered.  Her last catheterization took place in August 2014 which revealed stable 70% stenosis within the ramus intermedius and a patent LAD stent. EF was 65% at that time. Other history includes hypertension, HL, ICM with subsequent Normalization of LV function, TIA in December 2017.  In April 2018 she had a  [employer] which showed no evidence of ischemia or infarct with normal LV function.   She does have dyspnea and is followed  by Dr. Melvyn Novas with GOLD stage 2 COPD. On inhaler therapy now.   On follow up today she is doing OK. She does have SOB which she attributes to her COPD. No chest pain.  No cough.  No edema. Some seasonal allergies. Had lab work with Dr Mancel Bale last month. She is moving to [place] next month to assisted living to be near family there.  Home Medications  Allergies as of 07/14/2019      Reactions   Codeine Nausea And Vomiting, Other (See Comments)   Imdur [isosorbide Dinitrate] Nausea Only   Headache Headache   Other Nausea And Vomiting   Sulfa Antibiotics Other (See Comments)   Sulfonamide Derivatives Nausea And Vomiting      Medication List       Accurate as of July 14, 2019  1:44 PM. If you have any questions, ask your nurse or doctor.        STOP taking these medications   Bevespi Aerosphere 9-4.8 MCG/ACT Aero Generic drug: Glycopyrrolate-Formoterol Stopped by: Nekoda Chock [place], MD   predniSONE 10 MG tablet Commonly known as: DELTASONE Stopped by: Judia Arnott [place],  MD     TAKE these medications   albuterol 108 (90 Base) MCG/ACT inhaler Commonly known as: ProAir HFA 2 puffs every 4 hours as needed only  if your can't catch your breath   aspirin EC 81 MG tablet Take 1 tablet (81 mg total) daily by mouth.   Breztri Aerosphere 160-9-4.8 MCG/ACT Aero Generic drug: Budeson-Glycopyrrol-Formoterol Inhale 2 puffs into the lungs 2 (two) times daily.   calcium carbonate 600 MG Tabs tablet Commonly known as: OS-CAL Take 600 mg by mouth 2 (two) times daily with a meal.   diclofenac sodium 1 % Gel Commonly known as: VOLTAREN Apply 2 grams to 4 grams to affected area up to 4 times daily PRN.   loratadine 10 MG tablet Commonly known as: CLARITIN Take 10 mg by mouth daily as needed.   losartan 50 MG tablet Commonly known as: COZAAR Take 1 tablet (50 mg total) by mouth daily.   metoprolol succinate 25 MG 24 hr tablet Commonly known as: TOPROL-XL Take 1 tablet by mouth daily.   nitroGLYCERIN 0.4 MG SL tablet Commonly known as: NITROSTAT Place 1 tablet (0.4 mg total) under the tongue every 5 (five) minutes as needed. For chest pain   pantoprazole 40 MG tablet Commonly known as: PROTONIX Take 1 tablet by mouth daily.   RECLAST IV Inject into the vein. Once Yearly   rosuvastatin 20 MG tablet Commonly known as: CRESTOR Take 1 tablet (20 mg total) by mouth at bedtime.   traMADol 50 MG tablet Commonly known as: ULTRAM Take 50 mg by mouth every 6 (six) hours as needed.   Vitamin D3 25 MCG (1000 UT) Caps Take 1,000 Units by mouth daily.        Review of Systems    As noted in HPI. All other systems reviewed and are otherwise negative except as noted above.  Physical Exam    VS:  BP 122/76   Pulse 74   Ht 5\' 5"  (1.651 m)   Wt 167 lb 3.2 oz (75.8 kg)   SpO2 94%   BMI 27.82 kg/m  , BMI Body mass index is 27.82 kg/m. GENERAL:  Well appearing overweight WF in NAD HEENT:  PERRL, EOMI, sclera are clear. Oropharynx is clear. NECK:  No  jugular venous distention, carotid upstroke brisk and symmetric, no bruits, no thyromegaly or adenopathy LUNGS:  Scattered bilateral rhonchi CHEST:  Unremarkable HEART:  RRR,  PMI not displaced or sustained,S1 and S2 within normal limits, no S3, no S4: no clicks, no rubs, no murmurs ABD:  Soft, nontender. BS +, no masses or bruits. No hepatomegaly, no splenomegaly EXT:  2 + pulses throughout, no edema, no cyanosis no clubbing. Varicose veins. SKIN:  Warm and dry.  No rashes NEURO:  Alert and oriented x 3.  Cranial nerves II through XII intact. PSYCH:  Cognitively intact        Accessory Clinical Findings     Laboratory data:  Lab Results  Component Value Date   WBC 7.1 11/28/2018   HGB 12.7 11/28/2018   HCT 37.8 11/28/2018   PLT 264 11/28/2018   GLUCOSE 80 11/28/2018   CHOL 158 03/17/2016   TRIG 72 03/17/2016   HDL 82 03/17/2016   LDLDIRECT 134.9 11/21/2007   LDLCALC 62 03/17/2016   ALT 12 11/28/2018   AST 19 11/28/2018   NA 134 (L) 11/28/2018   K 5.3 11/28/2018   CL 98 11/28/2018   CREATININE 0.77 11/28/2018   BUN 15 11/28/2018   CO2 29 11/28/2018   TSH 1.72 05/14/2017   INR 1.02 01/10/2018   HGBA1C 5.8 (H) 03/17/2016    Lexiscan Myoview-07/10/2016  The left ventricular ejection fraction is normal (55-65%). Nuclear stress EF: 56%. There was no ST segment deviation noted during stress. The study is normal. This is a low risk study.  Ecg today shows NSR with PACs rate 65. Low voltage. I have personally reviewed and interpreted this study.    Assessment & Plan    1.  Coronary artery disease: s/p anterior MI in 2011 with stenting of the LAD. Normal Myoview study in April 2018. Last Echo in 2017 showed normal LV function. She is asymptomatic. She has some dyspnea more related to COPD.  Continue medical therapy.   2. Essential hypertension: Blood pressure is well controlled.   3. Hyperlipidemia: She is on Crestor. Request a copy of recent lab work  4. COPD.  Followed by Dr. Sherene Sires.   Will establish follow up care in [place]   Kennie Snedden [place], MD,FACC 07/14/2019, 1:44 PM

## 2019-07-14 ENCOUNTER — Encounter: Payer: Self-pay | Admitting: Cardiology

## 2019-07-14 ENCOUNTER — Ambulatory Visit (INDEPENDENT_AMBULATORY_CARE_PROVIDER_SITE_OTHER): Payer: Medicare Other | Admitting: Cardiology

## 2019-07-14 ENCOUNTER — Other Ambulatory Visit: Payer: Self-pay

## 2019-07-14 VITALS — BP 122/76 | HR 74 | Ht 65.0 in | Wt 167.2 lb

## 2019-07-14 DIAGNOSIS — I10 Essential (primary) hypertension: Secondary | ICD-10-CM

## 2019-07-14 DIAGNOSIS — I2510 Atherosclerotic heart disease of native coronary artery without angina pectoris: Secondary | ICD-10-CM | POA: Diagnosis not present

## 2019-07-14 DIAGNOSIS — E7800 Pure hypercholesterolemia, unspecified: Secondary | ICD-10-CM | POA: Diagnosis not present

## 2019-08-15 ENCOUNTER — Ambulatory Visit (INDEPENDENT_AMBULATORY_CARE_PROVIDER_SITE_OTHER): Payer: Medicare Other | Admitting: Podiatry

## 2019-08-15 ENCOUNTER — Other Ambulatory Visit: Payer: Self-pay

## 2019-08-15 ENCOUNTER — Encounter: Payer: Self-pay | Admitting: Podiatry

## 2019-08-15 VITALS — Temp 95.5°F

## 2019-08-15 DIAGNOSIS — B351 Tinea unguium: Secondary | ICD-10-CM

## 2019-08-15 DIAGNOSIS — M79674 Pain in right toe(s): Secondary | ICD-10-CM | POA: Diagnosis not present

## 2019-08-15 DIAGNOSIS — M79675 Pain in left toe(s): Secondary | ICD-10-CM

## 2019-08-15 NOTE — Progress Notes (Signed)
This patient returns to the office for evaluation and treatment of long thick painful nails .  This patient is unable to trim his own nails since the patient cannot reach the feet.  Patient says the nails are painful walking and wearing his shoes.  He returns for preventive foot care services.  General Appearance  Alert, conversant and in no acute stress.  Vascular  Dorsalis pedis are palpable  bilaterally.  Posterior tibial pulses are absent  B/L. Capillary return is within normal limits  bilaterally. Temperature is within normal limits  bilaterally.  Neurologic  Senn-Weinstein monofilament wire test within normal limits  bilaterally. Muscle power within normal limits bilaterally.  Nails Thick disfigured discolored nails with subungual debris  from hallux to fifth toes bilaterally. No evidence of bacterial infection or drainage bilaterally. Pincer nails  B/L.  Orthopedic  No limitations of motion  feet .  No crepitus or effusions noted.  No bony pathology or digital deformities noted.  Skin  normotropic skin with no porokeratosis noted bilaterally.  No signs of infections or ulcers noted.     Onychomycosis  Pain in toes right foot  Pain in toes left foot  Debridement  of nails  1-5  B/L with a nail nipper.  Nails were then filed using a dremel tool with no incidents.    RTC   prn   Helane Gunther DPM

## 2019-11-21 ENCOUNTER — Telehealth: Payer: Self-pay | Admitting: Cardiology

## 2019-11-21 NOTE — Telephone Encounter (Signed)
Requesting office made aware that patient is no longer followed by our practice.

## 2019-11-21 NOTE — Telephone Encounter (Signed)
   This patient was previously followed by Dr. [place] and was last seen in clinic by Flavia Shipper, NP in 07/2019.  However, she has moved to [place] since that time.  She has been hospitalized in [place] for COPD exacerbation and was noted to have elevated cardiac enzymes at that time.  It would be most appropriate to obtain clearance for her upcoming surgery by her provider in [place].  Call back staff: Please contact surgeon's office and notify them that the clearance for surgery should come from her Cardiologist or PCP in [place]. Tereso Newcomer, PA-C    11/21/2019 1:20 PM

## 2019-11-21 NOTE — Telephone Encounter (Signed)
   [facility] Pre-operative Risk Assessment    HEARTCARE STAFF: - Please ensure there is not already an duplicate clearance open for this procedure. - Under Visit Info/Reason for Call, type in Other and utilize the format Clearance MM/DD/YY or Clearance TBD. Do not use dashes or single digits. - If request is for dental extraction, please clarify the # of teeth to be extracted.  Request for surgical clearance:  1. What type of surgery is being performed? Kyphoplasty   2. When is this surgery scheduled?  11/29/19  3. What type of clearance is required (medical clearance vs. Pharmacy clearance to hold med vs. Both)? Medical  4. Are there any medications that need to be held prior to surgery and how long? no  5. Practice name and name of physician performing surgery? Dr. Dennison Mascot, [facility]  6. What is the office phone number? [phone]   7.   What is the office fax number? [phone]  8.   Anesthesia type (None, local, MAC, general) ? general    Johnna Acosta 11/21/2019, 10:17 AM  _________________________________________________________________   (provider comments below)

## 2019-12-25 ENCOUNTER — Telehealth: Payer: Self-pay | Admitting: Pharmacist

## 2019-12-25 NOTE — Telephone Encounter (Signed)
Patient due for yearly Reclast Infusion. Prior infusions: 3/15, 4/16, 3/17, 5/19, 9/20.  She was previously treated with Forteo.   Last follow-up was a telemedicine visit in April 2020.  Patient last seen in office 01/2018.  Patient has history of right hip fracture and vertebral fractures.  She recently had compression fracture of thoracic vertebrae and surgery for kyphoplasty.  On 11/29/2019.    Last DEXA was 02/2017 showed T score -1.5 at AP spine, -2.7 at femoral neck left, and -4.2 at 1/3 distal radius.  Patient is due for repeat DEXA.  Okay to continue with Reclast infusion or wait pending DEXA results.   Verlin Fester, PharmD, [place], CPP Clinical Specialty Pharmacist (Rheumatology and Pulmonology)  12/25/2019 11:41 AM

## 2019-12-25 NOTE — Telephone Encounter (Signed)
Attempted to contact the patient and left message for patient to call the office.  

## 2019-12-25 NOTE — Telephone Encounter (Signed)
Patient states she is no longer living in [place]. Patient is now living in [place]. Patient will no longer be seen in our office. Patient advised to see her PCP. Patient advised to sign a release of records and we will send them to her PCP.

## 2019-12-25 NOTE — Telephone Encounter (Signed)
She will require updated DEXA and an office visit to discuss results prior to next reclast infusion.
